# Patient Record
Sex: Male | Born: 1960 | Hispanic: No | Marital: Married | Smoking: Former smoker
Health system: Southern US, Community
[De-identification: ages and names within clinical notes are randomized; demographics above are authoritative.]

## PROBLEM LIST (undated history)

## (undated) DIAGNOSIS — I10 Essential (primary) hypertension: Secondary | ICD-10-CM

## (undated) DIAGNOSIS — E119 Type 2 diabetes mellitus without complications: Secondary | ICD-10-CM

## (undated) DIAGNOSIS — I2510 Atherosclerotic heart disease of native coronary artery without angina pectoris: Secondary | ICD-10-CM

## (undated) HISTORY — PX: BLADDER SURGERY: SHX569

## (undated) HISTORY — PX: BACK SURGERY: SHX140

## (undated) HISTORY — PX: CORONARY ANGIOPLASTY WITH STENT PLACEMENT: SHX49

---

## 2016-01-21 ENCOUNTER — Emergency Department (HOSPITAL_COMMUNITY): Payer: BLUE CROSS/BLUE SHIELD

## 2016-01-21 ENCOUNTER — Encounter (HOSPITAL_COMMUNITY): Payer: Self-pay | Admitting: Emergency Medicine

## 2016-01-21 ENCOUNTER — Emergency Department (HOSPITAL_COMMUNITY)
Admission: EM | Admit: 2016-01-21 | Discharge: 2016-01-22 | Disposition: A | Discharge status: Home / Self Care | Payer: BLUE CROSS/BLUE SHIELD | Attending: Emergency Medicine | Admitting: Emergency Medicine

## 2016-01-21 DIAGNOSIS — S93511A Sprain of interphalangeal joint of right great toe, initial encounter: Secondary | ICD-10-CM | POA: Insufficient documentation

## 2016-01-21 DIAGNOSIS — Y939 Activity, unspecified: Secondary | ICD-10-CM | POA: Diagnosis not present

## 2016-01-21 DIAGNOSIS — Y990 Civilian activity done for income or pay: Secondary | ICD-10-CM | POA: Insufficient documentation

## 2016-01-21 DIAGNOSIS — Z87891 Personal history of nicotine dependence: Secondary | ICD-10-CM | POA: Diagnosis not present

## 2016-01-21 DIAGNOSIS — Y929 Unspecified place or not applicable: Secondary | ICD-10-CM | POA: Diagnosis not present

## 2016-01-21 DIAGNOSIS — X501XXA Overexertion from prolonged static or awkward postures, initial encounter: Secondary | ICD-10-CM | POA: Diagnosis not present

## 2016-01-21 DIAGNOSIS — S99921A Unspecified injury of right foot, initial encounter: Secondary | ICD-10-CM | POA: Diagnosis present

## 2016-01-21 NOTE — ED Triage Notes (Signed)
Pt c/o pain at base of right great toe.  Onset last pm. Denies any injury

## 2016-01-21 NOTE — ED Provider Notes (Signed)
[facility] Provider Note   CSN: [id number] Arrival date & time: 01/21/16  1955  By signing my name below, I, Modena Jansky, attest that this documentation has been prepared under the direction and in the presence of non-physician practitioner, Kerrie Buffalo, NP. Electronically Signed: Modena Jansky, Scribe. 01/21/2016. 11:23 PM.  History   Chief Complaint Chief Complaint  Patient presents with  . Toe Pain    Toe Pain  This is a new problem. The current episode started 12 to 24 hours ago. The problem occurs constantly. The problem has not changed since onset.Nothing aggravates the symptoms. The symptoms are relieved by acetaminophen. He has tried acetaminophen for the symptoms. The treatment provided mild relief.    HPI Comments: Anthony Huber is a 56 y.o. male who presents to the Emergency Department complaining of constant moderate right big toe pain that started last night. He states he bent his big toe at work. His pain is relieved by advil (last dose: noon) and exacerbated by movement. No associated symptoms. He admits to a prior hx of similar complaint due to injury as a child. He denies any leg swelling or other complaints.   History reviewed. No pertinent past medical history.  There are no active problems to display for this patient.   Past Surgical History:  Procedure Laterality Date  . BLADDER SURGERY         Home Medications    Prior to Admission medications   Medication Sig Start Date End Date Taking? Authorizing Provider  diclofenac (VOLTAREN) 50 MG EC tablet Take 1 tablet (50 mg total) by mouth 2 (two) times daily. 01/22/16   Lazarus Sudbury Orlene Och, NP    Family History No family history on file.  Social History Social History  Substance Use Topics  . Smoking status: Former Games developer  . Smokeless tobacco: Never Used  . Alcohol use No     Allergies   Patient has no allergy information on record.   Review of Systems Review of Systems  Constitutional: Negative  for fever.  Cardiovascular: Negative for leg swelling.  Musculoskeletal: Positive for arthralgias, joint swelling and myalgias.     Physical Exam Updated Vital Signs BP (!) 172/103 (BP Location: Left Arm)   Pulse 106   Temp 97.8 F (36.6 C) (Oral)   Resp 18   Ht 4\' 11"  (1.499 m)   Wt 106 lb 3 oz (48.2 kg)   SpO2 96%   BMI 21.45 kg/m   Physical Exam  Constitutional: He appears well-developed and well-nourished. No distress.  HENT:  Head: Normocephalic and atraumatic.  Eyes: Conjunctivae are normal.  Neck: Neck supple.  Cardiovascular: Normal rate.   Pedal pulses are +2. Adequate circulation.   Pulmonary/Chest: Effort normal.  Abdominal: Soft.  Musculoskeletal: Normal range of motion.       Right foot: There is tenderness. There is no swelling, normal capillary refill, no deformity and no laceration. Decreased range of motion: due to pain.       Feet:  Pedal pulse 2+, adequate circulation  Neurological: He is alert.  Skin: Skin is warm and dry.  Psychiatric: He has a normal mood and affect.  Nursing note and vitals reviewed.    ED Treatments / Results  DIAGNOSTIC STUDIES: Oxygen Saturation is 96% on RA, normal by my interpretation.    COORDINATION OF CARE: 11:26 PM- Pt advised of plan for treatment and pt agrees.  Labs (all labs ordered are listed, but only abnormal results are displayed) Labs Reviewed - No  data to display Procedures Procedures (including critical care time)  Medications Ordered in ED Medications - No data to display   Initial Impression / Assessment and Plan / ED Course  I have reviewed the triage vital signs and the nursing notes.  Pertinent labs & imaging results that were available during my care of the patient were reviewed by me and considered in my medical decision making (see chart for details).    Patient X-Ray negative for obvious fracture or dislocation. Pain managed in ED. Pt advised to follow up with orthopedics if symptoms  persist for possibility of missed fracture diagnosis. Patient given brace while in ED, conservative therapy recommended and discussed. Patient will be dc home & is agreeable with above plan.  Final Clinical Impressions(s) / ED Diagnoses   Final diagnoses:  Sprain of interphalangeal joint of right great toe, initial encounter    New Prescriptions Discharge Medication List as of 01/22/2016 12:35 AM    START taking these medications   Details  diclofenac (VOLTAREN) 50 MG EC tablet Take 1 tablet (50 mg total) by mouth 2 (two) times daily., Starting Wed 01/22/2016, Print      I personally performed the services described in this documentation, which was scribed in my presence. The recorded information has been reviewed and is accurate.     [address]Yana Schorr [place] Yannis Broce, NP 01/25/16 [phone]    Gilda Creasehristopher J Pollina, MD 02/01/16 [phone]

## 2016-01-21 NOTE — ED Notes (Signed)
NP at bedside.

## 2016-01-21 NOTE — ED Notes (Signed)
Patient transported to X-ray 

## 2016-01-22 MED ORDER — DICLOFENAC SODIUM 50 MG PO TBEC: 50.0000 mg | DELAYED_RELEASE_TABLET | Freq: Two times a day (BID) | ORAL | 0 refills | Status: DC

## 2016-01-22 NOTE — ED Notes (Signed)
Pt back in room. No distress observed. 

## 2016-01-22 NOTE — ED Notes (Signed)
Ortho paged. 

## 2016-01-22 NOTE — Discharge Instructions (Signed)
Your x-ray shows not broken bone in your toe or foot. You have most likely sprained the toe when your were bending and the to pressed back. Take the medication as directed for pain and inflammation. Return as needed for worsening symptoms

## 2016-09-05 ENCOUNTER — Encounter (HOSPITAL_COMMUNITY)
Admission: EM | Disposition: A | Discharge status: Home / Self Care | Payer: Self-pay | Source: Home / Self Care | Attending: Urology

## 2016-09-05 ENCOUNTER — Observation Stay (HOSPITAL_COMMUNITY): Payer: BLUE CROSS/BLUE SHIELD | Admitting: Certified Registered"

## 2016-09-05 ENCOUNTER — Inpatient Hospital Stay (HOSPITAL_COMMUNITY)
Admission: EM | Admit: 2016-09-05 | Discharge: 2016-09-09 | DRG: 872 | Disposition: A | Discharge status: Home / Self Care | Payer: BLUE CROSS/BLUE SHIELD | Attending: Urology | Admitting: Urology

## 2016-09-05 ENCOUNTER — Emergency Department (HOSPITAL_COMMUNITY): Payer: BLUE CROSS/BLUE SHIELD

## 2016-09-05 ENCOUNTER — Observation Stay (HOSPITAL_COMMUNITY): Payer: BLUE CROSS/BLUE SHIELD

## 2016-09-05 ENCOUNTER — Encounter (HOSPITAL_COMMUNITY): Payer: Self-pay | Admitting: *Deleted

## 2016-09-05 DIAGNOSIS — E119 Type 2 diabetes mellitus without complications: Secondary | ICD-10-CM | POA: Diagnosis present

## 2016-09-05 DIAGNOSIS — I10 Essential (primary) hypertension: Secondary | ICD-10-CM | POA: Diagnosis present

## 2016-09-05 DIAGNOSIS — I2510 Atherosclerotic heart disease of native coronary artery without angina pectoris: Secondary | ICD-10-CM | POA: Diagnosis present

## 2016-09-05 DIAGNOSIS — Z955 Presence of coronary angioplasty implant and graft: Secondary | ICD-10-CM

## 2016-09-05 DIAGNOSIS — Z79899 Other long term (current) drug therapy: Secondary | ICD-10-CM

## 2016-09-05 DIAGNOSIS — A419 Sepsis, unspecified organism: Secondary | ICD-10-CM | POA: Diagnosis not present

## 2016-09-05 DIAGNOSIS — N136 Pyonephrosis: Secondary | ICD-10-CM | POA: Diagnosis present

## 2016-09-05 DIAGNOSIS — N200 Calculus of kidney: Secondary | ICD-10-CM

## 2016-09-05 DIAGNOSIS — Z87442 Personal history of urinary calculi: Secondary | ICD-10-CM

## 2016-09-05 HISTORY — DX: Type 2 diabetes mellitus without complications: E11.9

## 2016-09-05 HISTORY — PX: CYSTOSCOPY WITH STENT PLACEMENT: SHX5790

## 2016-09-05 HISTORY — DX: Essential (primary) hypertension: I10

## 2016-09-05 HISTORY — DX: Atherosclerotic heart disease of native coronary artery without angina pectoris: I25.10

## 2016-09-05 LAB — CBC WITH DIFFERENTIAL/PLATELET
Basophils Absolute: 0 10*3/uL (ref 0.0–0.1)
Basophils Relative: 0 %
EOS PCT: 5 %
Eosinophils Absolute: 0.7 10*3/uL (ref 0.0–0.7)
HEMATOCRIT: 34.7 % — AB (ref 39.0–52.0)
Hemoglobin: 11.5 g/dL — ABNORMAL LOW (ref 13.0–17.0)
LYMPHS ABS: 1.5 10*3/uL (ref 0.7–4.0)
LYMPHS PCT: 11 %
MCH: 21.3 pg — AB (ref 26.0–34.0)
MCHC: 33.1 g/dL (ref 30.0–36.0)
MCV: 64.1 fL — AB (ref 78.0–100.0)
Monocytes Absolute: 1.3 10*3/uL — ABNORMAL HIGH (ref 0.1–1.0)
Monocytes Relative: 10 %
Neutro Abs: 10 10*3/uL — ABNORMAL HIGH (ref 1.7–7.7)
Neutrophils Relative %: 74 %
PLATELETS: 524 10*3/uL — AB (ref 150–400)
RBC: 5.41 MIL/uL (ref 4.22–5.81)
RDW: 15.4 % (ref 11.5–15.5)
WBC: 13.5 10*3/uL — AB (ref 4.0–10.5)

## 2016-09-05 LAB — URINALYSIS, ROUTINE W REFLEX MICROSCOPIC
Bilirubin Urine: NEGATIVE
Glucose, UA: NEGATIVE mg/dL
Ketones, ur: NEGATIVE mg/dL
Nitrite: NEGATIVE
PH: 6 (ref 5.0–8.0)
Protein, ur: NEGATIVE mg/dL
SPECIFIC GRAVITY, URINE: 1.01 (ref 1.005–1.030)
SQUAMOUS EPITHELIAL / LPF: NONE SEEN

## 2016-09-05 LAB — COMPREHENSIVE METABOLIC PANEL
ALK PHOS: 74 U/L (ref 38–126)
ALT: 11 U/L — AB (ref 17–63)
AST: 33 U/L (ref 15–41)
Albumin: 2.7 g/dL — ABNORMAL LOW (ref 3.5–5.0)
Anion gap: 10 (ref 5–15)
BILIRUBIN TOTAL: 0.4 mg/dL (ref 0.3–1.2)
BUN: 12 mg/dL (ref 6–20)
CALCIUM: 8.8 mg/dL — AB (ref 8.9–10.3)
CO2: 23 mmol/L (ref 22–32)
CREATININE: 1.43 mg/dL — AB (ref 0.61–1.24)
Chloride: 102 mmol/L (ref 101–111)
GFR, EST NON AFRICAN AMERICAN: 53 mL/min — AB (ref >60)
Glucose, Bld: 127 mg/dL — ABNORMAL HIGH (ref 65–99)
Potassium: 4.2 mmol/L (ref 3.5–5.1)
Sodium: 135 mmol/L (ref 135–145)
TOTAL PROTEIN: 8.3 g/dL — AB (ref 6.5–8.1)

## 2016-09-05 LAB — GLUCOSE, CAPILLARY: GLUCOSE-CAPILLARY: 119 mg/dL — AB (ref 65–99)

## 2016-09-05 LAB — I-STAT CG4 LACTIC ACID, ED: LACTIC ACID, VENOUS: 1.15 mmol/L (ref 0.5–1.9)

## 2016-09-05 SURGERY — CYSTOSCOPY, WITH STENT INSERTION
Anesthesia: General | Laterality: Left

## 2016-09-05 MED ORDER — CIPROFLOXACIN HCL 500 MG PO TABS: 500.0000 mg | ORAL_TABLET | Freq: Two times a day (BID) | ORAL | 0 refills | Status: DC

## 2016-09-05 MED ORDER — VANCOMYCIN HCL IN DEXTROSE 750-5 MG/150ML-% IV SOLN
750.0000 mg | INTRAVENOUS | Status: DC
  Administered 2016-09-06 – 2016-09-07 (×2): 750 mg via INTRAVENOUS
  Filled 2016-09-05 (×3): qty 150

## 2016-09-05 MED ORDER — SODIUM CHLORIDE 0.9 % IV BOLUS (SEPSIS)
1000.0000 mL | Freq: Once | INTRAVENOUS | Status: AC
  Administered 2016-09-05: 1000 mL via INTRAVENOUS

## 2016-09-05 MED ORDER — TRAMADOL HCL 50 MG PO TABS: 50.0000 mg | ORAL_TABLET | Freq: Four times a day (QID) | ORAL | 0 refills | Status: DC | PRN

## 2016-09-05 MED ORDER — PROPOFOL 10 MG/ML IV BOLUS
INTRAVENOUS | Status: DC | PRN
  Administered 2016-09-05: 100 mg via INTRAVENOUS

## 2016-09-05 MED ORDER — PIPERACILLIN-TAZOBACTAM 3.375 G IVPB
3.3750 g | INTRAVENOUS | Status: AC
  Administered 2016-09-05: 3.375 g via INTRAVENOUS
  Filled 2016-09-05: qty 50

## 2016-09-05 MED ORDER — SODIUM CHLORIDE 0.9 % IV SOLN
INTRAVENOUS | Status: DC | PRN
  Administered 2016-09-05: 30 mL

## 2016-09-05 MED ORDER — DEXAMETHASONE SODIUM PHOSPHATE 10 MG/ML IJ SOLN
INTRAMUSCULAR | Status: DC | PRN
  Administered 2016-09-05: 5 mg via INTRAVENOUS

## 2016-09-05 MED ORDER — FENTANYL CITRATE (PF) 100 MCG/2ML IJ SOLN: 25.0000 ug | INTRAMUSCULAR | Status: DC | PRN

## 2016-09-05 MED ORDER — PHENYLEPHRINE 40 MCG/ML (10ML) SYRINGE FOR IV PUSH (FOR BLOOD PRESSURE SUPPORT)
PREFILLED_SYRINGE | INTRAVENOUS | Status: DC | PRN
  Administered 2016-09-05 (×2): 80 ug via INTRAVENOUS
  Administered 2016-09-05: 120 ug via INTRAVENOUS
  Administered 2016-09-05 (×4): 80 ug via INTRAVENOUS

## 2016-09-05 MED ORDER — OXYCODONE HCL 5 MG/5ML PO SOLN
5.0000 mg | Freq: Once | ORAL | Status: DC | PRN
  Filled 2016-09-05: qty 5

## 2016-09-05 MED ORDER — LIDOCAINE 2% (20 MG/ML) 5 ML SYRINGE
INTRAMUSCULAR | Status: DC | PRN
  Administered 2016-09-05: 60 mg via INTRAVENOUS

## 2016-09-05 MED ORDER — IOPAMIDOL (ISOVUE-300) INJECTION 61%
INTRAVENOUS | Status: AC
  Filled 2016-09-05: qty 50

## 2016-09-05 MED ORDER — FENTANYL CITRATE (PF) 100 MCG/2ML IJ SOLN
INTRAMUSCULAR | Status: DC | PRN
  Administered 2016-09-05 (×4): 25 ug via INTRAVENOUS

## 2016-09-05 MED ORDER — MIDAZOLAM HCL 2 MG/2ML IJ SOLN
INTRAMUSCULAR | Status: DC | PRN
  Administered 2016-09-05 (×2): 1 mg via INTRAVENOUS

## 2016-09-05 MED ORDER — PIPERACILLIN-TAZOBACTAM 3.375 G IVPB
3.3750 g | Freq: Three times a day (TID) | INTRAVENOUS | Status: DC
  Administered 2016-09-06 – 2016-09-08 (×7): 3.375 g via INTRAVENOUS
  Filled 2016-09-05 (×7): qty 50

## 2016-09-05 MED ORDER — ONDANSETRON HCL 4 MG/2ML IJ SOLN
INTRAMUSCULAR | Status: DC | PRN
  Administered 2016-09-05: 4 mg via INTRAVENOUS

## 2016-09-05 MED ORDER — ONDANSETRON HCL 4 MG/2ML IJ SOLN: 4.0000 mg | Freq: Four times a day (QID) | INTRAMUSCULAR | Status: DC | PRN

## 2016-09-05 MED ORDER — LACTATED RINGERS IV SOLN
INTRAVENOUS | Status: DC | PRN
  Administered 2016-09-05: 22:00:00 via INTRAVENOUS

## 2016-09-05 MED ORDER — STERILE WATER FOR IRRIGATION IR SOLN
Status: DC | PRN
  Administered 2016-09-05: 1000 mL
  Administered 2016-09-05: 3000 mL
  Administered 2016-09-05: 1000 mL

## 2016-09-05 MED ORDER — SODIUM CHLORIDE 0.9 % IV SOLN
1000.0000 mL | INTRAVENOUS | Status: DC
  Administered 2016-09-05: 1000 mL via INTRAVENOUS

## 2016-09-05 MED ORDER — VANCOMYCIN HCL IN DEXTROSE 1-5 GM/200ML-% IV SOLN: 1000.0000 mg | INTRAVENOUS | Status: DC

## 2016-09-05 MED ORDER — OXYCODONE HCL 5 MG PO TABS: 5.0000 mg | ORAL_TABLET | Freq: Once | ORAL | Status: DC | PRN

## 2016-09-05 MED ORDER — VANCOMYCIN HCL IN DEXTROSE 1-5 GM/200ML-% IV SOLN
1000.0000 mg | Freq: Once | INTRAVENOUS | Status: AC
  Administered 2016-09-05: 1000 mg via INTRAVENOUS
  Filled 2016-09-05: qty 200

## 2016-09-05 SURGICAL SUPPLY — 15 items
BAG URO CATCHER STRL LF (MISCELLANEOUS) ×3 IMPLANT
CATH INTERMIT  6FR 70CM (CATHETERS) ×3 IMPLANT
CATH URET 5FR 28IN OPEN ENDED (CATHETERS) ×3 IMPLANT
CLOTH BEACON ORANGE TIMEOUT ST (SAFETY) ×3 IMPLANT
COVER FOOTSWITCH UNIV (MISCELLANEOUS) IMPLANT
COVER SURGICAL LIGHT HANDLE (MISCELLANEOUS) ×3 IMPLANT
GLOVE BIOGEL M STRL SZ7.5 (GLOVE) ×3 IMPLANT
GOWN STRL REUS W/TWL LRG LVL3 (GOWN DISPOSABLE) ×6 IMPLANT
GUIDEWIRE STR DUAL SENSOR (WIRE) ×3 IMPLANT
MANIFOLD NEPTUNE II (INSTRUMENTS) ×3 IMPLANT
PACK CYSTO (CUSTOM PROCEDURE TRAY) ×3 IMPLANT
STENT URET 6FRX24 CONTOUR (STENTS) ×3 IMPLANT
TRAY FOLEY W/METER SILVER 16FR (SET/KITS/TRAYS/PACK) ×3 IMPLANT
TUBING CONNECTING 10 (TUBING) IMPLANT
TUBING CONNECTING 10' (TUBING)

## 2016-09-05 NOTE — Anesthesia Preprocedure Evaluation (Addendum)
Anesthesia Evaluation  Patient identified by MRN, date of birth, ID band Patient awake    Reviewed: Allergy & Precautions, H&P , NPO status , Patient's Chart, lab work & pertinent test results  Airway Mallampati: II   Neck ROM: full    Dental  (+) Dental Advisory Given, Chipped, Poor Dentition   Pulmonary former smoker,    breath sounds clear to auscultation       Cardiovascular hypertension,  Rhythm:regular Rate:Normal  Pt denies HTN and denies heart problems   Neuro/Psych    GI/Hepatic   Endo/Other  diabetesPt denies h/o DM  Renal/GU Renal diseaseH/o stones   H/o prostate surgery in [place]    Musculoskeletal   Abdominal   Peds  Hematology   Anesthesia Other Findings   Reproductive/Obstetrics                            Anesthesia Physical Anesthesia Plan  ASA: II  Anesthesia Plan: General   Post-op Pain Management:    Induction: Intravenous  PONV Risk Score and Plan: 2 and Ondansetron, Dexamethasone, Treatment may vary due to age or medical condition and Midazolam  Airway Management Planned: LMA  Additional Equipment:   Intra-op Plan:   Post-operative Plan:   Informed Consent: I have reviewed the patients History and Physical, chart, labs and discussed the procedure including the risks, benefits and alternatives for the proposed anesthesia with the patient or authorized representative who has indicated his/her understanding and acceptance.     Plan Discussed with: CRNA, Anesthesiologist and Surgeon  Anesthesia Plan Comments:         Anesthesia Quick Evaluation

## 2016-09-05 NOTE — ED Provider Notes (Signed)
[facility] Provider Note   CSN: [id number] Arrival date & time: 09/05/16  1210     History   Chief Complaint No chief complaint on file.   HPI Anthony Huber is a 56 y.o. male presenting with left-sided flank pain.  Level V caveat, as patient does not speak [place], and no interpreter available with this interpreter service. Son acting as a Nurse, learning disability.  For the past 2 weeks, patient has had left-sided flank pain. Last week, he became sick, with fevers chills and vomiting. He saw his primary care doctor and was put on antibiotics and sent for outpatient xray. Results showed a kidney stone. Patient was told to come the emergency room to get the stone removed. Son states he has a history of kidney stones requiring surgical extraction in [place]. Currently, patient denies fever, chills, nausea, or vomiting. He denies urinary symptoms including blood in the urine, dysuria, or increased frequency. He denies worsening pain with urination. He has been taking antibiotics as prescribed and naproxen for pain. The pain is constant, and naproxen makes it better. Nothing makes it worse. He denies chest pain, shortness of breath, cough, anterior abdominal pain, or abnormal bowel movements. He denies other medical history. Patient is not on blood thinners.  Patient last ate at 11:00 this morning.  HPI  Past Medical History:  Diagnosis Date  . Coronary artery disease   . Diabetes mellitus without complication (HCC)   . Hypertension     Patient Active Problem List   Diagnosis Date Noted  . Nephrolithiasis 09/05/2016    Past Surgical History:  Procedure Laterality Date  . BACK SURGERY    . BLADDER SURGERY    . CORONARY ANGIOPLASTY WITH STENT PLACEMENT         Home Medications    Prior to Admission medications   Medication Sig Start Date End Date Taking? Authorizing Provider  amoxicillin (AMOXIL) 500 MG capsule Take 1,000 mg by mouth 2 (two) times daily. 08/31/16  Yes [provider]  clarithromycin (BIAXIN) 500 MG tablet Take 500 mg by mouth 2 (two) times daily. 08/31/16  Yes [provider]  naproxen (NAPROSYN) 500 MG tablet Take 500 mg by mouth 2 (two) times daily as needed for pain. 08/26/16  Yes [provider]  omeprazole (PRILOSEC) 20 MG capsule Take 20 mg by mouth 2 (two) times daily. 08/31/16  Yes [provider]  pantoprazole (PROTONIX) 40 MG tablet Take 40 mg by mouth daily. 08/26/16  Yes [provider]  diclofenac (VOLTAREN) 50 MG EC tablet Take 1 tablet (50 mg total) by mouth 2 (two) times daily. Patient not taking: Reported on 09/05/2016 01/22/16   Janne Napoleon, NP    Family History No family history on file.  Social History Social History  Substance Use Topics  . Smoking status: Former Games developer  . Smokeless tobacco: Never Used  . Alcohol use No     Allergies   Patient has no active allergies.   Review of Systems Review of Systems  Unable to perform ROS: Other  Constitutional: Positive for fever (Resolved).  Gastrointestinal: Positive for vomiting (Resolved).  Genitourinary: Positive for flank pain.  All other systems reviewed and are negative. Further ROS unable to be performed due to language barrier.   Physical Exam Updated Vital Signs BP 139/89   Pulse 87   Temp 98.3 F (36.8 C) (Oral)   Resp 14   Ht 4\' 11"  (1.499 m)   Wt 44.3 kg (97 lb 9.6 oz)  SpO2 100%   BMI 19.71 kg/m   Physical Exam  Constitutional: He is oriented to person, place, and time. He appears well-developed and well-nourished. No distress.  HENT:  Head: Normocephalic and atraumatic.  Eyes: EOM are normal.  Neck: Normal range of motion.  Cardiovascular: Normal rate, regular rhythm and intact distal pulses.   Pulmonary/Chest: Effort normal and breath sounds normal. No respiratory distress. He has no decreased breath sounds. He has no wheezes. He has no rhonchi. He has no rales.  Abdominal: Soft. Bowel sounds are  normal. He exhibits no distension. There is tenderness. There is no rigidity, no rebound, no guarding, no CVA tenderness, no tenderness at McBurney's point and negative Murphy's sign.  Tenderness to palpation of the left side. 4" x 2" area and erythema and warmth. No fluctuance. Erythema is superimposing site of old surgical scars.  Musculoskeletal: Normal range of motion.  Neurological: He is alert and oriented to person, place, and time.  Skin: Skin is warm and dry. He is not diaphoretic.  Psychiatric: He has a normal mood and affect.  Nursing note and vitals reviewed.    ED Treatments / Results  Labs (all labs ordered are listed, but only abnormal results are displayed) Labs Reviewed  URINALYSIS, ROUTINE W REFLEX MICROSCOPIC - Abnormal; Notable for the following:       Result Value   APPearance CLOUDY (*)    Hgb urine dipstick SMALL (*)    Leukocytes, UA LARGE (*)    Bacteria, UA RARE (*)    All other components within normal limits  CBC WITH DIFFERENTIAL/PLATELET - Abnormal; Notable for the following:    WBC 13.5 (*)    Hemoglobin 11.5 (*)    HCT 34.7 (*)    MCV 64.1 (*)    MCH 21.3 (*)    Platelets 524 (*)    Neutro Abs 10.0 (*)    Monocytes Absolute 1.3 (*)    All other components within normal limits  COMPREHENSIVE METABOLIC PANEL - Abnormal; Notable for the following:    Glucose, Bld 127 (*)    Creatinine, Ser 1.43 (*)    Calcium 8.8 (*)    Total Protein 8.3 (*)    Albumin 2.7 (*)    ALT 11 (*)    GFR calc non Af Amer 53 (*)    All other components within normal limits  CULTURE, BLOOD (ROUTINE X 2)  CULTURE, BLOOD (ROUTINE X 2)  URINE CULTURE  I-STAT CG4 LACTIC ACID, ED    EKG  EKG Interpretation None       Radiology Ct Renal Stone Study  Result Date: 09/05/2016 CLINICAL DATA:  Flank pain.  Stone disease suspected. EXAM: CT ABDOMEN AND PELVIS WITHOUT CONTRAST TECHNIQUE: Multidetector CT imaging of the abdomen and pelvis was performed following the  standard protocol without IV contrast. COMPARISON:  None. FINDINGS: Motion degraded scan. Lower chest: Mild scattered varicoid bronchiectasis at both lung bases. Hepatobiliary: Normal liver with no liver mass. Normal gallbladder with no radiopaque cholelithiasis. No biliary ductal dilatation. Pancreas: Normal, with no mass or duct dilation. Spleen: Normal size. No mass. Adrenals/Urinary Tract: Normal adrenals. Obstructing 20 x 9 mm distal left ureteral stone just above the left ureterovesical junction with severe left hydroureteronephrosis. Nonobstructing 8 mm and 2 mm lower left renal stones. Severe diffuse left renal parenchymal atrophy. No right hydronephrosis. Nonobstructing 2 mm lower right renal stone. Normal caliber right ureter, with no right ureteral stones. No contour deforming renal masses. Normal bladder. Stomach/Bowel: Grossly normal  stomach. Normal caliber small bowel with no small bowel wall thickening. Normal appendix. Normal large bowel with no diverticulosis, large bowel wall thickening or pericolonic fat stranding. Vascular/Lymphatic: Atherosclerotic nonaneurysmal abdominal aorta. No pathologically enlarged lymph nodes in the abdomen or pelvis. Reproductive: Normal size prostate with coarse nonspecific internal prostatic calcifications. Other: No pneumoperitoneum, ascites or focal fluid collection. Musculoskeletal: No aggressive appearing focal osseous lesions. Mild thoracolumbar spondylosis. Incompletely healed chronic appearing deformities of the pubic symphysis bilaterally. IMPRESSION: 1. Obstructing 20 x 9 mm distal left ureteral stone just above the left UVJ, with severe left hydroureteronephrosis. This is probably a chronic obstruction given severe diffuse asymmetric left renal parenchymal atrophy . 2. Additional nonobstructing stones in both kidneys. 3. 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 4.  Aortic Atherosclerosis (ICD10-I70.0). Electronically Signed   By: Delbert Phenix M.D.   On: 09/05/2016 16:49    Procedures Procedures (including critical care time)  Medications Ordered in ED Medications  sodium chloride 0.9 % bolus 1,000 mL (0 mLs Intravenous Stopped 09/05/16 1707)    Followed by  0.9 %  sodium chloride infusion (0 mLs Intravenous Stopped 09/05/16 1823)  iopamidol (ISOVUE-300) 61 % injection (not administered)  vancomycin (VANCOCIN) IVPB 1000 mg/200 mL premix (1,000 mg Intravenous New Bag/Given 09/05/16 1827)  piperacillin-tazobactam (ZOSYN) IVPB 3.375 g (not administered)  vancomycin (VANCOCIN) IVPB 750 mg/150 ml premix (not administered)  piperacillin-tazobactam (ZOSYN) IVPB 3.375 g (0 g Intravenous Stopped 09/05/16 1824)     Initial Impression / Assessment and Plan / ED Course  I have reviewed the triage vital signs and the nursing notes.  Pertinent labs & imaging results that were available during my care of the patient were reviewed by me and considered in my medical decision making (see chart for details).     Pt presenting with 2 weeks of left flank pain. History of kidney stones, and outpatient radiology stated the patient has a stone. Radiology was done in [place], and images are not in the system. Physical exam shows area of redness and tenderness of the left side, superimposing old surgical scars. Will order CBC, CMP, UA, blood cultures. CT renal ordered. Will start IV fluid.  UA shows positive leuks and too numerous to count white cells. Will send for urine culture. White count elevated at 13.5. Creatinine 1.4, no baseline to compare. CT showed 20 x 9 mm obstructing stone at the UVJ with severe left hydronephrosis. IV Vanc and Zosyn started. Discussed case with attending, Dr. Rosalia Hammers evaluated the patient. Will consult with urology.  Discussed case with Dr. Marlou Porch with urology. Patient to be admitted by urology to [facility] for further management.  Final Clinical Impressions(s) / ED Diagnoses   Final diagnoses:    Nephrolithiasis    New Prescriptions New Prescriptions   No medications on file     Alveria Apley, PA-C 09/05/16 1900    Margarita Grizzle, MD 09/05/16 [phone]

## 2016-09-05 NOTE — ED Notes (Signed)
Patient transported to CT 

## 2016-09-05 NOTE — Anesthesia Procedure Notes (Signed)
Date/Time: 09/05/2016 11:02 PM Performed by: Minerva EndsMIRARCHI, Daire Okimoto M Pre-anesthesia Checklist: Suction available, Emergency Drugs available, Patient identified and Patient being monitored Oxygen Delivery Method: Simple face mask Dental Injury: Teeth and Oropharynx as per pre-operative assessment  Comments: LMA removed OA inserted and face mask applied--- good AW to PACU O 2 intact

## 2016-09-05 NOTE — Consult Note (Signed)
I have been asked to see the patient by Dr. Antony Salmonaniela Ray, for evaluation and management of left distal ureteral stone.  History of present illness: 89M who presented to the ED with left sided flank pain.  He had no nausea.  He denies any fevers.  He has been having flank pain for the last two weeks.  Seen by his PCP and started on abx for fever and concern of a UTI.  He has not had any voiding symptoms.  He has a history of nephrolithiasis.  He has had two operation on his left kidney to remove stones.  He has also had a prostate surgery.  In the ED he was noted to have UA with large WBCs, his serum WBC was 13K and his CT scan showed a 2cm x 0.9cm stone in the left distal ureter.  His kidney was dilated  Review of systems: A 12 point comprehensive review of systems was obtained and is negative unless otherwise stated in the history of present illness.  Patient Active Problem List   Diagnosis Date Noted  . Nephrolithiasis 09/05/2016    No current facility-administered medications on file prior to encounter.    Current Outpatient Prescriptions on File Prior to Encounter  Medication Sig Dispense Refill  . diclofenac (VOLTAREN) 50 MG EC tablet Take 1 tablet (50 mg total) by mouth 2 (two) times daily. (Patient not taking: Reported on 09/05/2016) 15 tablet 0    Past Medical History:  Diagnosis Date  . Coronary artery disease   . Diabetes mellitus without complication (HCC)   . Hypertension     Past Surgical History:  Procedure Laterality Date  . BACK SURGERY    . BLADDER SURGERY    . CORONARY ANGIOPLASTY WITH STENT PLACEMENT      Social History  Substance Use Topics  . Smoking status: Former Games developermoker  . Smokeless tobacco: Never Used  . Alcohol use No    History reviewed. No pertinent family history.  PE: Vitals:   09/05/16 1800 09/05/16 1915 09/05/16 2030 09/05/16 2042  BP: 139/89 125/77 133/86 138/86  Pulse: 87 80 83 86  Resp: 14   16  Temp:      TempSrc:      SpO2: 100%  99% 99% 100%  Weight:      Height:       Patient appears to be in no acute distress  patient is alert and oriented x3 Atraumatic normocephalic head No cervical or supraclavicular lymphadenopathy appreciated No increased work of breathing, no audible wheezes/rhonchi Regular sinus rhythm/rate Abdomen is soft, nontender, nondistended, left CVA tenderness Lower extremities are symmetric without appreciable edema Grossly neurologically intact No identifiable skin lesions   Recent Labs  09/05/16 1600  WBC 13.5*  HGB 11.5*  HCT 34.7*    Recent Labs  09/05/16 1600  NA 135  K 4.2  CL 102  CO2 23  GLUCOSE 127*  BUN 12  CREATININE 1.43*  CALCIUM 8.8*   No results for input(s): LABPT, INR in the last 72 hours. No results for input(s): LABURIN in the last 72 hours. No results found for this or any previous visit.  Imaging: I have independently reviewed the CT scan- this shows a large distal left sided stone with hydronephrosis.  His kidney looks chronically dilated and atrophied.  Imp: Left sided distal obstructing stone with UA concerning for possible infection. Recommendations: Plan to take patient to the OR for stent placement tonight.  He will then follow-up for ureteroscopy in a  few weeks.  It would also be worth a renogram to ascertain how function the left kidney is.  However, regardless I think his left distal ureteral stone needs to be removed.   Plan to admit after the surgery for observation and discharge in the AM if he remains stable.

## 2016-09-05 NOTE — ED Notes (Signed)
[facility] then room [id number],  Dr. Berniece SalinesBenjamin Herrick accepting

## 2016-09-05 NOTE — Anesthesia Procedure Notes (Signed)
Procedure Name: LMA Insertion Date/Time: 09/05/2016 9:54 PM Performed by: Minerva EndsMIRARCHI, Shahin Knierim M Pre-anesthesia Checklist: Patient identified, Emergency Drugs available, Suction available and Patient being monitored Patient Re-evaluated:Patient Re-evaluated prior to induction Oxygen Delivery Method: Circle System Utilized Preoxygenation: Pre-oxygenation with 100% oxygen Induction Type: IV induction Ventilation: Mask ventilation without difficulty LMA: LMA inserted LMA Size: 3.0 Tube type: Oral Number of attempts: 1 Placement Confirmation: positive ETCO2 Tube secured with: Tape Dental Injury: Teeth and Oropharynx as per pre-operative assessment  Comments: Smooth Iv induction Hodierene--- LMA insertion AM CRNA atraumatic--- teeth and mouth as preop-- any chipped teeth--- bilat BS

## 2016-09-05 NOTE — Progress Notes (Signed)
Pharmacy Antibiotic Note  Anthony Huber is a 89 y.oPietro Huber. male admitted on 09/05/2016 . Pharmacy has been consulted for Vancomycin dosing for UTI with kidney stones and Zosyn for inta-abdominal infection. WBC 13.5, ANC 10k. LA 1.15 , afebrile SCr 1.43, CrCl ~ 36 ml/min   Plan: Zosyn 3.375 g IV q8h , 4hr infusion Vancomycin 1g IV x1 then 750mg  IV q24h Monitor clinical status, renal function daily, f/u cx results if done.  Height: 4\' 11"  (149.9 cm) Weight: 97 lb 9.6 oz (44.3 kg) IBW/kg (Calculated) : 47.7 kg  Temp (24hrs), Avg:98.3 F (36.8 C), Min:98.3 F (36.8 C), Max:98.3 F (36.8 C)   Recent Labs Lab 09/05/16 1600 09/05/16 1742  WBC 13.5*  --   CREATININE 1.43*  --   LATICACIDVEN  --  1.15    Estimated Creatinine Clearance: 35.7 mL/min (A) (by C-G formula based on SCr of 1.43 mg/dL (H)).    No Active Allergies  Antimicrobials this admission: Vancomycin 9/1>> Zosyn 9/1>>  Dose adjustments this admission: n/a  Microbiology results: 9/1 BCx: sent 9/1 UCx: sent   Thank you for allowing pharmacy to be a part of this patient's care. Anthony Delaineuth Lateesha Huber, RPh Clinical Pharmacist Pager: [phone] 09/05/2016 6:09 PM

## 2016-09-05 NOTE — ED Notes (Signed)
Attempted report 

## 2016-09-05 NOTE — ED Notes (Signed)
All charting by this RN in this chart was incorrectly chart.

## 2016-09-05 NOTE — Op Note (Signed)
Preoperative diagnosis:  1. Left distal ureteral stone   Postoperative diagnosis:  1. Same 2. Infected left kidney Procedure:  1. Cystoscopy 2. left ureterscopy 3. left retrograde pyelography with interpretation   Surgeon: Ardis Hughs, MD  Anesthesia: General  Complications: None  Intraoperative findings:   #1 - large distal ureteral stone visible on fluoro #2 unable to advance a wire beyond the stone #3 ureteroscopy demonstrated a very tight/narrow UVJ (scarred) which I could not advance beyond #4 the stone was pushed back some and a retrograde pyelogram performed. #5 retrograde pyelogram demonstrated a large filling defect in the distal ureter as expected with a very narrow strictured intramural vesicle and distal ureter. The on the stone there was severe hydroureteronephrosis. The collecting system was very blunted. #6 after the retrograde pyelogram was performed a large amount of purulent efflux then began to discharge from the ureteral orifice.  EBL: Minimal  Specimens: urine culture  Indication: Anthony Huber is a 56 y.o. patient with left distal ureteral stone and concern for UTI. After reviewing the management options for treatment, he elected to proceed with the above surgical procedure(s). We have discussed the potential benefits and risks of the procedure, side effects of the proposed treatment, the likelihood of the patient achieving the goals of the procedure, and any potential problems that might occur during the procedure or recuperation. Informed consent has been obtained.  Description of procedure:  The patient was taken to the operating room and general anesthesia was induced.  The patient was placed in the dorsal lithotomy position, prepped and draped in the usual sterile fashion, and preoperative antibiotics were administered prior to the patient being brought back to the OR. A preoperative time-out was performed.   A 21 French 30 cystoscope was gently  passed into the patient's urethra into the bladder and visual guidance.The prostate was noted to be moderately obstructive. The bladder was normal appearing with orthotopic ureters. I then advanced a 5 [place] open-ended ureteral catheter through the left ureteral orifice was unable to advance it beyond the intravesical ureter. IT appeared to pass a safety wire through the open-ended catheter and met resistance at the stone. I then attempted to advance a Glidewire through the open-ended catheter and into theproximal ureter unsuccessfully, again not being up to navigate much beyond the stone. At this point I opted to use a 4/6 French semirigid ureteroscope and cannulated the left ureteral orifice. I was unable to advance the scope beyond the intravesical ureter. I did advance a wire through the scope and into the ureteral lumen, again unable to advance it beyond the stone. I then again removed this scope and advance the open-ended catheter over the wire to the physical/intravesical ureter. At this point I was able to perform a retrograde pyelogram with the above findings. Upon removing the open-ended catheter a significant amount of purulent drainage effluxed.  After several minutes and a specimen had been collected I again attempted to advance wires beyond the stone unsuccessfully.  Having been unable to advance a wire beyond the stone, we opted to stop the procedure and place Foley catheter.  The patient will be admitted to the hospital, we will consult interventional radiology tomorrow in the morning for placement of a left nephrostomy tube.  Cystourethroscopy was performed. Ardis Hughs, M.D.

## 2016-09-05 NOTE — ED Notes (Signed)
[facility] contacted for tx to [facility], then room [id number],  Dr. Benjamin Herrick accepting 

## 2016-09-05 NOTE — Transfer of Care (Signed)
Immediate Anesthesia Transfer of Care Note  Patient: Anthony Huber  Procedure(s) Performed: Procedure(s): CYSTOSCOPY, URETEROSCOPY,  WITH STENT PLACEMENT (Left)  Patient Location: PACU  Anesthesia Type:General  Level of Consciousness: sedated  Airway & Oxygen Therapy: Patient Spontanous Breathing and Patient connected to face mask oxygen  Post-op Assessment: Report given to RN and Post -op Vital signs reviewed and stable  Post vital signs: Reviewed and stable  Last Vitals:  Vitals:   09/05/16 2042 09/05/16 2314  BP: 138/86   Pulse: 86   Resp: 16   Temp:  36.6 C  SpO2: 100%     Last Pain:  Vitals:   09/05/16 1809  TempSrc:   PainSc: 3          Complications: No apparent anesthesia complications

## 2016-09-06 ENCOUNTER — Encounter (HOSPITAL_COMMUNITY): Payer: Self-pay | Admitting: Interventional Radiology

## 2016-09-06 ENCOUNTER — Observation Stay (HOSPITAL_COMMUNITY): Payer: BLUE CROSS/BLUE SHIELD

## 2016-09-06 DIAGNOSIS — I10 Essential (primary) hypertension: Secondary | ICD-10-CM | POA: Diagnosis present

## 2016-09-06 DIAGNOSIS — Z955 Presence of coronary angioplasty implant and graft: Secondary | ICD-10-CM | POA: Diagnosis not present

## 2016-09-06 DIAGNOSIS — A419 Sepsis, unspecified organism: Secondary | ICD-10-CM | POA: Diagnosis present

## 2016-09-06 DIAGNOSIS — E119 Type 2 diabetes mellitus without complications: Secondary | ICD-10-CM | POA: Diagnosis present

## 2016-09-06 DIAGNOSIS — I2510 Atherosclerotic heart disease of native coronary artery without angina pectoris: Secondary | ICD-10-CM | POA: Diagnosis present

## 2016-09-06 DIAGNOSIS — N136 Pyonephrosis: Secondary | ICD-10-CM | POA: Diagnosis present

## 2016-09-06 DIAGNOSIS — N200 Calculus of kidney: Secondary | ICD-10-CM | POA: Diagnosis present

## 2016-09-06 DIAGNOSIS — Z79899 Other long term (current) drug therapy: Secondary | ICD-10-CM | POA: Diagnosis not present

## 2016-09-06 DIAGNOSIS — Z87442 Personal history of urinary calculi: Secondary | ICD-10-CM | POA: Diagnosis not present

## 2016-09-06 HISTORY — PX: IR NEPHROSTOMY PLACEMENT LEFT: IMG6063

## 2016-09-06 LAB — BASIC METABOLIC PANEL
Anion gap: 10 (ref 5–15)
BUN: 16 mg/dL (ref 6–20)
CHLORIDE: 106 mmol/L (ref 101–111)
CO2: 23 mmol/L (ref 22–32)
CREATININE: 1.31 mg/dL — AB (ref 0.61–1.24)
Calcium: 8.5 mg/dL — ABNORMAL LOW (ref 8.9–10.3)
GFR calc Af Amer: >60 mL/min (ref >60)
GFR calc non Af Amer: 59 mL/min — ABNORMAL LOW (ref >60)
Glucose, Bld: 153 mg/dL — ABNORMAL HIGH (ref 65–99)
Potassium: 4.9 mmol/L (ref 3.5–5.1)
SODIUM: 139 mmol/L (ref 135–145)

## 2016-09-06 LAB — CBC
HEMATOCRIT: 35.2 % — AB (ref 39.0–52.0)
Hemoglobin: 11.6 g/dL — ABNORMAL LOW (ref 13.0–17.0)
MCH: 21.4 pg — ABNORMAL LOW (ref 26.0–34.0)
MCHC: 33 g/dL (ref 30.0–36.0)
MCV: 64.8 fL — AB (ref 78.0–100.0)
Platelets: 528 10*3/uL — ABNORMAL HIGH (ref 150–400)
RBC: 5.43 MIL/uL (ref 4.22–5.81)
RDW: 15.8 % — AB (ref 11.5–15.5)
WBC: 15.8 10*3/uL — AB (ref 4.0–10.5)

## 2016-09-06 LAB — HIV ANTIBODY (ROUTINE TESTING W REFLEX): HIV Screen 4th Generation wRfx: NONREACTIVE

## 2016-09-06 LAB — SURGICAL PCR SCREEN
MRSA, PCR: NEGATIVE
Staphylococcus aureus: NEGATIVE

## 2016-09-06 MED ORDER — IOPAMIDOL (ISOVUE-300) INJECTION 61%
10.0000 mL | Freq: Once | INTRAVENOUS | Status: AC | PRN
  Administered 2016-09-06: 10 mL

## 2016-09-06 MED ORDER — MIDAZOLAM HCL 2 MG/2ML IJ SOLN
INTRAMUSCULAR | Status: AC | PRN
  Administered 2016-09-06 (×3): 1 mg via INTRAVENOUS

## 2016-09-06 MED ORDER — FENTANYL CITRATE (PF) 100 MCG/2ML IJ SOLN
INTRAMUSCULAR | Status: AC | PRN
  Administered 2016-09-06 (×2): 25 ug via INTRAVENOUS

## 2016-09-06 MED ORDER — MIDAZOLAM HCL 2 MG/2ML IJ SOLN
INTRAMUSCULAR | Status: AC
  Filled 2016-09-06: qty 4

## 2016-09-06 MED ORDER — IOPAMIDOL (ISOVUE-300) INJECTION 61%
INTRAVENOUS | Status: AC
  Filled 2016-09-06: qty 50

## 2016-09-06 MED ORDER — FENTANYL CITRATE (PF) 100 MCG/2ML IJ SOLN
INTRAMUSCULAR | Status: AC
  Filled 2016-09-06: qty 2

## 2016-09-06 MED ORDER — LIDOCAINE HCL (PF) 1 % IJ SOLN
INTRAMUSCULAR | Status: AC
  Filled 2016-09-06: qty 30

## 2016-09-06 MED ORDER — HYDROMORPHONE HCL-NACL 0.5-0.9 MG/ML-% IV SOSY: 0.5000 mg | PREFILLED_SYRINGE | INTRAVENOUS | Status: DC | PRN

## 2016-09-06 MED ORDER — SODIUM CHLORIDE 0.9 % IV SOLN
INTRAVENOUS | Status: DC
  Administered 2016-09-06 (×2): via INTRAVENOUS

## 2016-09-06 MED ORDER — LIDOCAINE HCL (PF) 1 % IJ SOLN
INTRAMUSCULAR | Status: AC | PRN
  Administered 2016-09-06: 5 mL

## 2016-09-06 NOTE — Consult Note (Signed)
Chief Complaint: Patient was seen in consultation today for left hydronephrosis  Referring Physician(s):  Dr. [name]  Supervising Physician: Ruel FavorsShick, Trevor  [facility]  History of Present Illness: Anthony Huber is a 56 y.o. male with past medical history of CAD, DM, and HTN presents with flank pain.    CT Renal Stone Study 09/05/16: 1. Obstructing 20 x 9 mm distal left ureteral stone just above the left UVJ, with severe left hydroureteronephrosis. This is probably a chronic obstruction given severe diffuse asymmetric left renal parenchymal atrophy . 2. Additional nonobstructing stones in both kidneys. 3. 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 4.  Aortic Atherosclerosis (ICD10-I70.0).  Patient to OR overnight for stent placement, however stone obstructing and stent unable to be placed.  IR consulted for left percutaneous nephrostomy tube at the request of Dr. [name].  Patient has been NPO.  He does not take blood thinners.   Patient speaks a native, tribal dialect of [place] as well as some [place].  Son available at bedside speaks [place].  Assessment performed with assistance of son and video interpreter.   Past Medical History:  Diagnosis Date  . Coronary artery disease   . Diabetes mellitus without complication (HCC)   . Hypertension     Past Surgical History:  Procedure Laterality Date  . BACK SURGERY    . BLADDER SURGERY    . CORONARY ANGIOPLASTY WITH STENT PLACEMENT      Allergies: Patient has no active allergies.  Medications: Prior to Admission medications   Medication Sig Start Date End Date Taking? Authorizing Provider  amoxicillin (AMOXIL) 500 MG capsule Take 1,000 mg by mouth 2 (two) times daily. 08/31/16  Yes [provider]  clarithromycin (BIAXIN) 500 MG tablet Take 500 mg by mouth 2 (two) times daily. 08/31/16  Yes [provider]  naproxen (NAPROSYN) 500 MG tablet Take 500 mg by mouth 2  (two) times daily as needed for pain. 08/26/16  Yes [provider]  omeprazole (PRILOSEC) 20 MG capsule Take 20 mg by mouth 2 (two) times daily. 08/31/16  Yes [provider]  pantoprazole (PROTONIX) 40 MG tablet Take 40 mg by mouth daily. 08/26/16  Yes [provider]  ciprofloxacin (CIPRO) 500 MG tablet Take 1 tablet (500 mg total) by mouth 2 (two) times daily. 09/05/16   Crist FatHerrick, Benjamin W, MD  diclofenac (VOLTAREN) 50 MG EC tablet Take 1 tablet (50 mg total) by mouth 2 (two) times daily. Patient not taking: Reported on 09/05/2016 01/22/16   Janne NapoleonNeese, Hope M, NP  traMADol (ULTRAM) 50 MG tablet Take 1-2 tablets (50-100 mg total) by mouth every 6 (six) hours as needed for moderate pain. 09/05/16   Crist FatHerrick, Benjamin W, MD     History reviewed. No pertinent family history.  Social History   Social History  . Marital status: Married    Spouse name: N/A  . Number of children: N/A  . Years of education: N/A   Social History Main Topics  . Smoking status: Former Games developermoker  . Smokeless tobacco: Never Used  . Alcohol use No  . Drug use: No  . Sexual activity: Not Asked   Other Topics Concern  . None   Social History Narrative  . None    Review of Systems  Constitutional: Negative for fatigue and fever.  Respiratory: Negative for cough and shortness of breath.   Cardiovascular: Negative for chest pain.  Musculoskeletal: Positive for back pain.  Psychiatric/Behavioral: Negative for behavioral  problems and confusion.    Vital Signs: BP 124/84 (BP Location: Left Arm)   Pulse 75   Temp 100 F (37.8 C) (Oral)   Resp 16   Ht 4\' 11"  (1.499 m)   Wt 100 lb 5 oz (45.5 kg)   SpO2 100%   BMI 20.26 kg/m   Physical Exam  Constitutional: He is oriented to person, place, and time. He appears well-developed.  Cardiovascular: Normal rate, regular rhythm, normal heart sounds and intact distal pulses.   Pulmonary/Chest: Effort normal and breath sounds normal. No respiratory  distress.  Abdominal: Soft.  Neurological: He is alert and oriented to person, place, and time.  Skin: Skin is warm and dry.  Psychiatric: He has a normal mood and affect. His behavior is normal. Judgment and thought content normal.  Nursing note and vitals reviewed.   Mallampati Score:  MD Evaluation Airway: WNL Heart: WNL Abdomen: WNL Chest/ Lungs: WNL ASA  Classification: 3 Mallampati/Airway Score: Two  Imaging: Dg C-arm 1-60 Min-no Report  Result Date: 09/05/2016 Fluoroscopy was utilized by the requesting physician.  No radiographic interpretation.   Ct Renal Stone Study  Result Date: 09/05/2016 CLINICAL DATA:  Flank pain.  Stone disease suspected. EXAM: CT ABDOMEN AND PELVIS WITHOUT CONTRAST TECHNIQUE: Multidetector CT imaging of the abdomen and pelvis was performed following the standard protocol without IV contrast. COMPARISON:  None. FINDINGS: Motion degraded scan. Lower chest: Mild scattered varicoid bronchiectasis at both lung bases. Hepatobiliary: Normal liver with no liver mass. Normal gallbladder with no radiopaque cholelithiasis. No biliary ductal dilatation. Pancreas: Normal, with no mass or duct dilation. Spleen: Normal size. No mass. Adrenals/Urinary Tract: Normal adrenals. Obstructing 20 x 9 mm distal left ureteral stone just above the left ureterovesical junction with severe left hydroureteronephrosis. Nonobstructing 8 mm and 2 mm lower left renal stones. Severe diffuse left renal parenchymal atrophy. No right hydronephrosis. Nonobstructing 2 mm lower right renal stone. Normal caliber right ureter, with no right ureteral stones. No contour deforming renal masses. Normal bladder. Stomach/Bowel: Grossly normal stomach. Normal caliber small bowel with no small bowel wall thickening. Normal appendix. Normal large bowel with no diverticulosis, large bowel wall thickening or pericolonic fat stranding. Vascular/Lymphatic: Atherosclerotic nonaneurysmal abdominal aorta. No  pathologically enlarged lymph nodes in the abdomen or pelvis. Reproductive: Normal size prostate with coarse nonspecific internal prostatic calcifications. Other: No pneumoperitoneum, ascites or focal fluid collection. Musculoskeletal: No aggressive appearing focal osseous lesions. Mild thoracolumbar spondylosis. Incompletely healed chronic appearing deformities of the pubic symphysis bilaterally. IMPRESSION: 1. Obstructing 20 x 9 mm distal left ureteral stone just above the left UVJ, with severe left hydroureteronephrosis. This is probably a chronic obstruction given severe diffuse asymmetric left renal parenchymal atrophy . 2. Additional nonobstructing stones in both kidneys. 3. 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 4.  Aortic Atherosclerosis (ICD10-I70.0). Electronically Signed   By: Delbert Phenix M.D.   On: 09/05/2016 16:49    Labs:  CBC:  Recent Labs  09/05/16 1600 09/06/16 0606  WBC 13.5* 15.8*  HGB 11.5* 11.6*  HCT 34.7* 35.2*  PLT 524* 528*    COAGS: No results for input(s): INR, APTT in the last 8760 hours.  BMP:  Recent Labs  09/05/16 1600 09/06/16 0606  NA 135 139  K 4.2 4.9  CL 102 106  CO2 23 23  GLUCOSE 127* 153*  BUN 12 16  CALCIUM 8.8* 8.5*  CREATININE 1.43* 1.31*  GFRNONAA 53* 59*  GFRAA >60 >60    LIVER FUNCTION TESTS:  Recent Labs  09/05/16 1600  BILITOT 0.4  AST 33  ALT 11*  ALKPHOS 74  PROT 8.3*  ALBUMIN 2.7*    TUMOR MARKERS: No results for input(s): AFPTM, CEA, CA199, CHROMGRNA in the last 8760 hours.  Assessment and Plan: Obstructing renal stone, left hydronephrosis Patient with left hydronephrosis s/p attempt at stent placement overnight.  IR consulted for left percutaneous nephrostomy tube placement at the request of Dr. Marlou Porch.  Patient has been NPO.  He is not on blood thinners.  Risks and benefits discussed with the patient including, but not limited to infection, bleeding, significant bleeding causing loss  or decrease in renal function or damage to adjacent structures.  All of the patient's questions were answered, patient is agreeable to proceed. Consent signed and in chart.  Thank you for this interesting consult.  I greatly enjoyed meeting Anthony Huber and look forward to participating in their care.  A copy of this report was sent to the requesting provider on this date.  Electronically Signed: Hoyt Koch, PA 09/06/2016, 9:16 AM   I spent a total of 40 Minutes    in face to face in clinical consultation, greater than 50% of which was counseling/coordinating care for hydronephrosis.

## 2016-09-06 NOTE — Progress Notes (Signed)
Report given to Annabelle Harmanana, Charity fundraiserN.  Earnest ConroyBrooke M. Clelia CroftShaw, RN

## 2016-09-06 NOTE — Procedures (Signed)
Pyonephrosis with obstructing left UVJ stone  S/p left pcn  No comp Stable Purulent urine aspirated Urine cx sent Keep to gravity bag No flushes Full report in pacs

## 2016-09-06 NOTE — Progress Notes (Signed)
Assumed care of patient at . Agree with previous Nurse assessment.  Tove Wideman M. Jessyca Sloan, RN  

## 2016-09-07 LAB — URINE CULTURE: Culture: NO GROWTH

## 2016-09-07 LAB — CREATININE, SERUM
Creatinine, Ser: 1.74 mg/dL — ABNORMAL HIGH (ref 0.61–1.24)
GFR calc Af Amer: 48 mL/min — ABNORMAL LOW (ref >60)
GFR calc non Af Amer: 42 mL/min — ABNORMAL LOW (ref >60)

## 2016-09-07 NOTE — Progress Notes (Signed)
2 Days Post-Op Subjective: S/p L neph tube placement for left ureteral calculus with sepsis. Pt reports mild pain at neph tube site. Urine light pink from neph tube. Urine clear from bladder.   Objective: Vital signs in last 24 hours: Temp:  [97.7 F (36.5 C)-97.9 F (36.6 C)] 97.8 F (36.6 C) (09/03 0458) Pulse Rate:  [76-104] 81 (09/03 0458) Resp:  [10-19] 17 (09/03 0458) BP: (111-144)/(70-95) 119/72 (09/03 0458) SpO2:  [96 %-100 %] 97 % (09/03 0458)  Intake/Output from previous day: 09/02 0701 - 09/03 0700 In: 1246.7 [P.O.:220; I.V.:976.7; IV Piggyback:50] Out: 3000 [Urine:3000] Intake/Output this shift: No intake/output data recorded.  Physical Exam:  General:alert, cooperative and appears stated age GI: soft, non tender, normal bowel sounds, no palpable masses, no organomegaly, no inguinal hernia Male genitalia: not done Extremities: extremities normal, atraumatic, no cyanosis or edema  Lab Results:  Recent Labs  09/05/16 1600 09/06/16 0606  HGB 11.5* 11.6*  HCT 34.7* 35.2*   BMET  Recent Labs  09/05/16 1600 09/06/16 0606 09/07/16 0448  NA 135 139  --   K 4.2 4.9  --   CL 102 106  --   CO2 23 23  --   GLUCOSE 127* 153*  --   BUN 12 16  --   CREATININE 1.43* 1.31* 1.74*  CALCIUM 8.8* 8.5*  --    No results for input(s): LABPT, INR in the last 72 hours. No results for input(s): LABURIN in the last 72 hours. Results for orders placed or performed during the hospital encounter of 09/05/16  Urine culture     Status: None   Collection Time: 09/05/16  3:06 PM  Result Value Ref Range Status   Specimen Description URINE, CATHETERIZED  Final   Special Requests NONE  Final   Culture NO GROWTH  Final   Report Status 09/07/2016 FINAL  Final  Culture, blood (Routine X 2) w Reflex to ID Panel     Status: None (Preliminary result)   Collection Time: 09/05/16  4:00 PM  Result Value Ref Range Status   Specimen Description BLOOD LEFT ANTECUBITAL  Final   Special  Requests   Final    BOTTLES DRAWN AEROBIC AND ANAEROBIC Blood Culture adequate volume   Culture NO GROWTH < 24 HOURS  Final   Report Status PENDING  Incomplete  Culture, blood (Routine X 2) w Reflex to ID Panel     Status: None (Preliminary result)   Collection Time: 09/05/16  4:00 PM  Result Value Ref Range Status   Specimen Description BLOOD RIGHT ANTECUBITAL  Final   Special Requests IN PEDIATRIC BOTTLE Blood Culture adequate volume  Final   Culture NO GROWTH < 24 HOURS  Final   Report Status PENDING  Incomplete  Urine Culture     Status: None (Preliminary result)   Collection Time: 09/05/16 10:44 PM  Result Value Ref Range Status   Specimen Description CYSTOSCOPY  Final   Special Requests NONE  Final   Culture   Final    CULTURE REINCUBATED FOR BETTER GROWTH Performed at [facility], [address], [place], [place] [address]    Report Status PENDING  Incomplete  Surgical PCR screen     Status: None   Collection Time: 09/06/16 12:43 AM  Result Value Ref Range Status   MRSA, PCR NEGATIVE NEGATIVE Final   Staphylococcus aureus NEGATIVE NEGATIVE Final    Comment: (NOTE) The Xpert SA Assay (FDA approved for NASAL specimens in patients 22 years  of age and older), is one component of a comprehensive surveillance program. It is not intended to diagnose infection nor to guide or monitor treatment.   Urine Culture     Status: None (Preliminary result)   Collection Time: 09/06/16 11:28 AM  Result Value Ref Range Status   Specimen Description   Final    URINE, RANDOM IN SYRINGE Performed at [facility], [address] [place], [place] [address]    Special Requests NONE  Final   Culture PENDING  Incomplete   Report Status PENDING  Incomplete    Studies/Results: Dg C-arm 1-60 Min-no Report  Result Date: 09/05/2016 Fluoroscopy was utilized by the requesting physician.  No radiographic interpretation.   Ct Renal Stone Study  Result Date: 09/05/2016 CLINICAL DATA:   Flank pain.  Stone disease suspected. EXAM: CT ABDOMEN AND PELVIS WITHOUT CONTRAST TECHNIQUE: Multidetector CT imaging of the abdomen and pelvis was performed following the standard protocol without IV contrast. COMPARISON:  None. FINDINGS: Motion degraded scan. Lower chest: Mild scattered varicoid bronchiectasis at both lung bases. Hepatobiliary: Normal liver with no liver mass. Normal gallbladder with no radiopaque cholelithiasis. No biliary ductal dilatation. Pancreas: Normal, with no mass or duct dilation. Spleen: Normal size. No mass. Adrenals/Urinary Tract: Normal adrenals. Obstructing 20 x 9 mm distal left ureteral stone just above the left ureterovesical junction with severe left hydroureteronephrosis. Nonobstructing 8 mm and 2 mm lower left renal stones. Severe diffuse left renal parenchymal atrophy. No right hydronephrosis. Nonobstructing 2 mm lower right renal stone. Normal caliber right ureter, with no right ureteral stones. No contour deforming renal masses. Normal bladder. Stomach/Bowel: Grossly normal stomach. Normal caliber small bowel with no small bowel wall thickening. Normal appendix. Normal large bowel with no diverticulosis, large bowel wall thickening or pericolonic fat stranding. Vascular/Lymphatic: Atherosclerotic nonaneurysmal abdominal aorta. No pathologically enlarged lymph nodes in the abdomen or pelvis. Reproductive: Normal size prostate with coarse nonspecific internal prostatic calcifications. Other: No pneumoperitoneum, ascites or focal fluid collection. Musculoskeletal: No aggressive appearing focal osseous lesions. Mild thoracolumbar spondylosis. Incompletely healed chronic appearing deformities of the pubic symphysis bilaterally. IMPRESSION: 1. Obstructing 20 x 9 mm distal left ureteral stone just above the left UVJ, with severe left hydroureteronephrosis. This is probably a chronic obstruction given severe diffuse asymmetric left renal parenchymal atrophy . 2. Additional  nonobstructing stones in both kidneys. 3. 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 4.  Aortic Atherosclerosis (ICD10-I70.0). Electronically Signed   By: Delbert PhenixJason A Poff M.D.   On: 09/05/2016 16:49   Ir Nephrostomy Placement Left  Result Date: 09/06/2016 INDICATION: Obstructing left distal ureteral calculus, hydronephrosis EXAM: ULTRASOUND AND FLUOROSCOPIC 10 FRENCH LEFT NEPHROSTOMY COMPARISON:  09/05/2016 MEDICATIONS: Patient is already receiving antibiotics as an inpatient ANESTHESIA/SEDATION: Fentanyl 50 mcg IV; Versed 3.0 mg IV Moderate Sedation Time:  10 minutes The patient was continuously monitored during the procedure by the interventional radiology nurse under my direct supervision. CONTRAST:  10 cc - administered into the collecting system(s) FLUOROSCOPY TIME:  Fluoroscopy Time:  36 seconds (2 mGy). COMPLICATIONS: None immediate. PROCEDURE: Informed written consent was obtained from the patient and his family after a thorough discussion of the procedural risks, benefits and alternatives. All questions were addressed. Maximal Sterile Barrier Technique was utilized including caps, mask, sterile gowns, sterile gloves, sterile drape, hand hygiene and skin antiseptic. A timeout was performed prior to the initiation of the procedure. Previous imaging reviewed. Patient position prone. Hydronephrotic obstructed left kidney was localized. Overlying skin marked. Under sterile  conditions and local anesthesia, an 18 gauge access needle was advanced under direct ultrasound into a posterior dilated calyx. Needle position confirmed with ultrasound. Images obtained for documentation. There was return of purulent urine. Sample sent for culture. Amplatz guidewire inserted followed by tract dilatation to advance a 10 French nephrostomy. Nephrostomy catheter retention loop formed in the renal pelvis. Contrast injection confirms position. Syringe aspiration yielded 60 cc purulent urine to decompress the  system. Catheter secured with a Prolene suture and connected to external gravity drainage bag. Sterile dressing applied. No immediate complication. Patient tolerated the procedure well. IMPRESSION: Successful ultrasound and fluoroscopic 10 French left nephrostomy insertion. Urine culture sent. Electronically Signed   By: Judie Petit.  Shick M.D.   On: 09/06/2016 12:35    Assessment/Plan: 56yo with left ureteral calculus, severe hydronephrosis with sepsis  1. Continue broad spectrum antibiotics pending urine culture. 2. Continue foley and neph tube to straight drain   LOS: 1 day   Wilkie Aye 09/07/2016, 9:47 AM

## 2016-09-07 NOTE — Progress Notes (Signed)
Nutrition Brief Note  Patient identified on the Malnutrition Screening Tool (MST) Report  Wt Readings from Last 15 Encounters:  09/06/16 100 lb 5 oz (45.5 kg)  01/21/16 106 lb 3 oz (48.2 kg)    Body mass index is 20.26 kg/m. Patient meets criteria for normal weight based on current BMI. Pt has lost 6 lbs (5.7% body weight) in the past 8 months which is not significant for time frame.    Pt admitted for nephrolithiasis and is s/p L nephrostomy tube placement on 9/1. Pt speaks a dialect of [place] for which no Nurse, learning disabilitytranslator is available through [employer].    Current diet order is Regular with no intakes documented other than 50% of lunch on 9/2 which was food brought in from outside of the hospital. Medications reviewed.  Labs reviewed; creatinine: 1.74 mg/dL, GFR: 42 mg/dL. IVF: NS @ 125 mL/hr.   No nutrition interventions warranted at this time. If nutrition issues arise, please consult RD.     Trenton GammonJessica Shakyia Bosso, MS, RD, LDN, [facility] Inpatient Clinical Dietitian Pager # [phone] After hours/weekend pager # [phone]

## 2016-09-07 NOTE — Progress Notes (Signed)
Called to unit to discuss with RN who reports no issues with drain site.  Patient with some soreness but overall doing better. Drainage appears purulent.  WBC elevated today. Continues on broad spectrum abx per Urology.   Loyce DysKacie Matthews, MS RD PA-C 12:32 PM

## 2016-09-07 NOTE — Anesthesia Postprocedure Evaluation (Signed)
Anesthesia Post Note  Patient: Anthony Huber  Procedure(s) Performed: Procedure(s) (LRB): CYSTOSCOPY, URETEROSCOPY (Left)     Patient location during evaluation: PACU Anesthesia Type: General Level of consciousness: awake and alert Pain management: pain level controlled Vital Signs Assessment: post-procedure vital signs reviewed and stable Respiratory status: spontaneous breathing, nonlabored ventilation, respiratory function stable and patient connected to nasal cannula oxygen Cardiovascular status: blood pressure returned to baseline and stable Postop Assessment: no signs of nausea or vomiting Anesthetic complications: no    Last Vitals:  Vitals:   09/06/16 2002 09/07/16 0458  BP: 128/70 119/72  Pulse: 76 81  Resp: 18 17  Temp: 36.6 C 36.6 C  SpO2: 99% 97%    Last Pain:  Vitals:   09/07/16 0458  TempSrc: Oral  PainSc:                  Kendarius Vigen S

## 2016-09-08 ENCOUNTER — Encounter (HOSPITAL_COMMUNITY): Payer: Self-pay | Admitting: Urology

## 2016-09-08 LAB — CREATININE, SERUM
CREATININE: 1.32 mg/dL — AB (ref 0.61–1.24)
GFR, EST NON AFRICAN AMERICAN: 58 mL/min — AB (ref >60)

## 2016-09-08 LAB — URINE CULTURE

## 2016-09-08 MED ORDER — SODIUM CHLORIDE 0.9% FLUSH
5.0000 mL | Freq: Three times a day (TID) | INTRAVENOUS | Status: DC
Start: 2016-09-08 — End: 2016-09-09
  Administered 2016-09-08 – 2016-09-09 (×3): 5 mL via INTRAVENOUS

## 2016-09-08 MED ORDER — TRAMADOL HCL 50 MG PO TABS: 50.0000 mg | ORAL_TABLET | Freq: Four times a day (QID) | ORAL | Status: DC | PRN

## 2016-09-08 MED ORDER — SULFAMETHOXAZOLE-TRIMETHOPRIM 800-160 MG PO TABS
1.0000 | ORAL_TABLET | Freq: Two times a day (BID) | ORAL | Status: DC
  Administered 2016-09-08 – 2016-09-09 (×3): 1 via ORAL
  Filled 2016-09-08 (×3): qty 1

## 2016-09-08 NOTE — Progress Notes (Signed)
Referring Physician(s): Herrick,B  Supervising Physician: Simonne ComeWatts, John  Patient Status:  [facility] - In[facility]pt  Chief Complaint:  Left ureteral stone, hydronephrosis  Subjective: Pt doing ok today; no sig c/o at present; family in room   Allergies: Patient has no active allergies.  Medications: Prior to Admission medications   Medication Sig Start Date End Date Taking? Authorizing Provider  amoxicillin (AMOXIL) 500 MG capsule Take 1,000 mg by mouth 2 (two) times daily. 08/31/16  Yes [provider]  clarithromycin (BIAXIN) 500 MG tablet Take 500 mg by mouth 2 (two) times daily. 08/31/16  Yes [provider]  naproxen (NAPROSYN) 500 MG tablet Take 500 mg by mouth 2 (two) times daily as needed for pain. 08/26/16  Yes [provider]  omeprazole (PRILOSEC) 20 MG capsule Take 20 mg by mouth 2 (two) times daily. 08/31/16  Yes [provider]  pantoprazole (PROTONIX) 40 MG tablet Take 40 mg by mouth daily. 08/26/16  Yes [provider]  ciprofloxacin (CIPRO) 500 MG tablet Take 1 tablet (500 mg total) by mouth 2 (two) times daily. 09/05/16   Crist FatHerrick, Benjamin W, MD  diclofenac (VOLTAREN) 50 MG EC tablet Take 1 tablet (50 mg total) by mouth 2 (two) times daily. Patient not taking: Reported on 09/05/2016 01/22/16   Janne NapoleonNeese, Hope M, NP  traMADol (ULTRAM) 50 MG tablet Take 1-2 tablets (50-100 mg total) by mouth every 6 (six) hours as needed for moderate pain. 09/05/16   Crist FatHerrick, Benjamin W, MD     Vital Signs: BP (!) 153/91 (BP Location: Right Arm)   Pulse 79   Temp 98.1 F (36.7 C) (Oral)   Resp 18   Ht 4\' 11"  (1.499 m)   Wt 100 lb 5 oz (45.5 kg)   SpO2 99%   BMI 20.26 kg/m   Physical Exam left PCN intact, insertion site ok,NT, output 75 cc hazy, light yellow urine with some debris  Imaging: Dg C-arm 1-60 Min-no Report  Result Date: 09/05/2016 Fluoroscopy was utilized by the requesting physician.  No radiographic interpretation.   Ct Renal Stone  Study  Result Date: 09/05/2016 CLINICAL DATA:  Flank pain.  Stone disease suspected. EXAM: CT ABDOMEN AND PELVIS WITHOUT CONTRAST TECHNIQUE: Multidetector CT imaging of the abdomen and pelvis was performed following the standard protocol without IV contrast. COMPARISON:  None. FINDINGS: Motion degraded scan. Lower chest: Mild scattered varicoid bronchiectasis at both lung bases. Hepatobiliary: Normal liver with no liver mass. Normal gallbladder with no radiopaque cholelithiasis. No biliary ductal dilatation. Pancreas: Normal, with no mass or duct dilation. Spleen: Normal size. No mass. Adrenals/Urinary Tract: Normal adrenals. Obstructing 20 x 9 mm distal left ureteral stone just above the left ureterovesical junction with severe left hydroureteronephrosis. Nonobstructing 8 mm and 2 mm lower left renal stones. Severe diffuse left renal parenchymal atrophy. No right hydronephrosis. Nonobstructing 2 mm lower right renal stone. Normal caliber right ureter, with no right ureteral stones. No contour deforming renal masses. Normal bladder. Stomach/Bowel: Grossly normal stomach. Normal caliber small bowel with no small bowel wall thickening. Normal appendix. Normal large bowel with no diverticulosis, large bowel wall thickening or pericolonic fat stranding. Vascular/Lymphatic: Atherosclerotic nonaneurysmal abdominal aorta. No pathologically enlarged lymph nodes in the abdomen or pelvis. Reproductive: Normal size prostate with coarse nonspecific internal prostatic calcifications. Other: No pneumoperitoneum, ascites or focal fluid collection. Musculoskeletal: No aggressive appearing focal osseous lesions. Mild thoracolumbar spondylosis. Incompletely healed chronic appearing deformities of the pubic symphysis bilaterally. IMPRESSION: 1. Obstructing 20 x 9 mm  distal left ureteral stone just above the left UVJ, with severe left hydroureteronephrosis. This is probably a chronic obstruction given severe diffuse asymmetric left  renal parenchymal atrophy . 2. Additional nonobstructing stones in both kidneys. 3. 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 4.  Aortic Atherosclerosis (ICD10-I70.0). Electronically Signed   By: Delbert Phenix M.D.   On: 09/05/2016 16:49   Ir Nephrostomy Placement Left  Result Date: 09/06/2016 INDICATION: Obstructing left distal ureteral calculus, hydronephrosis EXAM: ULTRASOUND AND FLUOROSCOPIC 10 FRENCH LEFT NEPHROSTOMY COMPARISON:  09/05/2016 MEDICATIONS: Patient is already receiving antibiotics as an inpatient ANESTHESIA/SEDATION: Fentanyl 50 mcg IV; Versed 3.0 mg IV Moderate Sedation Time:  10 minutes The patient was continuously monitored during the procedure by the interventional radiology nurse under my direct supervision. CONTRAST:  10 cc - administered into the collecting system(s) FLUOROSCOPY TIME:  Fluoroscopy Time:  36 seconds (2 mGy). COMPLICATIONS: None immediate. PROCEDURE: Informed written consent was obtained from the patient and his family after a thorough discussion of the procedural risks, benefits and alternatives. All questions were addressed. Maximal Sterile Barrier Technique was utilized including caps, mask, sterile gowns, sterile gloves, sterile drape, hand hygiene and skin antiseptic. A timeout was performed prior to the initiation of the procedure. Previous imaging reviewed. Patient position prone. Hydronephrotic obstructed left kidney was localized. Overlying skin marked. Under sterile conditions and local anesthesia, an 18 gauge access needle was advanced under direct ultrasound into a posterior dilated calyx. Needle position confirmed with ultrasound. Images obtained for documentation. There was return of purulent urine. Sample sent for culture. Amplatz guidewire inserted followed by tract dilatation to advance a 10 French nephrostomy. Nephrostomy catheter retention loop formed in the renal pelvis. Contrast injection confirms position. Syringe aspiration yielded 60  cc purulent urine to decompress the system. Catheter secured with a Prolene suture and connected to external gravity drainage bag. Sterile dressing applied. No immediate complication. Patient tolerated the procedure well. IMPRESSION: Successful ultrasound and fluoroscopic 10 French left nephrostomy insertion. Urine culture sent. Electronically Signed   By: Judie Petit.  Shick M.D.   On: 09/06/2016 12:35    Labs:  CBC:  Recent Labs  09/05/16 1600 09/06/16 0606  WBC 13.5* 15.8*  HGB 11.5* 11.6*  HCT 34.7* 35.2*  PLT 524* 528*    COAGS: No results for input(s): INR, APTT in the last 8760 hours.  BMP:  Recent Labs  09/05/16 1600 09/06/16 0606 09/07/16 0448 09/08/16 0433  NA 135 139  --   --   K 4.2 4.9  --   --   CL 102 106  --   --   CO2 23 23  --   --   GLUCOSE 127* 153*  --   --   BUN 12 16  --   --   CALCIUM 8.8* 8.5*  --   --   CREATININE 1.43* 1.31* 1.74* 1.32*  GFRNONAA 53* 59* 42* 58*  GFRAA >60 >60 48* >60    LIVER FUNCTION TESTS:  Recent Labs  09/05/16 1600  BILITOT 0.4  AST 33  ALT 11*  ALKPHOS 74  PROT 8.3*  ALBUMIN 2.7*    Assessment and Plan: Pt with hx obstructing left distal ureteral calculus, hydronephrosis, E coli UTI; status post left nephrostomy 9/2; afebrile, creatinine 1.32(1.74); continue nephrostomy as  per urology note   Electronically Signed: D. Jeananne Rama, PA-C 09/08/2016, 3:22 PM   I spent a total of 15 minutes at the the patient's bedside AND on the patient's hospital floor or  unit, greater than 50% of which was counseling/coordinating care for left nephrostomy    Patient ID: Anthony Huber, male   DOB: October 13, 1960, 57 y.o.   MRN: [id number]

## 2016-09-08 NOTE — Progress Notes (Signed)
3 Days Post-Op Subjective: S/p L neph tube placement for left ureteral calculus with sepsis.  No issues overnight. Thumbs up today.  Unable to contact "Bol", whom I suspect is his son, this AM. No complaints.  Objective: Vital signs in last 24 hours: Temp:  [98 F (36.7 C)-98.3 F (36.8 C)] 98 F (36.7 C) (09/04 0527) Pulse Rate:  [71-82] 71 (09/04 0527) Resp:  [18] 18 (09/04 0527) BP: (125-143)/(74-87) 125/75 (09/04 0527) SpO2:  [98 %-99 %] 98 % (09/04 0527)  Intake/Output from previous day: 09/03 0701 - 09/04 0700 In: 2925 [P.O.:360; I.V.:2315; IV Piggyback:250] Out: 4200 [Urine:4200] Intake/Output this shift: No intake/output data recorded.  Physical Exam:  General:alert, cooperative and appears stated age GI: soft, non tender, normal bowel sounds, no palpable masses, no organomegaly, no inguinal hernia Male genitalia: not done Extremities: extremities normal, atraumatic, no cyanosis or edema Neph tube is straw colored Foley is clear  Lab Results:  Recent Labs  09/05/16 1600 09/06/16 0606  HGB 11.5* 11.6*  HCT 34.7* 35.2*   BMET  Recent Labs  09/05/16 1600 09/06/16 0606 09/07/16 0448 09/08/16 0433  NA 135 139  --   --   K 4.2 4.9  --   --   CL 102 106  --   --   CO2 23 23  --   --   GLUCOSE 127* 153*  --   --   BUN 12 16  --   --   CREATININE 1.43* 1.31* 1.74* 1.32*  CALCIUM 8.8* 8.5*  --   --    No results for input(s): LABPT, INR in the last 72 hours. No results for input(s): LABURIN in the last 72 hours. Results for orders placed or performed during the hospital encounter of 09/05/16  Urine culture     Status: None   Collection Time: 09/05/16  3:06 PM  Result Value Ref Range Status   Specimen Description URINE, CATHETERIZED  Final   Special Requests NONE  Final   Culture NO GROWTH  Final   Report Status 09/07/2016 FINAL  Final  Culture, blood (Routine X 2) w Reflex to ID Panel     Status: None (Preliminary result)   Collection Time: 09/05/16   4:00 PM  Result Value Ref Range Status   Specimen Description BLOOD LEFT ANTECUBITAL  Final   Special Requests   Final    BOTTLES DRAWN AEROBIC AND ANAEROBIC Blood Culture adequate volume   Culture NO GROWTH 2 DAYS  Final   Report Status PENDING  Incomplete  Culture, blood (Routine X 2) w Reflex to ID Panel     Status: None (Preliminary result)   Collection Time: 09/05/16  4:00 PM  Result Value Ref Range Status   Specimen Description BLOOD RIGHT ANTECUBITAL  Final   Special Requests IN PEDIATRIC BOTTLE Blood Culture adequate volume  Final   Culture NO GROWTH 2 DAYS  Final   Report Status PENDING  Incomplete  Urine Culture     Status: None (Preliminary result)   Collection Time: 09/05/16 10:44 PM  Result Value Ref Range Status   Specimen Description CYSTOSCOPY  Final   Special Requests NONE  Final   Culture   Final    CULTURE REINCUBATED FOR BETTER GROWTH Performed at [facility], [address], [place], [place] [address]    Report Status PENDING  Incomplete  Surgical PCR screen     Status: None   Collection Time: 09/06/16 12:43 AM  Result Value Ref Range  Status   MRSA, PCR NEGATIVE NEGATIVE Final   Staphylococcus aureus NEGATIVE NEGATIVE Final    Comment: (NOTE) The Xpert SA Assay (FDA approved for NASAL specimens in patients 57 years of age and older), is one component of a comprehensive surveillance program. It is not intended to diagnose infection nor to guide or monitor treatment.   Urine Culture     Status: Abnormal (Preliminary result)   Collection Time: 09/06/16 11:28 AM  Result Value Ref Range Status   Specimen Description URINE, RANDOM IN SYRINGE  Final   Special Requests NONE  Final   Culture (A)  Final    >=100,000 COLONIES/mL ESCHERICHIA COLI SUSCEPTIBILITIES TO FOLLOW Performed at [facility], [address] [place], [place] [id number]    Report Status PENDING  Incomplete    Studies/Results: Ir Nephrostomy Placement Left  Result Date:  09/06/2016 INDICATION: Obstructing left distal ureteral calculus, hydronephrosis EXAM: ULTRASOUND AND FLUOROSCOPIC 10 FRENCH LEFT NEPHROSTOMY COMPARISON:  09/05/2016 MEDICATIONS: Patient is already receiving antibiotics as an inpatient ANESTHESIA/SEDATION: Fentanyl 50 mcg IV; Versed 3.0 mg IV Moderate Sedation Time:  10 minutes The patient was continuously monitored during the procedure by the interventional radiology nurse under my direct supervision. CONTRAST:  10 cc - administered into the collecting system(s) FLUOROSCOPY TIME:  Fluoroscopy Time:  36 seconds (2 mGy). COMPLICATIONS: None immediate. PROCEDURE: Informed written consent was obtained from the patient and his family after a thorough discussion of the procedural risks, benefits and alternatives. All questions were addressed. Maximal Sterile Barrier Technique was utilized including caps, mask, sterile gowns, sterile gloves, sterile drape, hand hygiene and skin antiseptic. A timeout was performed prior to the initiation of the procedure. Previous imaging reviewed. Patient position prone. Hydronephrotic obstructed left kidney was localized. Overlying skin marked. Under sterile conditions and local anesthesia, an 18 gauge access needle was advanced under direct ultrasound into a posterior dilated calyx. Needle position confirmed with ultrasound. Images obtained for documentation. There was return of purulent urine. Sample sent for culture. Amplatz guidewire inserted followed by tract dilatation to advance a 10 French nephrostomy. Nephrostomy catheter retention loop formed in the renal pelvis. Contrast injection confirms position. Syringe aspiration yielded 60 cc purulent urine to decompress the system. Catheter secured with a Prolene suture and connected to external gravity drainage bag. Sterile dressing applied. No immediate complication. Patient tolerated the procedure well. IMPRESSION: Successful ultrasound and fluoroscopic 10 French left nephrostomy  insertion. Urine culture sent. Electronically Signed   By: Judie PetitM.  Shick M.D.   On: 09/06/2016 12:35    Assessment/Plan: 56yo with left ureteral calculus, severe hydronephrosis and Pyelonephritis - vitals have been stable.  Blood cultures were negative, urine cultures growing e.coli.  D/c Vanc Cont Zosyn until sensitivities return D/c foley HLIVF Oral pain medications. Suspect we can discharge him tomorrow on oral abx.  He will be discharged with neph tube and surgery scheduled in ~2 weeks.  He will not be able to work until after his surgery.    LOS: 2 days   Crist FatHERRICK, Jamichael Knotts W 09/08/2016, 7:05 AM

## 2016-09-09 ENCOUNTER — Other Ambulatory Visit: Payer: Self-pay | Admitting: Urology

## 2016-09-09 LAB — URINE CULTURE: Culture: 10000 — AB

## 2016-09-09 MED ORDER — SULFAMETHOXAZOLE-TRIMETHOPRIM 800-160 MG PO TABS: 1.0000 | ORAL_TABLET | Freq: Two times a day (BID) | ORAL | 0 refills | Status: DC

## 2016-09-09 NOTE — Discharge Summary (Signed)
Date of admission: 09/05/2016  Date of discharge: 09/09/2016  Admission diagnosis: infected left ureteral stone  Discharge diagnosis: same  Secondary diagnoses:  Patient Active Problem List   Diagnosis Date Noted  . Nephrolithiasis 09/05/2016    Procedures performed: Procedure(s): CYSTOSCOPY, URETEROSCOPY LEFT NEPHROSTOMY TUBE PLACEMENT  History and Physical: For full details, please see admission history and physical. Briefly, Anthony Huber is a 56 y.o. year old patient with an infected/impacted left distal ureteral stone and distal ureteral stricture.   Hospital Course: The patient was taken to the operating room the day of admission for attempted left sided stent (ureteral) placement. This was unsuccessful due to a very tight UPJ stricture and impacted stone. The following day the patient was subsequently taken to the interventional radiology and a left-sided nephrostomy tube was placed. Urine cultures from that demonstrated ESBL Escherichia coli. The patient was subsequently transitioned to Bactrim on postop day #2. 24 hours later he was noted to be afebrile with no significant pain. He was subsequently discharged. At the time of discharge the patient was tolerating a regular diet. He was up and moving around on his own. He was independent emptying the nephrostomy tube back. He was afebrile with stable vital signs. His pain was well controlled. The patient will be scheduled for follow-up PCNL.  Vitals:   09/08/16 0527 09/08/16 1515 09/08/16 2108 09/09/16 0553  BP: 125/75 (!) 153/91 (!) 145/85 (!) 146/79  Pulse: 71 79 78 77  Resp: 18 18 18 18   Temp: 98 F (36.7 C) 98.1 F (36.7 C) 98.6 F (37 C) 98 F (36.7 C)  TempSrc: Oral Oral Oral Oral  SpO2: 98% 99% 98% 98%  Weight:      Height:        Intake/Output Summary (Last 24 hours) at 09/09/16 0949 Last data filed at 09/09/16 0605  Gross per 24 hour  Intake              930 ml  Output             3000 ml  Net            -2070 ml    NAD Neph tube draining clear yellow urine Neph tube site dressed - c/d/i Non--labored breathing Abdomen soft Ext symmetric   Laboratory values:  No results for input(s): WBC, HGB, HCT in the last 72 hours.  Recent Labs  09/07/16 0448 09/08/16 0433  CREATININE 1.74* 1.32*   No results for input(s): LABPT, INR in the last 72 hours. No results for input(s): LABURIN in the last 72 hours. Results for orders placed or performed during the hospital encounter of 09/05/16  Urine culture     Status: None   Collection Time: 09/05/16  3:06 PM  Result Value Ref Range Status   Specimen Description URINE, CATHETERIZED  Final   Special Requests NONE  Final   Culture NO GROWTH  Final   Report Status 09/07/2016 FINAL  Final  Culture, blood (Routine X 2) w Reflex to ID Panel     Status: None (Preliminary result)   Collection Time: 09/05/16  4:00 PM  Result Value Ref Range Status   Specimen Description BLOOD LEFT ANTECUBITAL  Final   Special Requests   Final    BOTTLES DRAWN AEROBIC AND ANAEROBIC Blood Culture adequate volume   Culture NO GROWTH 4 DAYS  Final   Report Status PENDING  Incomplete  Culture, blood (Routine X 2) w Reflex to ID Panel     Status:  None (Preliminary result)   Collection Time: 09/05/16  4:00 PM  Result Value Ref Range Status   Specimen Description BLOOD RIGHT ANTECUBITAL  Final   Special Requests IN PEDIATRIC BOTTLE Blood Culture adequate volume  Final   Culture NO GROWTH 4 DAYS  Final   Report Status PENDING  Incomplete  Urine Culture     Status: Abnormal   Collection Time: 09/05/16 10:44 PM  Result Value Ref Range Status   Specimen Description CYSTOSCOPY  Final   Special Requests NONE  Final   Culture (A)  Final    10,000 COLONIES/mL ESCHERICHIA COLI Confirmed Extended Spectrum Beta-Lactamase Producer (ESBL) Performed at [facility], [address], [place], [place] [address]    Report Status 09/09/2016 FINAL  Final   Organism ID, Bacteria  ESCHERICHIA COLI (A)  Final      Susceptibility   Escherichia coli - MIC*    AMPICILLIN >=32 RESISTANT Resistant     CEFAZOLIN >=64 RESISTANT Resistant     CEFEPIME RESISTANT Resistant     CEFTAZIDIME RESISTANT Resistant     CEFTRIAXONE RESISTANT Resistant     CIPROFLOXACIN >=4 RESISTANT Resistant     GENTAMICIN >=16 RESISTANT Resistant     IMIPENEM <=0.25 SENSITIVE Sensitive     TRIMETH/SULFA <=20 SENSITIVE Sensitive     AMPICILLIN/SULBACTAM >=32 RESISTANT Resistant     PIP/TAZO 64 INTERMEDIATE Intermediate     Extended ESBL POSITIVE Resistant     * 10,000 COLONIES/mL ESCHERICHIA COLI  Surgical PCR screen     Status: None   Collection Time: 09/06/16 12:43 AM  Result Value Ref Range Status   MRSA, PCR NEGATIVE NEGATIVE Final   Staphylococcus aureus NEGATIVE NEGATIVE Final    Comment: (NOTE) The Xpert SA Assay (FDA approved for NASAL specimens in patients 88 years of age and older), is one component of a comprehensive surveillance program. It is not intended to diagnose infection nor to guide or monitor treatment.   Urine Culture     Status: Abnormal   Collection Time: 09/06/16 11:28 AM  Result Value Ref Range Status   Specimen Description URINE, RANDOM IN SYRINGE  Final   Special Requests NONE  Final   Culture (A)  Final    >=100,000 COLONIES/mL ESCHERICHIA COLI Confirmed Extended Spectrum Beta-Lactamase Producer (ESBL) Performed at [facility], [address] [place], [place] [address]    Report Status 09/08/2016 FINAL  Final   Organism ID, Bacteria ESCHERICHIA COLI (A)  Final      Susceptibility   Escherichia coli - MIC*    AMPICILLIN >=32 RESISTANT Resistant     CEFAZOLIN >=64 RESISTANT Resistant     CEFTRIAXONE >=64 RESISTANT Resistant     CIPROFLOXACIN >=4 RESISTANT Resistant     GENTAMICIN >=16 RESISTANT Resistant     IMIPENEM <=0.25 SENSITIVE Sensitive     NITROFURANTOIN <=16 SENSITIVE Sensitive     TRIMETH/SULFA <=20 SENSITIVE Sensitive      AMPICILLIN/SULBACTAM >=32 RESISTANT Resistant     PIP/TAZO 64 INTERMEDIATE Intermediate     Extended ESBL POSITIVE Resistant     * >=100,000 COLONIES/mL ESCHERICHIA COLI    Disposition: Home  Discharge instruction: The patient was instructed to be ambulatory but told to refrain from heavy lifting, strenuous activity, or driving.   Discharge medications:  Allergies as of 09/09/2016   No Active Allergies     Medication List    STOP taking these medications   amoxicillin 500 MG capsule Commonly known as:  AMOXIL  TAKE these medications   clarithromycin 500 MG tablet Commonly known as:  BIAXIN Take 500 mg by mouth 2 (two) times daily.   diclofenac 50 MG EC tablet Commonly known as:  VOLTAREN Take 1 tablet (50 mg total) by mouth 2 (two) times daily.   naproxen 500 MG tablet Commonly known as:  NAPROSYN Take 500 mg by mouth 2 (two) times daily as needed for pain.   omeprazole 20 MG capsule Commonly known as:  PRILOSEC Take 20 mg by mouth 2 (two) times daily.   pantoprazole 40 MG tablet Commonly known as:  PROTONIX Take 40 mg by mouth daily.   sulfamethoxazole-trimethoprim 800-160 MG tablet Commonly known as:  BACTRIM DS,SEPTRA DS Take 1 tablet by mouth every 12 (twelve) hours.   traMADol 50 MG tablet Commonly known as:  ULTRAM Take 1-2 tablets (50-100 mg total) by mouth every 6 (six) hours as needed for moderate pain.            Discharge Care Instructions        Start     Ordered   09/09/16 0000  sulfamethoxazole-trimethoprim (BACTRIM DS,SEPTRA DS) 800-160 MG tablet  Every 12 hours     09/09/16 0941   09/05/16 0000  traMADol (ULTRAM) 50 MG tablet  Every 6 hours PRN     09/05/16 2155      Followup:  Follow-up Information    Crist Fat, MD. Go on 09/16/2016.   Specialty:  Urology Why:  surgery Contact information: [address] [place] [address] [phone]

## 2016-09-09 NOTE — Discharge Instructions (Signed)
Percutaneous Nephrolithotomy Percutaneous nephrolithotomy is a procedure to remove kidney stones. Kidney stones are deposits that form inside your kidneys and can cause pain. You may need this procedure if:  You have large kidney stones. Kidney stones that are bigger than 2 cm (0.78 in) wide may require this procedure.  Your kidney stones are oddly shaped.  Other treatments have not been successful in helping the kidney stones to pass.  You have developed an infection due to the kidney stones.  Tell a health care provider about:  Any allergies you have.  All medicines you are taking, including vitamins, herbs, eye drops, creams, and over-the-counter medicines.  Any problems you or family members have had with anesthetic medicines.  Any blood disorders you have.  Any surgeries you have had.  Any medical conditions you have.  Whether you are pregnant or may be pregnant.  Whether you use any tobacco products, including cigarettes, chewing tobacco, or e-cigarettes. What are the risks? Generally, this is a safe procedure. However, problems may occur, including:  Infection.  Bleeding. This may include blood in your urine.  Allergic reactions to medicines.  Damage to other structures or organs.  Kidney damage.  Holes in the kidney. These often heal on their own.  Numbness or tingling in the affected area.  Sometimes, not all of the kidney stones are able to be removed with this procedure, so you may need a different procedure to remove them. What happens before the procedure?  Follow instructions from your health care provider about eating or drinking restrictions.  Ask your health care provider about: ? Changing or stopping your regular medicines. This is especially important if you are taking diabetes medicines or blood thinners. ? Taking medicines such as aspirin and ibuprofen. These medicines can thin your blood. Do not take these medicines before your procedure if  your health care provider instructs you not to.  Plan to have someone take you home after the procedure.  If you go home right after the procedure, plan to have someone with you for 24 hours.  You may have tests, including: ? Blood tests. ? Urine tests. ? Tests to check how your heart is working.  Ask your health care provider how your surgical site will be marked or identified.  You may be given antibiotic medicine to help prevent infection. What happens during the procedure?  To reduce your risk of infection: ? Your health care team will wash or sanitize their hands. ? Your skin will be washed with soap.  An IV tube will be inserted into one of your veins.  You will be given one or more of the following: ? A medicine to help you relax (sedative). ? A medicine to numb the area (local anesthetic). ? A medicine to make you fall asleep (general anesthetic). ? A medicine that is injected into your spine to numb the area below and slightly above the injection site (spinal anesthetic). ? A medicine that is injected into an area of your body to numb everything below the injection site (regional anesthetic).  A thin tube (catheter) will be put in your bladder to drain urine during and after the procedure.  Your surgeon will make a small cut (incision) in your lower back.  A tube will be inserted through the incision into your kidney.  Each kidney stone will be removed through this tube. Larger stones may need to be broken up with a high-intensity light beam (laser) or other tools.  If a kidney  stone left the kidney, your surgeon will bring it back to the kidney and then remove it through the tube.  After all of the stones have been removed, a kidney drain tube will be put in. This will help to drain any fluid that builds up while your kidney heals.  Part of the incision may be closed with stitches (sutures).  A bandage (dressing) will be placed over the incision area. The  procedure may vary among health care providers and hospitals. What happens after the procedure?  Your blood pressure, heart rate, breathing rate, and blood oxygen level will be monitored often until the medicines you were given have worn off.  You may be given medicine for pain.  You will be encouraged to walk. Walking helps to prevent blood clots.  You may be taught breathing exercises.  Do not drive for 24 hours if you received a sedative.  Discharge instructions following PCNL  Call your doctor for: Fevers greater than 100.5 Severe nausea or vomiting Increasing pain not controlled by pain medication Increasing redness or drainage from incisions Decreased urine output or a catheter is no longer draining  The number for questions is [phone].  Activity: Gradually increase activity with short frequent walks, 3-4 times a day.  Avoid strenuous activities, like sports, lawn-mowing, or heavy lifting (more than 10-15 pounds).  Wear loose, comfortable clothing that pull or kink the tube or tubes.  Do not drive while taking pain medication, or until your doctor permitts it.   Diet: It is extremely important to drink plenty of fluids after surgery, especially water.  You may resume your regular diet, unless otherwise instructed.  Medications: May take Tylenol (acetaminophen) or ibuprofen (Advil, Motrin) as directed over-the-counter. Take any prescriptions as directed.  Follow-up appointments: Surgery will be scheduled for next Wednesday 09/16/16

## 2016-09-09 NOTE — Care Management Note (Signed)
Case Management Note  Patient Details  Name: Anthony Huber MRN: [id number] Date of Birth: May 29, 1960  Subjective/Objective:56 y/o m admitted w/Nephrolithiasis. From home.                    Action/Plan:d/c home.   Expected Discharge Date:  09/09/16               Expected Discharge Plan:  Home/Self Care  In-House Referral:     Discharge planning Services  CM Consult  Post Acute Care Choice:    Choice offered to:     DME Arranged:    DME Agency:     HH Arranged:    HH Agency:     Status of Service:  Completed, signed off  If discussed at [employer] Length of Stay Meetings, dates discussed:    Additional Comments:  Lanier ClamMahabir, Gaudencio Chesnut, RN 09/09/2016, 10:32 AM

## 2016-09-09 NOTE — Evaluation (Signed)
To whom it may concern,  Anthony Huber was admitted to the hospital on Saturday, September 1 for a kidney infection and a large obstructing kidney stone. The patient subsequently underwent a procedure to relieve the obstruction from his kidney and allow the infection to drain. He currently has a tube in his kidney that is draining to a bag. He is being treated with antibiotics. He is scheduled for follow-up surgery on Wednesday, September 12. He will be unable to work for 2 weeks after his operation.he should be able to return to work on 09/28/2016.  Please contact my office if you have any additional questions or concerns.  Respectfully,       Crist FatBenjamin W. Tavian Callander, M.D. [facility] [place], [place] [phone]

## 2016-09-10 LAB — CULTURE, BLOOD (ROUTINE X 2)
CULTURE: NO GROWTH
Culture: NO GROWTH
SPECIAL REQUESTS: ADEQUATE
SPECIAL REQUESTS: ADEQUATE

## 2016-09-11 NOTE — Progress Notes (Signed)
09-06-16 (EPIC), CBC, BMP

## 2016-09-11 NOTE — Patient Instructions (Addendum)
Nixon Dileo  09/11/2016   Your procedure is scheduled on:  09-16-16  Report to [facility] Main  Entrance Take [place]  Elevators to 3rd floor to  Short Stay Center at 1:00 PM.    Call this number if you have problems the morning of surgery [phone]    Remember: ONLY 1 PERSON MAY GO WITH YOU TO SHORT STAY TO GET  READY MORNING OF YOUR SURGERY.  Do not eat food or drink liquids :After Midnight. You may have a Clear Liquid Diet from Midnight until 9:00 AM. After 9:00 AM, nothing until after surgery     CLEAR LIQUID DIET   Foods Allowed                                                                     Foods Excluded  Coffee and tea, regular and decaf                             liquids that you cannot  Plain Jell-O in any flavor                                             see through such as: Fruit ices (not with fruit pulp)                                     milk, soups, orange juice  Iced Popsicles                                    All solid food Carbonated beverages, regular and diet                                    Cranberry, grape and apple juices Sports drinks like Gatorade Lightly seasoned clear broth or consume(fat free) Sugar, honey syrup  Sample Menu Breakfast                                Lunch                                     Supper Cranberry juice                    Beef broth                            Chicken broth Jell-O                                     Grape  juice                           Apple juice Coffee or tea                        Jell-O                                      Popsicle                                                Coffee or tea                        Coffee or tea  _____________________________________________________________________     Take these medicines the morning of surgery with A SIP OF WATER: Omeprazole (Prilosec), and Pantoprazole (Protonix), Biaxin(Clarithromycin), Bactrim ( Sulfamethexazole-  Trimethoprin)                                You may not have any metal on your body including hair pins and              piercings  Do not wear jewelry,lotions, powders or perfumes, deodorant             Men may shave face and neck.   Do not bring valuables to the hospital. [facility] IS NOT             RESPONSIBLE   FOR VALUABLES.  Contacts, dentures or bridgework may not be worn into surgery.  Leave suitcase in the car. After surgery it may be brought to your room.                Please read over the following fact sheets you were given: _____________________________________________________________________             [facility] - Preparing for [facility] Before surgery, you can play an important role.  Because skin is not sterile, your skin needs to be as free of germs as possible.  You can reduce the number of germs on your skin by washing with CHG (chlorahexidine gluconate) soap before surgery.  CHG is an antiseptic cleaner which kills germs and bonds with the skin to continue killing germs even after washing. Please DO NOT use if you have an allergy to CHG or antibacterial soaps.  If your skin becomes reddened/irritated stop using the CHG and inform your nurse when you arrive at Short Stay. Do not shave (including legs and underarms) for at least 48 hours prior to the first CHG shower.  You may shave your face/neck. Please follow these instructions carefully:  1.  Shower with CHG Soap the night before surgery and the  morning of Surgery.  2.  If you choose to wash your hair, wash your hair first as usual with your  normal  shampoo.  3.  After you shampoo, rinse your hair and body thoroughly to remove the  shampoo.                           4.  Use CHG as  you would any other liquid soap.  You can apply chg directly  to the skin and wash                       Gently with a scrungie or clean washcloth.  5.  Apply the CHG Soap to your body ONLY FROM THE NECK DOWN.   Do not use on face/  open                           Wound or open sores. Avoid contact with eyes, ears mouth and genitals (private parts).                       Wash face,  Genitals (private parts) with your normal soap.             6.  Wash thoroughly, paying special attention to the area where your surgery  will be performed.  7.  Thoroughly rinse your body with warm water from the neck down.  8.  DO NOT shower/wash with your normal soap after using and rinsing off  the CHG Soap.                9.  Pat yourself dry with a clean towel.            10.  Wear clean pajamas.            11.  Place clean sheets on your bed the night of your first shower and do not  sleep with pets. Day of Surgery : Do not apply any lotions/deodorants the morning of surgery.  Please wear clean clothes to the hospital/surgery center.  FAILURE TO FOLLOW THESE INSTRUCTIONS MAY RESULT IN THE CANCELLATION OF YOUR SURGERY PATIENT SIGNATURE_________________________________  NURSE SIGNATURE__________________________________  ________________________________________________________________________

## 2016-09-15 ENCOUNTER — Encounter (HOSPITAL_COMMUNITY): Payer: Self-pay

## 2016-09-15 ENCOUNTER — Encounter (HOSPITAL_COMMUNITY)
Admission: RE | Admit: 2016-09-15 | Discharge: 2016-09-15 | Disposition: A | Discharge status: Home / Self Care | Payer: BLUE CROSS/BLUE SHIELD | Source: Ambulatory Visit | Attending: Urology | Admitting: Urology

## 2016-09-15 DIAGNOSIS — E119 Type 2 diabetes mellitus without complications: Secondary | ICD-10-CM | POA: Diagnosis not present

## 2016-09-15 DIAGNOSIS — I2510 Atherosclerotic heart disease of native coronary artery without angina pectoris: Secondary | ICD-10-CM | POA: Diagnosis not present

## 2016-09-15 DIAGNOSIS — Z87442 Personal history of urinary calculi: Secondary | ICD-10-CM | POA: Diagnosis not present

## 2016-09-15 DIAGNOSIS — Z87891 Personal history of nicotine dependence: Secondary | ICD-10-CM | POA: Diagnosis not present

## 2016-09-15 DIAGNOSIS — N132 Hydronephrosis with renal and ureteral calculous obstruction: Secondary | ICD-10-CM | POA: Diagnosis present

## 2016-09-15 DIAGNOSIS — I10 Essential (primary) hypertension: Secondary | ICD-10-CM | POA: Diagnosis not present

## 2016-09-15 DIAGNOSIS — Z955 Presence of coronary angioplasty implant and graft: Secondary | ICD-10-CM | POA: Diagnosis not present

## 2016-09-15 DIAGNOSIS — N131 Hydronephrosis with ureteral stricture, not elsewhere classified: Secondary | ICD-10-CM | POA: Diagnosis not present

## 2016-09-15 LAB — GLUCOSE, CAPILLARY: GLUCOSE-CAPILLARY: 108 mg/dL — AB (ref 65–99)

## 2016-09-15 LAB — CBC
HCT: 39.5 % (ref 39.0–52.0)
Hemoglobin: 13.2 g/dL (ref 13.0–17.0)
MCH: 21.7 pg — ABNORMAL LOW (ref 26.0–34.0)
MCHC: 33.4 g/dL (ref 30.0–36.0)
MCV: 64.9 fL — ABNORMAL LOW (ref 78.0–100.0)
PLATELETS: 448 10*3/uL — AB (ref 150–400)
RBC: 6.09 MIL/uL — ABNORMAL HIGH (ref 4.22–5.81)
RDW: 16.8 % — AB (ref 11.5–15.5)
WBC: 9.1 10*3/uL (ref 4.0–10.5)

## 2016-09-15 LAB — HEMOGLOBIN A1C
Hgb A1c MFr Bld: 6.3 % — ABNORMAL HIGH (ref 4.8–5.6)
MEAN PLASMA GLUCOSE: 134.11 mg/dL

## 2016-09-16 ENCOUNTER — Encounter (HOSPITAL_COMMUNITY)
Admission: RE | Disposition: A | Discharge status: Home / Self Care | Payer: Self-pay | Source: Ambulatory Visit | Attending: Urology

## 2016-09-16 ENCOUNTER — Ambulatory Visit (HOSPITAL_COMMUNITY): Payer: BLUE CROSS/BLUE SHIELD | Admitting: Certified Registered Nurse Anesthetist

## 2016-09-16 ENCOUNTER — Encounter (HOSPITAL_COMMUNITY): Payer: Self-pay | Admitting: Emergency Medicine

## 2016-09-16 ENCOUNTER — Observation Stay (HOSPITAL_COMMUNITY)
Admission: RE | Admit: 2016-09-16 | Discharge: 2016-09-17 | Disposition: A | Discharge status: Home / Self Care | Payer: BLUE CROSS/BLUE SHIELD | Source: Ambulatory Visit | Attending: Urology | Admitting: Urology

## 2016-09-16 ENCOUNTER — Ambulatory Visit (HOSPITAL_COMMUNITY): Payer: BLUE CROSS/BLUE SHIELD

## 2016-09-16 DIAGNOSIS — Z9889 Other specified postprocedural states: Secondary | ICD-10-CM

## 2016-09-16 DIAGNOSIS — E119 Type 2 diabetes mellitus without complications: Secondary | ICD-10-CM | POA: Insufficient documentation

## 2016-09-16 DIAGNOSIS — N132 Hydronephrosis with renal and ureteral calculous obstruction: Secondary | ICD-10-CM | POA: Diagnosis not present

## 2016-09-16 DIAGNOSIS — Z419 Encounter for procedure for purposes other than remedying health state, unspecified: Secondary | ICD-10-CM

## 2016-09-16 DIAGNOSIS — Z87442 Personal history of urinary calculi: Secondary | ICD-10-CM

## 2016-09-16 DIAGNOSIS — N131 Hydronephrosis with ureteral stricture, not elsewhere classified: Secondary | ICD-10-CM | POA: Insufficient documentation

## 2016-09-16 DIAGNOSIS — Z87891 Personal history of nicotine dependence: Secondary | ICD-10-CM | POA: Insufficient documentation

## 2016-09-16 DIAGNOSIS — I2510 Atherosclerotic heart disease of native coronary artery without angina pectoris: Secondary | ICD-10-CM | POA: Insufficient documentation

## 2016-09-16 DIAGNOSIS — I10 Essential (primary) hypertension: Secondary | ICD-10-CM | POA: Insufficient documentation

## 2016-09-16 DIAGNOSIS — Z955 Presence of coronary angioplasty implant and graft: Secondary | ICD-10-CM | POA: Insufficient documentation

## 2016-09-16 HISTORY — PX: NEPHROLITHOTOMY: SHX5134

## 2016-09-16 LAB — BASIC METABOLIC PANEL
Anion gap: 15 (ref 5–15)
BUN: 24 mg/dL — ABNORMAL HIGH (ref 6–20)
CO2: 17 mmol/L — ABNORMAL LOW (ref 22–32)
Calcium: 8.8 mg/dL — ABNORMAL LOW (ref 8.9–10.3)
Chloride: 104 mmol/L (ref 101–111)
Creatinine, Ser: 1.61 mg/dL — ABNORMAL HIGH (ref 0.61–1.24)
GFR calc Af Amer: 53 mL/min — ABNORMAL LOW (ref >60)
GFR calc non Af Amer: 46 mL/min — ABNORMAL LOW (ref >60)
Glucose, Bld: 222 mg/dL — ABNORMAL HIGH (ref 65–99)
Potassium: 3.9 mmol/L (ref 3.5–5.1)
Sodium: 136 mmol/L (ref 135–145)

## 2016-09-16 LAB — CBC
HCT: 36.9 % — ABNORMAL LOW (ref 39.0–52.0)
Hemoglobin: 12.2 g/dL — ABNORMAL LOW (ref 13.0–17.0)
MCH: 21.6 pg — AB (ref 26.0–34.0)
MCHC: 33.1 g/dL (ref 30.0–36.0)
MCV: 65.2 fL — AB (ref 78.0–100.0)
PLATELETS: 365 10*3/uL (ref 150–400)
RBC: 5.66 MIL/uL (ref 4.22–5.81)
RDW: 16.9 % — AB (ref 11.5–15.5)
WBC: 14.4 10*3/uL — ABNORMAL HIGH (ref 4.0–10.5)

## 2016-09-16 LAB — GLUCOSE, CAPILLARY
GLUCOSE-CAPILLARY: 169 mg/dL — AB (ref 65–99)
Glucose-Capillary: 89 mg/dL (ref 65–99)

## 2016-09-16 SURGERY — NEPHROLITHOTOMY PERCUTANEOUS
Anesthesia: General | Laterality: Left

## 2016-09-16 MED ORDER — LIDOCAINE 2% (20 MG/ML) 5 ML SYRINGE
INTRAMUSCULAR | Status: DC | PRN
  Administered 2016-09-16: 40 mg via INTRAVENOUS

## 2016-09-16 MED ORDER — SULFAMETHOXAZOLE-TRIMETHOPRIM 800-160 MG PO TABS
1.0000 | ORAL_TABLET | Freq: Two times a day (BID) | ORAL | Status: DC
  Administered 2016-09-16: 1 via ORAL
  Filled 2016-09-16: qty 1

## 2016-09-16 MED ORDER — ONDANSETRON HCL 4 MG/2ML IJ SOLN
INTRAMUSCULAR | Status: DC | PRN
  Administered 2016-09-16: 4 mg via INTRAVENOUS

## 2016-09-16 MED ORDER — LIDOCAINE-EPINEPHRINE (PF) 1 %-1:200000 IJ SOLN
INTRAMUSCULAR | Status: AC
  Filled 2016-09-16: qty 30

## 2016-09-16 MED ORDER — LACTATED RINGERS IV SOLN
INTRAVENOUS | Status: DC
  Administered 2016-09-16: 15:00:00 via INTRAVENOUS

## 2016-09-16 MED ORDER — DEXAMETHASONE SODIUM PHOSPHATE 10 MG/ML IJ SOLN
INTRAMUSCULAR | Status: DC | PRN
  Administered 2016-09-16: 10 mg via INTRAVENOUS

## 2016-09-16 MED ORDER — PROMETHAZINE HCL 25 MG/ML IJ SOLN: 6.2500 mg | INTRAMUSCULAR | Status: DC | PRN

## 2016-09-16 MED ORDER — MEPERIDINE HCL 50 MG/ML IJ SOLN: 6.2500 mg | INTRAMUSCULAR | Status: DC | PRN

## 2016-09-16 MED ORDER — ALBUTEROL SULFATE HFA 108 (90 BASE) MCG/ACT IN AERS
INHALATION_SPRAY | RESPIRATORY_TRACT | Status: AC
  Filled 2016-09-16: qty 6.7

## 2016-09-16 MED ORDER — IOPAMIDOL (ISOVUE-300) INJECTION 61%
INTRAVENOUS | Status: AC
  Filled 2016-09-16: qty 150

## 2016-09-16 MED ORDER — BUPIVACAINE HCL 0.5 % IJ SOLN
INTRAMUSCULAR | Status: DC | PRN
  Administered 2016-09-16: 20 mL

## 2016-09-16 MED ORDER — ROCURONIUM BROMIDE 50 MG/5ML IV SOSY
PREFILLED_SYRINGE | INTRAVENOUS | Status: AC
  Filled 2016-09-16: qty 10

## 2016-09-16 MED ORDER — MIDAZOLAM HCL 5 MG/5ML IJ SOLN
INTRAMUSCULAR | Status: DC | PRN
  Administered 2016-09-16: 0.5 mg via INTRAVENOUS

## 2016-09-16 MED ORDER — CEFAZOLIN SODIUM-DEXTROSE 2-4 GM/100ML-% IV SOLN
2.0000 g | INTRAVENOUS | Status: DC
  Filled 2016-09-16: qty 100

## 2016-09-16 MED ORDER — ACETAMINOPHEN 10 MG/ML IV SOLN
1000.0000 mg | Freq: Four times a day (QID) | INTRAVENOUS | Status: DC
  Administered 2016-09-16 – 2016-09-17 (×2): 1000 mg via INTRAVENOUS
  Filled 2016-09-16 (×3): qty 100

## 2016-09-16 MED ORDER — MORPHINE SULFATE (PF) 2 MG/ML IV SOLN: 2.0000 mg | INTRAVENOUS | Status: DC | PRN

## 2016-09-16 MED ORDER — HYDROMORPHONE HCL-NACL 0.5-0.9 MG/ML-% IV SOSY: 0.2500 mg | PREFILLED_SYRINGE | INTRAVENOUS | Status: DC | PRN

## 2016-09-16 MED ORDER — PROPOFOL 10 MG/ML IV BOLUS
INTRAVENOUS | Status: DC | PRN
  Administered 2016-09-16: 90 mg via INTRAVENOUS

## 2016-09-16 MED ORDER — SODIUM CHLORIDE 0.9 % IR SOLN
Status: DC | PRN
  Administered 2016-09-16: 5000 mL

## 2016-09-16 MED ORDER — ONDANSETRON HCL 4 MG/2ML IJ SOLN
INTRAMUSCULAR | Status: AC
  Filled 2016-09-16: qty 2

## 2016-09-16 MED ORDER — ROCURONIUM BROMIDE 50 MG/5ML IV SOSY
PREFILLED_SYRINGE | INTRAVENOUS | Status: DC | PRN
Start: 2016-09-16 — End: 2016-09-16
  Administered 2016-09-16: 5 mg via INTRAVENOUS
  Administered 2016-09-16: 10 mg via INTRAVENOUS
  Administered 2016-09-16: 40 mg via INTRAVENOUS
  Administered 2016-09-16: 5 mg via INTRAVENOUS

## 2016-09-16 MED ORDER — ZOLPIDEM TARTRATE 5 MG PO TABS: 5.0000 mg | ORAL_TABLET | Freq: Every evening | ORAL | Status: DC | PRN

## 2016-09-16 MED ORDER — IOPAMIDOL (ISOVUE-300) INJECTION 61%: INTRAVENOUS | Status: DC | PRN

## 2016-09-16 MED ORDER — OXYCODONE HCL 5 MG/5ML PO SOLN
5.0000 mg | Freq: Once | ORAL | Status: DC | PRN
  Filled 2016-09-16: qty 5

## 2016-09-16 MED ORDER — DEXAMETHASONE SODIUM PHOSPHATE 10 MG/ML IJ SOLN
INTRAMUSCULAR | Status: AC
  Filled 2016-09-16: qty 1

## 2016-09-16 MED ORDER — ROCURONIUM BROMIDE 50 MG/5ML IV SOSY
PREFILLED_SYRINGE | INTRAVENOUS | Status: AC
  Filled 2016-09-16: qty 5

## 2016-09-16 MED ORDER — PANTOPRAZOLE SODIUM 40 MG PO TBEC
40.0000 mg | DELAYED_RELEASE_TABLET | Freq: Every day | ORAL | Status: DC
  Administered 2016-09-16: 40 mg via ORAL
  Filled 2016-09-16: qty 1

## 2016-09-16 MED ORDER — PHENYLEPHRINE 40 MCG/ML (10ML) SYRINGE FOR IV PUSH (FOR BLOOD PRESSURE SUPPORT)
PREFILLED_SYRINGE | INTRAVENOUS | Status: AC
  Filled 2016-09-16: qty 10

## 2016-09-16 MED ORDER — SODIUM CHLORIDE 0.9 % IR SOLN
Status: DC | PRN
  Administered 2016-09-16: 9000 mL

## 2016-09-16 MED ORDER — ONDANSETRON HCL 4 MG/2ML IJ SOLN: 4.0000 mg | Freq: Four times a day (QID) | INTRAMUSCULAR | Status: DC | PRN

## 2016-09-16 MED ORDER — FENTANYL CITRATE (PF) 100 MCG/2ML IJ SOLN
INTRAMUSCULAR | Status: DC | PRN
  Administered 2016-09-16: 50 ug via INTRAVENOUS
  Administered 2016-09-16: 25 ug via INTRAVENOUS
  Administered 2016-09-16 (×3): 50 ug via INTRAVENOUS
  Administered 2016-09-16: 25 ug via INTRAVENOUS

## 2016-09-16 MED ORDER — LIDOCAINE-EPINEPHRINE (PF) 1 %-1:200000 IJ SOLN
INTRAMUSCULAR | Status: DC | PRN
  Administered 2016-09-16: 20 mL

## 2016-09-16 MED ORDER — LIDOCAINE 2% (20 MG/ML) 5 ML SYRINGE
INTRAMUSCULAR | Status: AC
  Filled 2016-09-16: qty 5

## 2016-09-16 MED ORDER — TRAMADOL HCL 50 MG PO TABS: 50.0000 mg | ORAL_TABLET | Freq: Four times a day (QID) | ORAL | Status: DC | PRN

## 2016-09-16 MED ORDER — SODIUM CHLORIDE 0.9 % IV SOLN
1.0000 g | Freq: Once | INTRAVENOUS | Status: AC
  Administered 2016-09-16: 1 g via INTRAVENOUS
  Filled 2016-09-16 (×2): qty 1

## 2016-09-16 MED ORDER — LACTATED RINGERS IV SOLN
INTRAVENOUS | Status: DC
  Administered 2016-09-16 (×2): via INTRAVENOUS

## 2016-09-16 MED ORDER — SUGAMMADEX SODIUM 200 MG/2ML IV SOLN
INTRAVENOUS | Status: DC | PRN
  Administered 2016-09-16: 150 mg via INTRAVENOUS

## 2016-09-16 MED ORDER — BUPIVACAINE HCL (PF) 0.5 % IJ SOLN
INTRAMUSCULAR | Status: AC
  Filled 2016-09-16: qty 30

## 2016-09-16 MED ORDER — OXYCODONE HCL 5 MG PO TABS: 5.0000 mg | ORAL_TABLET | Freq: Once | ORAL | Status: DC | PRN

## 2016-09-16 MED ORDER — ALBUTEROL SULFATE HFA 108 (90 BASE) MCG/ACT IN AERS
INHALATION_SPRAY | RESPIRATORY_TRACT | Status: DC | PRN
  Administered 2016-09-16: 2 via RESPIRATORY_TRACT
  Administered 2016-09-16: 4 via RESPIRATORY_TRACT

## 2016-09-16 MED ORDER — MIDAZOLAM HCL 2 MG/2ML IJ SOLN
INTRAMUSCULAR | Status: AC
  Filled 2016-09-16: qty 2

## 2016-09-16 MED ORDER — FENTANYL CITRATE (PF) 250 MCG/5ML IJ SOLN
INTRAMUSCULAR | Status: AC
  Filled 2016-09-16: qty 5

## 2016-09-16 MED ORDER — GENTAMICIN SULFATE 40 MG/ML IJ SOLN
5.0000 mg/kg | INTRAMUSCULAR | Status: DC
  Filled 2016-09-16: qty 5.75

## 2016-09-16 MED ORDER — IOHEXOL 300 MG/ML  SOLN
INTRAMUSCULAR | Status: DC | PRN
  Administered 2016-09-16: 10 mL

## 2016-09-16 MED ORDER — SODIUM CHLORIDE 0.9 % IR SOLN
Status: DC | PRN
  Administered 2016-09-16: 1000 mL

## 2016-09-16 SURGICAL SUPPLY — 55 items
BAG URINE DRAINAGE (UROLOGICAL SUPPLIES) ×3 IMPLANT
BASKET STNLS GEMINI 4WIRE 3FR (BASKET) IMPLANT
BASKET STONE NCOMPASS (UROLOGICAL SUPPLIES) IMPLANT
BASKET ZERO TIP NITINOL 2.4FR (BASKET) IMPLANT
BENZOIN TINCTURE PRP APPL 2/3 (GAUZE/BANDAGES/DRESSINGS) ×3 IMPLANT
BLADE SURG 15 STRL LF DISP TIS (BLADE) ×1 IMPLANT
BLADE SURG 15 STRL SS (BLADE) ×2
CATH AINSWORTH 30CC 24FR (CATHETERS) IMPLANT
CATH FOLEY 2WAY SLVR  5CC 16FR (CATHETERS)
CATH FOLEY 2WAY SLVR 5CC 16FR (CATHETERS) IMPLANT
CATH IMAGER II 65CM (CATHETERS) ×3 IMPLANT
CATH URET 5FR 28IN OPEN ENDED (CATHETERS) IMPLANT
CATH URET DUAL LUMEN 6-10FR 50 (CATHETERS) ×3 IMPLANT
CATH X-FORCE N30 NEPHROSTOMY (TUBING) ×3 IMPLANT
CHLORAPREP W/TINT 26ML (MISCELLANEOUS) ×3 IMPLANT
DECANTER SPIKE VIAL GLASS SM (MISCELLANEOUS) ×3 IMPLANT
DRAPE C-ARM 42X120 X-RAY (DRAPES) ×3 IMPLANT
DRAPE INCISE IOBAN 66X45 STRL (DRAPES) IMPLANT
DRAPE LINGEMAN PERC (DRAPES) ×3 IMPLANT
DRSG PAD ABDOMINAL 8X10 ST (GAUZE/BANDAGES/DRESSINGS) IMPLANT
DRSG TEGADERM 4X4.75 (GAUZE/BANDAGES/DRESSINGS) ×3 IMPLANT
DRSG TEGADERM 8X12 (GAUZE/BANDAGES/DRESSINGS) IMPLANT
EXTRACTOR STONE NITINOL NGAGE (UROLOGICAL SUPPLIES) ×3 IMPLANT
FIBER LASER FLEXIVA 1000 (UROLOGICAL SUPPLIES) IMPLANT
FIBER LASER FLEXIVA 365 (UROLOGICAL SUPPLIES) IMPLANT
FIBER LASER FLEXIVA 550 (UROLOGICAL SUPPLIES) IMPLANT
FIBER LASER TRAC TIP (UROLOGICAL SUPPLIES) ×3 IMPLANT
GAUZE SPONGE 4X4 12PLY STRL (GAUZE/BANDAGES/DRESSINGS) ×3 IMPLANT
GLOVE BIOGEL M STRL SZ7.5 (GLOVE) ×3 IMPLANT
GOWN STRL REUS W/TWL XL LVL3 (GOWN DISPOSABLE) ×3 IMPLANT
GUIDEWIRE ANG ZIPWIRE 038X150 (WIRE) ×3 IMPLANT
GUIDEWIRE STR DUAL SENSOR (WIRE) ×3 IMPLANT
IV SET EXTENSION CATH 6 NF (IV SETS) IMPLANT
KIT BASIN OR (CUSTOM PROCEDURE TRAY) ×3 IMPLANT
MANIFOLD NEPTUNE II (INSTRUMENTS) ×3 IMPLANT
NEEDLE SPNL 20GX3.5 QUINCKE YW (NEEDLE) IMPLANT
NEEDLE TROCAR 18X15 ECHO (NEEDLE) ×3 IMPLANT
NEEDLE TROCAR 18X20 (NEEDLE) IMPLANT
NS IRRIG 1000ML POUR BTL (IV SOLUTION) IMPLANT
PACK CYSTO (CUSTOM PROCEDURE TRAY) ×3 IMPLANT
PROBE LITHOCLAST ULTRA 3.8X403 (UROLOGICAL SUPPLIES) IMPLANT
PROBE PNEUMATIC 1.0MMX570MM (UROLOGICAL SUPPLIES) IMPLANT
SHEATH ACCESS URETERAL 38CM (SHEATH) ×3 IMPLANT
SHEATH PEELAWAY SET 9 (SHEATH) ×3 IMPLANT
SPONGE LAP 4X18 X RAY DECT (DISPOSABLE) ×3 IMPLANT
STENT CONTOUR 8FR X 28 (STENTS) ×3 IMPLANT
STONE CATCHER W/TUBE ADAPTER (UROLOGICAL SUPPLIES) IMPLANT
SUT ETHILON 2 0 PS N (SUTURE) ×3 IMPLANT
SYR 10ML LL (SYRINGE) ×3 IMPLANT
SYR 20CC LL (SYRINGE) ×6 IMPLANT
SYR 50ML LL SCALE MARK (SYRINGE) ×3 IMPLANT
SYRINGE IRR TOOMEY STRL 70CC (SYRINGE) IMPLANT
TOWEL OR 17X26 10 PK STRL BLUE (TOWEL DISPOSABLE) ×3 IMPLANT
TUBING CONNECTING 10 (TUBING) ×4 IMPLANT
TUBING CONNECTING 10' (TUBING) ×2

## 2016-09-16 NOTE — H&P (View-Only) (Signed)
I have been asked to see the patient by Dr. Antony Salmonaniela Ray, for evaluation and management of left distal ureteral stone.  History of present illness: 89M who presented to the ED with left sided flank pain.  He had no nausea.  He denies any fevers.  He has been having flank pain for the last two weeks.  Seen by his PCP and started on abx for fever and concern of a UTI.  He has not had any voiding symptoms.  He has a history of nephrolithiasis.  He has had two operation on his left kidney to remove stones.  He has also had a prostate surgery.  In the ED he was noted to have UA with large WBCs, his serum WBC was 13K and his CT scan showed a 2cm x 0.9cm stone in the left distal ureter.  His kidney was dilated  Review of systems: A 12 point comprehensive review of systems was obtained and is negative unless otherwise stated in the history of present illness.  Patient Active Problem List   Diagnosis Date Noted  . Nephrolithiasis 09/05/2016    No current facility-administered medications on file prior to encounter.    Current Outpatient Prescriptions on File Prior to Encounter  Medication Sig Dispense Refill  . diclofenac (VOLTAREN) 50 MG EC tablet Take 1 tablet (50 mg total) by mouth 2 (two) times daily. (Patient not taking: Reported on 09/05/2016) 15 tablet 0    Past Medical History:  Diagnosis Date  . Coronary artery disease   . Diabetes mellitus without complication (HCC)   . Hypertension     Past Surgical History:  Procedure Laterality Date  . BACK SURGERY    . BLADDER SURGERY    . CORONARY ANGIOPLASTY WITH STENT PLACEMENT      Social History  Substance Use Topics  . Smoking status: Former Games developermoker  . Smokeless tobacco: Never Used  . Alcohol use No    History reviewed. No pertinent family history.  PE: Vitals:   09/05/16 1800 09/05/16 1915 09/05/16 2030 09/05/16 2042  BP: 139/89 125/77 133/86 138/86  Pulse: 87 80 83 86  Resp: 14   16  Temp:      TempSrc:      SpO2: 100%  99% 99% 100%  Weight:      Height:       Patient appears to be in no acute distress  patient is alert and oriented x3 Atraumatic normocephalic head No cervical or supraclavicular lymphadenopathy appreciated No increased work of breathing, no audible wheezes/rhonchi Regular sinus rhythm/rate Abdomen is soft, nontender, nondistended, left CVA tenderness Lower extremities are symmetric without appreciable edema Grossly neurologically intact No identifiable skin lesions   Recent Labs  09/05/16 1600  WBC 13.5*  HGB 11.5*  HCT 34.7*    Recent Labs  09/05/16 1600  NA 135  K 4.2  CL 102  CO2 23  GLUCOSE 127*  BUN 12  CREATININE 1.43*  CALCIUM 8.8*   No results for input(s): LABPT, INR in the last 72 hours. No results for input(s): LABURIN in the last 72 hours. No results found for this or any previous visit.  Imaging: I have independently reviewed the CT scan- this shows a large distal left sided stone with hydronephrosis.  His kidney looks chronically dilated and atrophied.  Imp: Left sided distal obstructing stone with UA concerning for possible infection. Recommendations: Plan to take patient to the OR for stent placement tonight.  He will then follow-up for ureteroscopy in a  few weeks.  It would also be worth a renogram to ascertain how function the left kidney is.  However, regardless I think his left distal ureteral stone needs to be removed.   Plan to admit after the surgery for observation and discharge in the AM if he remains stable.

## 2016-09-16 NOTE — Anesthesia Procedure Notes (Signed)
Procedure Name: Intubation Date/Time: 09/16/2016 3:58 PM Performed by: West Pugh Pre-anesthesia Checklist: Patient identified, Emergency Drugs available, Suction available, Patient being monitored and Timeout performed Patient Re-evaluated:Patient Re-evaluated prior to induction Oxygen Delivery Method: Circle system utilized Preoxygenation: Pre-oxygenation with 100% oxygen Induction Type: IV induction and Cricoid Pressure applied Ventilation: Mask ventilation without difficulty Laryngoscope Size: Mac and 4 Grade View: Grade I Tube type: Oral Tube size: 7.5 mm Number of attempts: 1 Airway Equipment and Method: Stylet Placement Confirmation: ETT inserted through vocal cords under direct vision,  positive ETCO2,  CO2 detector and breath sounds checked- equal and bilateral Secured at: 21 cm Tube secured with: Tape Dental Injury: Teeth and Oropharynx as per pre-operative assessment

## 2016-09-16 NOTE — Transfer of Care (Signed)
Immediate Anesthesia Transfer of Care Note  Patient: Anthony Huber  Procedure(s) Performed: Procedure(s): NEPHROLITHOTOMY PERCUTANEOUS  LEFT (Left)  Patient Location: PACU  Anesthesia Type:General  Level of Consciousness: awake and alert   Airway & Oxygen Therapy: Patient Spontanous Breathing and Patient connected to face mask oxygen  Post-op Assessment: Report given to RN and Post -op Vital signs reviewed and stable  Post vital signs: Reviewed and stable  Last Vitals:  Vitals:   09/16/16 1304  BP: 133/80  Pulse: 88  Resp: 18  Temp: 36.9 C  SpO2: 100%    Last Pain:  Vitals:   09/16/16 1339  TempSrc:   PainSc: 0-No pain         Complications: No apparent anesthesia complications

## 2016-09-16 NOTE — Anesthesia Procedure Notes (Signed)
Date/Time: 09/16/2016 7:23 PM Performed by: Minerva EndsMIRARCHI, Antwanette Wesche M Pre-anesthesia Checklist: Patient being monitored, Suction available, Emergency Drugs available and Patient identified Oxygen Delivery Method: Simple face mask Placement Confirmation: positive ETCO2 and breath sounds checked- equal and bilateral Dental Injury: Teeth and Oropharynx as per pre-operative assessment  Comments: Extubated to simple face mask--- good AW to PACU with O2 intact

## 2016-09-16 NOTE — Anesthesia Preprocedure Evaluation (Signed)
Anesthesia Evaluation  Patient identified by MRN, date of birth, ID band Patient awake    Reviewed: Allergy & Precautions, H&P , NPO status , Patient's Chart, lab work & pertinent test results  Airway Mallampati: II   Neck ROM: full    Dental  (+) Dental Advisory Given, Chipped, Poor Dentition   Pulmonary former smoker,    breath sounds clear to auscultation       Cardiovascular hypertension, Pt. on medications + CAD   Rhythm:regular Rate:Normal  Pt denies HTN and denies heart problems   Neuro/Psych    GI/Hepatic   Endo/Other  diabetes, Type 2Pt denies h/o DM  Renal/GU Renal diseaseH/o stones   H/o prostate [place] in [place]    Musculoskeletal   Abdominal   Peds  Hematology   Anesthesia Other Findings   Reproductive/Obstetrics                             Anesthesia Physical  Anesthesia Plan  ASA: II  Anesthesia Plan: General   Post-op Pain Management:    Induction: Intravenous  PONV Risk Score and Plan: 2 and Ondansetron, Dexamethasone, Treatment may vary due to age or medical condition and Midazolam  Airway Management Planned: Oral ETT  Additional Equipment:   Intra-op Plan:   Post-operative Plan: Extubation in OR  Informed Consent: I have reviewed the patients History and Physical, chart, labs and discussed the procedure including the risks, benefits and alternatives for the proposed anesthesia with the patient or authorized representative who has indicated his/her understanding and acceptance.   Dental advisory given  Plan Discussed with: CRNA  Anesthesia Plan Comments:         Anesthesia Quick Evaluation

## 2016-09-16 NOTE — Op Note (Addendum)
Pre-operative diagnosis:  #1: left distal ureteral stricture #2: 2cm left distal ureteral stone  Post-operative diagnosis: as above   Procedure performed: left percutaneous renal access, left nephrolithotomy, left nephrostogram, Left Laser endoureterotomy, left ureteral stent placement    Surgeon: Dr. Ardis Hughs   Anesthesia: General  Complications: None  Specimens: The majority of the stones were removed and will be sent to the [facility] for further analysis.  Findings: 1. The patient had a 2 cm stone in the distal left ureter. The stone was dusted mostly, but there were some fragments that were easily removed and will be sent for a specimen. #2. The patient had a stricture at his left UPJ which I was able to passively dilate using the ureteroscope. There was also very dense stricture at the UVJ there was approximately 3 cm long which I incised using the laser. I incised the stricture down to the ureteral orifice within the bladder. This was performed in a antegrade fashion.   EBL: Approximately 100 cc  Specimens: stone from collecting system - taken to [facility]  Indication: Anthony Huber is a 56 y.o. patient with a history of nephrolithiasis with a large stone burden in the leftureter. I tried several weeks prior to pass a stent because of my concern for infection. I was unsuccessful as I could not get the wire beyond the distal stricture or stone. He did have a nephrostomy tube placed and has been placed on antibiotics now for 2 weeks.. After reviewing the management options for treatment, he elected to proceed with the above surgical procedure(s). We have discussed the potential benefits and risks of the procedure, side effects of the proposed treatment, the likelihood of the patient achieving the goals of the procedure, and any potential problems that might occur during the procedure or recuperation. Informed consent has been  obtained.   Description:  Consent was obtained in the preoperative holding area. The patient was marked appropriately and then taken back to the operating room where he was intubated on the gurney. The patient was flipped prone onto the split leg OR table. Large jelly rolls were placed in the anterior axillary line on both sides allowing the patient's chest and abdomen to fall inbetween. The patient was then prepped and draped in the routine sterile fashion in the left flank. A timeout was then held confirming the proper side and procedure as well as antibiotics were administered.  I injected the nephrostomy tube that was placed previously and opacified the collecting system. I noted that the nephrostomy tube was placed in the renal pelvis and as such I opted to obtain access in a different location. As such, using the C-arm rotated at 25 and the bulls-eye technique with an 18-gauge coaxial needle the mid pole lateral calyx was targeted. Then rotating the C-arm AP depth of our needle was noted to be within the calyx and the inner part of the coaxial needle was removed. Urine was noted to return. A 0.038 sensor wire was then passed through the sheath of the coaxial needle and into the left renal collecting system. The wire was then passed down the ureter and into the bladder using fluoroscopic guide and the sheath of the needle was removed.  An angiographic catheter was then advanced into the bladder and the wire removed.  A Super Stiff wire was then passed into the angiographic catheter and the angiographic catheter removed. A 9 French peel-away dilator was then advanced over the [employer]  wire and passed into the renal pelvis and across the UPJ under fluoroscopic guidance.  The inner part of this dilator was removed and the 0.38 sensor wire was passed alongside the Super Stiff wire through the dilator and into the left ureter and down into the bladder. The angiographic catheter again was passed over the  guidewire and advanced into the bladder, the wire was then removed. A Super Stiff wire was then passed through angiographic catheter and angiographic catheter removed. The outer part of the sheath was then removed, establishing 2 superstiff wires through the targeted calyx and into the bladder.   The 72 French NephroMax balloon was then passed over one of the Super Stiff wires and the tip guided down into the targeted calyx. The balloon was then inflated to approximately 12 atm, and once there was no waist noted under fluoroscopy the access sheath was advanced over the balloon. The balloon was then removed. The wires were then placed back into the sheaths and snapped to the drape.   Using the rigid nephroscope to explore the targeted calyx and kidney.  A small stone was noted within the renal pelvis.  Then using the flexible ureteroscope the ureter was navigated in antegrade fashion.  Advancing down to the UVJ the large stone was identified and using the 200  fiber with settings of 25 Hz and 0.5 joules the stone was dusted. I then advanced the scope beyond the stone and performed a laser endo ureterotomy incising the stricture anteriorly along the distal ureter and UVJ. This was performed all the way down into the bladder. Once the ureter was clear the scope was advanced into the bladder and a 0.038 sensor wire was left in the bladder and the scope backed out over wire. The sensor wire was then backloaded over the rigid nephroscope using the stent pusher and a 24 cm x 8 French double-J ureteral stent was passed antegrade over the sensor wire down into the bladder under fluoroscopic guidance. Once the stent was in the bladder the wire was gently pulled back and a nice curl noted in the bladder. The wire completely removed from the stent, and nice curl on the proximal end of the stent was noted in the renal pelvis. The sheath was then backed out slowly to ensure that all calyces had been inspected and there was  nothing behind the sheath.   I slowly backed out the sheath noting no additional stone fragments this time. I then removed the sheath completely. Noting no significant bleeding at this time,I removed both the working wire and the safety wire. Fluoroscopy confirmed the excellent location for the stent.  25 cc of local anesthesia was then injected into the patient's wound, and the wound was closed with 3-0 nylon in 2 vertical mattress sutures.the patient's previous nephrostomy tube was removed subsequently. The incision was then padded using a bundle of 4 x 4's and Hypafix tape. Patient was subsequently rolled over to the supine position and extubated. The patient was returned to the PACU in excellent condition. At the end of the case all lap and needle and sponges were accounted for. There are no perioperative complications.

## 2016-09-16 NOTE — Interval H&P Note (Signed)
History and Physical Interval Note:  09/16/2016 3:00 PM  Anthony Huber  has presented today for surgery, with the diagnosis of LEFT OBSTRUCTING  URETERAL STONE  The various methods of treatment have been discussed with the patient and family. After consideration of risks, benefits and other options for treatment, the patient has consented to  Procedure(s): NEPHROLITHOTOMY PERCUTANEOUS  LEFT (Left) as a surgical intervention .  The patient's history has been reviewed, patient examined, no change in status, stable for surgery.  I have reviewed the patient's chart and labs.  Questions were answered to the patient's satisfaction.     Berniece SalinesHERRICK, Anthony Huber

## 2016-09-17 ENCOUNTER — Observation Stay (HOSPITAL_COMMUNITY): Payer: BLUE CROSS/BLUE SHIELD

## 2016-09-17 ENCOUNTER — Encounter (HOSPITAL_COMMUNITY): Payer: Self-pay | Admitting: Urology

## 2016-09-17 DIAGNOSIS — N132 Hydronephrosis with renal and ureteral calculous obstruction: Secondary | ICD-10-CM | POA: Diagnosis not present

## 2016-09-17 LAB — CBC
HCT: 33.1 % — ABNORMAL LOW (ref 39.0–52.0)
HEMOGLOBIN: 11 g/dL — AB (ref 13.0–17.0)
MCH: 21.4 pg — AB (ref 26.0–34.0)
MCHC: 33.2 g/dL (ref 30.0–36.0)
MCV: 64.3 fL — ABNORMAL LOW (ref 78.0–100.0)
PLATELETS: 386 10*3/uL (ref 150–400)
RBC: 5.15 MIL/uL (ref 4.22–5.81)
RDW: 16.8 % — AB (ref 11.5–15.5)
WBC: 10.4 10*3/uL (ref 4.0–10.5)

## 2016-09-17 LAB — BASIC METABOLIC PANEL
Anion gap: 10 (ref 5–15)
BUN: 23 mg/dL — AB (ref 6–20)
CALCIUM: 8.7 mg/dL — AB (ref 8.9–10.3)
CO2: 20 mmol/L — AB (ref 22–32)
CREATININE: 1.51 mg/dL — AB (ref 0.61–1.24)
Chloride: 104 mmol/L (ref 101–111)
GFR calc Af Amer: 57 mL/min — ABNORMAL LOW (ref >60)
GFR calc non Af Amer: 50 mL/min — ABNORMAL LOW (ref >60)
GLUCOSE: 164 mg/dL — AB (ref 65–99)
Potassium: 5.6 mmol/L — ABNORMAL HIGH (ref 3.5–5.1)
Sodium: 134 mmol/L — ABNORMAL LOW (ref 135–145)

## 2016-09-17 MED ORDER — SULFAMETHOXAZOLE-TRIMETHOPRIM 800-160 MG PO TABS: 1.0000 | ORAL_TABLET | Freq: Two times a day (BID) | ORAL | 0 refills | Status: AC

## 2016-09-17 MED ORDER — TRAMADOL HCL 50 MG PO TABS: 50.0000 mg | ORAL_TABLET | Freq: Four times a day (QID) | ORAL | 0 refills | Status: DC | PRN

## 2016-09-17 NOTE — Care Management Note (Signed)
Case Management Note  Patient Details  Name: Pietro Cassishai Montella MRN: [id number] Date of Birth: 02-18-1960  Subjective/Objective: 56 y/o m admitted w/s/p nephrolithotomy w/removal of calculi. From home.                   Action/Plan:d/c plan home.   Expected Discharge Date:  09/17/16               Expected Discharge Plan:  Home/Self Care  In-House Referral:     Discharge planning Services  CM Consult  Post Acute Care Choice:    Choice offered to:     DME Arranged:    DME Agency:     HH Arranged:    HH Agency:     Status of Service:  Completed, signed off  If discussed at [employer] Length of Stay Meetings, dates discussed:    Additional Comments:  Lanier ClamMahabir, Alwaleed Obeso, RN 09/17/2016, 9:31 AM

## 2016-09-17 NOTE — Discharge Summary (Signed)
Date of admission: 09/16/2016  Date of discharge: 09/17/2016  Admission diagnosis: left distal ureteral stone, left distal ureteral stricture  Discharge diagnosis: same  Secondary diagnoses:  Patient Active Problem List   Diagnosis Date Noted  . H/O nephrolithotomy with removal of calculi 09/16/2016  . Nephrolithiasis 09/05/2016    Procedures performed: Procedure(s): NEPHROLITHOTOMY PERCUTANEOUS  LEFT Laser endoureterotomy  History and Physical: For full details, please see admission history and physical. Briefly, Anthony Huber is a 56 y.o. year old patient with large left distal ureteral stone and a large dense left distal ureteral stricture.   Hospital Course: Patient tolerated the procedure well.  He was then transferred to the floor after an uneventful PACU stay.  His hospital course was uncomplicated.  On POD#1 he had met discharge criteria: was eating a regular diet, was up and ambulating independently,  pain was well controlled, was voiding without a catheter, and was ready to for discharge.  PE: NAD Vitals:   09/16/16 2015 09/16/16 2030 09/16/16 2054 09/17/16 0608  BP: (!) 168/89 (!) 171/87 (!) 167/87 135/73  Pulse: (!) 109 (!) 112 (!) 110 96  Resp: _0 Temp:  98 F (36.7 C) 97.7 F (36.5 C) 98.5 F (36.9 C)  TempSrc:    Oral  SpO2: 100% 100% 100% 98%  Weight:   45.1 kg (99 lb 6.8 oz)   Height:   _1  (1.499 m)   abdomen soft nonlabored breathing Left CVA dressed, c/d/i, no hematoma, no leakage Extremities symmetric  Laboratory values:   Recent Labs  09/15/16 0903 09/16/16 2112 09/17/16 0531  WBC 9.1 14.4* 10.4  HGB 13.2 12.2* 11.0*  HCT 39.5 36.9* 33.1*    Recent Labs  09/16/16 2112 09/17/16 0531  NA 136 134*  K 3.9 5.6*  CL 104 104  CO2 17* 20*  GLUCOSE 222* 164*  BUN 24* 23*  CREATININE 1.61* 1.51*  CALCIUM 8.8* 8.7*   No results for input(s): LABPT, INR in the last 72 hours. No results for input(s): LABURIN in the last 72  hours. Results for orders placed or performed during the hospital encounter of 09/05/16  Urine culture     Status: None   Collection Time: 09/05/16  3:06 PM  Result Value Ref Range Status   Specimen Description URINE, CATHETERIZED  Final   Special Requests NONE  Final   Culture NO GROWTH  Final   Report Status 09/07/2016 FINAL  Final  Culture, blood (Routine X 2) w Reflex to ID Panel     Status: None   Collection Time: 09/05/16  4:00 PM  Result Value Ref Range Status   Specimen Description BLOOD LEFT ANTECUBITAL  Final   Special Requests   Final    BOTTLES DRAWN AEROBIC AND ANAEROBIC Blood Culture adequate volume   Culture NO GROWTH 5 DAYS  Final   Report Status 09/10/2016 FINAL  Final  Culture, blood (Routine X 2) w Reflex to ID Panel     Status: None   Collection Time: 09/05/16  4:00 PM  Result Value Ref Range Status   Specimen Description BLOOD RIGHT ANTECUBITAL  Final   Special Requests IN PEDIATRIC BOTTLE Blood Culture adequate volume  Final   Culture NO GROWTH 5 DAYS  Final   Report Status 09/10/2016 FINAL  Final  Urine Culture     Status: Abnormal   Collection Time: 09/05/16 10:44 PM  Result Value Ref Range Status   Specimen Description CYSTOSCOPY  Final   Special Requests  NONE  Final   Culture (A)  Final    10,000 COLONIES/mL ESCHERICHIA COLI Confirmed Extended Spectrum Beta-Lactamase Producer (ESBL) Performed at [facility], [place] [address], [place], [place] [address]    Report Status 09/09/2016 FINAL  Final   Organism ID, Bacteria ESCHERICHIA COLI (A)  Final      Susceptibility   Escherichia coli - MIC*    AMPICILLIN >=32 RESISTANT Resistant     CEFAZOLIN >=64 RESISTANT Resistant     CEFEPIME RESISTANT Resistant     CEFTAZIDIME RESISTANT Resistant     CEFTRIAXONE RESISTANT Resistant     CIPROFLOXACIN >=4 RESISTANT Resistant     GENTAMICIN >=16 RESISTANT Resistant     IMIPENEM <=0.25 SENSITIVE Sensitive     TRIMETH/SULFA <=20 SENSITIVE Sensitive      AMPICILLIN/SULBACTAM >=32 RESISTANT Resistant     PIP/TAZO 64 INTERMEDIATE Intermediate     Extended ESBL POSITIVE Resistant     * 10,000 COLONIES/mL ESCHERICHIA COLI  Surgical PCR screen     Status: None   Collection Time: 09/06/16 12:43 AM  Result Value Ref Range Status   MRSA, PCR NEGATIVE NEGATIVE Final   Staphylococcus aureus NEGATIVE NEGATIVE Final    Comment: (NOTE) The Xpert SA Assay (FDA approved for NASAL specimens in patients 24 years of age and older), is one component of a comprehensive surveillance program. It is not intended to diagnose infection nor to guide or monitor treatment.   Urine Culture     Status: Abnormal   Collection Time: 09/06/16 11:28 AM  Result Value Ref Range Status   Specimen Description URINE, RANDOM IN SYRINGE  Final   Special Requests NONE  Final   Culture (A)  Final    >=100,000 COLONIES/mL ESCHERICHIA COLI Confirmed Extended Spectrum Beta-Lactamase Producer (ESBL) Performed at [facility], [address], [place], [place] [address]    Report Status 09/08/2016 FINAL  Final   Organism ID, Bacteria ESCHERICHIA COLI (A)  Final      Susceptibility   Escherichia coli - MIC*    AMPICILLIN >=32 RESISTANT Resistant     CEFAZOLIN >=64 RESISTANT Resistant     CEFTRIAXONE >=64 RESISTANT Resistant     CIPROFLOXACIN >=4 RESISTANT Resistant     GENTAMICIN >=16 RESISTANT Resistant     IMIPENEM <=0.25 SENSITIVE Sensitive     NITROFURANTOIN <=16 SENSITIVE Sensitive     TRIMETH/SULFA <=20 SENSITIVE Sensitive     AMPICILLIN/SULBACTAM >=32 RESISTANT Resistant     PIP/TAZO 64 INTERMEDIATE Intermediate     Extended ESBL POSITIVE Resistant     * >=100,000 COLONIES/mL ESCHERICHIA COLI    Disposition: Home  Discharge instruction: The patient was instructed to be ambulatory but told to refrain from heavy lifting, strenuous activity, or driving.   Discharge medications:  Allergies as of 09/17/2016   No Known Allergies     Medication List    STOP  taking these medications   clarithromycin 500 MG tablet Commonly known as:  BIAXIN   diclofenac 50 MG EC tablet Commonly known as:  VOLTAREN   pantoprazole 40 MG tablet Commonly known as:  PROTONIX     TAKE these medications   naproxen 500 MG tablet Commonly known as:  NAPROSYN Take 500 mg by mouth 2 (two) times daily as needed for pain.   omeprazole 20 MG capsule Commonly known as:  PRILOSEC Take 20 mg by mouth 2 (two) times daily.   sulfamethoxazole-trimethoprim 800-160 MG tablet Commonly known as:  BACTRIM DS,SEPTRA DS Take  1 tablet by mouth every 12 (twelve) hours.   traMADol 50 MG tablet Commonly known as:  ULTRAM Take 1-2 tablets (50-100 mg total) by mouth every 6 (six) hours as needed for moderate pain.            Discharge Care Instructions        Start     Ordered   09/17/16 0000  sulfamethoxazole-trimethoprim (BACTRIM DS,SEPTRA DS) 800-160 MG tablet  Every 12 hours     09/17/16 0532   09/17/16 0000  traMADol (ULTRAM) 50 MG tablet  Every 6 hours PRN     09/17/16 0532      Followup:  Follow-up Information    Ardis Hughs, MD Follow up on 10/05/2016.   Specialty:  Urology Why:  10:30am, Stent removal Contact information: [place] [address] [phone]

## 2016-09-17 NOTE — Discharge Instructions (Signed)
Discharge instructions following PCNL  Call your doctor for: Fevers greater than 100.5 Severe nausea or vomiting Increasing pain not controlled by pain medication Increasing redness or drainage from incisions Decreased urine output or a catheter is no longer draining  The number for questions is [phone].  Activity: Gradually increase activity with short frequent walks, 3-4 times a day.  Avoid strenuous activities, like sports, lawn-mowing, or heavy lifting (more than 10-15 pounds).  Wear loose, comfortable clothing that pull or kink the tube or tubes.  Do not drive while taking pain medication, or until your doctor permitts it.  Bathing and dressing changes: You should not shower for 48 hours after surgery.  Do not soak your back in a bathtub.  Diet: It is extremely important to drink plenty of fluids after surgery, especially water.  You may resume your regular diet, unless otherwise instructed.  Medications: May take Tylenol (acetaminophen) or ibuprofen (Advil, Motrin) as directed over-the-counter. Take any prescriptions as directed.  Follow-up appointments: Follow-up appointment will be scheduled with Dr. Timberlynn Kizziah in 10-14 days for hospital check and stent removal.  

## 2016-09-18 NOTE — Anesthesia Postprocedure Evaluation (Signed)
Anesthesia Post Note  Patient: Anthony Huber  Procedure(s) Performed: Procedure(s) (LRB): NEPHROLITHOTOMY PERCUTANEOUS  LEFT (Left)     Patient location during evaluation: PACU Anesthesia Type: General Level of consciousness: sedated and patient cooperative Pain management: pain level controlled Vital Signs Assessment: post-procedure vital signs reviewed and stable Respiratory status: spontaneous breathing Cardiovascular status: stable Anesthetic complications: no    Last Vitals:  Vitals:   09/16/16 2054 09/17/16 0608  BP: (!) 167/87 135/73  Pulse: (!) 110 96  Resp: 20 16  Temp: 36.5 C 36.9 C  SpO2: 100% 98%    Last Pain:  Vitals:   09/17/16 0626  TempSrc:   PainSc: Asleep                 Lewie Loron

## 2020-04-06 ENCOUNTER — Inpatient Hospital Stay (HOSPITAL_COMMUNITY)
Admission: EM | Admit: 2020-04-06 | Discharge: 2020-04-11 | DRG: 064 | Disposition: A | Discharge status: Rehabilitation Facility | Payer: BC Managed Care – PPO | Attending: Internal Medicine | Admitting: Internal Medicine

## 2020-04-06 ENCOUNTER — Emergency Department (HOSPITAL_COMMUNITY): Payer: BC Managed Care – PPO

## 2020-04-06 DIAGNOSIS — E1165 Type 2 diabetes mellitus with hyperglycemia: Secondary | ICD-10-CM | POA: Diagnosis not present

## 2020-04-06 DIAGNOSIS — Z20822 Contact with and (suspected) exposure to covid-19: Secondary | ICD-10-CM | POA: Diagnosis present

## 2020-04-06 DIAGNOSIS — Z87891 Personal history of nicotine dependence: Secondary | ICD-10-CM | POA: Diagnosis not present

## 2020-04-06 DIAGNOSIS — Z955 Presence of coronary angioplasty implant and graft: Secondary | ICD-10-CM | POA: Diagnosis not present

## 2020-04-06 DIAGNOSIS — I610 Nontraumatic intracerebral hemorrhage in hemisphere, subcortical: Secondary | ICD-10-CM | POA: Diagnosis present

## 2020-04-06 DIAGNOSIS — D62 Acute posthemorrhagic anemia: Secondary | ICD-10-CM | POA: Diagnosis not present

## 2020-04-06 DIAGNOSIS — R4182 Altered mental status, unspecified: Secondary | ICD-10-CM | POA: Diagnosis present

## 2020-04-06 DIAGNOSIS — N322 Vesical fistula, not elsewhere classified: Secondary | ICD-10-CM | POA: Diagnosis present

## 2020-04-06 DIAGNOSIS — I674 Hypertensive encephalopathy: Secondary | ICD-10-CM | POA: Diagnosis not present

## 2020-04-06 DIAGNOSIS — G8194 Hemiplegia, unspecified affecting left nondominant side: Secondary | ICD-10-CM | POA: Diagnosis present

## 2020-04-06 DIAGNOSIS — N179 Acute kidney failure, unspecified: Secondary | ICD-10-CM | POA: Diagnosis not present

## 2020-04-06 DIAGNOSIS — N1339 Other hydronephrosis: Secondary | ICD-10-CM | POA: Diagnosis present

## 2020-04-06 DIAGNOSIS — Z87442 Personal history of urinary calculi: Secondary | ICD-10-CM

## 2020-04-06 DIAGNOSIS — I2510 Atherosclerotic heart disease of native coronary artery without angina pectoris: Secondary | ICD-10-CM | POA: Diagnosis present

## 2020-04-06 DIAGNOSIS — I10 Essential (primary) hypertension: Secondary | ICD-10-CM | POA: Diagnosis present

## 2020-04-06 DIAGNOSIS — D72829 Elevated white blood cell count, unspecified: Secondary | ICD-10-CM | POA: Diagnosis present

## 2020-04-06 DIAGNOSIS — I161 Hypertensive emergency: Secondary | ICD-10-CM | POA: Diagnosis present

## 2020-04-06 DIAGNOSIS — R29712 NIHSS score 12: Secondary | ICD-10-CM | POA: Diagnosis present

## 2020-04-06 DIAGNOSIS — I619 Nontraumatic intracerebral hemorrhage, unspecified: Secondary | ICD-10-CM

## 2020-04-06 DIAGNOSIS — E119 Type 2 diabetes mellitus without complications: Secondary | ICD-10-CM | POA: Diagnosis present

## 2020-04-06 DIAGNOSIS — R0682 Tachypnea, not elsewhere classified: Secondary | ICD-10-CM

## 2020-04-06 DIAGNOSIS — N9989 Other postprocedural complications and disorders of genitourinary system: Secondary | ICD-10-CM | POA: Diagnosis not present

## 2020-04-06 DIAGNOSIS — I6930 Unspecified sequelae of cerebral infarction: Secondary | ICD-10-CM | POA: Diagnosis present

## 2020-04-06 DIAGNOSIS — I6389 Other cerebral infarction: Secondary | ICD-10-CM | POA: Diagnosis not present

## 2020-04-06 DIAGNOSIS — R29810 Facial weakness: Secondary | ICD-10-CM | POA: Diagnosis present

## 2020-04-06 DIAGNOSIS — G936 Cerebral edema: Secondary | ICD-10-CM | POA: Diagnosis present

## 2020-04-06 DIAGNOSIS — M1A0610 Idiopathic chronic gout, right knee, without tophus (tophi): Secondary | ICD-10-CM | POA: Diagnosis not present

## 2020-04-06 DIAGNOSIS — K5901 Slow transit constipation: Secondary | ICD-10-CM | POA: Diagnosis not present

## 2020-04-06 DIAGNOSIS — M25521 Pain in right elbow: Secondary | ICD-10-CM

## 2020-04-06 DIAGNOSIS — R52 Pain, unspecified: Secondary | ICD-10-CM | POA: Diagnosis not present

## 2020-04-06 LAB — CBG MONITORING, ED: Glucose-Capillary: 102 mg/dL — ABNORMAL HIGH (ref 70–99)

## 2020-04-06 MED ORDER — SODIUM CHLORIDE 0.9% FLUSH
3.0000 mL | Freq: Once | INTRAVENOUS | Status: AC
Start: 2020-04-06 — End: 2020-04-07
  Administered 2020-04-07: 3 mL via INTRAVENOUS

## 2020-04-06 MED ORDER — CLEVIDIPINE BUTYRATE 0.5 MG/ML IV EMUL
INTRAVENOUS | Status: AC
  Filled 2020-04-06: qty 50

## 2020-04-06 MED ORDER — SENNOSIDES-DOCUSATE SODIUM 8.6-50 MG PO TABS
1.0000 | ORAL_TABLET | Freq: Two times a day (BID) | ORAL | Status: DC
  Administered 2020-04-08 – 2020-04-11 (×6): 1 via ORAL
  Filled 2020-04-06 (×7): qty 1

## 2020-04-06 MED ORDER — ACETAMINOPHEN 325 MG PO TABS
650.0000 mg | ORAL_TABLET | ORAL | Status: DC | PRN
  Administered 2020-04-09: 650 mg via ORAL
  Filled 2020-04-06 (×2): qty 2

## 2020-04-06 MED ORDER — PANTOPRAZOLE SODIUM 40 MG IV SOLR
40.0000 mg | Freq: Every day | INTRAVENOUS | Status: DC
  Administered 2020-04-07 (×2): 40 mg via INTRAVENOUS
  Filled 2020-04-06 (×2): qty 40

## 2020-04-06 MED ORDER — ACETAMINOPHEN 160 MG/5ML PO SOLN: 650.0000 mg | ORAL | Status: DC | PRN

## 2020-04-06 MED ORDER — CLEVIDIPINE BUTYRATE 0.5 MG/ML IV EMUL
0.0000 mg/h | INTRAVENOUS | Status: DC
  Administered 2020-04-06: 1 mg/h via INTRAVENOUS
  Administered 2020-04-07: 10 mg/h via INTRAVENOUS
  Administered 2020-04-07: 12 mg/h via INTRAVENOUS
  Administered 2020-04-07: 10 mg/h via INTRAVENOUS
  Administered 2020-04-07 (×2): 3 mg/h via INTRAVENOUS
  Administered 2020-04-07: 5 mg/h via INTRAVENOUS
  Administered 2020-04-08: 8 mg/h via INTRAVENOUS
  Administered 2020-04-08: 2 mg/h via INTRAVENOUS
  Administered 2020-04-08: 8 mg/h via INTRAVENOUS
  Administered 2020-04-08: 4 mg/h via INTRAVENOUS
  Filled 2020-04-06 (×3): qty 50
  Filled 2020-04-06: qty 100
  Filled 2020-04-06 (×2): qty 50
  Filled 2020-04-06: qty 100
  Filled 2020-04-06 (×2): qty 50

## 2020-04-06 MED ORDER — ACETAMINOPHEN 650 MG RE SUPP: 650.0000 mg | RECTAL | Status: DC | PRN

## 2020-04-06 MED ORDER — STROKE: EARLY STAGES OF RECOVERY BOOK: Freq: Once | Status: DC

## 2020-04-06 NOTE — ED Provider Notes (Signed)
[facility] EMERGENCY DEPARTMENT Provider Note   CSN: [id number] Arrival date & time: 04/06/20  2303  An emergency department physician performed an initial assessment on this suspected stroke patient at 2306.  History Chief Complaint  Patient presents with  . Code Stroke    Anthony Huber is a 60 y.o. male.  60 year old male who presents the emerge department via EMS for reported sudden change in mental status and weakness.  Apparently last known normal was at 2130.  Patient taken immediately to CT scanner after cursory neurologic exam.  No family here at this time to get a full history.  The history is limited by a language barrier and the absence of a caregiver. No language interpreter was used.       Past Medical History:  Diagnosis Date  . Coronary artery disease   . Diabetes mellitus without complication (HCC)   . Hypertension     Patient Active Problem List   Diagnosis Date Noted  . ICH (intracerebral hemorrhage) (HCC) 04/06/2020  . H/O nephrolithotomy with removal of calculi 09/16/2016  . Nephrolithiasis 09/05/2016    Past Surgical History:  Procedure Laterality Date  . BACK SURGERY    . BLADDER SURGERY    . CORONARY ANGIOPLASTY WITH STENT PLACEMENT    . CYSTOSCOPY WITH STENT PLACEMENT Left 09/05/2016   Procedure: CYSTOSCOPY, URETEROSCOPY;  Surgeon: Crist Fat, MD;  Location: [facility];  Service: Urology;  Laterality: Left;  . IR NEPHROSTOMY PLACEMENT LEFT  09/06/2016  . NEPHROLITHOTOMY Left 09/16/2016   Procedure: NEPHROLITHOTOMY PERCUTANEOUS  LEFT;  Surgeon: Crist Fat, MD;  Location: [facility];  Service: Urology;  Laterality: Left;       No family history on file.  Social History   Tobacco Use  . Smoking status: Former Smoker    Quit date: 2016    Years since quitting: 6.2  . Smokeless tobacco: Never Used  Vaping Use  . Vaping Use: Never used  Substance Use Topics  . Alcohol use: No  . Drug use: No    Home  Medications Prior to Admission medications   Medication Sig Start Date End Date Taking? Authorizing Provider  ibuprofen (ADVIL) 200 MG tablet Take 200 mg by mouth every 6 (six) hours as needed for headache or moderate pain.   Yes [provider]  Pseudoeph-Doxylamine-DM-APAP (NYQUIL PO) Take 1 Dose by mouth daily as needed (sore throat/congestion).   Yes [provider]  Pseudoephedrine-APAP-DM (DAYQUIL PO) Take 1 Dose by mouth daily as needed (sore throat/congestion).   Yes [provider]  naproxen (NAPROSYN) 500 MG tablet Take 500 mg by mouth 2 (two) times daily as needed for pain. Patient not taking: No sig reported 08/26/16   [provider]  omeprazole (PRILOSEC) 20 MG capsule Take 20 mg by mouth 2 (two) times daily. Patient not taking: No sig reported 08/31/16   [provider]  traMADol (ULTRAM) 50 MG tablet Take 1-2 tablets (50-100 mg total) by mouth every 6 (six) hours as needed for moderate pain. Patient not taking: No sig reported 09/17/16   Crist Fat, MD    Allergies    Patient has no known allergies.  Review of Systems   Review of Systems  Unable to perform ROS: Other    Physical Exam Updated Vital Signs BP 122/79   Pulse 96   Temp (!) 97.5 F (36.4 C) (Axillary)   Resp 14   Ht 4\' 11"  (1.499 m)   Wt 51.9 kg  SpO2 96%   BMI 23.13 kg/m   Physical Exam Vitals and nursing note reviewed.  HENT:     Nose: No congestion or rhinorrhea.     Mouth/Throat:     Mouth: Mucous membranes are moist.     Pharynx: Oropharynx is clear. No oropharyngeal exudate.  Eyes:     Pupils: Pupils are equal, round, and reactive to light.  Cardiovascular:     Rate and Rhythm: Tachycardia present.     Comments: hypertension Musculoskeletal:        General: No swelling or tenderness. Normal range of motion.  Skin:    General: Skin is warm and dry.  Neurological:     Mental Status: He is alert.     ED Results / Procedures /  Treatments   Labs (all labs ordered are listed, but only abnormal results are displayed) Labs Reviewed  CBC - Abnormal; Notable for the following components:      Result Value   WBC 15.7 (*)    Hemoglobin 12.6 (*)    MCV 71.0 (*)    MCH 22.4 (*)    RDW 15.6 (*)    All other components within normal limits  DIFFERENTIAL - Abnormal; Notable for the following components:   Neutro Abs 8.8 (*)    Monocytes Absolute 1.1 (*)    Eosinophils Absolute 2.0 (*)    All other components within normal limits  COMPREHENSIVE METABOLIC PANEL - Abnormal; Notable for the following components:   Glucose, Bld 105 (*)    Calcium 8.6 (*)    Albumin 3.4 (*)    All other components within normal limits  I-STAT CHEM 8, ED - Abnormal; Notable for the following components:   Glucose, Bld 125 (*)    Calcium, Ion 0.91 (*)    All other components within normal limits  CBG MONITORING, ED - Abnormal; Notable for the following components:   Glucose-Capillary 102 (*)    All other components within normal limits  RESP PANEL BY RT-PCR (FLU A&B, COVID) ARPGX2  PROTIME-INR  APTT  HIV ANTIBODY (ROUTINE TESTING W REFLEX)    EKG EKG Interpretation  Date/Time:  Saturday April 06 2020 23:27:36 EDT Ventricular Rate:  96 PR Interval:  167 QRS Duration: 90 QT Interval:  369 QTC Calculation: 467 R Axis:   61 Text Interpretation: Sinus rhythm Probable left atrial enlargement RSR' in V1 or V2, right VCD or RVH Confirmed by Marily Memos [phone]) on 04/06/2020 11:38:02 PM   Radiology CT HEAD CODE STROKE WO CONTRAST  Result Date: 04/06/2020 CLINICAL DATA:  Code stroke. Initial evaluation for neuro deficit, stroke suspected. EXAM: CT HEAD WITHOUT CONTRAST TECHNIQUE: Contiguous axial images were obtained from the base of the skull through the vertex without intravenous contrast. COMPARISON:  None. FINDINGS: Brain: Age-related cerebral atrophy with moderate chronic microvascular ischemic disease. 4.3 x 2.4 x 3.4 cm acute  intraparenchymal hemorrhage centered at the right lentiform nucleus (series 3, image 18, estimated volume 18 cc). Mild localized edema with regional mass effect. Mild effacement of the right lateral ventricle with up to 5 mm of right-to-left shift. No hydrocephalus or trapping. No visible intraventricular extension. No other acute intracranial hemorrhage or large vessel territory infarct. No mass lesion or extra-axial fluid collection. Vascular: No hyperdense vessel. Skull: Scalp soft tissues and calvarium within normal limits. Sinuses/Orbits: Globes and orbital soft tissues demonstrate no acute finding. Paranasal sinuses are largely clear. No mastoid effusion. Other: None. ASPECTS [facility] Stroke Program Early CT Score) Does not apply. IMPRESSION:  1. 4.3 x 2.4 x 3.4 cm acute intraparenchymal hemorrhage centered at the right basal ganglia, estimated volume 18 cc. Localized mass effect with up to 5 mm of right-to-left shift. 2. Underlying moderate chronic microvascular ischemic disease. These results were communicated to Dr. Amada Jupiter at 11:24 pmon 4/2/2022by text page via the [facility] messaging system. Electronically Signed   By: Rise Mu M.D.   On: 04/06/2020 23:27    Procedures .Critical Care Performed by: Marily Memos, MD Authorized by: Marily Memos, MD   Critical care provider statement:    Critical care time (minutes):  45   Critical care was necessary to treat or prevent imminent or life-threatening deterioration of the following conditions:  CNS failure or compromise   Critical care was time spent personally by me on the following activities:  Discussions with consultants, evaluation of patient's response to treatment, examination of patient, ordering and performing treatments and interventions, ordering and review of laboratory studies, ordering and review of radiographic studies, pulse oximetry, re-evaluation of patient's condition, obtaining history from patient or surrogate and  review of old charts     Medications Ordered in ED Medications  sodium chloride flush (NS) 0.9 % injection 3 mL (has no administration in time range)  clevidipine (CLEVIPREX) infusion 0.5 mg/mL (4 mg/hr Intravenous Rate/Dose Change 04/07/20 0322)   stroke: mapping our early stages of recovery book (has no administration in time range)  acetaminophen (TYLENOL) tablet 650 mg (has no administration in time range)    Or  acetaminophen (TYLENOL) 160 MG/5ML solution 650 mg (has no administration in time range)    Or  acetaminophen (TYLENOL) suppository 650 mg (has no administration in time range)  senna-docusate (Senokot-S) tablet 1 tablet (has no administration in time range)  pantoprazole (PROTONIX) injection 40 mg (has no administration in time range)    ED Course  I have reviewed the triage vital signs and the nursing notes.  Pertinent labs & imaging results that were available during my care of the patient were reviewed by me and considered in my medical decision making (see chart for details).    MDM Rules/Calculators/A&P                         60 yo M w/ hemorrhagic stroke. Hypertensive. Starting cleviprex.  Patient has some clear fluid leaking from a scar in the suprapubic area.  I was asked to evaluate this.  Appears to be urine.  According to the family it has been there for quite a while. Doubt emergent cause at this time.   Final Clinical Impression(s) / ED Diagnoses Final diagnoses:  Hemorrhagic stroke [facility])    Rx / DC Orders ED Discharge Orders    None       Kiowa Peifer, Barbara Cower, MD 04/07/20 [phone]

## 2020-04-06 NOTE — ED Triage Notes (Signed)
Pt BIB EMS for Code Stroke. EMS reports that around 9:25 pm pt had sudden onset of headache and vomiting. EMS reports total left side paralysis and no speech at home.  Initial BP with EMS 170/100 CBG 117 EMS placed 20 RH

## 2020-04-07 ENCOUNTER — Inpatient Hospital Stay (HOSPITAL_COMMUNITY): Payer: BC Managed Care – PPO

## 2020-04-07 DIAGNOSIS — I610 Nontraumatic intracerebral hemorrhage in hemisphere, subcortical: Secondary | ICD-10-CM | POA: Diagnosis not present

## 2020-04-07 LAB — COMPREHENSIVE METABOLIC PANEL
ALT: 9 U/L (ref 0–44)
AST: 23 U/L (ref 15–41)
Albumin: 3.4 g/dL — ABNORMAL LOW (ref 3.5–5.0)
Alkaline Phosphatase: 76 U/L (ref 38–126)
Anion gap: 9 (ref 5–15)
BUN: 15 mg/dL (ref 8–23)
CO2: 22 mmol/L (ref 22–32)
Calcium: 8.6 mg/dL — ABNORMAL LOW (ref 8.9–10.3)
Chloride: 107 mmol/L (ref 98–111)
Creatinine, Ser: 1.1 mg/dL (ref 0.61–1.24)
GFR, Estimated: >60 mL/min (ref >60)
Glucose, Bld: 105 mg/dL — ABNORMAL HIGH (ref 70–99)
Potassium: 4.1 mmol/L (ref 3.5–5.1)
Sodium: 138 mmol/L (ref 135–145)
Total Bilirubin: 0.5 mg/dL (ref 0.3–1.2)
Total Protein: 7.5 g/dL (ref 6.5–8.1)

## 2020-04-07 LAB — LIPID PANEL
Cholesterol: 173 mg/dL (ref 0–200)
HDL: 36 mg/dL — ABNORMAL LOW (ref >40)
LDL Cholesterol: 89 mg/dL (ref 0–99)
Total CHOL/HDL Ratio: 4.8 RATIO
Triglycerides: 238 mg/dL — ABNORMAL HIGH (ref <150)
VLDL: 48 mg/dL — ABNORMAL HIGH (ref 0–40)

## 2020-04-07 LAB — DIFFERENTIAL
Abs Immature Granulocytes: 0.05 10*3/uL (ref 0.00–0.07)
Basophils Absolute: 0 10*3/uL (ref 0.0–0.1)
Basophils Relative: 0 %
Eosinophils Absolute: 2 10*3/uL — ABNORMAL HIGH (ref 0.0–0.5)
Eosinophils Relative: 13 %
Immature Granulocytes: 0 %
Lymphocytes Relative: 24 %
Lymphs Abs: 3.7 10*3/uL (ref 0.7–4.0)
Monocytes Absolute: 1.1 10*3/uL — ABNORMAL HIGH (ref 0.1–1.0)
Monocytes Relative: 7 %
Neutro Abs: 8.8 10*3/uL — ABNORMAL HIGH (ref 1.7–7.7)
Neutrophils Relative %: 56 %

## 2020-04-07 LAB — PROTIME-INR
INR: 0.9 (ref 0.8–1.2)
Prothrombin Time: 12 seconds (ref 11.4–15.2)

## 2020-04-07 LAB — I-STAT CHEM 8, ED
BUN: 21 mg/dL (ref 8–23)
Calcium, Ion: 0.91 mmol/L — ABNORMAL LOW (ref 1.15–1.40)
Chloride: 109 mmol/L (ref 98–111)
Creatinine, Ser: 0.9 mg/dL (ref 0.61–1.24)
Glucose, Bld: 125 mg/dL — ABNORMAL HIGH (ref 70–99)
HCT: 41 % (ref 39.0–52.0)
Hemoglobin: 13.9 g/dL (ref 13.0–17.0)
Potassium: 3.9 mmol/L (ref 3.5–5.1)
Sodium: 139 mmol/L (ref 135–145)
TCO2: 23 mmol/L (ref 22–32)

## 2020-04-07 LAB — CBC
HCT: 40 % (ref 39.0–52.0)
Hemoglobin: 12.6 g/dL — ABNORMAL LOW (ref 13.0–17.0)
MCH: 22.4 pg — ABNORMAL LOW (ref 26.0–34.0)
MCHC: 31.5 g/dL (ref 30.0–36.0)
MCV: 71 fL — ABNORMAL LOW (ref 80.0–100.0)
Platelets: 301 10*3/uL (ref 150–400)
RBC: 5.63 MIL/uL (ref 4.22–5.81)
RDW: 15.6 % — ABNORMAL HIGH (ref 11.5–15.5)
WBC: 15.7 10*3/uL — ABNORMAL HIGH (ref 4.0–10.5)
nRBC: 0 % (ref 0.0–0.2)

## 2020-04-07 LAB — RESP PANEL BY RT-PCR (FLU A&B, COVID) ARPGX2
Influenza A by PCR: NEGATIVE
Influenza B by PCR: NEGATIVE
SARS Coronavirus 2 by RT PCR: NEGATIVE

## 2020-04-07 LAB — HIV ANTIBODY (ROUTINE TESTING W REFLEX): HIV Screen 4th Generation wRfx: NONREACTIVE

## 2020-04-07 LAB — APTT: aPTT: 26 seconds (ref 24–36)

## 2020-04-07 LAB — MRSA PCR SCREENING: MRSA by PCR: NEGATIVE

## 2020-04-07 LAB — HEMOGLOBIN A1C
Hgb A1c MFr Bld: 5.8 % — ABNORMAL HIGH (ref 4.8–5.6)
Mean Plasma Glucose: 119.76 mg/dL

## 2020-04-07 MED ORDER — CHLORHEXIDINE GLUCONATE CLOTH 2 % EX PADS
6.0000 | MEDICATED_PAD | Freq: Every day | CUTANEOUS | Status: DC
  Administered 2020-04-07 – 2020-04-11 (×5): 6 via TOPICAL

## 2020-04-07 MED ORDER — SODIUM CHLORIDE 0.9 % IV SOLN: INTRAVENOUS | Status: DC

## 2020-04-07 MED ORDER — ONDANSETRON HCL 4 MG/2ML IJ SOLN: 4.0000 mg | Freq: Four times a day (QID) | INTRAMUSCULAR | Status: DC | PRN

## 2020-04-07 NOTE — H&P (Signed)
Neurology H&P  CC:  Right sided weakness  History is obtained from:patient  HPI: Anthony Huber is a 60 y.o. male with a history of hypertension and diabetes who does not see a doctor on a regular basis who presents with left-sided weakness that started abruptly this evening.  Sometime around 930 he began having some nausea and headache.  Around 10, he was going to take an Advil when he noticed that his left arm was not working appropriately.  He was at that time that EMS was called and a code stroke was activated in route.  Unfortunately interpreter for his particular language was not available and so the son acted as interpreter after arrival.  LKW: 9:30 PM tpa given?: No, ICH IR Thrombectomy? No, ICH Modified Rankin Scale: 0-Completely asymptomatic and back to baseline post- stroke NIHSS: 12   ROS: A complete ROS was performed and is negative except as noted in the HPI.   Past Medical History:  Diagnosis Date  . Coronary artery disease   . Diabetes mellitus without complication (HCC)   . Hypertension      Family history: Limited due to language barrier  Social History:  reports that he quit smoking about 6 years ago. He has never used smokeless tobacco. He reports that he does not drink alcohol and does not use drugs.   Prior to Admission medications   Medication Sig Start Date End Date Taking? Authorizing Provider  ibuprofen (ADVIL) 200 MG tablet Take 200 mg by mouth every 6 (six) hours as needed for headache or moderate pain.   Yes [provider]  Pseudoeph-Doxylamine-DM-APAP (NYQUIL PO) Take 1 Dose by mouth daily as needed (sore throat/congestion).   Yes [provider]  Pseudoephedrine-APAP-DM (DAYQUIL PO) Take 1 Dose by mouth daily as needed (sore throat/congestion).   Yes [provider]  naproxen (NAPROSYN) 500 MG tablet Take 500 mg by mouth 2 (two) times daily as needed for pain. Patient not taking: No sig reported 08/26/16   [provider]  omeprazole (PRILOSEC) 20 MG capsule Take 20 mg by mouth 2 (two) times daily. Patient not taking: No sig reported 08/31/16   [provider]  traMADol (ULTRAM) 50 MG tablet Take 1-2 tablets (50-100 mg total) by mouth every 6 (six) hours as needed for moderate pain. Patient not taking: No sig reported 09/17/16   Crist Fat, MD     Exam: Current vital signs: BP 131/77   Pulse (!) 111   Resp (!) 24   Ht 4\' 11"  (1.499 m)   Wt 51.9 kg   SpO2 95%   BMI 23.13 kg/m    Physical Exam  Constitutional: Appears well-developed and well-nourished.  Psych: Affect appropriate to situation Eyes: No scleral injection HENT: No OP obstrucion Head: Normocephalic.  Cardiovascular: Normal rate and regular rhythm.  Respiratory: Effort normal and breath sounds normal to anterior ascultation GI: Soft.  No distension. There is no tenderness.  He has a previous incision with a small amount of fluid leaking from the bottom. Skin: WDI  Neuro: Mental Status: Patient is awake, alert, able to follow commands and answers his son, exam is somewhat limited due to language barrier and lack of interpreter. Cranial Nerves: II: He does not blink to threat from either direction but does clearly fixate and track pupils are equal, round, and reactive to light.   III,IV, VI: He has a right gaze preference, does not cross midline to the left but does return to midline VII: Facial  movement with left-sided weakness VIII: hearing is intact to voice Motor: He has severe 1/5 weakness of the left arm and leg, moves the right arm and leg well. Sensory: Sensation appears to be diminished on the left Cerebellar: No clear ataxia in the right   I have reviewed labs in epic and the pertinent results are: Creatinine 1.1, CMP otherwise unremarkable Leukocytosis of 15.7 Borderline anemia at 12.6, microcytic  I have reviewed the images obtained: CT head-subcortical hemorrhage on the  right  Primary Diagnosis:  Nontraumatic intracerebral hemorrhage in hemisphere, subcortical  Secondary Diagnosis: Hypertension Emergency (SBP > 180 or DBP > 120 & end organ damage) and Type 2 diabetes mellitus w/o complications   Impression: 60 year old male with history of hypertension who presents with subcortical hematoma likely due to hypertension.  He will need aggressive blood pressure control and has been started on a Cleviprex drip and will need to be monitored in the intensive care unit.  Also, he apparently has been leaking fluid from his previous incision for quite a long time, could consider discussing with urology on a nonurgent basis.  Plan: 1) Admit to ICU 2) no antiplatelets or anticoagulants 3) blood pressure control with goal systolic 120 - 140 4) Frequent neuro checks 5) If symptoms worsen or there is decreased mental status, repeat stat head CT 6) PT,OT,ST  This patient is critically ill and at significant risk of neurological worsening, death and care requires constant monitoring of vital signs, hemodynamics,respiratory and cardiac monitoring, neurological assessment, discussion with family, other specialists and medical decision making of high complexity. I spent 55 minutes of neurocritical care time  in the care of  this patient. This was time spent independent of any time provided by nurse practitioner or PA.  Ritta Slot, MD [facility] [phone]  If 7pm- 7am, please page neurology on call as listed in AMION.

## 2020-04-07 NOTE — ED Notes (Signed)
Pt is nauseated at this time

## 2020-04-07 NOTE — Progress Notes (Signed)
OT Cancellation Note  Patient Details Name: Anthony Huber MRN: [id number] DOB: 09-Oct-1960   Cancelled Treatment:    Reason Eval/Treat Not Completed: Active bedrest order   Eber Jones., OTR/L Acute Rehabilitation Services Pager [phone] Office [phone]  Jeani Hawking M 04/07/2020, 7:10 AM

## 2020-04-07 NOTE — Progress Notes (Signed)
PT Cancellation Note  Patient Details Name: Shawnmichael Parenteau MRN: [id number] DOB: November 18, 1960   Cancelled Treatment:    Reason Eval/Treat Not Completed: Active bedrest order. Secure chat sent to RN to notify PT if bedrest orders are removed later this date. Will follow-up as appropriate another day or if bedrest orders are removed as time permits.   Raymond Gurney, PT, DPT Acute Rehabilitation Services  Pager: [phone] Office: [phone]    Jewel Baize 04/07/2020, 8:15 AM

## 2020-04-07 NOTE — ED Notes (Signed)
Dr Roda Shutters at bedside to evaluate pt

## 2020-04-07 NOTE — Progress Notes (Addendum)
STROKE TEAM PROGRESS NOTE   INTERVAL HISTORY His son is at the bedside.    60 y.o. male with a history of hypertension, diabetes, and CAD who developed acute nausea and headache yesterday.  He was going to take an advil when he noticed left arm weakness.  EMS was called and he was taken to the ED.  CT head acute intraparenchymal hemorrhage centered at the right basal ganglia, estimated volume 18 cc.  Patient was hypertensive on admission.  Now requiring cleviprex infusion.       Vitals:   04/07/20 1445 04/07/20 1500 04/07/20 1530 04/07/20 1547  BP: (!) 147/83 (!) 152/81 (!) 152/81   Pulse: (!) 102 (!) 108 (!) 107   Resp: 19 (!) 23 17   Temp:    99.1 F (37.3 C)  TempSrc:    Oral  SpO2: 96% 95% 96%   Weight:      Height:       CBC:  Recent Labs  Lab 04/06/20 2350 04/07/20 0056  WBC 15.7*  --   NEUTROABS 8.8*  --   HGB 12.6* 13.9  HCT 40.0 41.0  MCV 71.0*  --   PLT 301  --    Basic Metabolic Panel:  Recent Labs  Lab 04/06/20 2350 04/07/20 0056  NA 138 139  K 4.1 3.9  CL 107 109  CO2 22  --   GLUCOSE 105* 125*  BUN 15 21  CREATININE 1.10 0.90  CALCIUM 8.6*  --    Lipid Panel: No results for input(s): CHOL, TRIG, HDL, CHOLHDL, VLDL, LDLCALC in the last 168 hours. HgbA1c: No results for input(s): HGBA1C in the last 168 hours. Urine Drug Screen: No results for input(s): LABOPIA, COCAINSCRNUR, LABBENZ, AMPHETMU, THCU, LABBARB in the last 168 hours.  Alcohol Level No results for input(s): ETH in the last 168 hours.  IMAGING past 24 hours CT HEAD WO CONTRAST Result Date: 04/07/2020  IMPRESSION:  1. Acute right basal ganglia hemorrhage not significantly changed since last night, estimated blood volume 22 mL. No intraventricular or extra-axial extension. Stable surrounding edema and mild regional mass effect.  2. No new intracranial abnormality.  CT HEAD CODE STROKE WO CONTRAST Result Date: 04/06/2020 IMPRESSION: 1. 4.3 x 2.4 x 3.4 cm acute intraparenchymal hemorrhage  centered at the right basal ganglia, estimated volume 18 cc. Localized mass effect with up to 5 mm of right-to-left shift.  2. Underlying moderate chronic microvascular ischemic disease.    PHYSICAL EXAM Constitutional: Appears well-developed and well-nourished.  Psych: Affect appropriate to situation Eyes: No scleral injection HENT: No OP obstrucion Head: Normocephalic.  Cardiovascular: Normal rate and regular rhythm.  Respiratory: Effort normal and breath sounds normal to anterior ascultation GI: Soft.  No distension. There is no tenderness.  He has a previous incision with a small amount of fluid leaking from the bottom. Skin: WDI  Neuro: Mental Status: Patient is mildly lethargic but awake, alert, able to follow commands and answers his son, exam is somewhat limited due to language barrier and lack of interpreter. Cranial Nerves: II: He does not blink to threat on the left, pupils are equal, round, and reactive to light.   III,IV, VI: He has a right gaze preference unable to cross midline VII: left facial droop VIII: hearing is intact to voice Motor: RUE 4-/5 spontaneous, no drift left UE flaccid RLE 3-4/5 proximal and distal LLE 2/5 withdraw with pain stimulation  Sensory: Sensation appears to be diminished on the left Cerebellar: No clear ataxia in  the right  ASSESSMENT/PLAN Mr. Jazmine Longshore is a 60 y.o. male with history of hypertension, DM, CAD presenting with subcortical hematoma likely due to hypertension.   ICH - acute right basal ganglia hemorrhage   CT head: 4.3 x 2.4 x 3.4 cm acute intraparenchymal hemorrhage centered at the right basal ganglia, estimated volume 18 cc. Localized mass effect with up to 5 mm of right-to-left shift.  MRI  Pending in the am  MRA  Pending in the am  2D Echo pending  LDL pending  HgbA1c pending  UDS pending  VTE prophylaxis - SCDs only for now 2/2 hemorrhage  No antithrombotic prior to admission, now on No antithrombotic  due to ICH.   Therapy recommendations:  pending  Disposition:  pending  Hypertensive emergency  Home meds:  None  On cleviprex infusion . SBP goal <160 in the setting of hemorrhage . Long-term BP goal normotensive  Hyperlipidemia  Home meds: none  LDL pending, goal < 70  Continue statin at discharge  Diabetes type II Controlled  Home meds:  none  HgbA1c pending, goal < 7.0  CBGs  SSI  Close PCP follow-up  Other Stroke Risk Factors  Former Cigarette smoker quit 6 years ago  Coronary artery disease (will need to obtain history from family)  Other active issue  Leukocytosis WBC 15.7  Hospital day # 1  Lissy Olivencia-Simmons, ACNP-BC Stroke Nurse practitioner 04/07/2020  ATTENDING NOTE: I reviewed above note and agree with the assessment and plan. Pt was seen and examined.   60 year old male with history of hypertension, diabetes, CAD admitted for left-sided weakness and headache.  CT head showed right BG ICH.  Repeat CT showed stable hematoma.  MRI and MRA pending in a.m.  2D echo pending, LDL A1c pending, UDS pending.  Creatinine 0.9, WBC 15.7.  On exam, son as interpreter, pt mildly lethargic, but awake, alert, eyes open, orientated to age, place, time and people. Limited language outpt, paucity of speech and only answers orientated questions, no spontaneous speech, following limited simple commands on the right, partly due to language barrier. Right gaze preference and not cross midline, tracking on the right but not left, not blinking to visual threat on the left, PERRL. Left facial droop. Tongue protrusion not cooperative. RUE no drift, left UE flaccid. RLE at least 3-4/5 proximal and distal, but LLE the most 2/5 with pain stimulation, not moving toes. Sensation symmetrical bilaterally subjectively, b/l FTN intact not cooperative, gait not tested.   Etiology for patient hemorrhage likely due to uncontrolled risk factors especially hypertension.  Currently on  Cleviprex, BP goal less than 160.  Still n.p.o., did not pass swallow.  On IV fluid.  Once passed swallow, will start p.o. BP meds to taper off Cleviprex.  For detailed assessment and plan, please refer to above as I have made changes wherever appropriate.   Marvel Plan, MD PhD Stroke Neurology 04/07/2020 4:37 PM  This patient is critically ill due to ICH, hypertensive emergency and at significant risk of neurological worsening, death form hematoma expansion, cerebral edema, brain herniation, seizure, hypertensive encephalopathy. This patient's care requires constant monitoring of vital signs, hemodynamics, respiratory and cardiac monitoring, review of multiple databases, neurological assessment, discussion with family, other specialists and medical decision making of high complexity. I spent 40 minutes of neurocritical care time in the care of this patient.      To contact Stroke Continuity provider, please refer to [email or web address]. After hours, contact General Neurology

## 2020-04-07 NOTE — ED Notes (Signed)
Pt returned from ct scan, pt vomited small amount

## 2020-04-07 NOTE — ED Notes (Signed)
Pt has an incision scar below his naval that his son says is from a kidney stone removal years ago. The bottom of the incision is leaking clear fluid. Dr. Amada Jupiter made aware.

## 2020-04-07 NOTE — ED Notes (Signed)
Dr Roda Shutters secured messaged  Could you please come evaluate pt. Numerous episodes of vomiting, nausea, HR and respirations are increasing slightly

## 2020-04-07 NOTE — ED Notes (Signed)
This RN called the [place] interpreter on the phone to assist with swallow screen, no answer. This RN tried to get pt son to assist with swallow screen but language barrier present and pt is lethargic and will smile but does not stick out tongue. This RN will update the MD and pass this information a long to next RN

## 2020-04-07 NOTE — Code Documentation (Signed)
Responded to Code Stroke called at 2248 for L sided deficits. Per EMS, pt developed sudden onset headache/vomiting and was unable to move L side or speak, [id number]. Pt arrived at 2302, CBG-102, NIH-13, CT head-ICH. Pt taken back to ED room and started on cleviprex gtt, goal SBP<140. Plan to admit to ICU.

## 2020-04-08 ENCOUNTER — Inpatient Hospital Stay (HOSPITAL_COMMUNITY): Payer: BC Managed Care – PPO

## 2020-04-08 DIAGNOSIS — I6389 Other cerebral infarction: Secondary | ICD-10-CM | POA: Diagnosis not present

## 2020-04-08 DIAGNOSIS — I674 Hypertensive encephalopathy: Secondary | ICD-10-CM

## 2020-04-08 DIAGNOSIS — I610 Nontraumatic intracerebral hemorrhage in hemisphere, subcortical: Secondary | ICD-10-CM | POA: Diagnosis not present

## 2020-04-08 LAB — URINALYSIS, ROUTINE W REFLEX MICROSCOPIC
Bilirubin Urine: NEGATIVE
Glucose, UA: NEGATIVE mg/dL
Ketones, ur: NEGATIVE mg/dL
Nitrite: NEGATIVE
Protein, ur: 30 mg/dL — AB
Specific Gravity, Urine: 1.015 (ref 1.005–1.030)
pH: 5 (ref 5.0–8.0)

## 2020-04-08 LAB — CBC
HCT: 41.5 % (ref 39.0–52.0)
Hemoglobin: 13.6 g/dL (ref 13.0–17.0)
MCH: 22.6 pg — ABNORMAL LOW (ref 26.0–34.0)
MCHC: 32.8 g/dL (ref 30.0–36.0)
MCV: 69.1 fL — ABNORMAL LOW (ref 80.0–100.0)
Platelets: 307 10*3/uL (ref 150–400)
RBC: 6.01 MIL/uL — ABNORMAL HIGH (ref 4.22–5.81)
RDW: 16.1 % — ABNORMAL HIGH (ref 11.5–15.5)
WBC: 14.7 10*3/uL — ABNORMAL HIGH (ref 4.0–10.5)
nRBC: 0 % (ref 0.0–0.2)

## 2020-04-08 LAB — TRIGLYCERIDES: Triglycerides: 238 mg/dL — ABNORMAL HIGH (ref <150)

## 2020-04-08 LAB — BASIC METABOLIC PANEL
Anion gap: 12 (ref 5–15)
BUN: 21 mg/dL (ref 8–23)
CO2: 23 mmol/L (ref 22–32)
Calcium: 8.7 mg/dL — ABNORMAL LOW (ref 8.9–10.3)
Chloride: 104 mmol/L (ref 98–111)
Creatinine, Ser: 1.25 mg/dL — ABNORMAL HIGH (ref 0.61–1.24)
GFR, Estimated: >60 mL/min (ref >60)
Glucose, Bld: 138 mg/dL — ABNORMAL HIGH (ref 70–99)
Potassium: 3.9 mmol/L (ref 3.5–5.1)
Sodium: 139 mmol/L (ref 135–145)

## 2020-04-08 LAB — ECHOCARDIOGRAM COMPLETE
Area-P 1/2: 3.6 cm2
Height: 59 in
S' Lateral: 2.6 cm
Weight: 1832 oz

## 2020-04-08 LAB — GLUCOSE, CAPILLARY
Glucose-Capillary: 130 mg/dL — ABNORMAL HIGH (ref 70–99)
Glucose-Capillary: 121 mg/dL — ABNORMAL HIGH (ref 70–99)

## 2020-04-08 MED ORDER — HEPARIN SODIUM (PORCINE) 5000 UNIT/ML IJ SOLN
5000.0000 [IU] | Freq: Two times a day (BID) | INTRAMUSCULAR | Status: DC
  Administered 2020-04-08 – 2020-04-11 (×6): 5000 [IU] via SUBCUTANEOUS
  Filled 2020-04-08 (×6): qty 1

## 2020-04-08 MED ORDER — METOPROLOL TARTRATE 50 MG PO TABS
50.0000 mg | ORAL_TABLET | Freq: Two times a day (BID) | ORAL | Status: DC
  Administered 2020-04-08 – 2020-04-09 (×2): 50 mg via ORAL
  Filled 2020-04-08 (×2): qty 1

## 2020-04-08 MED ORDER — IOHEXOL 300 MG/ML  SOLN
125.0000 mL | Freq: Once | INTRAMUSCULAR | Status: AC | PRN
  Administered 2020-04-08: 125 mL via INTRAVENOUS

## 2020-04-08 MED ORDER — AMLODIPINE BESYLATE 10 MG PO TABS
10.0000 mg | ORAL_TABLET | Freq: Every day | ORAL | Status: DC
  Administered 2020-04-08 – 2020-04-11 (×4): 10 mg via ORAL
  Filled 2020-04-08 (×4): qty 1

## 2020-04-08 MED ORDER — LABETALOL HCL 5 MG/ML IV SOLN
20.0000 mg | INTRAVENOUS | Status: DC | PRN
  Administered 2020-04-08: 20 mg via INTRAVENOUS
  Filled 2020-04-08 (×2): qty 4

## 2020-04-08 MED ORDER — INSULIN ASPART 100 UNIT/ML ~~LOC~~ SOLN
0.0000 [IU] | Freq: Three times a day (TID) | SUBCUTANEOUS | Status: DC
  Administered 2020-04-09: 1 [IU] via SUBCUTANEOUS

## 2020-04-08 MED ORDER — METOPROLOL TARTRATE 25 MG PO TABS
25.0000 mg | ORAL_TABLET | Freq: Two times a day (BID) | ORAL | Status: DC
  Administered 2020-04-08: 25 mg via ORAL
  Filled 2020-04-08: qty 1

## 2020-04-08 MED ORDER — HYDRALAZINE HCL 20 MG/ML IJ SOLN
10.0000 mg | INTRAMUSCULAR | Status: DC | PRN
  Administered 2020-04-09 (×2): 10 mg via INTRAVENOUS
  Filled 2020-04-08 (×2): qty 1

## 2020-04-08 MED ORDER — PANTOPRAZOLE SODIUM 40 MG PO TBEC
40.0000 mg | DELAYED_RELEASE_TABLET | Freq: Every day | ORAL | Status: DC
  Administered 2020-04-08 – 2020-04-11 (×4): 40 mg via ORAL
  Filled 2020-04-08 (×4): qty 1

## 2020-04-08 NOTE — Progress Notes (Addendum)
STROKE TEAM PROGRESS NOTE   INTERVAL HISTORY His son is at the bedside.    Tachycardia and mild tachypneic this am.  Afebrile.  WBC down to 14.7 from 15.7.  Will obtain Xray and UA. BP improving.  Will add amlodipine and metoprolol and wean off cleviprex infusion.  Patient has a long standing issue with kidney infection and kidney stones requiring multiple procedures. Has urine draining from old lower abdominal incision.  Unclear history obtained from family.  Urology consult today.    Vitals:   04/08/20 1145 04/08/20 1200 04/08/20 1300 04/08/20 1311  BP: (!) 145/110 (!) 168/91 138/89 (!) 164/88  Pulse: (!) 109 (!) 115 100 (!) 114  Resp: (!) 22 (!) 24 18   Temp:  98.7 F (37.1 C)    TempSrc:  Oral    SpO2: 97% 98% 97%   Weight:      Height:       CBC:  Recent Labs  Lab 04/06/20 2350 04/07/20 0056 04/08/20 0707  WBC 15.7*  --  14.7*  NEUTROABS 8.8*  --   --   HGB 12.6* 13.9 13.6  HCT 40.0 41.0 41.5  MCV 71.0*  --  69.1*  PLT 301  --  307   Basic Metabolic Panel:  Recent Labs  Lab 04/06/20 2350 04/07/20 0056 04/08/20 0707  NA 138 139 139  K 4.1 3.9 3.9  CL 107 109 104  CO2 22  --  23  GLUCOSE 105* 125* 138*  BUN 15 21 21   CREATININE 1.10 0.90 1.25*  CALCIUM 8.6*  --  8.7*   Lipid Panel:  Recent Labs  Lab 04/07/20 1644 04/08/20 0707  CHOL 173  --   TRIG 238* 238*  HDL 36*  --   CHOLHDL 4.8  --   VLDL 48*  --   LDLCALC 89  --    HgbA1c:  Recent Labs  Lab 04/06/20 2350  HGBA1C 5.8*   Urine Drug Screen: No results for input(s): LABOPIA, COCAINSCRNUR, LABBENZ, AMPHETMU, THCU, LABBARB in the last 168 hours.  Alcohol Level No results for input(s): ETH in the last 168 hours.  IMAGING past 24 hours  MRI  Result Date: 04/08/2020 1. Stable size and morphology of right basal ganglia intraparenchymal hemorrhage with similar surrounding vasogenic edema and regional mass effect. Associated 5 mm right-to-left shift, stable. No intraventricular extension,  hydrocephalus, or ventricular trapping. No appreciable underlying lesion on this noncontrast examination. 2. Underlying mild chronic microvascular ischemic disease.  MRA Head Result Date: 04/08/2020 1. Negative intracranial MRA. No intracranial aneurysm or other vascular abnormality seen underlying the right basal ganglia hemorrhage. No large vessel occlusion. 2. 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 No other hemodynamically significant stenosis. 3. 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 Fetal type origin of the right PCA.  Echo 04/08/2020 1. Left ventricular ejection fraction, by estimation, is 65 to 70%. The  left ventricle has normal function. The left ventricle has no regional  wall motion abnormalities. Left ventricular diastolic parameters were  normal.  2. Right ventricular systolic function is normal. The right ventricular  size is normal. There is mildly elevated pulmonary artery systolic  pressure.  3. The mitral valve is normal in structure. Trivial mitral valve  regurgitation. No evidence of mitral stenosis.  4. The aortic valve is normal in structure. Aortic valve regurgitation is  not visualized. No aortic stenosis is present.  5. The inferior vena cava is normal in size with greater than 50%  respiratory  variability, suggesting right atrial pressure of 3 mmHg.   CT HEAD WO CONTRAST Result Date: 04/07/2020  IMPRESSION:  1. Acute right basal ganglia hemorrhage not significantly changed since last night, estimated blood volume 22 mL. No intraventricular or extra-axial extension. Stable surrounding edema and mild regional mass effect.  2. No new intracranial abnormality.  CT HEAD CODE STROKE WO CONTRAST Result Date: 04/06/2020 IMPRESSION: 1. 4.3 x 2.4 x 3.4 cm acute intraparenchymal hemorrhage centered at the right basal ganglia, estimated volume 18 cc. Localized mass effect with up to 5 mm of right-to-left shift.  2. Underlying  moderate chronic microvascular ischemic disease.    PHYSICAL EXAM Constitutional: Appears well-developed and well-nourished.  Psych: Affect appropriate to situation Eyes: No scleral injection HENT: No OP obstrucion Head: Normocephalic.  Cardiovascular: Tachycardia  Respiratory: Effort normal and breath sounds normal to anterior ascultation GI: Soft.  No distension. There is no tenderness.  He has a previous incision with a small amount of fluid leaking from the bottom aspect. Skin: WDI  Neuro: Mental Status: Patient is mildly lethargic but awake, alert, able to follow commands and answers orientation questions appropriately in his language with an interpreter. Cranial Nerves: II: He does not blink to threat on the left, pupils are equal, round, and reactive to light.   III,IV, VI: He has a right gaze preference unable to cross midline VII: left facial droop VIII: hearing is intact to voice Motor: RUE 4-/5 spontaneous, no drift LUE flaccid RLE 4/5 proximal and distal LLE 2/5 withdraw with pain stimulation  Sensory: Sensation appears to be diminished on the left Cerebellar: No clear ataxia in the right  ASSESSMENT/PLAN Mr. Anthony Huber is a 60 y.o. male with history of hypertension, DM, CAD presenting with subcortical hematoma likely due to hypertension.   ICH - acute right BG hemorrhage - likely due to uncontrolled HTN  CT head: 4.3 x 2.4 x 3.4 cm acute intraparenchymal hemorrhage centered at the right basal ganglia, estimated volume 18 cc. Localized mass effect with up to 5 mm of right-to-left shift.  MRI: No underlying lesion. Stable right BG hemorrhage with vasogenic edema and right to left shift stable    MRA: No aneurysm,no vascular abnormality, moderate stenosis of mid basilar artery. 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   Therapy recommendations:  OT/PT CIR  Disposition:  pending  Hypertensive emergency  Home meds:  None  On cleviprex infusion, wean off as able  Add amlodipine 10mg  daily, metoprolol 25mg  bid . SBP goal <160 in the setting of hemorrhage . Long-term BP goal normotensive  Hyperlipidemia  Home meds: none  LDL 89, goal < 70  Consider statin at discharge  Diabetes type II Controlled  Home meds:  none  HgbA1c 5.8, goal < 7.0  CBGs  SSI  Close PCP follow-up  Urinary incontinence   Hx of multiple kidney infections, kidney stones, multiple surgeries.   S/p left NEPHROLITHOTOMY PERCUTANEOUS and Laser endoureterotomy 09/2016 with Dr. at [facility]  Currently has urine draining from lower abdominal incision.   Inconsistent history from wife and son with interpreter present.    Urology consulted for further evaluation and management.  AKI  Creatinine 1.1->0.9->1.25  IV fluid  Encourage p.o. intake  BP monitoring  Other Stroke Risk Factors  Former Cigarette smoker quit 6  years ago  Coronary artery disease: Coronary angioplasty with stent placement  Other active issue  Leukocytosis WBC 15.7>14.7  Hospital day # 2   ATTENDING NOTE: I reviewed above note and agree with the assessment and plan. Pt was seen and examined.   Patient neuro stable, overnight no acute event.  Patient wife at bedside.  Patient still has right gaze preference, left facial droop, left hemiplegia. Left LE 1/5 today.  Still on Cleviprex, started on p.o. BP meds in order to taper off Cleviprex.  Creatinine 1.25, encourage p.o. intake continue IV fluid.  Patient has urine draining from lower abdomen previous incision, will consult neurology.  For detailed assessment and plan, please refer to above as I have made changes wherever appropriate.   Marvel Plan, MD PhD Stroke Neurology 04/08/2020 3:17 PM  This patient is critically ill due to right BG  ICH, hypertensive emergency and at significant risk of neurological worsening, death form hematoma expansion, cerebral edema, brain herniation, IVH, seizure, hypertensive encephalopathy. This patient's care requires constant monitoring of vital signs, hemodynamics, respiratory and cardiac monitoring, review of multiple databases, neurological assessment, discussion with family, other specialists and medical decision making of high complexity. I spent 35 minutes of neurocritical care time in the care of this patient.   To contact Stroke Continuity provider, please refer to [email or web address]. After hours, contact General Neurology

## 2020-04-08 NOTE — Consult Note (Signed)
I have been asked to see the patient by Dr. Arbutus Leas, for evaluation and management of urine leak.  History of present illness:   60 yo man with currently admitted to hospital with large right sided hemorrhagic stroke.  Urology was consulted due to findings of urine coming from lower midline abdominal incision.  Per son, he has been leaking urine from this incision since 2005.  Patient has history of open ureterolithotomy in [place] as well as LEFT PCNL and laser ureterotomy October 2018 by Dr. Marlou Porch.     Review of systems: A 12 point comprehensive review of systems was obtained and is negative unless otherwise stated in the history of present illness.  Patient Active Problem List   Diagnosis Date Noted  . ICH (intracerebral hemorrhage) (HCC) 04/06/2020  . H/O nephrolithotomy with removal of calculi 09/16/2016  . Nephrolithiasis 09/05/2016    No current facility-administered medications on file prior to encounter.   Current Outpatient Medications on File Prior to Encounter  Medication Sig Dispense Refill  . ibuprofen (ADVIL) 200 MG tablet Take 200 mg by mouth every 6 (six) hours as needed for headache or moderate pain.    . Pseudoeph-Doxylamine-DM-APAP (NYQUIL PO) Take 1 Dose by mouth daily as needed (sore throat/congestion).    . Pseudoephedrine-APAP-DM (DAYQUIL PO) Take 1 Dose by mouth daily as needed (sore throat/congestion).    . naproxen (NAPROSYN) 500 MG tablet Take 500 mg by mouth 2 (two) times daily as needed for pain. (Patient not taking: No sig reported)  0  . omeprazole (PRILOSEC) 20 MG capsule Take 20 mg by mouth 2 (two) times daily. (Patient not taking: No sig reported)  0  . traMADol (ULTRAM) 50 MG tablet Take 1-2 tablets (50-100 mg total) by mouth every 6 (six) hours as needed for moderate pain. (Patient not taking: No sig reported) 30 tablet 0    Past Medical History:  Diagnosis Date  . Coronary artery disease   . Diabetes mellitus without complication (HCC)   .  Hypertension     Past Surgical History:  Procedure Laterality Date  . BACK SURGERY    . BLADDER SURGERY    . CORONARY ANGIOPLASTY WITH STENT PLACEMENT    . CYSTOSCOPY WITH STENT PLACEMENT Left 09/05/2016   Procedure: CYSTOSCOPY, URETEROSCOPY;  Surgeon: Crist Fat, MD;  Location: [facility];  Service: Urology;  Laterality: Left;  . IR NEPHROSTOMY PLACEMENT LEFT  09/06/2016  . NEPHROLITHOTOMY Left 09/16/2016   Procedure: NEPHROLITHOTOMY PERCUTANEOUS  LEFT;  Surgeon: Crist Fat, MD;  Location: [facility];  Service: Urology;  Laterality: Left;    Social History   Tobacco Use  . Smoking status: Former Smoker    Quit date: 2016    Years since quitting: 6.2  . Smokeless tobacco: Never Used  Vaping Use  . Vaping Use: Never used  Substance Use Topics  . Alcohol use: No  . Drug use: No    No family history on file.  PE: Vitals:   04/08/20 1000 04/08/20 1115 04/08/20 1145 04/08/20 1200  BP: 140/76 (!) 155/71 (!) 145/110 (!) 168/91  Pulse: (!) 103 (!) 110 (!) 109 (!) 115  Resp: 18 (!) 22 (!) 22 (!) 24  Temp:    98.7 F (37.1 C)  TempSrc:    Oral  SpO2: 98% 98% 97% 98%  Weight:      Height:       Patient appears to be in no acute distress  Atraumatic normocephalic head No increased work of  breathing Regular sinus rhythm/rate Abdomen is soft, nontender, nondistended, lower vertical midline incision with purewick overlying it and what appears to be urine draining GU: condom catheter attached, scant urine in tubing No identifiable skin lesions  Recent Labs    04/06/20 2350 04/07/20 0056 04/08/20 0707  WBC 15.7*  --  14.7*  HGB 12.6* 13.9 13.6  HCT 40.0 41.0 41.5   Recent Labs    04/06/20 2350 04/07/20 0056 04/08/20 0707  NA 138 139 139  K 4.1 3.9 3.9  CL 107 109 104  CO2 22  --  23  GLUCOSE 105* 125* 138*  BUN 15 21 21   CREATININE 1.10 0.90 1.25*  CALCIUM 8.6*  --  8.7*   Recent Labs    04/06/20 2350  INR 0.9   No results for input(s): LABURIN in  the last 72 hours. Results for orders placed or performed during the hospital encounter of 04/06/20  Resp Panel by RT-PCR (Flu A&B, Covid) Nasopharyngeal Swab     Status: None   Collection Time: 04/07/20 12:20 AM   Specimen: Nasopharyngeal Swab; Nasopharyngeal(NP) swabs in vial transport medium  Result Value Ref Range Status   SARS Coronavirus 2 by RT PCR NEGATIVE NEGATIVE Final    Comment: (NOTE) SARS-CoV-2 target nucleic acids are NOT DETECTED.  The SARS-CoV-2 RNA is generally detectable in upper respiratory specimens during the acute phase of infection. The lowest concentration of SARS-CoV-2 viral copies this assay can detect is 138 copies/mL. A negative result does not preclude SARS-Cov-2 infection and should not be used as the sole basis for treatment or other patient management decisions. A negative result may occur with  improper specimen collection/handling, submission of specimen other than nasopharyngeal swab, presence of viral mutation(s) within the areas targeted by this assay, and inadequate number of viral copies(<138 copies/mL). A negative result must be combined with clinical observations, patient history, and epidemiological information. The expected result is Negative.  Fact Sheet for Patients:  06/07/20  Fact Sheet for Healthcare Providers:  BloggerCourse.com  This test is no t yet approved or cleared by the [employer]iousBroker.it FDA and  has been authorized for detection and/or diagnosis of SARS-CoV-2 by FDA under an Emergency Use Authorization (EUA). This EUA will remain  in effect (meaning this test can be used) for the duration of the COVID-19 declaration under Section 564(b)(1) of the Act, 21 U.S.C.section 360bbb-3(b)(1), unless the authorization is terminated  or revoked sooner.       Influenza A by PCR NEGATIVE NEGATIVE Final   Influenza B by PCR NEGATIVE NEGATIVE Final    Comment: (NOTE) The Xpert  Xpress SARS-CoV-2/FLU/RSV plus assay is intended as an aid in the diagnosis of influenza from Nasopharyngeal swab specimens and should not be used as a sole basis for treatment. Nasal washings and aspirates are unacceptable for Xpert Xpress SARS-CoV-2/FLU/RSV testing.  Fact Sheet for Patients: [place]  Fact Sheet for Healthcare Providers: BloggerCourse.com  This test is not yet approved or cleared by the SeriousBroker.it FDA and has been authorized for detection and/or diagnosis of SARS-CoV-2 by FDA under an Emergency Use Authorization (EUA). This EUA will remain in effect (meaning this test can be used) for the duration of the COVID-19 declaration under Section 564(b)(1) of the Act, 21 U.S.C. section 360bbb-3(b)(1), unless the authorization is terminated or revoked.  Performed at [facility], [address], [place], [place]   MRSA PCR Screening     Status: None   Collection Time: 04/07/20  4:30 PM   Specimen: Nasopharyngeal  Result Value Ref Range Status   MRSA by PCR NEGATIVE NEGATIVE Final    Comment:        The GeneXpert MRSA Assay (FDA approved for NASAL specimens only), is one component of a comprehensive MRSA colonization surveillance program. It is not intended to diagnose MRSA infection nor to guide or monitor treatment for MRSA infections. Performed at [facility], 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  -recommend abdominal imaging with CT urogram to further evaluate communication between GU tract and skin -pt may benefit from foley catheter and/or PCNL on left side pending CT results -will follow up after CT scan has been obtained  Thank you for involving me in this patient's care.  Please page with any further questions or concerns. Joyelle Siedlecki D Kaiyan Luczak

## 2020-04-08 NOTE — Progress Notes (Signed)
OT Cancellation Note  Patient Details Name: Legacy Lacivita MRN: [id number] DOB: [birth year]   Cancelled Treatment:    Reason Eval/Treat Not Completed: Active bedrest order. Will return as schedule allows.   Rennae Ferraiolo M Mayer Vondrak Makael Stein MSOT, OTR/L Acute Rehab Pager: [phone] Office: [phone] 04/08/2020, 7:27 AM

## 2020-04-08 NOTE — Progress Notes (Signed)
Inpatient Rehab Admissions Coordinator Note:   Per PT/OT recommendations, pt was screened for CIR candidacy by Wolfgang Phoenix, MS, CCC-SLP.  At this time we are recommending an inpatient rehab consult.  AC will contact attending to request consult order.  Please contact me with questions.    Wolfgang Phoenix, MS, CCC-SLP Admissions Coordinator [phone] 04/08/20 5:13 PM

## 2020-04-08 NOTE — Evaluation (Signed)
Occupational Therapy Evaluation Patient Details Name: Anthony Huber MRN: [id number] DOB: [birth year] Today's Date: 04/08/2020    History of Present Illness 60 y.o. M admitted 4/3 with nausea, HA, and L arm weakness. 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 Significant PMH: HTN, diabetes, CAD.   Clinical Impression   PTA, pt was living with his wife and was independent and working. Pt currently requiring Max-Total A for ADLs and Max A +2 for functional transfers. Pt presenting with deficits in vision, balance, strength, cognition, and activity tolerance. Pt performing sit<>stand from recliner with Max A +2; x2. Despite fatigue, pt very motivated to participate in therapy and wife is very supportive. Pt would benefit from further acute OT to facilitate safe dc. Recommend dc to CIR for intensive OT to optimize safety, independence with ADLs, and return to PLOF.   In person Montagnard interpreter Luberta Robertson utilized for this session    Follow Up Recommendations  CIR    Equipment Recommendations  Other (comment) (Defer to next venue)    Recommendations for Other Services PT consult;Rehab consult;Speech consult     Precautions / Restrictions Precautions Precautions: Fall Precaution Comments: L hemi, neglect Restrictions Weight Bearing Restrictions: No      Mobility Bed Mobility Overal bed mobility: Needs Assistance Bed Mobility: Supine to Sit     Supine to sit: Max assist;+2 for physical assistance     General bed mobility comments: Assist for trunk to upright    Transfers Overall transfer level: Needs assistance Equipment used: None Transfers: Sit to/from [employer] Sit to Stand: Max assist;+2 physical assistance   Squat pivot transfers: Max assist;+2 physical assistance     General transfer comment: MaxA + 2 for low pivot transfer to right. Pt then able to perform x 2 stands from chair, one with face to face transfer and one  with +2. Left knee block provided. pt able to initiate, but demonstrates left lateral lean. Tactile cues provided for midline positioning and upright posture. Mild-mod left knee buckle    Balance Overall balance assessment: Needs assistance Sitting-balance support: Feet supported Sitting balance-Leahy Scale: Poor Sitting balance - Comments: Left lateral lean, up to maxA for sitting balance Postural control: Left lateral lean   Standing balance-Leahy Scale: Poor Standing balance comment: up to maxA in standing, left lateral lean                           ADL either performed or assessed with clinical judgement   ADL Overall ADL's : Needs assistance/impaired Eating/Feeding: NPO   Grooming: Maximal assistance;Sitting   Upper Body Bathing: Maximal assistance;Sitting   Lower Body Bathing: Total assistance;Sit to/from stand;Bed level   Upper Body Dressing : Maximal assistance;Sitting   Lower Body Dressing: Total assistance;Sit to/from stand;Bed level   Toilet Transfer: Maximal assistance;+2 for physical assistance;+2 for safety/equipment;Squat-pivot (simulated to recliner)           Functional mobility during ADLs: Maximal assistance;+2 for physical assistance;+2 for safety/equipment General ADL Comments: Pt requiring Max-Total A for ADLs due to decreased balance, cognition, and strength     Vision Baseline Vision/History: Wears glasses Wears Glasses: Reading only Patient Visual Report: No change from baseline       Perception     Praxis      Pertinent Vitals/Pain Pain Assessment: Faces Faces Pain Scale: No hurt Pain Intervention(s): Monitored during session     Hand Dominance Right   Extremity/Trunk Assessment Upper Extremity  Assessment Upper Extremity Assessment: LUE deficits/detail LUE Deficits / Details: No active movement LUE Coordination: decreased fine motor;decreased gross motor   Lower Extremity Assessment Lower Extremity Assessment: RLE  deficits/detail;LLE deficits/detail RLE Deficits / Details: WFL LLE Deficits / Details: Grossly 2/5   Cervical / Trunk Assessment Cervical / Trunk Assessment: Normal   Communication Communication Communication: Prefers language other than English (Montagnard)   Cognition Arousal/Alertness: Awake/alert Behavior During Therapy: Flat affect Overall Cognitive Status: Difficult to assess Area of Impairment: Following commands;Safety/judgement;Awareness;Orientation                 Orientation Level: Situation     Following Commands: Follows one step commands consistently Safety/Judgement: Decreased awareness of deficits Awareness: Intellectual   General Comments: Pt oriented to time and place; not oriented to situation, stating he was "sick." does not seem to have awareness of left sided deficits and demonstrates neglect   General Comments  Old superpublic cath hole; purwick placed over opening by nursing. VSS. 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        OT Problem List:        OT Treatment/Interventions: Self-care/ADL training;Therapeutic exercise;Energy conservation;DME and/or AE instruction;Therapeutic activities;Patient/family education;Balance training    OT Goals(Current goals can be found in the care plan section) Acute Rehab OT Goals Patient Stated Goal: did not state OT Goal Formulation: With patient/family Time For Goal  Achievement: 04/22/20 Potential to Achieve Goals: Good  OT Frequency: Min 2X/week   Barriers to D/C:            Co-evaluation PT/OT/SLP Co-Evaluation/Treatment: Yes Reason for Co-Treatment: For patient/therapist safety;To address functional/ADL transfers   OT goals addressed during session: ADL's and self-care      AM-PAC OT "6 Clicks" Daily Activity     Outcome Measure Help from another person eating meals?: Total Help from another person taking care of personal grooming?: A Lot Help from another person toileting, which includes using toliet, bedpan, or urinal?: A Lot Help from another person bathing (including washing, rinsing, drying)?: Total Help from another person to put on and taking off regular upper body clothing?: A Lot Help from another person to put on and taking off regular lower body clothing?: Total 6 Click Score: 9   End of Session Equipment Utilized During Treatment: Gait belt Nurse Communication: Mobility status  Activity Tolerance: Patient tolerated treatment well Patient left: in chair;with call bell/phone within reach;with chair alarm set;with family/visitor present  OT Visit Diagnosis: Unsteadiness on feet (R26.81);Other abnormalities of gait and mobility (R26.89);Muscle weakness (generalized) (M62.81);Hemiplegia and hemiparesis Hemiplegia - Right/Left: Left Hemiplegia - dominant/non-dominant: Non-Dominant Hemiplegia - caused by: Cerebral infarction                Time: 4970-2637 OT Time Calculation (min): 31 min Charges:  OT General Charges $OT Visit: 1 Visit OT Evaluation $OT Eval Moderate Complexity: 1 Mod  Drayven Marchena MSOT, OTR/L Acute Rehab  Pager: [phone] Office: [phone]  Theodoro Grist Gwyn Mehring 04/08/2020, 1:26 PM

## 2020-04-08 NOTE — Evaluation (Signed)
Physical Therapy Evaluation Patient Details Name: Anthony Huber MRN: [id number] DOB: 07-07-1960 Today's Date: 04/08/2020   History of Present Illness  Pt is a 60 y.o. M admitted 4/3 with nausea, HA, and L arm weakness. 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 Significant PMH: HTN, diabetes, CAD.  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 Pt presents with left hemiplegia, neglect, poor balance and decreased cognition. Pt requiring two person maximal assist for functional mobility. Able to perform low pivot from bed to chair and stand from chair x 2. Demonstrates min-mod left knee buckle in standing and left lateral lean. BP 166/87 post mobility. Would benefit from CIR to address deficits, maximize functional independence, and decrease caregiver burden.   In person [place]agnard interpreter Luberta Robertson utilized for this session    Follow Up Recommendations CIR    Equipment Recommendations  3in1 (PT);Wheelchair (measurements PT);Wheelchair cushion (measurements PT)    Recommendations for Other Services       Precautions / Restrictions Precautions Precautions: Fall Precaution Comments: L hemi, neglect Restrictions Weight Bearing Restrictions: No      Mobility  Bed Mobility Overal bed mobility: Needs Assistance Bed Mobility: Supine to Sit     Supine to sit: Max assist;+2 for physical assistance     General bed mobility comments: Assist for trunk to upright    Transfers Overall transfer level: Needs assistance Equipment used: None Transfers: Sit to/from [employer] Sit to Stand: Max assist;+2 physical assistance   Squat pivot transfers: Max assist;+2 physical assistance     General transfer comment: MaxA + 2 for low pivot transfer to right. Pt then able to perform x 2 stands from chair, one with face to face transfer and one with +2. Left knee block provided. pt able to initiate, but demonstrates left  lateral lean. Tactile cues provided for midline positioning and upright posture. Mild-mod left knee buckle  Ambulation/Gait                Stairs            Wheelchair Mobility    Modified Rankin (Stroke Patients Only) Modified Rankin (Stroke Patients Only) Pre-Morbid Rankin Score: No symptoms Modified Rankin: Severe disability     Balance Overall balance assessment: Needs assistance Sitting-balance support: Feet supported Sitting balance-Leahy Scale: Poor Sitting balance - Comments: Left lateral lean, up to maxA for sitting balance Postural control: Left lateral lean   Standing balance-Leahy Scale: Poor Standing balance comment: up to maxA in standing, left lateral lean                             Pertinent Vitals/Pain Pain Assessment: Faces Faces Pain Scale: No hurt    Home Living Family/patient expects to be discharged to:: Private residence Living Arrangements: Spouse/significant other;Children Available Help at Discharge: Family Type of Home: House Home Access: Level entry     Home Layout: One level Home Equipment: None      Prior Function Level of Independence: Independent         Comments: 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     Hand Dominance   Dominant Hand: Right    Extremity/Trunk Assessment   Upper Extremity Assessment Upper Extremity Assessment: Defer to OT evaluation    Lower Extremity Assessment Lower Extremity Assessment: RLE deficits/detail;LLE deficits/detail RLE Deficits / Details: WFL LLE Deficits / Details: Grossly  2/5    Cervical / Trunk Assessment Cervical / Trunk Assessment: Normal  Communication   Communication: Prefers language other than English (Montagnard)  Cognition Arousal/Alertness: Awake/alert Behavior During Therapy: Flat affect Overall Cognitive Status: Difficult to assess Area of Impairment: Following  commands;Safety/judgement;Awareness;Orientation                 Orientation Level: Situation     Following Commands: Follows one step commands consistently Safety/Judgement: Decreased awareness of deficits Awareness: Intellectual   General Comments: Pt oriented to time and place; not oriented to situation, stating he was "sick." does not seem to have awareness of left sided deficits and demonstrates neglect      General Comments      Exercises     Assessment/Plan    PT Assessment Patient needs continued PT services  PT Problem List Decreased strength;Decreased range of motion;Decreased balance;Decreased mobility;Decreased cognition;Decreased safety awareness;Impaired sensation       PT Treatment Interventions DME instruction;Gait training;Functional mobility training;Therapeutic exercise;Therapeutic activities;Balance training;Cognitive remediation;Patient/family education;Wheelchair mobility training    PT Goals (Current goals can be found in the Care Plan section)  Acute Rehab PT Goals Patient Stated Goal: did not state PT Goal Formulation: With patient/family Time For Goal Achievement: 04/22/20 Potential to Achieve Goals: Fair    Frequency Min 4X/week   Barriers to discharge        Co-evaluation               AM-PAC PT "6 Clicks" Mobility  Outcome Measure Help needed turning from your back to your side while in a flat bed without using bedrails?: Total Help needed moving from lying on your back to sitting on the side of a flat bed without using bedrails?: Total Help needed moving to and from a bed to a chair (including a wheelchair)?: Total Help needed standing up from a chair using your arms (e.g., wheelchair or bedside chair)?: Total Help needed to walk in hospital room?: Total Help needed climbing 3-5 steps with a railing? : Total 6 Click Score: 6    End of Session Equipment Utilized During Treatment: Gait belt Activity Tolerance: Patient  tolerated treatment well Patient left: in chair;with call bell/phone within reach;with chair alarm set;with family/visitor present;Other (comment) (pillow prop) Nurse Communication: Mobility status PT Visit Diagnosis: Unsteadiness on feet (R26.81);Difficulty in walking, not elsewhere classified (R26.2);Hemiplegia and hemiparesis Hemiplegia - Right/Left: Left Hemiplegia - dominant/non-dominant: Non-dominant Hemiplegia - caused by: Nontraumatic intracerebral hemorrhage    Time: 2353-6144 PT Time Calculation (min) (ACUTE ONLY): 32 min   Charges:   PT Evaluation $PT Eval Moderate Complexity: 1 Mod          Lillia Pauls, PT, DPT Acute Rehabilitation Services Pager [phone] Office [phone]   Norval Morton 04/08/2020, 12:56 PM

## 2020-04-08 NOTE — Progress Notes (Signed)
  Echocardiogram 2D Echocardiogram has been performed.  Anthony Huber 04/08/2020, 9:54 AM

## 2020-04-08 NOTE — Progress Notes (Addendum)
  Speech Language Pathology  Patient Details Name: Anthony Huber MRN: [id number] DOB: 09/17/1960 Today's Date: 04/08/2020 Time:  -      Passes Yale swallow. Plan for speech-language-cognitive assessment tomorrow around 10:30 or 11:00 (per RN), with interpreter.       Royce Macadamia 04/08/2020, 2:34 PM Breck Coons Lonell Face.Ed Nurse, children's [phone] Office [phone]

## 2020-04-09 ENCOUNTER — Inpatient Hospital Stay (HOSPITAL_COMMUNITY): Payer: BC Managed Care – PPO

## 2020-04-09 DIAGNOSIS — I610 Nontraumatic intracerebral hemorrhage in hemisphere, subcortical: Secondary | ICD-10-CM | POA: Diagnosis not present

## 2020-04-09 DIAGNOSIS — G8194 Hemiplegia, unspecified affecting left nondominant side: Secondary | ICD-10-CM | POA: Diagnosis not present

## 2020-04-09 LAB — BASIC METABOLIC PANEL
Anion gap: 11 (ref 5–15)
BUN: 19 mg/dL (ref 8–23)
CO2: 20 mmol/L — ABNORMAL LOW (ref 22–32)
Calcium: 8.7 mg/dL — ABNORMAL LOW (ref 8.9–10.3)
Chloride: 104 mmol/L (ref 98–111)
Creatinine, Ser: 1.19 mg/dL (ref 0.61–1.24)
GFR, Estimated: >60 mL/min (ref >60)
Glucose, Bld: 121 mg/dL — ABNORMAL HIGH (ref 70–99)
Potassium: 3.8 mmol/L (ref 3.5–5.1)
Sodium: 135 mmol/L (ref 135–145)

## 2020-04-09 LAB — CBC
HCT: 40 % (ref 39.0–52.0)
Hemoglobin: 13.3 g/dL (ref 13.0–17.0)
MCH: 22.1 pg — ABNORMAL LOW (ref 26.0–34.0)
MCHC: 33.3 g/dL (ref 30.0–36.0)
MCV: 66.4 fL — ABNORMAL LOW (ref 80.0–100.0)
Platelets: 287 10*3/uL (ref 150–400)
RBC: 6.02 MIL/uL — ABNORMAL HIGH (ref 4.22–5.81)
RDW: 15.2 % (ref 11.5–15.5)
WBC: 15.4 10*3/uL — ABNORMAL HIGH (ref 4.0–10.5)
nRBC: 0 % (ref 0.0–0.2)

## 2020-04-09 LAB — GLUCOSE, CAPILLARY
Glucose-Capillary: 132 mg/dL — ABNORMAL HIGH (ref 70–99)
Glucose-Capillary: 140 mg/dL — ABNORMAL HIGH (ref 70–99)
Glucose-Capillary: 189 mg/dL — ABNORMAL HIGH (ref 70–99)
Glucose-Capillary: 111 mg/dL — ABNORMAL HIGH (ref 70–99)

## 2020-04-09 LAB — CREATININE, FLUID (PLEURAL, PERITONEAL, JP DRAINAGE): Creat, Fluid: >50 mg/dL

## 2020-04-09 MED ORDER — HYDRALAZINE HCL 20 MG/ML IJ SOLN: 5.0000 mg | INTRAMUSCULAR | Status: DC | PRN

## 2020-04-09 MED ORDER — METOPROLOL TARTRATE 50 MG PO TABS
50.0000 mg | ORAL_TABLET | Freq: Two times a day (BID) | ORAL | Status: DC
  Administered 2020-04-10 – 2020-04-11 (×3): 50 mg via ORAL
  Filled 2020-04-09 (×3): qty 1

## 2020-04-09 MED ORDER — CLONIDINE HCL 0.1 MG PO TABS
0.1000 mg | ORAL_TABLET | Freq: Two times a day (BID) | ORAL | Status: DC
  Administered 2020-04-09 – 2020-04-11 (×5): 0.1 mg via ORAL
  Filled 2020-04-09 (×5): qty 1

## 2020-04-09 MED ORDER — METOPROLOL TARTRATE 50 MG PO TABS
50.0000 mg | ORAL_TABLET | Freq: Three times a day (TID) | ORAL | Status: DC
  Administered 2020-04-09 (×2): 50 mg via ORAL
  Filled 2020-04-09 (×2): qty 1

## 2020-04-09 MED ORDER — LABETALOL HCL 5 MG/ML IV SOLN: 5.0000 mg | INTRAVENOUS | Status: DC | PRN

## 2020-04-09 NOTE — Progress Notes (Signed)
Physical Therapy Treatment Patient Details Name: Anthony Huber MRN: [id number] DOB: 1961-01-03 Today's Date: 04/09/2020    History of Present Illness 60 y.o. M admitted 4/3 with nausea, HA, and L arm weakness. 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 Significant PMH: HTN, diabetes, CAD.    PT Comments    Pt progressing towards his physical therapy goals. Session focused on bridging, sitting balance, and transfer training. Pt demonstrating 1/5 LLE strength; noted tricep activation today with gravity eliminated. Pt displays left lateral lean in sitting; able to correct with visual cues, but not maintain. Pt requiring two person maximal assist for functional mobility and transfers bed to chair. Unable to weight shift to take steps. Continue to recommend comprehensive inpatient rehab (CIR) for post-acute therapy needs.   Follow Up Recommendations  CIR     Equipment Recommendations  3in1 (PT);Wheelchair (measurements PT);Wheelchair cushion (measurements PT)    Recommendations for Other Services       Precautions / Restrictions Precautions Precautions: Fall Precaution Comments: L hemi, neglect Restrictions Weight Bearing Restrictions: No    Mobility  Bed Mobility Overal bed mobility: Needs Assistance Bed Mobility: Rolling;Sidelying to Sit Rolling: Mod assist Sidelying to sit: Max assist;+2 for physical assistance       General bed mobility comments: Pt rolling to left with cues for reaching with RUE, assist for BLE's off edge of bed and trunk to upright.    Transfers Overall transfer level: Needs assistance Equipment used: None Transfers: Sit to/from [employer] Sit to Stand: Max assist;+2 physical assistance Stand pivot transfers: Max assist;+2 physical assistance       General transfer comment: MaxA + 2 to stand from edge of bed with left knee block, pivoting towards right from bed to chair. Unable to weight shift to take  steps  Ambulation/Gait                 Stairs             Wheelchair Mobility    Modified Rankin (Stroke Patients Only) Modified Rankin (Stroke Patients Only) Pre-Morbid Rankin Score: No symptoms Modified Rankin: Severe disability     Balance Overall balance assessment: Needs assistance Sitting-balance support: Feet supported Sitting balance-Leahy Scale: Poor Sitting balance - Comments: Left lateral lean,, min-maxA for sitting balance Postural control: Left lateral lean Standing balance support: Bilateral upper extremity supported Standing balance-Leahy Scale: Poor Standing balance comment: up to maxA in standing, left lateral lean                            Cognition Arousal/Alertness: Awake/alert Behavior During Therapy: Flat affect Overall Cognitive Status: Difficult to assess Area of Impairment: Following commands;Safety/judgement;Awareness                       Following Commands: Follows one step commands consistently Safety/Judgement: Decreased awareness of deficits Awareness: Intellectual   General Comments: Continues with decreased awareness of midline positioning and left sided deficits, however, able to turn head to left with cueing      Exercises Other Exercises Other Exercises: Supine: hip bridging with LLE stabilized x 5 Other Exercises: Sitting: lateral leans to right, use of visual cueing for midline posture    General Comments        Pertinent Vitals/Pain Pain Assessment: Faces Faces Pain Scale: No hurt    Home Living  Prior Function            PT Goals (current goals can now be found in the care plan section) Acute Rehab PT Goals Patient Stated Goal: did not state Potential to Achieve Goals: Fair Progress towards PT goals: Progressing toward goals    Frequency    Min 4X/week      PT Plan Current plan remains appropriate    Co-evaluation PT/OT/SLP  Co-Evaluation/Treatment: Yes Reason for Co-Treatment: Complexity of the patient's impairments (multi-system involvement);To address functional/ADL transfers;For patient/therapist safety PT goals addressed during session: Mobility/safety with mobility        AM-PAC PT "6 Clicks" Mobility   Outcome Measure  Help needed turning from your back to your side while in a flat bed without using bedrails?: A Lot Help needed moving from lying on your back to sitting on the side of a flat bed without using bedrails?: Total Help needed moving to and from a bed to a chair (including a wheelchair)?: Total Help needed standing up from a chair using your arms (e.g., wheelchair or bedside chair)?: Total Help needed to walk in hospital room?: Total Help needed climbing 3-5 steps with a railing? : Total 6 Click Score: 7    End of Session   Activity Tolerance: Patient tolerated treatment well Patient left: in chair;with call bell/phone within reach;with chair alarm set Nurse Communication: Mobility status PT Visit Diagnosis: Unsteadiness on feet (R26.81);Difficulty in walking, not elsewhere classified (R26.2);Hemiplegia and hemiparesis Hemiplegia - Right/Left: Left Hemiplegia - dominant/non-dominant: Non-dominant Hemiplegia - caused by: Nontraumatic intracerebral hemorrhage     Time: 3875-6433 PT Time Calculation (min) (ACUTE ONLY): 28 min  Charges:  $Therapeutic Activity: 8-22 mins                     Lillia Pauls, PT, DPT Acute Rehabilitation Services Pager [phone] Office [phone]    Norval Morton 04/09/2020, 1:13 PM

## 2020-04-09 NOTE — Evaluation (Addendum)
Speech Language Pathology Evaluation Patient Details Name: Anthony Huber MRN: [id number] DOB: 11-29-1960 Today's Date: 04/09/2020 Time: 5621-3086 SLP Time Calculation (min) (ACUTE ONLY): 17 min  Problem List:  Patient Active Problem List   Diagnosis Date Noted  . ICH (intracerebral hemorrhage) (HCC) 04/06/2020  . H/O nephrolithotomy with removal of calculi 09/16/2016  . Nephrolithiasis 09/05/2016   Past Medical History:  Past Medical History:  Diagnosis Date  . Coronary artery disease   . Diabetes mellitus without complication (HCC)   . Hypertension    Past Surgical History:  Past Surgical History:  Procedure Laterality Date  . BACK SURGERY    . BLADDER SURGERY    . CORONARY ANGIOPLASTY WITH STENT PLACEMENT    . CYSTOSCOPY WITH STENT PLACEMENT Left 09/05/2016   Procedure: CYSTOSCOPY, URETEROSCOPY;  Surgeon: Crist Fat, MD;  Location: [facility];  Service: Urology;  Laterality: Left;  . IR NEPHROSTOMY PLACEMENT LEFT  09/06/2016  . NEPHROLITHOTOMY Left 09/16/2016   Procedure: NEPHROLITHOTOMY PERCUTANEOUS  LEFT;  Surgeon: Crist Fat, MD;  Location: [facility];  Service: Urology;  Laterality: Left;   HPI:  60 y.o. M admitted 4/3 with nausea, HA, and L arm weakness. 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 Significant PMH: HTN, diabetes, CAD   Assessment / Plan / Recommendation Clinical Impression  Anthony Huber was very sleepy following PT/OT session, MD visit and participated in assessment with frequent cues to arouse with interpreter present. Of note, pt's vocal quality was wet which cleared with cues and pt/wife deny dysphagia since stroke. SLP detected minimal dysarthria and intperpreter needed repetition to hear and process responses occasionaly. Pt denies altered speech and wife states volume is lower because he's now weak. Provided accurate response to simple mental calculation. Intellectual of deficits is diminished and emergently not aware of  his significant left neglect. He was not oriented to situation. Responses to biographical information was mildly inaccurate (stated he has 2 children; per wife he had 9). ST recommended to facilitate his cognitive abilities moving toward greater independence on acute and CIR.    SLP Assessment  SLP Recommendation/Assessment: Patient needs continued Speech Lanaguage Pathology Services SLP Visit Diagnosis: Dysarthria and anarthria (R47.1);Cognitive communication deficit (R41.841)    Follow Up Recommendations  Inpatient Rehab    Frequency and Duration min 2x/week  2 weeks      SLP Evaluation Cognition  Overall Cognitive Status: Difficult to assess Arousal/Alertness: Lethargic Orientation Level: Oriented to person;Oriented to place;Oriented to time;Disoriented to situation Attention: Sustained Sustained Attention: Impaired Sustained Attention Impairment: Verbal basic Memory:  (will be assessed further) Awareness: Impaired Awareness Impairment: Intellectual impairment;Emergent impairment;Anticipatory impairment Problem Solving: Impaired (suspect for moderately complex) Safety/Judgment: Impaired       Comprehension  Auditory Comprehension Overall Auditory Comprehension: Appears within functional limits for tasks assessed Visual Recognition/Discrimination Discrimination: Not tested Reading Comprehension Reading Status: Not tested    Expression Expression Primary Mode of Expression: Verbal Verbal Expression Overall Verbal Expression: Appears within functional limits for tasks assessed Repetition:  (NT) Naming:  (no need to assess) Written Expression Dominant Hand: Right Written Expression:  (wrote his and wife's name)   Oral / Motor  Oral Motor/Sensory Function Overall Oral Motor/Sensory Function:  (suspect left- will need to assess in diagn tx) Motor Speech Overall Motor Speech: Impaired (? pt and wife say normal- detect minimal dysarthria) Phonation: Low vocal  intensity Resonance: Within functional limits Articulation: Within functional limitis Intelligibility: Intelligibility reduced Word: 75-100% accurate Phrase: 75-100% accurate Motor Planning:  Witnin functional limits   GO                    Royce Macadamia 04/09/2020, 1:41 PM Breck Coons Lonell Face.Ed Nurse, children's [phone] Office [phone]

## 2020-04-09 NOTE — Progress Notes (Signed)
Inpatient Rehabilitation Admissions Coordinator   Inpatient rehab consult received. I met with patient with his wife and interpreter, Leodis Liverpool. Patient up in recliner asleep. I discussed goals and expectations of a possible CIR admit. Wife in agreement and can provide 24/7 caregiver support at home. I will begin insurance authorization with [employer] for a possible CIR admit.  Danne Baxter, RN, MSN Rehab Admissions Coordinator [phone] 04/09/2020 1:40 PM

## 2020-04-09 NOTE — Progress Notes (Signed)
Occupational Therapy Treatment Patient Details Name: Anthony Huber MRN: [id number] DOB: Mar 14, 1960 Today's Date: 04/09/2020    History of present illness 60 y.o. M admitted 4/3 with nausea, HA, and L arm weakness. 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 Significant PMH: HTN, diabetes, CAD.   OT comments  Pt progressing towards established OT goals. Presenting with increased attention, following commands, and engagement this session. Pt tracking therapist and turning head to L visual field; eyes to midline. Pt participating in sitting balance exercises with lateral leaning to R and L; Max visual and verbal cues for correcting to midline. Pt requiring Max A +2 for stand pivot to recliner with blocking of L knee. Continue to highly recommend dc to CIR for further OT and will continue to follow acutely as admitted.   In person [place] interpreter Luberta Robertson utilized for this session   Follow Up Recommendations  CIR    Equipment Recommendations  Other (comment) (Defer to next venue)    Recommendations for Other Services PT consult;Rehab consult;Speech consult    Precautions / Restrictions Precautions Precautions: Fall Precaution Comments: L hemi, neglect Restrictions Weight Bearing Restrictions: No       Mobility Bed Mobility Overal bed mobility: Needs Assistance Bed Mobility: Rolling;Sidelying to Sit Rolling: Mod assist Sidelying to sit: Max assist;+2 for physical assistance       General bed mobility comments: Pt rolling to left with cues for reaching with RUE, assist for BLE's off edge of bed and trunk to upright.    Transfers Overall transfer level: Needs assistance Equipment used: None Transfers: Sit to/from [employer] Sit to Stand: Max assist;+2 physical assistance Stand pivot transfers: Max assist;+2 physical assistance       General transfer comment: MaxA + 2 to stand from edge of bed with left knee block, pivoting  towards right from bed to chair. Unable to weight shift to take steps    Balance Overall balance assessment: Needs assistance Sitting-balance support: Feet supported Sitting balance-Leahy Scale: Poor Sitting balance - Comments: Left lateral lean,, min-maxA for sitting balance Postural control: Left lateral lean Standing balance support: Bilateral upper extremity supported Standing balance-Leahy Scale: Poor Standing balance comment: up to maxA in standing, left lateral lean. achieving MIn Guard A for a couple seconds but then falling to L                           ADL either performed or assessed with clinical judgement   ADL Overall ADL's : Needs assistance/impaired     Grooming: Wash/dry face;Maximal assistance;Bed level                   Toilet Transfer: Maximal assistance;+2 for safety/equipment;+2 for physical assistance;Stand-pivot (recliner)           Functional mobility during ADLs: Maximal assistance;+2 for physical assistance;+2 for safety/equipment (stand pivot) General ADL Comments: Pt presenting with decreased following of cues, attending to therapists, sitting balance, and activity tolerance     Vision   Additional Comments: Continues to present with R gaze and head turn prefernece. Able to track and turn head to L; eyes reaching midline. Pt making eye contact   Perception     Praxis      Cognition Arousal/Alertness: Awake/alert Behavior During Therapy: Flat affect Overall Cognitive Status: Difficult to assess Area of Impairment: Following commands;Safety/judgement;Awareness  Following Commands: Follows one step commands consistently Safety/Judgement: Decreased awareness of deficits Awareness: Intellectual   General Comments: Continues with decreased awareness of midline positioning and left sided deficits, however, able to turn head to left with cueing. Following more cues this session.         Exercises Exercises: Other exercises Other Exercises Other Exercises: Supine: hip bridging with LLE stabilized x 5 Other Exercises: Sitting: lateral leans to right, use of visual cueing for midline posture   Shoulder Instructions       General Comments      Pertinent Vitals/ Pain       Pain Assessment: Faces Faces Pain Scale: No hurt Pain Intervention(s): Monitored during session  Home Living                                          Prior Functioning/Environment              Frequency  Min 2X/week        Progress Toward Goals  OT Goals(current goals can now be found in the care plan section)  Progress towards OT goals: Progressing toward goals  Acute Rehab OT Goals Patient Stated Goal: did not state OT Goal Formulation: With patient/family Time For Goal Achievement: 04/22/20 Potential to Achieve Goals: Good ADL Goals Pt Will Perform Grooming: with min assist;sitting Pt Will Perform Upper Body Bathing: with min assist;sitting Pt Will Transfer to Toilet: with mod assist;stand pivot transfer;bedside commode Additional ADL Goal #1: Pt will attend to 3/5 ADL tools in L visual field with Mod cues Additional ADL Goal #2: Pt will tolerate sitting at EOB with Min A in preparation for ADLs Additional ADL Goal #3: Pt will follow one step commands during ADLs with Mod cues  Plan Discharge plan remains appropriate    Co-evaluation    PT/OT/SLP Co-Evaluation/Treatment: Yes Reason for Co-Treatment: To address functional/ADL transfers;For patient/therapist safety PT goals addressed during session: Mobility/safety with mobility OT goals addressed during session: ADL's and self-care      AM-PAC OT "6 Clicks" Daily Activity     Outcome Measure   Help from another person eating meals?: Total Help from another person taking care of personal grooming?: A Lot Help from another person toileting, which includes using toliet, bedpan, or urinal?: A Lot Help  from another person bathing (including washing, rinsing, drying)?: Total Help from another person to put on and taking off regular upper body clothing?: A Lot Help from another person to put on and taking off regular lower body clothing?: Total 6 Click Score: 9    End of Session Equipment Utilized During Treatment: Gait belt  OT Visit Diagnosis: Unsteadiness on feet (R26.81);Other abnormalities of gait and mobility (R26.89);Muscle weakness (generalized) (M62.81);Hemiplegia and hemiparesis Hemiplegia - Right/Left: Left Hemiplegia - dominant/non-dominant: Non-Dominant Hemiplegia - caused by: Cerebral infarction   Activity Tolerance Patient tolerated treatment well   Patient Left in chair;with call bell/phone within reach;with chair alarm set;with family/visitor present   Nurse Communication Mobility status        Time: 6063-0160 OT Time Calculation (min): 26 min  Charges: OT General Charges $OT Visit: 1 Visit OT Treatments $Self Care/Home Management : 8-22 mins  Maher Shon MSOT, OTR/L Acute Rehab Pager: [phone] Office: [phone]    Theodoro Grist Sruti Ayllon 04/09/2020, 3:30 PM

## 2020-04-09 NOTE — Progress Notes (Signed)
Please send fluid from purewick to lab for creatinine to confirm that what is draining from incision is urine.  I will order this.  Also, recommend placing foley and paying attention to output from purewick once foley is placed.  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  Would probably need catheter for 2-4 weeks.  Will continue to follow.

## 2020-04-09 NOTE — Progress Notes (Signed)
STROKE TEAM PROGRESS NOTE   INTERVAL HISTORY Wife is at the bedside with interpretor. Pt lethargic but eyes open on voice, denies HA or N/V. Still has left hemiplegia and left facial droop but orientated. Wife denies any dysarthria. Pt continued to have right elbow pain on extension or flexion. Will do X-ray.    Vitals:   04/09/20 0754 04/09/20 0800 04/09/20 0932 04/09/20 1000  BP: (!) 169/96  (!) 141/88 (!) 141/88  Pulse:   98 (!) 104  Resp:    (!) 26  Temp:  98.5 F (36.9 C)    TempSrc:  Oral    SpO2:    98%  Weight:      Height:       CBC:  Recent Labs  Lab 04/06/20 2350 04/07/20 0056 04/08/20 0707 04/09/20 0211  WBC 15.7*  --  14.7* 15.4*  NEUTROABS 8.8*  --   --   --   HGB 12.6*   < > 13.6 13.3  HCT 40.0   < > 41.5 40.0  MCV 71.0*  --  69.1* 66.4*  PLT 301  --  307 287   < > = values in this interval not displayed.   Basic Metabolic Panel:  Recent Labs  Lab 04/08/20 0707 04/09/20 0211  NA 139 135  K 3.9 3.8  CL 104 104  CO2 23 20*  GLUCOSE 138* 121*  BUN 21 19  CREATININE 1.25* 1.19  CALCIUM 8.7* 8.7*   Lipid Panel:  Recent Labs  Lab 04/07/20 1644 04/08/20 0707  CHOL 173  --   TRIG 238* 238*  HDL 36*  --   CHOLHDL 4.8  --   VLDL 48*  --   LDLCALC 89  --    HgbA1c:  Recent Labs  Lab 04/06/20 2350  HGBA1C 5.8*   Urine Drug Screen: No results for input(s): LABOPIA, COCAINSCRNUR, LABBENZ, AMPHETMU, THCU, LABBARB in the last 168 hours.  Alcohol Level No results for input(s): ETH in the last 168 hours.  IMAGING past 24 hours  MRI  Result Date: 04/08/2020 1. Stable size and morphology of right basal ganglia intraparenchymal hemorrhage with similar surrounding vasogenic edema and regional mass effect. Associated 5 mm right-to-left shift, stable. No intraventricular extension, hydrocephalus, or ventricular trapping. No appreciable underlying lesion on this noncontrast examination. 2. Underlying mild chronic microvascular ischemic disease.  MRA  Head Result Date: 04/08/2020 1. Negative intracranial MRA. No intracranial aneurysm or other vascular abnormality seen underlying the right basal ganglia hemorrhage. No large vessel occlusion. 2. 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 No other hemodynamically significant stenosis. 3. 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 Fetal type origin of the right PCA.  Echo 04/08/2020 1. Left ventricular ejection fraction, by estimation, is 65 to 70%. The  left ventricle has normal function. The left ventricle has no regional  wall motion abnormalities. Left ventricular diastolic parameters were  normal.  2. Right ventricular systolic function is normal. The right ventricular  size is normal. There is mildly elevated pulmonary artery systolic  pressure.  3. The mitral valve is normal in structure. Trivial mitral valve  regurgitation. No evidence of mitral stenosis.  4. The aortic valve is normal in structure. Aortic valve regurgitation is  not visualized. No aortic stenosis is present.  5. The inferior vena cava is normal in size with greater than 50%  respiratory variability, suggesting right atrial pressure of 3 mmHg.   CT HEAD WO CONTRAST Result Date: 04/07/2020  IMPRESSION:  1.  Acute right basal ganglia hemorrhage not significantly changed since last night, estimated blood volume 22 mL. No intraventricular or extra-axial extension. Stable surrounding edema and mild regional mass effect.  2. No new intracranial abnormality.  CT HEAD CODE STROKE WO CONTRAST Result Date: 04/06/2020 IMPRESSION: 1. 4.3 x 2.4 x 3.4 cm acute intraparenchymal hemorrhage centered at the right basal ganglia, estimated volume 18 cc. Localized mass effect with up to 5 mm of right-to-left shift.  2. Underlying moderate chronic microvascular ischemic disease.    PHYSICAL EXAM  Temp:  [97.6 F (36.4 C)-99.2 F (37.3 C)] 98.5 F (36.9 C) (04/05 0800) Pulse Rate:  [79-115] 104  (04/05 1000) Resp:  [15-40] 26 (04/05 1000) BP: (123-179)/(69-114) 141/88 (04/05 1000) SpO2:  [95 %-100 %] 98 % (04/05 1000)  General - Well nourished, well developed, in no apparent distress but lethargic.  Ophthalmologic - fundi not visualized due to noncooperation.  Cardiovascular - Regular rhythm and rate.  Musculoskeletal - right eblow pain on extension or flexion, but no significant tender on touch  Neuro - lethargic but eyes open on voice, awake, alert, orientated to age, place, and people. No aphasia but paucity of speech, only answers questions with brief answers, following all simple commands. Able to repeat simple sentence, wife denies dysarthria. Right gaze preference, barely cross midline, tracking on the right, not blinking to visual threat on the left, PERRL. Right facial droop. Tongue midline. LUE flaccid, LLE 1/5, RUE and RLE spontaneous movement and against gravity, RUE at least 4/5 and RLE at least 3/5. Sensation symmetrical bilaterally subjectively, FTN not able to do due to right UE pain at elbow and left UE flaccid, gait not tested.    ASSESSMENT/PLAN Mr. Corrion Stirewalt is a 60 y.o. male with history of hypertension, DM, CAD presenting with subcortical hematoma likely due to hypertension.   ICH - acute right BG hemorrhage - likely due to uncontrolled HTN  CT head: 4.3 x 2.4 x 3.4 cm acute intraparenchymal hemorrhage centered at the right basal ganglia, estimated volume 18 cc. Localized mass effect with up to 5 mm of right-to-left shift.  MRI: No underlying lesion. Stable right BG hemorrhage with vasogenic edema and right to left shift stable    MRA: No aneurysm,no vascular abnormality, moderate stenosis of mid basilar artery. 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   Therapy  recommendations:  OT/PT CIR  Disposition:  pending  Hypertensive emergency  Home meds:  None  Off cleviprex infusion now  Add amlodipine 10mg  daily, metoprolol 50mg  bid and clonidine 0.1 bid . SBP goal <160 in the setting of hemorrhage . Long-term BP goal normotensive  Hyperlipidemia  Home meds: none  LDL 89, goal < 70  Consider statin at discharge  Diabetes type II Controlled  Home meds:  none  HgbA1c 5.8, goal < 7.0  CBGs  SSI  Close PCP follow-up  Urinary incontinence   Hx of multiple kidney infections, kidney stones, multiple surgeries.   S/p left NEPHROLITHOTOMY PERCUTANEOUS and Laser endoureterotomy 09/2016 with Dr. at [facility]  Currently has urine draining from lower abdominal incision.   CT severe left renal atrophy, moderate left hydroureter and severe left hydronephrosis  Urology consulted for further evaluation and management.  AKI  Creatinine 1.1->0.9->1.25->1.19  Continue IV fluid  Encourage p.o. intake  BMP  monitoring  Right elbow pain  X-ray advanced elbow joint degenerative changes, no dislocation or fracture  Pain with elbow extension or flexion  PT/OT  Other Stroke Risk Factors  Former Cigarette smoker quit 6 years ago  Coronary artery disease: Coronary angioplasty with stent placement  Other active issue  Leukocytosis WBC 15.7>14.7->15.4  Hospital day # 3  This patient is critically ill due to hypertensive emergency, right BG ICH hydronephrosis, AKI and leukocytosis and at significant risk of neurological worsening, death form cerebral edema, encephalopathy, renal failure, sepsis. This patient's care requires constant monitoring of vital signs, hemodynamics, respiratory and cardiac monitoring, review of multiple databases, neurological assessment, discussion with family, other specialists and medical decision making of high complexity. I spent 35 minutes of neurocritical care time in the care of this patient.   Marvel Plan, MD PhD Stroke Neurology 04/09/2020 11:23 AM   To contact Stroke Continuity provider, please refer to [email or web address]. After hours, contact General Neurology

## 2020-04-09 NOTE — Consult Note (Signed)
Physical Medicine and Rehabilitation Consult Reason for Consult: Left side weakness Referring Physician: Dr.Xu   HPI: Anthony Huber is a 60 y.o. right-handed male with history of open ureterlithotomy 2005 in [place] as well as left PCNL and laser ureterotomy October 2018 by Dr. Marlou Porch, CAD, diabetes mellitus and hypertension with remote tobacco abuse.  Per chart review patient lives with spouse independent prior to admission.  1 level home.  He works at a [employer], [employer].  Presented 04/06/2020 with altered mental status, headache and left-sided weakness.  Blood pressure 170/100.  Cranial CT scan showed a 4.3 x 2.4 x 3.4 cm acute intraparenchymal hemorrhage centered at the right basal ganglia.  Localized mass-effect with up to 5 mm right to left shift.  Admission chemistries unremarkable, hemoglobin A1c 5.8, WBC 15,700.  MRI/MRA shows stable size and morphology of right basal ganglia intraparenchymal hemorrhage with similar surrounding vasogenic edema and regional mass-effect.  Associated 5 mm right to left shift.  No intraventricular extension hydrocephalus or ventricular trapping.  MRA was unremarkable.  Echocardiogram with ejection fraction of 65 to 70% no wall motion abnormalities.  Maintained on Cleviprex for blood pressure control.  Patient was cleared to begin subcutaneous heparin for DVT prophylaxis for 04/06/2020.  Hospital course follow-up urology services 04/08/2020 for evaluation of leaking urine from his incision line which had been intermittent since ureterotomy.  CT abdomen and pelvis showed several tiny stones in the distal left ureter.  Moderate left hydroureter and severe left hydronephrosis.  Severe left renal parenchymal atrophy and cortical thinning secondary to chronic hydronephrosis.  Linear band through the proximal left ureter with focal luminal narrowing likely representing adhesion or scarring.  2 punctate nonobstructing left renal inferior pole calculi and await follow-up plan by  urology services.  Therapy evaluations completed due to patient's weakness recommendations of physical medicine rehab consult.   Review of Systems  Constitutional: Negative for chills and fever.  HENT: Negative for hearing loss.   Eyes: Negative for blurred vision and double vision.  Respiratory: Negative for cough and shortness of breath.   Cardiovascular: Negative for chest pain, palpitations and leg swelling.  Gastrointestinal: Positive for constipation. Negative for heartburn, nausea and vomiting.  Genitourinary: Positive for urgency. Negative for dysuria, flank pain and hematuria.  Musculoskeletal: Positive for back pain.  Skin: Negative for rash.  Neurological: Positive for weakness.  All other systems reviewed and are negative.  Past Medical History:  Diagnosis Date  . Coronary artery disease   . Diabetes mellitus without complication (HCC)   . Hypertension    Past Surgical History:  Procedure Laterality Date  . BACK SURGERY    . BLADDER SURGERY    . CORONARY ANGIOPLASTY WITH STENT PLACEMENT    . CYSTOSCOPY WITH STENT PLACEMENT Left 09/05/2016   Procedure: CYSTOSCOPY, URETEROSCOPY;  Surgeon: Crist Fat, MD;  Location: [facility];  Service: Urology;  Laterality: Left;  . IR NEPHROSTOMY PLACEMENT LEFT  09/06/2016  . NEPHROLITHOTOMY Left 09/16/2016   Procedure: NEPHROLITHOTOMY PERCUTANEOUS  LEFT;  Surgeon: Crist Fat, MD;  Location: [facility];  Service: Urology;  Laterality: Left;   No family history on file. Social History:  reports that he quit smoking about 6 years ago. He has never used smokeless tobacco. He reports that he does not drink alcohol and does not use drugs. Allergies: No Known Allergies Medications Prior to Admission  Medication Sig Dispense Refill  . ibuprofen (ADVIL) 200 MG tablet Take 200 mg by mouth every 6 (six)  hours as needed for headache or moderate pain.    . Pseudoeph-Doxylamine-DM-APAP (NYQUIL PO) Take 1 Dose by mouth daily as needed (sore  throat/congestion).    . Pseudoephedrine-APAP-DM (DAYQUIL PO) Take 1 Dose by mouth daily as needed (sore throat/congestion).    . naproxen (NAPROSYN) 500 MG tablet Take 500 mg by mouth 2 (two) times daily as needed for pain. (Patient not taking: No sig reported)  0  . omeprazole (PRILOSEC) 20 MG capsule Take 20 mg by mouth 2 (two) times daily. (Patient not taking: No sig reported)  0  . traMADol (ULTRAM) 50 MG tablet Take 1-2 tablets (50-100 mg total) by mouth every 6 (six) hours as needed for moderate pain. 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 Functional Status:  Mobility: Bed Mobility Overal bed mobility: Needs Assistance Bed Mobility: Supine to Sit Supine to sit: Max assist,+2 for physical assistance General bed mobility comments: Assist for trunk to upright Transfers Overall transfer level: Needs assistance Equipment used: None Transfers: Sit to/from [employer] Sit to Stand: Max assist,+2 physical assistance Squat pivot transfers: Max assist,+2 physical assistance General transfer comment: MaxA + 2 for low pivot transfer to right. Pt then able to perform x 2 stands from chair, one with face to face transfer and one with +2. Left knee block provided. pt able to initiate, but demonstrates left lateral lean. Tactile cues provided for midline positioning and upright posture. Mild-mod left knee buckle      ADL: ADL Overall ADL's : Needs assistance/impaired Eating/Feeding:  NPO Grooming: Maximal assistance,Sitting Upper Body Bathing: Maximal assistance,Sitting Lower Body Bathing: Total assistance,Sit to/from stand,Bed level Upper Body Dressing : Maximal assistance,Sitting Lower Body Dressing: Total assistance,Sit to/from stand,Bed level Toilet Transfer: Maximal assistance,+2 for physical assistance,+2 for safety/equipment,Squat-pivot (simulated to recliner) Functional mobility during ADLs: Maximal assistance,+2 for physical assistance,+2 for safety/equipment General ADL Comments: Pt requiring Max-Total A for ADLs due to decreased balance, cognition, and strength  Cognition: Cognition Overall Cognitive Status: Difficult to assess Orientation Level: Oriented to person,Oriented to place,Oriented to time,Disoriented to situation Cognition Arousal/Alertness: Awake/alert Behavior During Therapy: Flat affect Overall Cognitive Status: Difficult to assess Area of Impairment: Following commands,Safety/judgement,Awareness,Orientation Orientation Level: Situation Following Commands: Follows one step commands consistently Safety/Judgement: Decreased awareness of deficits Awareness: Intellectual General Comments: Pt oriented to time and place; not oriented to situation, stating he was "sick." does not seem to have awareness of left sided deficits and demonstrates neglect Difficult to assess due to: Non-English speaking  Blood pressure (!) 173/91, pulse 88, temperature 97.6 F (36.4 C), temperature source Oral, resp. rate (!) 31, height  (1.499 m), weight 51.9 kg, SpO2 98 %. Physical Exam Constitutional:      Comments: lethargic  HENT:     Head: Normocephalic.     Right Ear: External ear normal.     Left Ear: External ear normal.     Nose: Nose normal.  Cardiovascular:     Rate and Rhythm: Normal rate.     Pulses: Normal pulses.  Pulmonary:     Effort: Pulmonary effort is normal.  Abdominal:  Palpations: Abdomen is soft.  Musculoskeletal:      Cervical back: Normal range of motion.  Skin:    General: Skin is warm.  Neurological:     Comments: Asleep. Awakens to verbal stim but not for long. Left facial weakness.  RUE and RLE move freely. LUE and LLE 0/5. Did not withdraw to pain provocation on left.   Psychiatric:     Comments: Pt lethargic     Results for orders placed or performed during the hospital encounter of 04/06/20 (from the past 24 hour(s))  CBC     Status: Abnormal   Collection Time: 04/08/20  7:07 AM  Result Value Ref Range   WBC 14.7 (H) 4.0 - 10.5 K/uL   RBC 6.01 (H) 4.22 - 5.81 MIL/uL   Hemoglobin 13.6 13.0 - 17.0 g/dL   HCT 61.4 43.1 - 54.0 %   MCV 69.1 (L) 80.0 - 100.0 fL   MCH 22.6 (L) 26.0 - 34.0 pg   MCHC 32.8 30.0 - 36.0 g/dL   RDW 08.6 (H) 76.1 - 95.0 %   Platelets 307 150 - 400 K/uL   nRBC 0.0 0.0 - 0.2 %  Basic metabolic panel     Status: Abnormal   Collection Time: 04/08/20  7:07 AM  Result Value Ref Range   Sodium 139 135 - 145 mmol/L   Potassium 3.9 3.5 - 5.1 mmol/L   Chloride 104 98 - 111 mmol/L   CO2 23 22 - 32 mmol/L   Glucose, Bld 138 (H) 70 - 99 mg/dL   BUN 21 8 - 23 mg/dL   Creatinine, Ser 9.32 (H) 0.61 - 1.24 mg/dL   Calcium 8.7 (L) 8.9 - 10.3 mg/dL   GFR, Estimated >67 >12 mL/min   Anion gap 12 5 - 15  Triglycerides     Status: Abnormal   Collection Time: 04/08/20  7:07 AM  Result Value Ref Range   Triglycerides 238 (H) <150 mg/dL  Urinalysis, Routine w reflex microscopic Urine, Clean Catch     Status: Abnormal   Collection Time: 04/08/20  2:38 PM  Result Value Ref Range   Color, Urine YELLOW YELLOW   APPearance CLEAR CLEAR   Specific Gravity, Urine 1.015 1.005 - 1.030   pH 5.0 5.0 - 8.0   Glucose, UA NEGATIVE NEGATIVE mg/dL   Hgb urine dipstick SMALL (A) NEGATIVE   Bilirubin Urine NEGATIVE NEGATIVE   Ketones, ur NEGATIVE NEGATIVE mg/dL   Protein, ur 30 (A) NEGATIVE mg/dL   Nitrite NEGATIVE NEGATIVE   Leukocytes,Ua TRACE (A) NEGATIVE   RBC / HPF 6-10 0 - 5 RBC/hpf    WBC, UA 6-10 0 - 5 WBC/hpf   Bacteria, UA MANY (A) NONE SEEN   Mucus PRESENT   Glucose, capillary     Status: Abnormal   Collection Time: 04/08/20  3:36 PM  Result Value Ref Range   Glucose-Capillary 130 (H) 70 - 99 mg/dL  Glucose, capillary     Status: Abnormal   Collection Time: 04/08/20  9:34 PM  Result Value Ref Range   Glucose-Capillary 121 (H) 70 - 99 mg/dL  CBC     Status: Abnormal   Collection Time: 04/09/20  2:11 AM  Result Value Ref Range   WBC 15.4 (H) 4.0 - 10.5 K/uL   RBC 6.02 (H) 4.22 - 5.81 MIL/uL   Hemoglobin 13.3 13.0 - 17.0 g/dL   HCT 45.8 09.9 - 83.3 %   MCV 66.4 (L) 80.0 - 100.0 fL   MCH 22.1 (  L) 26.0 - 34.0 pg   MCHC 33.3 30.0 - 36.0 g/dL   RDW 16.115.2 09.611.5 - 04.515.5 %   Platelets 287 150 - 400 K/uL   nRBC 0.0 0.0 - 0.2 %  Basic metabolic panel     Status: Abnormal   Collection Time: 04/09/20  2:11 AM  Result Value Ref Range   Sodium 135 135 - 145 mmol/L   Potassium 3.8 3.5 - 5.1 mmol/L   Chloride 104 98 - 111 mmol/L   CO2 20 (L) 22 - 32 mmol/L   Glucose, Bld 121 (H) 70 - 99 mg/dL   BUN 19 8 - 23 mg/dL   Creatinine, Ser 4.091.19 0.61 - 1.24 mg/dL   Calcium 8.7 (L) 8.9 - 10.3 mg/dL   GFR, Estimated >81>60 >19>60 mL/min   Anion gap 11 5 - 15   CT HEAD WO CONTRAST  Result Date: 04/07/2020 CLINICAL DATA:  60 year old male code stroke presentation yesterday with acute right basal ganglia hemorrhage. Subsequent encounter. EXAM: CT HEAD WITHOUT CONTRAST TECHNIQUE: Contiguous axial images were obtained from the base of the skull through the vertex without intravenous contrast. COMPARISON:  Head CT 2312 hours yesterday. FINDINGS: Brain: Oval hyperdense hemorrhage centered at the posterior right lentiform encompasses 54 x 21 x 39 mm (AP by transverse by CC) for an estimated intra-axial blood volume of 22 mL but not significantly changed when measured in the same way last night (52 by 24 by 36 mm last night). Surrounding edema has not significantly changed. No extra-axial or  intraventricular extension has developed. There is mass effect on the right lateral ventricle with mild leftward midline shift of 2-3 mm, stable. Basilar cisterns remain normal. Stable gray-white matter differentiation elsewhere. Vascular: No suspicious intracranial vascular hyperdensity. Skull: Poor left maxillary posterior dentition. No acute osseous abnormality identified. Sinuses/Orbits: Visualized paranasal sinuses and mastoids are stable and well pneumatized. Other: Visualized orbits and scalp soft tissues are within normal limits. IMPRESSION: 1. Acute right basal ganglia hemorrhage not significantly changed since last night, estimated blood volume 22 mL. No intraventricular or extra-axial extension. Stable surrounding edema and mild regional mass effect. 2. No new intracranial abnormality. Electronically Signed   By: Odessa FlemingH  Hall M.D.   On: 04/07/2020 11:31   MR ANGIO HEAD WO CONTRAST  Result Date: 04/08/2020 CLINICAL DATA:  Follow-up examination for acute intracranial hemorrhage. EXAM: MRI HEAD WITHOUT CONTRAST MRA HEAD WITHOUT CONTRAST TECHNIQUE: Multiplanar, multiecho pulse sequences of the brain and surrounding structures were obtained without intravenous contrast. Angiographic images of the head were obtained using MRA technique without contrast. COMPARISON:  Prior CTs from 04/07/2020 and 04/06/2020 FINDINGS: MRI HEAD FINDINGS Brain: Examination mildly degraded by motion artifact. Cerebral volume within normal limits for age. Patchy T2/FLAIR hyperintensity within the periventricular and deep white matter both cerebral hemispheres most consistent with chronic small vessel ischemic disease. Previously identified intraparenchymal hemorrhage centered at the right basal ganglia again seen, not significantly changed in size and morphology measuring 4.9 x 1.9 x 3.5 cm (estimated volume 18 mL). Similar surrounding vasogenic edema with regional mass effect. Partial effacement of the right lateral ventricle,  similar. Associated right-to-left shift of up to approximately 5 mm also relatively unchanged. No intraventricular extension. Basilar cisterns remain patent. No appreciable underlying lesion on this noncontrast examination. No other evidence for acute or chronic intracranial hemorrhage. No other diffusion abnormality to suggest acute or subacute ischemia. Gray-white matter differentiation otherwise maintained. No encephalomalacia to suggest chronic cortical infarction elsewhere within the brain. No mass lesion or  extra-axial fluid collection. No hydrocephalus or ventricular trapping. No made of a partially empty sella. Midline structures intact. Vascular: Major intracranial vascular flow voids are maintained. Skull and upper cervical spine: Craniocervical junction within normal limits. Bone marrow signal intensity normal. No scalp soft tissue abnormality. Sinuses/Orbits: Globes and orbital soft tissues within normal limits. Mild scattered mucosal thickening noted within the ethmoidal air cells. Paranasal sinuses are otherwise largely clear. No significant mastoid effusion. Inner ear structures grossly normal. Other: None. MRA HEAD FINDINGS ANTERIOR CIRCULATION: Visualized distal cervical segments of the internal carotid arteries are mildly tortuous but patent with antegrade flow. Petrous, cavernous, and supraclinoid segments widely patent without stenosis or other abnormality. A1 segments widely patent. Normal anterior communicating artery complex. Anterior cerebral arteries widely patent to their distal aspects without stenosis. No M1 stenosis or occlusion. Normal MCA bifurcations. Distal MCA branches well perfused and symmetric. POSTERIOR CIRCULATION: Dominant right V4 segment widely patent to the vertebrobasilar junction. Left vertebral artery hypoplastic and terminates in PICA. Both PICA origins patent and normal. Focal moderate stenosis noted involving the mid basilar artery (series 1154, image 8). Basilar  otherwise patent to its distal aspect. Superior cerebellar arteries patent bilaterally. Left PCA supplied via the basilar as well as a prominent left posterior communicating artery. Fetal type origin of the right PCA. Both PCAs well perfused to their distal aspects without stenosis. No intracranial aneurysm. No other vascular abnormality seen underlying the acute right basal ganglia hemorrhage. IMPRESSION: MRI HEAD IMPRESSION: 1. Stable size and morphology of right basal ganglia intraparenchymal hemorrhage with similar surrounding vasogenic edema and regional mass effect. Associated 5 mm right-to-left shift, stable. No intraventricular extension, hydrocephalus, or ventricular trapping. No appreciable underlying lesion on this noncontrast examination. 2. Underlying mild chronic microvascular ischemic disease. MRA HEAD IMPRESSION: 1. Negative intracranial MRA. No intracranial aneurysm or other vascular abnormality seen underlying the right basal ganglia hemorrhage. No large vessel occlusion. 2. 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 No other hemodynamically significant stenosis. 3. 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 Fetal type origin of the right PCA. Electronically Signed   By: Rise Mu M.D.   On: 04/08/2020 03:19   MR BRAIN WO CONTRAST  Result Date: 04/08/2020 CLINICAL DATA:  Follow-up examination for acute intracranial hemorrhage. EXAM: MRI HEAD WITHOUT CONTRAST MRA HEAD WITHOUT CONTRAST TECHNIQUE: Multiplanar, multiecho pulse sequences of the brain and surrounding structures were obtained without intravenous contrast. Angiographic images of the head were obtained using MRA technique without contrast. COMPARISON:  Prior CTs from 04/07/2020 and 04/06/2020 FINDINGS: MRI HEAD FINDINGS Brain: Examination mildly degraded by motion artifact. Cerebral volume within normal limits for age. Patchy T2/FLAIR hyperintensity within the periventricular and deep white matter  both cerebral hemispheres most consistent with chronic small vessel ischemic disease. Previously identified intraparenchymal hemorrhage centered at the right basal ganglia again seen, not significantly changed in size and morphology measuring 4.9 x 1.9 x 3.5 cm (estimated volume 18 mL). Similar surrounding vasogenic edema with regional mass effect. Partial effacement of the right lateral ventricle, similar. Associated right-to-left shift of up to approximately 5 mm also relatively unchanged. No intraventricular extension. Basilar cisterns remain patent. No appreciable underlying lesion on this noncontrast examination. No other evidence for acute or chronic intracranial hemorrhage. No other diffusion abnormality to suggest acute or subacute ischemia. Gray-white matter differentiation otherwise maintained. No encephalomalacia to suggest chronic cortical infarction elsewhere within the brain. No mass lesion or extra-axial fluid collection. No hydrocephalus or ventricular trapping. No made of a partially empty  sella. Midline structures intact. Vascular: Major intracranial vascular flow voids are maintained. Skull and upper cervical spine: Craniocervical junction within normal limits. Bone marrow signal intensity normal. No scalp soft tissue abnormality. Sinuses/Orbits: Globes and orbital soft tissues within normal limits. Mild scattered mucosal thickening noted within the ethmoidal air cells. Paranasal sinuses are otherwise largely clear. No significant mastoid effusion. Inner ear structures grossly normal. Other: None. MRA HEAD FINDINGS ANTERIOR CIRCULATION: Visualized distal cervical segments of the internal carotid arteries are mildly tortuous but patent with antegrade flow. Petrous, cavernous, and supraclinoid segments widely patent without stenosis or other abnormality. A1 segments widely patent. Normal anterior communicating artery complex. Anterior cerebral arteries widely patent to their distal aspects without  stenosis. No M1 stenosis or occlusion. Normal MCA bifurcations. Distal MCA branches well perfused and symmetric. POSTERIOR CIRCULATION: Dominant right V4 segment widely patent to the vertebrobasilar junction. Left vertebral artery hypoplastic and terminates in PICA. Both PICA origins patent and normal. Focal moderate stenosis noted involving the mid basilar artery (series 1154, image 8). Basilar otherwise patent to its distal aspect. Superior cerebellar arteries patent bilaterally. Left PCA supplied via the basilar as well as a prominent left posterior communicating artery. Fetal type origin of the right PCA. Both PCAs well perfused to their distal aspects without stenosis. No intracranial aneurysm. No other vascular abnormality seen underlying the acute right basal ganglia hemorrhage. IMPRESSION: MRI HEAD IMPRESSION: 1. Stable size and morphology of right basal ganglia intraparenchymal hemorrhage with similar surrounding vasogenic edema and regional mass effect. Associated 5 mm right-to-left shift, stable. No intraventricular extension, hydrocephalus, or ventricular trapping. No appreciable underlying lesion on this noncontrast examination. 2. Underlying mild chronic microvascular ischemic disease. MRA HEAD IMPRESSION: 1. Negative intracranial MRA. No intracranial aneurysm or other vascular abnormality seen underlying the right basal ganglia hemorrhage. No large vessel occlusion. 2. 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 No other hemodynamically significant stenosis. 3. 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 Fetal type origin of the right PCA. Electronically Signed   By: Rise Mu M.D.   On: 04/08/2020 03:19   DG CHEST PORT 1 VIEW  Result Date: 04/08/2020 CLINICAL DATA:  Stroke EXAM: PORTABLE CHEST 1 VIEW COMPARISON:  None. FINDINGS: The heart size and mediastinal contours are within normal limits. Prominence of the central pulmonary vasculature is seen. The  visualized skeletal structures are unremarkable. IMPRESSION: Mild pulmonary vascular congestion.  For Electronically Signed   By: Jonna Clark M.D.   On: 04/08/2020 15:03   ECHOCARDIOGRAM COMPLETE  Result Date: 04/08/2020    ECHOCARDIOGRAM REPORT   Patient Name:   [facility]  Date of Exam: 04/08/2020 Medical Rec #:  [id number]  Height:       59.0 in Accession #:    [id number] Weight:       114.5 lb Date of Birth:  November 09, 1960  BSA:          1.455 m Patient Age:    60 years   BP:           126/94 mmHg Patient Gender: M          HR:           117 bpm. Exam Location:  Inpatient Procedure: 2D Echo, Cardiac Doppler and Color Doppler Indications:    Stroke I63.9  History:        Patient has no prior history of Echocardiogram examinations.                 CAD; Risk  Factors:Hypertension and Diabetes.  Sonographer:    Eulah Pont RDCS Referring Phys: [phone] MCNEILL P KIRKPATRICK IMPRESSIONS  1. Left ventricular ejection fraction, by estimation, is 65 to 70%. The left ventricle has normal function. The left ventricle has no regional wall motion abnormalities. Left ventricular diastolic parameters were normal.  2. Right ventricular systolic function is normal. The right ventricular size is normal. There is mildly elevated pulmonary artery systolic pressure.  3. The mitral valve is normal in structure. Trivial mitral valve regurgitation. No evidence of mitral stenosis.  4. The aortic valve is normal in structure. Aortic valve regurgitation is not visualized. No aortic stenosis is present.  5. The inferior vena cava is normal in size with greater than 50% respiratory variability, suggesting right atrial pressure of 3 mmHg. FINDINGS  Left Ventricle: Left ventricular ejection fraction, by estimation, is 65 to 70%. The left ventricle has normal function. The left ventricle has no regional wall motion abnormalities. The left ventricular internal cavity size was normal in size. There is  no left ventricular hypertrophy. Left  ventricular diastolic parameters were normal. Right Ventricle: The right ventricular size is normal. No increase in right ventricular wall thickness. Right ventricular systolic function is normal. There is mildly elevated pulmonary artery systolic pressure. The tricuspid regurgitant velocity is 2.93  m/s, and with an assumed right atrial pressure of 3 mmHg, the estimated right ventricular systolic pressure is 37.3 mmHg. Left Atrium: Left atrial size was normal in size. Right Atrium: Right atrial size was normal in size. Pericardium: There is no evidence of pericardial effusion. Mitral Valve: The mitral valve is normal in structure. Trivial mitral valve regurgitation. No evidence of mitral valve stenosis. Tricuspid Valve: The tricuspid valve is normal in structure. Tricuspid valve regurgitation is trivial. No evidence of tricuspid stenosis. Aortic Valve: The aortic valve is normal in structure. Aortic valve regurgitation is not visualized. No aortic stenosis is present. Pulmonic Valve: The pulmonic valve was normal in structure. Pulmonic valve regurgitation is not visualized. No evidence of pulmonic stenosis. Aorta: The aortic root is normal in size and structure. Venous: The inferior vena cava is normal in size with greater than 50% respiratory variability, suggesting right atrial pressure of 3 mmHg. IAS/Shunts: The interatrial septum was not well visualized.  LEFT VENTRICLE PLAX 2D LVIDd:         4.30 cm  Diastology LVIDs:         2.60 cm  LV e' medial:    8.16 cm/s LV PW:         0.90 cm  LV E/e' medial:  10.8 LV IVS:        0.90 cm  LV e' lateral:   10.60 cm/s LVOT diam:     1.90 cm  LV E/e' lateral: 8.3 LV SV:         54 LV SV Index:   37 LVOT Area:     2.84 cm  RIGHT VENTRICLE RV S prime:     17.20 cm/s TAPSE (M-mode): 2.3 cm LEFT ATRIUM             Index       RIGHT ATRIUM           Index LA diam:        2.90 cm 1.99 cm/m  RA Area:     10.60 cm LA Vol (A2C):   20.5 ml 14.09 ml/m RA Volume:   22.20 ml   15.26 ml/m LA Vol (A4C):   22.0 ml 15.12 ml/m  LA Biplane Vol: 22.6 ml 15.53 ml/m  AORTIC VALVE LVOT Vmax:   115.00 cm/s LVOT Vmean:  82.000 cm/s LVOT VTI:    0.189 m  AORTA Ao Root diam: 3.10 cm Ao Asc diam:  3.00 cm MITRAL VALVE                TRICUSPID VALVE MV Area (PHT): 3.60 cm     TR Peak grad:   34.3 mmHg MV Decel Time: 211 msec     TR Vmax:        293.00 cm/s MV E velocity: 88.30 cm/s MV A velocity: 100.00 cm/s  SHUNTS MV E/A ratio:  0.88         Systemic VTI:  0.19 m                             Systemic Diam: 1.90 cm Charlton Haws MD Electronically signed by Charlton Haws MD Signature Date/Time: 04/08/2020/10:08:03 AM    Final    CT HEMATURIA WORKUP  Result Date: 04/08/2020 CLINICAL DATA:  60 year old male with hematuria. EXAM: CT ABDOMEN AND PELVIS WITHOUT AND WITH CONTRAST TECHNIQUE: Multidetector CT imaging of the abdomen and pelvis was performed following the standard protocol before and following the bolus administration of intravenous contrast. CONTRAST:  OMNIPAQUE IOHEXOL 300 MG/ML  SOLN COMPARISON:  CT abdomen pelvis dated 09/17/2016. FINDINGS: Evaluation of this exam is limited due to motion artifact as well as streak artifact caused by patient's arms. Lower chest: Mild interstitial coarsening and right lung base atelectasis. The visualized lung bases are otherwise clear. No intra-abdominal free air or free fluid. Hepatobiliary: The liver is unremarkable. No intrahepatic biliary ductal dilatation. The gallbladder is unremarkable. Pancreas: Unremarkable. No pancreatic ductal dilatation or surrounding inflammatory changes. Spleen: Normal in size without focal abnormality. Adrenals/Urinary Tract: The adrenal glands unremarkable. There is a small stone or 2 adjacent stones with combined length of approximately 5 mm in the distal left ureter additional punctate focus may be present in the distal left ureter more distally. There is moderate left hydroureter with severe left hydronephrosis. There  is severe left renal parenchyma atrophy and cortical thinning secondary to chronic hydronephrosis. Two punctate nonobstructing left renal inferior pole calculi noted. No obvious enhancing lesion identified. Evaluation however is very limited due to respiratory motion artifact. There is a linear band through the proximal left ureter with focal luminal narrowing likely representing adhesion or scarring. This can be better evaluated with direct visualization and scoping. The right kidney is unremarkable. Thin linear band noted in the proximal right ureter (coronal 39/16 and axial 39/13) likely adhesion or scarring. The urinary bladder appears unremarkable. Stomach/Bowel: There is moderate stool throughout the colon. There is no bowel obstruction or active inflammation. The appendix is normal. Vascular/Lymphatic: Moderate aortoiliac atherosclerotic disease. The IVC is unremarkable. No portal venous gas. There is no adenopathy. Reproductive: The prostate and seminal vesicles are grossly unremarkable. No pelvic mass. Other: None Musculoskeletal: Degenerative changes of the spine. No acute osseous pathology. IMPRESSION: 1. Several tiny stones in the distal left ureter. There is moderate left hydroureter and severe left hydronephrosis. 2. Severe left renal parenchyma atrophy and cortical thinning secondary to chronic hydronephrosis. 3. Linear band through the proximal left ureter with focal luminal narrowing likely representing adhesion or scarring. This can be better evaluated with direct visualization and scoping. 4. Two punctate nonobstructing left renal inferior pole calculi. 5. No bowel obstruction. Normal appendix. 6. Aortic Atherosclerosis (ICD10-I70.0).  Electronically Signed   By: Elgie Collard M.D.   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 Does the need for close, 24 hr/day medical supervision in concert with the  patient's rehab needs make it unreasonable for this patient to be served in a less intensive setting? Yes 2. Co-Morbidities requiring supervision/potential complications: HTN, hx of 3. Due to bladder management, bowel management, safety, skin/wound care, disease management, medication administration, pain management and patient education, does the patient require 24 hr/day rehab nursing? Yes 4. Does the patient require coordinated care of a physician, rehab nurse, therapy disciplines of PT, OT, SLP to address physical and functional deficits in the context of the above medical diagnosis(es)? Yes Addressing deficits in the following areas: balance, endurance, locomotion, strength, transferring, bowel/bladder control, bathing, dressing, feeding, grooming, toileting, cognition, speech, swallowing and psychosocial support 5. Can the patient actively participate in an intensive therapy program of at least 3 hrs of therapy per day at least 5 days per week? Yes 6. The potential for patient to make measurable gains while on inpatient rehab is good 7. Anticipated functional outcomes upon discharge from inpatient rehab are supervision and min assist  with PT, supervision and min assist with OT, supervision and min assist with SLP. 8. Estimated rehab length of stay to reach the above functional goals is: 20-28 days 9. Anticipated discharge destination: Home 10. Overall Rehab/Functional Prognosis: excellent  RECOMMENDATIONS: This patient's condition is appropriate for continued rehabilitative care in the following setting: CIR Patient has agreed to participate in recommended program. N/A Note that insurance prior authorization may be required for reimbursement for recommended care.  Comment: Spoke with son in front of wife. Both are supportive of an inpatient rehab admission. Wife will be main caregiver at home. Rehab Admissions Coordinator to follow up.  Thanks,  Ranelle Oyster, MD, [place] [facility]  I have  personally performed a face to face diagnostic evaluation of this patient. Additionally, I have examined pertinent labs and radiographic images. I have reviewed and concur with the physician assistant's documentation above.    Mcarthur Rossetti Angiulli, PA-C 04/09/2020

## 2020-04-10 DIAGNOSIS — I10 Essential (primary) hypertension: Secondary | ICD-10-CM | POA: Diagnosis not present

## 2020-04-10 DIAGNOSIS — N9989 Other postprocedural complications and disorders of genitourinary system: Secondary | ICD-10-CM

## 2020-04-10 DIAGNOSIS — I610 Nontraumatic intracerebral hemorrhage in hemisphere, subcortical: Secondary | ICD-10-CM | POA: Diagnosis not present

## 2020-04-10 LAB — CBC
HCT: 35.8 % — ABNORMAL LOW (ref 39.0–52.0)
Hemoglobin: 11.9 g/dL — ABNORMAL LOW (ref 13.0–17.0)
MCH: 22.8 pg — ABNORMAL LOW (ref 26.0–34.0)
MCHC: 33.2 g/dL (ref 30.0–36.0)
MCV: 68.7 fL — ABNORMAL LOW (ref 80.0–100.0)
Platelets: 258 10*3/uL (ref 150–400)
RBC: 5.21 MIL/uL (ref 4.22–5.81)
RDW: 15 % (ref 11.5–15.5)
WBC: 12.6 10*3/uL — ABNORMAL HIGH (ref 4.0–10.5)
nRBC: 0 % (ref 0.0–0.2)

## 2020-04-10 LAB — BASIC METABOLIC PANEL
Anion gap: 7 (ref 5–15)
BUN: 27 mg/dL — ABNORMAL HIGH (ref 8–23)
CO2: 21 mmol/L — ABNORMAL LOW (ref 22–32)
Calcium: 8.1 mg/dL — ABNORMAL LOW (ref 8.9–10.3)
Chloride: 109 mmol/L (ref 98–111)
Creatinine, Ser: 1.26 mg/dL — ABNORMAL HIGH (ref 0.61–1.24)
GFR, Estimated: >60 mL/min (ref >60)
Glucose, Bld: 105 mg/dL — ABNORMAL HIGH (ref 70–99)
Potassium: 3.6 mmol/L (ref 3.5–5.1)
Sodium: 137 mmol/L (ref 135–145)

## 2020-04-10 LAB — GLUCOSE, CAPILLARY
Glucose-Capillary: 103 mg/dL — ABNORMAL HIGH (ref 70–99)
Glucose-Capillary: 160 mg/dL — ABNORMAL HIGH (ref 70–99)
Glucose-Capillary: 101 mg/dL — ABNORMAL HIGH (ref 70–99)
Glucose-Capillary: 97 mg/dL (ref 70–99)

## 2020-04-10 NOTE — Progress Notes (Signed)
Physical Therapy Treatment Patient Details Name: Anthony Huber MRN: [id number] DOB: [birth year] Today's Date: 04/10/2020    History of Present Illness 60 y.o. M admitted 4/3 with nausea, HA, and L arm weakness. 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 Significant PMH: HTN, diabetes, CAD.    PT Comments    Attempted performing transfers using the stedy to improve pt safety, but pt continuing to require maxAx2 to come to stand even with knee block provided by stedy. Pt displays poor midline awareness and thus safety awareness, pushing himself to either side excessively at times even after being cued to correct with verbal, tactile, and visual cues. Tried to facilitate L leg muscular activation through weight bearing, cuing pt to look at leg, and simultaneously performing exercises R leg was doing to encourage neural crossover, but min success. [name] educated to sit slightly to pt's L to decrease L side neglect. Will continue to follow acutely. Current recommendations remain appropriate.    Follow Up Recommendations  CIR     Equipment Recommendations  3in1 (PT);Wheelchair (measurements PT);Wheelchair cushion (measurements PT)    Recommendations for Other Services       Precautions / Restrictions Precautions Precautions: Fall Precaution Comments: L hemi, neglect Restrictions Weight Bearing Restrictions: No    Mobility  Bed Mobility Overal bed mobility: Needs Assistance Bed Mobility: Supine to Sit     Supine to sit: Max assist;+2 for physical assistance;+2 for safety/equipment;HOB elevated     General bed mobility comments: Pt pivoted on buttocks to allow for improved positioning of R hand for R rail to assist in pull to sit, but pt's R elbow hurting and preventing him from pulling. Thus, maxAx2 to manage L leg off bed and ascend trunk.    Transfers Overall transfer level: Needs assistance Equipment used: Ambulation equipment used Transfers:  Sit to/from [employer] Sit to Stand: Max assist;+2 physical assistance;+2 safety/equipment   Squat pivot transfers: Total assist;+2 physical assistance;+2 safety/equipment     General transfer comment: Knees blocked with use of stedy, cuing pt to maintain midline and R hand on bar but poor following of directions. Thus, needed maxAx2 to prevent lean to either side and to power up to stand and gain enough hip extension to drop seat of stedy under pt's buttocks. TA with use of stedy to transfer to chair.  Ambulation/Gait             General Gait Details: NT, unsafe at this time.   Stairs             Wheelchair Mobility    Modified Rankin (Stroke Patients Only) Modified Rankin (Stroke Patients Only) Pre-Morbid Rankin Score: No symptoms Modified Rankin: Severe disability     Balance Overall balance assessment: Needs assistance Sitting-balance support: Feet supported Sitting balance-Leahy Scale: Poor Sitting balance - Comments: Left lateral lean,, min-maxA for sitting balance Postural control: Left lateral lean Standing balance support: Bilateral upper extremity supported Standing balance-Leahy Scale: Poor Standing balance comment: up to maxA in standing, left lateral lean.                            Cognition Arousal/Alertness: Awake/alert Behavior During Therapy: Flat affect Overall Cognitive Status: Difficult to assess Area of Impairment: Following commands;Safety/judgement;Awareness;Problem solving                       Following Commands: Follows one step commands inconsistently;Follows one step  commands with increased time Safety/Judgement: Decreased awareness of deficits;Decreased awareness of safety Awareness: Intellectual Problem Solving: Slow processing;Difficulty sequencing;Requires verbal cues;Requires tactile cues General Comments: Continues with decreased awareness of midline positioning and left sided deficits,  however, able to turn head to left with cueing. Continual multi-modal cues to cease pushing with R UE towards L, poor carryover.      Exercises Other Exercises Other Exercises: Attempting neural crossover via simultaneous bil quad sets and LAQs, min-no success on L leg Other Exercises: L cervical and eye rotation throughout session    General Comments        Pertinent Vitals/Pain Pain Assessment: Faces Faces Pain Scale: Hurts little more Pain Location: R elbow Pain Descriptors / Indicators: Discomfort;Guarding Pain Intervention(s): Monitored during session    Home Living                      Prior Function            PT Goals (current goals can now be found in the care plan section) Acute Rehab PT Goals Patient Stated Goal: did not state PT Goal Formulation: With patient/family Time For Goal Achievement: 04/22/20 Potential to Achieve Goals: Fair Progress towards PT goals: Progressing toward goals    Frequency    Min 4X/week      PT Plan Current plan remains appropriate    Co-evaluation              AM-PAC PT "6 Clicks" Mobility   Outcome Measure  Help needed turning from your back to your side while in a flat bed without using bedrails?: A Lot Help needed moving from lying on your back to sitting on the side of a flat bed without using bedrails?: A Lot Help needed moving to and from a bed to a chair (including a wheelchair)?: Total Help needed standing up from a chair using your arms (e.g., wheelchair or bedside chair)?: A Lot Help needed to walk in hospital room?: Total Help needed climbing 3-5 steps with a railing? : Total 6 Click Score: 9    End of Session Equipment Utilized During Treatment: Gait belt Activity Tolerance: Patient tolerated treatment well Patient left: in chair;with call bell/phone within reach;with chair alarm set;with family/visitor present Nurse Communication: Mobility status;Need for lift equipment PT Visit Diagnosis:  Unsteadiness on feet (R26.81);Difficulty in walking, not elsewhere classified (R26.2);Hemiplegia and hemiparesis;Muscle weakness (generalized) (M62.81);Other symptoms and signs involving the nervous system (R29.898) Hemiplegia - Right/Left: Left Hemiplegia - dominant/non-dominant: Non-dominant Hemiplegia - caused by: Nontraumatic intracerebral hemorrhage     Time: 4970-2637 PT Time Calculation (min) (ACUTE ONLY): 31 min  Charges:  $Therapeutic Activity: 8-22 mins $Neuromuscular Re-education: 8-22 mins                     Raymond Gurney, PT, DPT Acute Rehabilitation Services  Pager: [phone] Office: [phone]    Jewel Baize 04/10/2020, 2:13 PM

## 2020-04-10 NOTE — Progress Notes (Signed)
STROKE TEAM PROGRESS NOTE   INTERVAL HISTORY Wife is at the bedside.  Patient sitting in chair, more awake alert, eyes open, interactive, still has left hemiplegia.  PT/OT recommend CIR.  Vitals:   04/10/20 0351 04/10/20 0700 04/10/20 1100 04/10/20 1500  BP: 123/69 134/83 130/81 128/71  Pulse: 75 78 76 65  Resp: 18 18 18 18   Temp: 98.5 F (36.9 C) 98.9 F (37.2 C) 98.6 F (37 C) 98 F (36.7 C)  TempSrc: Oral Axillary Axillary Axillary  SpO2: 96% 97% 98% 96%  Weight:      Height:       CBC:  Recent Labs  Lab 04/06/20 2350 04/07/20 0056 04/09/20 0211 04/10/20 0509  WBC 15.7*   < > 15.4* 12.6*  NEUTROABS 8.8*  --   --   --   HGB 12.6*   < > 13.3 11.9*  HCT 40.0   < > 40.0 35.8*  MCV 71.0*   < > 66.4* 68.7*  PLT 301   < > 287 258   < > = values in this interval not displayed.   Basic Metabolic Panel:  Recent Labs  Lab 04/09/20 0211 04/10/20 0509  NA 135 137  K 3.8 3.6  CL 104 109  CO2 20* 21*  GLUCOSE 121* 105*  BUN 19 27*  CREATININE 1.19 1.26*  CALCIUM 8.7* 8.1*   Lipid Panel:  Recent Labs  Lab 04/07/20 1644 04/08/20 0707  CHOL 173  --   TRIG 238* 238*  HDL 36*  --   CHOLHDL 4.8  --   VLDL 48*  --   LDLCALC 89  --    HgbA1c:  Recent Labs  Lab 04/06/20 2350  HGBA1C 5.8*   Urine Drug Screen: No results for input(s): LABOPIA, COCAINSCRNUR, LABBENZ, AMPHETMU, THCU, LABBARB in the last 168 hours.  Alcohol Level No results for input(s): ETH in the last 168 hours.  IMAGING past 24 hours  MRI  Result Date: 04/08/2020 1. Stable size and morphology of right basal ganglia intraparenchymal hemorrhage with similar surrounding vasogenic edema and regional mass effect. Associated 5 mm right-to-left shift, stable. No intraventricular extension, hydrocephalus, or ventricular trapping. No appreciable underlying lesion on this noncontrast examination. 2. Underlying mild chronic microvascular ischemic disease.  MRA Head Result Date: 04/08/2020 1. Negative  intracranial MRA. No intracranial aneurysm or other vascular abnormality seen underlying the right basal ganglia hemorrhage. No large vessel occlusion. 2. 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 No other hemodynamically significant stenosis. 3. 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 Fetal type origin of the right PCA.  Echo 04/08/2020 1. Left ventricular ejection fraction, by estimation, is 65 to 70%. The  left ventricle has normal function. The left ventricle has no regional  wall motion abnormalities. Left ventricular diastolic parameters were  normal.  2. Right ventricular systolic function is normal. The right ventricular  size is normal. There is mildly elevated pulmonary artery systolic  pressure.  3. The mitral valve is normal in structure. Trivial mitral valve  regurgitation. No evidence of mitral stenosis.  4. The aortic valve is normal in structure. Aortic valve regurgitation is  not visualized. No aortic stenosis is present.  5. The inferior vena cava is normal in size with greater than 50%  respiratory variability, suggesting right atrial pressure of 3 mmHg.   CT HEAD WO CONTRAST Result Date: 04/07/2020  IMPRESSION:  1. Acute right basal ganglia hemorrhage not significantly changed since last night, estimated blood volume 22 mL. No  intraventricular or extra-axial extension. Stable surrounding edema and mild regional mass effect.  2. No new intracranial abnormality.  CT HEAD CODE STROKE WO CONTRAST Result Date: 04/06/2020 IMPRESSION: 1. 4.3 x 2.4 x 3.4 cm acute intraparenchymal hemorrhage centered at the right basal ganglia, estimated volume 18 cc. Localized mass effect with up to 5 mm of right-to-left shift.  2. Underlying moderate chronic microvascular ischemic disease.    PHYSICAL EXAM  Temp:  [97.9 F (36.6 C)-98.9 F (37.2 C)] 98 F (36.7 C) (04/06 1500) Pulse Rate:  [64-80] 65 (04/06 1500) Resp:  [14-18] 18 (04/06 1500) BP:  (109-142)/(68-83) 128/71 (04/06 1500) SpO2:  [96 %-100 %] 96 % (04/06 1500) Weight:  [51.3 kg] 51.3 kg (04/05 2333)  General - Well nourished, well developed, in no apparent distress   Ophthalmologic - fundi not visualized due to noncooperation.  Cardiovascular - Regular rhythm and rate.  Musculoskeletal - right eblow pain on extension or flexion, but no significant tender on touch  Neuro - lethargic but eyes open on voice, awake, alert, orientated to age, place, and people. No aphasia but paucity of speech, only answers questions with brief answers, following all simple commands. Able to repeat simple sentence, wife denies dysarthria. Right gaze preference, barely cross midline, tracking on the right, not blinking to visual threat on the left, PERRL. Right facial droop. Tongue midline. LUE flaccid, LLE 1/5, RUE and RLE spontaneous movement and against gravity, RUE at least 4/5 and RLE at least 3/5. Sensation symmetrical bilaterally subjectively, FTN not able to do due to right UE pain at elbow and left UE flaccid, gait not tested.    ASSESSMENT/PLAN Mr. Elsie Sakuma is a 60 y.o. male with history of hypertension, DM, CAD presenting with subcortical hematoma likely due to hypertension.   ICH - acute right BG hemorrhage - likely due to uncontrolled HTN  CT head: 4.3 x 2.4 x 3.4 cm acute intraparenchymal hemorrhage centered at the right basal ganglia, estimated volume 18 cc. Localized mass effect with up to 5 mm of right-to-left shift.  MRI: No underlying lesion. Stable right BG hemorrhage with vasogenic edema and right to left shift stable    MRA: No aneurysm,no vascular abnormality, moderate stenosis of mid basilar artery. 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   Therapy recommendations:  OT/PT CIR  Disposition:   pending  Hypertensive emergency  Home meds:  None  Off cleviprex infusion now  Add amlodipine 10mg  daily, metoprolol 50mg  bid and clonidine 0.1 bid . SBP goal <160 in the setting of hemorrhage . Long-term BP goal normotensive  Hyperlipidemia  Home meds: none  LDL 89, goal < 70  Consider statin at discharge  Diabetes type II Controlled  Home meds:  none  HgbA1c 5.8, goal < 7.0  CBGs  SSI  Close PCP follow-up  Urinary incontinence   Hx of multiple kidney infections, kidney stones, multiple surgeries.   S/p left NEPHROLITHOTOMY PERCUTANEOUS and Laser endoureterotomy 09/2016 with Dr. at [facility]  Had urine draining from lower abdominal incision -> stopped after foley catheter placement.   CT severe left renal atrophy, moderate left hydroureter and severe left hydronephrosis  Urology recs appreciated  Will keep foley for now and follow up with urology as outpt  AKI  Creatinine 1.1->0.9->1.25->1.19->1.26  Off IVF  Encourage p.o. intake  BMP  monitoring  Right elbow pain  X-ray advanced elbow joint degenerative changes, no dislocation or fracture  Pain with elbow extension or flexion  PT/OT  Other Stroke Risk Factors  Former Cigarette smoker quit 6 years ago  Coronary artery disease: Coronary angioplasty with stent placement  Other active issue  Leukocytosis WBC 15.7>14.7->15.4->12.6  Hospital day # 4  Neurology will sign off. Please call with questions. Pt will follow up with stroke clinic NP at [facility] in about 4 weeks. Thanks for the consult.   Marvel Plan, MD PhD Stroke Neurology 04/10/2020 7:44 PM   To contact Stroke Continuity provider, please refer to [email or web address]. After hours, contact General Neurology

## 2020-04-10 NOTE — Progress Notes (Addendum)
PROGRESS NOTE        PATIENT DETAILS Name: Anthony Huber Age: 60 y.o. Sex: male Date of Birth: Oct 19, 1960 Admit Date: 04/06/2020 Admitting Physician Rejeana Brock, MD [id number], No Pcp Per (Inactive)  Brief Narrative: Patient is a 60 y.o. male with history of HTN, DM, CAD, urolithiasis/left ureteral stricture-s/p open ureterolithotomy-left percutaneous nephrostomy/laser iridotomy in 2018-presented with left sided weakness with a subcortical right-sided hemorrhage.  Significant events: 4/2>> admit to ICU by neurology for ICH.  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 4/4>> Echo: EF 65-70% 4/4>> CT abdomen/pelvis: Tiny stones in distal left ureter, moderate left ureter with severe left hydronephrosis.  Antimicrobial therapy: None  Microbiology data: None  Procedures : None  Consults: Neurology, urology  DVT Prophylaxis : heparin injection 5,000 Units Start: 04/08/20 2200 SCD's Start: 04/06/20 2322   Subjective: Continues to have left hemiparesis.  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  Hypertensive emergency: Required Cleviprex infusion on admission-BP now stable.  HTN: BP stable-continue amlodipine, metoprolol and clonidine.  SBP goal less than 160.  HLD: Plan on starting statins when closer to discharge  Vesicocutaneous fistula: Urology following with recommendations to keep Foley catheter in place for at least 2 to 4 weeks  Left hydronephrosis: Discussed with urology-Dr. Andree Coss 4/6-chronic issue-does  not require any further intervention.  Leukocytosis: Probably due to stress margination-downtrending-no indication of any infection.  Watch periodically.  Mild AKI: Volume status stable-stable for continued close follow-up.  History of CAD: No anginal symptoms-unable to start antiplatelets due to ICH.    Diet: Diet Order            Diet Carb Modified Fluid consistency: Thin; Room service appropriate? Yes with Assist  Diet effective now                  Code Status: Full code   Family Communication: Spouse at bedside   Disposition Plan: Status is: Inpatient  Remains inpatient appropriate because:Inpatient level of care appropriate due to severity of illness   Dispo: The patient is from: Home              Anticipated d/c is to: CIR              Patient currently is medically stable to d/c.   Difficult to place patient No   Barriers to Discharge: Awaiting insurance authorization for CIR  Antimicrobial agents: Anti-infectives (From admission, onward)   None       Time spent: 25- minutes-Greater than 50% of this time was spent in counseling, explanation of diagnosis, planning of further management, and coordination of care.  MEDICATIONS: Scheduled Meds: .  stroke: mapping our early stages of recovery book   Does not apply Once  . amLODipine  10 mg Oral Daily  . Chlorhexidine Gluconate Cloth  6 each Topical Daily  . cloNIDine  0.1 mg Oral BID  . heparin injection (subcutaneous)  5,000 Units Subcutaneous Q12H  . insulin aspart  0-6 Units Subcutaneous TID WC  . metoprolol tartrate  50  mg Oral BID  . pantoprazole  40 mg Oral Daily  . senna-docusate  1 tablet Oral BID   Continuous Infusions: . sodium chloride 10 mL/hr at 04/10/20 0748   PRN Meds:.acetaminophen **OR** acetaminophen (TYLENOL) oral liquid 160 mg/5 mL **OR** acetaminophen, hydrALAZINE, labetalol, ondansetron (ZOFRAN) IV   PHYSICAL EXAM: Vital signs: Vitals:   04/09/20 2100 04/09/20 2333  04/10/20 0351 04/10/20 0700  BP: 133/78 (!) 142/82 123/69 134/83  Pulse: 80 77 75 78  Resp: 14 18 18 18   Temp:  97.9 F (36.6 C) 98.5 F (36.9 C) 98.9 F (37.2 C)  TempSrc:  Oral Oral Axillary  SpO2: 100% 98% 96% 97%  Weight:  51.3 kg    Height:  4\' 11"  (1.499 m)     Filed Weights   04/06/20 2300 04/09/20 2333  Weight: 51.9 kg 51.3 kg   Body mass index is 22.84 kg/m.   Gen Exam:Alert awake-not in any distress HEENT:atraumatic, normocephalic Chest: B/L clear to auscultation anteriorly CVS:S1S2 regular Abdomen:soft non tender, non distended.  Dressing in place-dry. Extremities:no edema Neurology: Left hemiparesis Skin: no rash  I have personally reviewed following labs and imaging studies  LABORATORY DATA: CBC: Recent Labs  Lab 04/06/20 2350 04/07/20 0056 04/08/20 0707 04/09/20 0211 04/10/20 0509  WBC 15.7*  --  14.7* 15.4* 12.6*  NEUTROABS 8.8*  --   --   --   --   HGB 12.6* 13.9 13.6 13.3 11.9*  HCT 40.0 41.0 41.5 40.0 35.8*  MCV 71.0*  --  69.1* 66.4* 68.7*  PLT 301  --  307 287 258    Basic Metabolic Panel: Recent Labs  Lab 04/06/20 2350 04/07/20 0056 04/08/20 0707 04/09/20 0211 04/10/20 0509  NA 138 139 139 135 137  K 4.1 3.9 3.9 3.8 3.6  CL 107 109 104 104 109  CO2 22  --  23 20* 21*  GLUCOSE 105* 125* 138* 121* 105*  BUN 15 21 21 19  27*  CREATININE 1.10 0.90 1.25* 1.19 1.26*  CALCIUM 8.6*  --  8.7* 8.7* 8.1*    GFR: Estimated Creatinine Clearance: 41.5 mL/min (A) (by C-G formula based on SCr of 1.26 mg/dL (H)).  Liver Function Tests: Recent Labs  Lab 04/06/20 2350  AST 23  ALT 9  ALKPHOS 76  BILITOT 0.5  PROT 7.5  ALBUMIN 3.4*   No results for input(s): LIPASE, AMYLASE in the last 168 hours. No results for input(s): AMMONIA in the last 168 hours.  Coagulation Profile: Recent Labs  Lab 04/06/20 2350  INR 0.9    Cardiac Enzymes: No results for input(s): CKTOTAL, CKMB, CKMBINDEX, TROPONINI in the last 168 hours.  BNP (last  3 results) No results for input(s): PROBNP in the last 8760 hours.  Lipid Profile: Recent Labs    04/07/20 1644 04/08/20 0707  CHOL 173  --   HDL 36*  --   LDLCALC 89  --   TRIG 238* 238*  CHOLHDL 4.8  --     Thyroid Function Tests: No results for input(s): TSH, T4TOTAL, FREET4, T3FREE, THYROIDAB in the last 72 hours.  Anemia Panel: No results for input(s): VITAMINB12, FOLATE, FERRITIN, TIBC, IRON, RETICCTPCT in the last 72 hours.  Urine analysis:    Component Value Date/Time   COLORURINE YELLOW 04/08/2020 1438   APPEARANCEUR CLEAR 04/08/2020 1438   LABSPEC 1.015 04/08/2020 1438   PHURINE 5.0 04/08/2020 1438   GLUCOSEU NEGATIVE 04/08/2020 1438   HGBUR SMALL (A) 04/08/2020 1438   BILIRUBINUR NEGATIVE 04/08/2020 1438  KETONESUR NEGATIVE 04/08/2020 1438   PROTEINUR 30 (A) 04/08/2020 1438   NITRITE NEGATIVE 04/08/2020 1438   LEUKOCYTESUR TRACE (A) 04/08/2020 1438    Sepsis Labs: Lactic Acid, Venous    Component Value Date/Time   LATICACIDVEN 1.15 09/05/2016 1742    MICROBIOLOGY: Recent Results (from the past 240 hour(s))  Resp Panel by RT-PCR (Flu A&B, Covid) Nasopharyngeal Swab     Status: None   Collection Time: 04/07/20 12:20 AM   Specimen: Nasopharyngeal Swab; Nasopharyngeal(NP) swabs in vial transport medium  Result Value Ref Range Status   SARS Coronavirus 2 by RT PCR NEGATIVE NEGATIVE Final    Comment: (NOTE) SARS-CoV-2 target nucleic acids are NOT DETECTED.  The SARS-CoV-2 RNA is generally detectable in upper respiratory specimens during the acute phase of infection. The lowest concentration of SARS-CoV-2 viral copies this assay can detect is 138 copies/mL. A negative result does not preclude SARS-Cov-2 infection and should not be used as the sole basis for treatment or other patient management decisions. A negative result may occur with  improper specimen collection/handling, submission of specimen other than nasopharyngeal swab, presence of viral  mutation(s) within the areas targeted by this assay, and inadequate number of viral copies(<138 copies/mL). A negative result must be combined with clinical observations, patient history, and epidemiological information. The expected result is Negative.  Fact Sheet for Patients:  BloggerCourse.com  Fact Sheet for Healthcare Providers:  SeriousBroker.it  This test is no t yet approved or cleared by the [place] FDA and  has been authorized for detection and/or diagnosis of SARS-CoV-2 by FDA under an Emergency Use Authorization (EUA). This EUA will remain  in effect (meaning this test can be used) for the duration of the COVID-19 declaration under Section 564(b)(1) of the Act, 21 U.S.C.section 360bbb-3(b)(1), unless the authorization is terminated  or revoked sooner.       Influenza A by PCR NEGATIVE NEGATIVE Final   Influenza B by PCR NEGATIVE NEGATIVE Final    Comment: (NOTE) The Xpert Xpress SARS-CoV-2/FLU/RSV plus assay is intended as an aid in the diagnosis of influenza from Nasopharyngeal swab specimens and should not be used as a sole basis for treatment. Nasal washings and aspirates are unacceptable for Xpert Xpress SARS-CoV-2/FLU/RSV testing.  Fact Sheet for Patients: BloggerCourse.com  Fact Sheet for Healthcare Providers: SeriousBroker.it  This test is not yet approved or cleared by the [place] FDA and has been authorized for detection and/or diagnosis of SARS-CoV-2 by FDA under an Emergency Use Authorization (EUA). This EUA will remain in effect (meaning this test can be used) for the duration of the COVID-19 declaration under Section 564(b)(1) of the Act, 21 U.S.C. section 360bbb-3(b)(1), unless the authorization is terminated or revoked.  Performed at [facility], [address], [place], [place] [address]   MRSA PCR Screening     Status: None    Collection Time: 04/07/20  4:30 PM   Specimen: Nasopharyngeal  Result Value Ref Range Status   MRSA by PCR NEGATIVE NEGATIVE Final    Comment:        The GeneXpert MRSA Assay (FDA approved for NASAL specimens only), is one component of a comprehensive MRSA colonization surveillance program. It is not intended to diagnose MRSA infection nor to guide or monitor treatment for MRSA infections. Performed at [facility], [address], [place], [place] [address]     RADIOLOGY STUDIES/RESULTS: DG Elbow 2 Views Right  Result Date: 04/09/2020 CLINICAL DATA:  Right elbow pain. EXAM:  RIGHT ELBOW - 2 VIEW COMPARISON:  None FINDINGS: Advanced elbow joint degenerative changes with joint space narrowing and osteophytic spurring. No acute bony findings or osteochondral lesion. Could not exclude the possibility of the ossified loose body in the joint no obvious joint effusion. IMPRESSION: Advanced elbow joint degenerative changes. No acute bony findings or joint effusion. Electronically Signed   By: Rudie Meyer M.D.   On: 04/09/2020 14:27   DG CHEST PORT 1 VIEW  Result Date: 04/08/2020 CLINICAL DATA:  Stroke EXAM: PORTABLE CHEST 1 VIEW COMPARISON:  None. FINDINGS: The heart size and mediastinal contours are within normal limits. Prominence of the central pulmonary vasculature is seen. The visualized skeletal structures are unremarkable. IMPRESSION: Mild pulmonary vascular congestion.  For Electronically Signed   By: Jonna Clark M.D.   On: 04/08/2020 15:03   CT HEMATURIA WORKUP  Result Date: 04/08/2020 CLINICAL DATA:  60 year old male with hematuria. EXAM: CT ABDOMEN AND PELVIS WITHOUT AND WITH CONTRAST TECHNIQUE: Multidetector CT imaging of the abdomen and pelvis was performed following the standard protocol before and following the bolus administration of intravenous contrast. CONTRAST:  OMNIPAQUE IOHEXOL 300 MG/ML  SOLN COMPARISON:  CT abdomen pelvis dated 09/17/2016. FINDINGS:  Evaluation of this exam is limited due to motion artifact as well as streak artifact caused by patient's arms. Lower chest: Mild interstitial coarsening and right lung base atelectasis. The visualized lung bases are otherwise clear. No intra-abdominal free air or free fluid. Hepatobiliary: The liver is unremarkable. No intrahepatic biliary ductal dilatation. The gallbladder is unremarkable. Pancreas: Unremarkable. No pancreatic ductal dilatation or surrounding inflammatory changes. Spleen: Normal in size without focal abnormality. Adrenals/Urinary Tract: The adrenal glands unremarkable. There is a small stone or 2 adjacent stones with combined length of approximately 5 mm in the distal left ureter additional punctate focus may be present in the distal left ureter more distally. There is moderate left hydroureter with severe left hydronephrosis. There is severe left renal parenchyma atrophy and cortical thinning secondary to chronic hydronephrosis. Two punctate nonobstructing left renal inferior pole calculi noted. No obvious enhancing lesion identified. Evaluation however is very limited due to respiratory motion artifact. There is a linear band through the proximal left ureter with focal luminal narrowing likely representing adhesion or scarring. This can be better evaluated with direct visualization and scoping. The right kidney is unremarkable. Thin linear band noted in the proximal right ureter (coronal 39/16 and axial 39/13) likely adhesion or scarring. The urinary bladder appears unremarkable. Stomach/Bowel: There is moderate stool throughout the colon. There is no bowel obstruction or active inflammation. The appendix is normal. Vascular/Lymphatic: Moderate aortoiliac atherosclerotic disease. The IVC is unremarkable. No portal venous gas. There is no adenopathy. Reproductive: The prostate and seminal vesicles are grossly unremarkable. No pelvic mass. Other: None Musculoskeletal: Degenerative changes of the  spine. No acute osseous pathology. IMPRESSION: 1. Several tiny stones in the distal left ureter. There is moderate left hydroureter and severe left hydronephrosis. 2. Severe left renal parenchyma atrophy and cortical thinning secondary to chronic hydronephrosis. 3. Linear band through the proximal left ureter with focal luminal narrowing likely representing adhesion or scarring. This can be better evaluated with direct visualization and scoping. 4. Two punctate nonobstructing left renal inferior pole calculi. 5. No bowel obstruction. Normal appendix. 6. Aortic Atherosclerosis (ICD10-I70.0). Electronically Signed   By: Elgie Collard M.D.   On: 04/08/2020 23:05     LOS: 4 days   Jeoffrey Massed, MD  [facility]    To contact  the attending provider between 7A-7P or the covering provider during after hours 7P-7A, please log into the web site [email or web address] and access using universal [place] password for that web site. If you do not have the password, please call the hospital operator.  04/10/2020, 10:50 AM

## 2020-04-10 NOTE — Consult Note (Signed)
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  The fluid sent from the draining fluid was consistent with urine based on its creatinine.  A Foley catheter was placed, and the drainage from his lower midline incision has resolved.  The patient was transferred to 3 W. and is stable.  He is very somnolent.  Active Problems:   ICH (intracerebral hemorrhage) (HCC)  Current Facility-Administered Medications  Medication Dose Route Frequency Provider Last Rate Last Admin  .  stroke: mapping our early stages of recovery book   Does not apply Once Rejeana Brock, MD      . 0.9 %  sodium chloride infusion   Intravenous Continuous Maretta Bees, MD 10 mL/hr at 04/10/20 0748 Rate Change at 04/10/20 0748  . acetaminophen (TYLENOL) tablet 650 mg  650 mg Oral Q4H PRN Rejeana Brock, MD   650 mg at 04/09/20 1255   Or  . acetaminophen (TYLENOL) 160 MG/5ML solution 650 mg  650 mg Per Tube Q4H PRN Rejeana Brock, MD       Or  . acetaminophen (TYLENOL) suppository 650 mg  650 mg Rectal Q4H PRN Rejeana Brock, MD      . amLODipine (NORVASC) tablet 10 mg  10 mg Oral Daily Olivencia-Simmons, Ivelisse, NP   10 mg at 04/10/20 1002  . Chlorhexidine Gluconate Cloth 2 % PADS 6 each  6 each Topical Daily Marvel Plan, MD   6 each at 04/10/20 1003  . cloNIDine (CATAPRES) tablet 0.1 mg  0.1 mg Oral BID Marvel Plan, MD   0.1 mg at 04/10/20 1002  . heparin injection 5,000 Units  5,000 Units Subcutaneous Q12H Marvel Plan, MD   5,000 Units at 04/10/20 1005  . hydrALAZINE (APRESOLINE) injection 5-10 mg  5-10 mg Intravenous Q4H PRN Marvel Plan, MD      . insulin aspart (novoLOG) injection 0-6 Units  0-6 Units Subcutaneous TID WC Marvel Plan, MD   1 Units at 04/09/20 1722  . labetalol (NORMODYNE) injection 5-20 mg  5-20 mg Intravenous Q2H PRN Marvel Plan, MD      . metoprolol tartrate (LOPRESSOR) tablet 50 mg  50 mg Oral BID Marvel Plan, MD   50 mg at 04/10/20 1002  . ondansetron (ZOFRAN) injection 4 mg  4 mg Intravenous Q6H PRN Marvel Plan, MD      . pantoprazole (PROTONIX) EC tablet 40 mg  40 mg Oral Daily Marvel Plan, MD   40 mg at 04/10/20 1002  . senna-docusate (Senokot-S) tablet 1 tablet  1 tablet Oral BID Rejeana Brock, MD   1 tablet at 04/10/20 1002     Objective: Vital: Vitals:   04/09/20 2333 04/10/20 0351 04/10/20 0700 04/10/20 1100  BP: (!) 142/82 123/69 134/83 130/81  Pulse: 77 75 78 76  Resp: 18 18 18 18   Temp: 97.9 F (36.6 C) 98.5 F (36.9 C) 98.9 F (37.2 C) 98.6 F (37 C)  TempSrc: Oral Oral Axillary Axillary  SpO2: 98% 96% 97% 98%  Weight: 51.3 kg     Height: 4\' 11"  (1.499 m)      I/Os: I/O last 3 completed shifts: In: 1681.9 [I.V.:1681.9] Out: 1850 [Urine:1850]  Physical Exam:  General: Patient is in no apparent distress Lungs: Normal respiratory effort, chest expands symmetrically. GI: Small pinhole opening in the lower midline incision, dry Foley: Foley catheter is draining clear yellow urine Ext: lower  extremities symmetric  Lab Results: Recent Labs    04/08/20 0707 04/09/20 0211 04/10/20 0509  WBC 14.7* 15.4* 12.6*  HGB 13.6 13.3 11.9*  HCT 41.5 40.0 35.8*   Recent Labs    04/08/20 0707 04/09/20 0211 04/10/20 0509  NA 139 135 137  K 3.9 3.8 3.6  CL 104 104 109  CO2 23 20* 21*  GLUCOSE 138* 121* 105*  BUN 21 19 27*  CREATININE 1.25* 1.19 1.26*  CALCIUM 8.7* 8.7* 8.1*   No results for input(s): LABPT, INR in the last 72 hours. No results for input(s): LABURIN in the last 72 hours. Results for orders placed or performed during the hospital encounter of 04/06/20  Resp Panel by RT-PCR (Flu A&B, Covid) Nasopharyngeal Swab     Status: None   Collection Time: 04/07/20 12:20 AM   Specimen: Nasopharyngeal Swab; Nasopharyngeal(NP) swabs in vial transport medium  Result  Value Ref Range Status   SARS Coronavirus 2 by RT PCR NEGATIVE NEGATIVE Final    Comment: (NOTE) SARS-CoV-2 target nucleic acids are NOT DETECTED.  The SARS-CoV-2 RNA is generally detectable in upper respiratory specimens during the acute phase of infection. The lowest concentration of SARS-CoV-2 viral copies this assay can detect is 138 copies/mL. A negative result does not preclude SARS-Cov-2 infection and should not be used as the sole basis for treatment or other patient management decisions. A negative result may occur with  improper specimen collection/handling, submission of specimen other than nasopharyngeal swab, presence of viral mutation(s) within the areas targeted by this assay, and inadequate number of viral copies(<138 copies/mL). A negative result must be combined with clinical observations, patient history, and epidemiological information. The expected result is Negative.  Fact Sheet for Patients:  BloggerCourse.comhttps://www.fda.gov/media/152166/download  Fact Sheet for Healthcare Providers:  SeriousBroker.ithttps://www.fda.gov/media/152162/download  This test is no t yet approved or cleared by the [place] FDA and  has been authorized for detection and/or diagnosis of SARS-CoV-2 by FDA under an Emergency Use Authorization (EUA). This EUA will remain  in effect (meaning this test can be used) for the duration of the COVID-19 declaration under Section 564(b)(1) of the Act, 21 U.S.C.section 360bbb-3(b)(1), unless the authorization is terminated  or revoked sooner.       Influenza A by PCR NEGATIVE NEGATIVE Final   Influenza B by PCR NEGATIVE NEGATIVE Final    Comment: (NOTE) The Xpert Xpress SARS-CoV-2/FLU/RSV plus assay is intended as an aid in the diagnosis of influenza from Nasopharyngeal swab specimens and should not be used as a sole basis for treatment. Nasal washings and aspirates are unacceptable for Xpert Xpress SARS-CoV-2/FLU/RSV testing.  Fact Sheet for  Patients: BloggerCourse.comhttps://www.fda.gov/media/152166/download  Fact Sheet for Healthcare Providers: SeriousBroker.ithttps://www.fda.gov/media/152162/download  This test is not yet approved or cleared by the [place] FDA and has been authorized for detection and/or diagnosis of SARS-CoV-2 by FDA under an Emergency Use Authorization (EUA). This EUA will remain in effect (meaning this test can be used) for the duration of the COVID-19 declaration under Section 564(b)(1) of the Act, 21 U.S.C. section 360bbb-3(b)(1), unless the authorization is terminated or revoked.  Performed at [facility], [address] [place], [place] [address]   MRSA PCR Screening     Status: None   Collection Time: 04/07/20  4:30 PM   Specimen: Nasopharyngeal  Result Value Ref Range Status   MRSA by PCR NEGATIVE NEGATIVE Final    Comment:        The GeneXpert MRSA Assay (FDA approved for NASAL  specimens only), is one component of a comprehensive MRSA colonization surveillance program. It is not intended to diagnose MRSA infection nor to guide or monitor treatment for MRSA infections. Performed at [facility], [address] [place], [place] [address]     Studies/Results: DG Elbow 2 Views Right  Result Date: 04/09/2020 CLINICAL DATA:  Right elbow pain. EXAM: RIGHT ELBOW - 2 VIEW COMPARISON:  None FINDINGS: Advanced elbow joint degenerative changes with joint space narrowing and osteophytic spurring. No acute bony findings or osteochondral lesion. Could not exclude the possibility of the ossified loose body in the joint no obvious joint effusion. IMPRESSION: Advanced elbow joint degenerative changes. No acute bony findings or joint effusion. Electronically Signed   By: Rudie Meyer M.D.   On: 04/09/2020 14:27   DG CHEST PORT 1 VIEW  Result Date: 04/08/2020 CLINICAL DATA:  Stroke EXAM: PORTABLE CHEST 1 VIEW COMPARISON:  None. FINDINGS: The heart size and mediastinal contours are within normal limits. Prominence  of the central pulmonary vasculature is seen. The visualized skeletal structures are unremarkable. IMPRESSION: Mild pulmonary vascular congestion.  For Electronically Signed   By: Jonna Clark M.D.   On: 04/08/2020 15:03   CT HEMATURIA WORKUP  Result Date: 04/08/2020 CLINICAL DATA:  60 year old male with hematuria. EXAM: CT ABDOMEN AND PELVIS WITHOUT AND WITH CONTRAST TECHNIQUE: Multidetector CT imaging of the abdomen and pelvis was performed following the standard protocol before and following the bolus administration of intravenous contrast. CONTRAST:  OMNIPAQUE IOHEXOL 300 MG/ML  SOLN COMPARISON:  CT abdomen pelvis dated 09/17/2016. FINDINGS: Evaluation of this exam is limited due to motion artifact as well as streak artifact caused by patient's arms. Lower chest: Mild interstitial coarsening and right lung base atelectasis. The visualized lung bases are otherwise clear. No intra-abdominal free air or free fluid. Hepatobiliary: The liver is unremarkable. No intrahepatic biliary ductal dilatation. The gallbladder is unremarkable. Pancreas: Unremarkable. No pancreatic ductal dilatation or surrounding inflammatory changes. Spleen: Normal in size without focal abnormality. Adrenals/Urinary Tract: The adrenal glands unremarkable. There is a small stone or 2 adjacent stones with combined length of approximately 5 mm in the distal left ureter additional punctate focus may be present in the distal left ureter more distally. There is moderate left hydroureter with severe left hydronephrosis. There is severe left renal parenchyma atrophy and cortical thinning secondary to chronic hydronephrosis. Two punctate nonobstructing left renal inferior pole calculi noted. No obvious enhancing lesion identified. Evaluation however is very limited due to respiratory motion artifact. There is a linear band through the proximal left ureter with focal luminal narrowing likely representing adhesion or scarring. This can be better  evaluated with direct visualization and scoping. The right kidney is unremarkable. Thin linear band noted in the proximal right ureter (coronal 39/16 and axial 39/13) likely adhesion or scarring. The urinary bladder appears unremarkable. Stomach/Bowel: There is moderate stool throughout the colon. There is no bowel obstruction or active inflammation. The appendix is normal. Vascular/Lymphatic: Moderate aortoiliac atherosclerotic disease. The IVC is unremarkable. No portal venous gas. There is no adenopathy. Reproductive: The prostate and seminal vesicles are grossly unremarkable. No pelvic mass. Other: None Musculoskeletal: Degenerative changes of the spine. No acute osseous pathology. IMPRESSION: 1. Several tiny stones in the distal left ureter. There is moderate left hydroureter and severe left hydronephrosis. 2. Severe left renal parenchyma atrophy and cortical thinning secondary to chronic hydronephrosis. 3. Linear band through the proximal left ureter with focal luminal narrowing likely representing adhesion or scarring.  This can be better evaluated with direct visualization and scoping. 4. Two punctate nonobstructing left renal inferior pole calculi. 5. No bowel obstruction. Normal appendix. 6. Aortic Atherosclerosis (ICD10-I70.0). Electronically Signed   By: Elgie Collard M.D.   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  We will plan to keep the catheter now for the next 2 or 3 weeks and then have the patient return to the office where we will perform a cystogram and see if the fistula has closed.  The patient also has significant right-sided renal atrophy and Hydro down to the bladder.  This is indicative of chronic obstruction, and the kidney on the left has relatively little/no function at this time.  There is no utility in performing a left percutaneous nephrostomy tube in attempt to unobstruct  the kidney because the kidney does not have enough function to make this a worthwhile venture.  Plan: Keep the catheter for 2 weeks, follow-up with urology.  We will contact the patient and schedule follow-up. Nothing to do with the left hydronephrosis given the atrophy and nonfunctional status.  We will follow-up as needed while the patient is in the hospital, please contact us with any additional questions or concerns.   Berniece Salines, MD Urology 04/10/2020, 1:36 PM

## 2020-04-10 NOTE — Progress Notes (Signed)
Inpatient Rehabilitation Admissions Coordinator  I have received insurance approval for CIR. I await bed availability over the next couple of days as patient is able to participate more with therapies.  Ottie Glazier, RN, MSN Rehab Admissions Coordinator [phone] 04/10/2020 9:36 PM

## 2020-04-10 NOTE — Progress Notes (Signed)
Inpatient Rehabilitation Admissions Coordinator  I await insurance approval for possible CIR admit.  Ottie Glazier, RN, MSN Rehab Admissions Coordinator [phone] 04/10/2020 4:26 PM

## 2020-04-10 NOTE — H&P (Signed)
Physical Medicine and Rehabilitation Admission H&P    Chief Complaint  Patient presents with  . Code Stroke  : HPI: Anthony Huber is a 60 year old right-handed male with history of open ureterlithotomy 2005 in [place] as well as left PCNL and laser ureterotomy October 2018 per Dr. Marlou Porch, CAD, diabetes mellitus and hypertension with remote tobacco use.  Per chart review lives with spouse independent prior to admission.  1 level home.  He works at a [employer], environmental.  Presented 04/06/2020 with altered mental status headache and left-sided weakness.  Blood pressure 170/100.  Cranial CT scan showed a 4.3 x 2.4 x 3.4 cm acute intraparenchymal hemorrhage centered at the right basal ganglia.  Localized mass-effect with up to 5 mm right to left shift.  Admission chemistries unremarkable except hemoglobin A1c 5.8 WBC 15,700.  MRI/MRA showed stable size and morphology of right basal ganglia intraparenchymal hemorrhage with similar surrounding vasogenic edema and regional mass-effect.  Associated 5 mm right to left shift.  No intraventricular extension hydrocephalus or ventricular trapping.  MRA was unremarkable.  Echocardiogram with ejection fraction of 65 to 70% no wall motion abnormalities.  Maintained on Cleviprex for blood pressure control.  Patient was cleared to begin subcutaneous heparin for DVT prophylaxis 04/06/2020.  Hospital course follow-up urology services 04/08/2020 for evaluation of leaking urine from his incision line which had been intermittent since ureterotomy.  CT abdomen pelvis showed several tiny stones in the distal left ureter.  Moderate left hydroureter and severe left hydronephrosis.  Severe left renal parenchymal atrophy and cortical thinning secondary to chronic hydronephrosis.  Linear band to the proximal left ureter with focal luminal narrowing likely representing adhesion or scarring with 2 punctate nonobstructing left renal inferior pole calculi and fluid cultures were sent and plan Foley  catheter tube insertion.  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  Therapy evaluations completed due to patient's weakness and decreased functional mobility was admitted for a comprehensive rehab program.  Review of Systems  Constitutional: Negative for chills and fever.  HENT: Negative for hearing loss.   Eyes: Negative for blurred vision and double vision.  Respiratory: Negative for cough and shortness of breath.   Cardiovascular: Negative for chest pain, palpitations and leg swelling.  Gastrointestinal: Positive for constipation. Negative for heartburn, nausea and vomiting.  Genitourinary: Positive for urgency. Negative for dysuria, flank pain and hematuria.  Musculoskeletal: Positive for back pain.  Skin: Negative for rash.  Neurological: Positive for weakness.  All other systems reviewed and are negative.  Past Medical History:  Diagnosis Date  . Coronary artery disease   . Diabetes mellitus without complication (HCC)   . Hypertension    Past Surgical History:  Procedure Laterality Date  . BACK SURGERY    . BLADDER SURGERY    . CORONARY ANGIOPLASTY WITH STENT PLACEMENT    . CYSTOSCOPY WITH STENT PLACEMENT Left 09/05/2016   Procedure: CYSTOSCOPY, URETEROSCOPY;  Surgeon: Crist Fat, MD;  Location: [facility];  Service: Urology;  Laterality: Left;  . IR NEPHROSTOMY PLACEMENT LEFT  09/06/2016  . NEPHROLITHOTOMY Left 09/16/2016   Procedure: NEPHROLITHOTOMY PERCUTANEOUS  LEFT;  Surgeon: Crist Fat, MD;  Location: [facility];  Service: Urology;  Laterality: Left;   No family history on file. Social History:  reports that he quit smoking about 6 years ago. He has never used smokeless tobacco. He reports  that he does not drink alcohol and does not use drugs. Allergies: No Known Allergies Medications Prior to Admission  Medication Sig Dispense Refill  .  ibuprofen (ADVIL) 200 MG tablet Take 200 mg by mouth every 6 (six) hours as needed for headache or moderate pain.    . Pseudoeph-Doxylamine-DM-APAP (NYQUIL PO) Take 1 Dose by mouth daily as needed (sore throat/congestion).    . Pseudoephedrine-APAP-DM (DAYQUIL PO) Take 1 Dose by mouth daily as needed (sore throat/congestion).    . naproxen (NAPROSYN) 500 MG tablet Take 500 mg by mouth 2 (two) times daily as needed for pain. (Patient not taking: No sig reported)  0  . omeprazole (PRILOSEC) 20 MG capsule Take 20 mg by mouth 2 (two) times daily. (Patient not taking: No sig reported)  0  . traMADol (ULTRAM) 50 MG tablet Take 1-2 tablets (50-100 mg total) by mouth every 6 (six) hours as needed for moderate pain. 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  Functional Status:  Mobility: Bed Mobility Overal bed mobility: Needs Assistance Bed Mobility: Rolling,Sidelying to Sit Rolling: Mod assist Sidelying to sit: Max assist,+2 for physical assistance Supine to sit: Max assist,+2 for physical assistance General bed mobility comments: Pt rolling to left with cues for reaching with RUE, assist for BLE's off edge of bed and trunk to upright. Transfers Overall transfer level: Needs assistance Equipment used: None Transfers: Sit to/from [employer] Sit to Stand: Max  assist,+2 physical assistance Stand pivot transfers: Max assist,+2 physical assistance Squat pivot transfers: Max assist,+2 physical assistance General transfer comment: MaxA + 2 to stand from edge of bed with left knee block, pivoting towards right from bed to chair. Unable to weight shift to take steps      ADL: ADL Overall ADL's : Needs assistance/impaired Eating/Feeding: NPO Grooming: Wash/dry face,Maximal assistance,Bed level Upper Body Bathing: Maximal assistance,Sitting Lower Body Bathing: Total assistance,Sit to/from stand,Bed level Upper Body Dressing : Maximal assistance,Sitting Lower Body Dressing: Total assistance,Sit to/from stand,Bed level Toilet Transfer: Maximal assistance,+2 for safety/equipment,+2 for physical assistance,Stand-pivot (recliner) Functional mobility during ADLs: Maximal assistance,+2 for physical assistance,+2 for safety/equipment (stand pivot) General ADL Comments: Pt presenting with decreased following of cues, attending to therapists, sitting balance, and activity tolerance  Cognition: Cognition Overall Cognitive Status: Difficult to assess Arousal/Alertness: Lethargic Orientation Level: Oriented to person,Oriented to place,Oriented to time,Disoriented to situation Attention: Sustained Sustained Attention: Impaired Sustained Attention Impairment: Verbal basic Memory:  (will be assessed further) Awareness: Impaired Awareness Impairment: Intellectual impairment,Emergent impairment,Anticipatory impairment Problem Solving: Impaired (suspect for moderately complex) Safety/Judgment: Impaired Cognition Arousal/Alertness: Awake/alert Behavior During Therapy: Flat affect Overall Cognitive Status: Difficult to assess Area of Impairment: Following commands,Safety/judgement,Awareness Orientation Level: Situation Following Commands: Follows one step commands consistently Safety/Judgement: Decreased awareness of deficits Awareness: Intellectual General  Comments: Continues with decreased awareness of midline positioning and left sided deficits, however, able to turn head to left with cueing. Following more cues this session. Difficult to assess due to: Non-English speaking  Physical  Exam: Blood pressure 134/83, pulse 78, temperature 98.9 F (37.2 C), temperature source Axillary, resp. rate 18, height  (1.499 m), weight 51.3 kg, SpO2 97 %. Physical Exam Constitutional:      General: He is not in acute distress. HENT:     Head: Normocephalic.     Right Ear: External ear normal.     Left Ear: External ear normal.     Mouth/Throat:     Mouth: Mucous membranes are moist.  Eyes:     Conjunctiva/sclera: Conjunctivae normal.  Cardiovascular:     Rate and Rhythm: Normal rate and regular rhythm.     Heart sounds: No murmur heard. No gallop.   Pulmonary:     Effort: Pulmonary effort is normal. No respiratory distress.     Breath sounds: No wheezing.  Abdominal:     General: Bowel sounds are normal. There is no distension.     Palpations: Abdomen is soft.     Tenderness: There is no abdominal tenderness.  Musculoskeletal:        General: No swelling. Normal range of motion.  Skin:    General: Skin is warm and dry.  Neurological:     Mental Status: He is alert.     Comments: Fairly alert today. Follows simple commands with tactile cues, translation. Left central 7. Seems to track to all 4 quadrants.  Moves right side spontaneously. LUE and LLE 0/5. Senses pain on right, minimal response to pain on left.   Psychiatric:     Comments: Flat, cooperates with tactile cues and when words are translated     Results for orders placed or performed during the hospital encounter of 04/06/20 (from the past 48 hour(s))  Urinalysis, Routine w reflex microscopic Urine, Clean Catch     Status: Abnormal   Collection Time: 04/08/20  2:38 PM  Result Value Ref Range   Color, Urine YELLOW YELLOW   APPearance CLEAR CLEAR   Specific Gravity, Urine  1.015 1.005 - 1.030   pH 5.0 5.0 - 8.0   Glucose, UA NEGATIVE NEGATIVE mg/dL   Hgb urine dipstick SMALL (A) NEGATIVE   Bilirubin Urine NEGATIVE NEGATIVE   Ketones, ur NEGATIVE NEGATIVE mg/dL   Protein, ur 30 (A) NEGATIVE mg/dL   Nitrite NEGATIVE NEGATIVE   Leukocytes,Ua TRACE (A) NEGATIVE   RBC / HPF 6-10 0 - 5 RBC/hpf   WBC, UA 6-10 0 - 5 WBC/hpf   Bacteria, UA MANY (A) NONE SEEN   Mucus PRESENT     Comment: Performed at [facility], [address] [place], [place] [address]  Glucose, capillary     Status: Abnormal   Collection Time: 04/08/20  3:36 PM  Result Value Ref Range   Glucose-Capillary 130 (H) 70 - 99 mg/dL    Comment: Glucose reference range applies only to samples taken after fasting for at least 8 hours.  Glucose, capillary     Status: Abnormal   Collection Time: 04/08/20  9:34 PM  Result Value Ref Range   Glucose-Capillary 121 (H) 70 - 99 mg/dL    Comment: Glucose reference range applies only to samples taken after fasting for at least 8 hours.  CBC     Status: Abnormal   Collection Time: 04/09/20  2:11 AM  Result Value Ref Range   WBC 15.4 (H) 4.0 - 10.5 K/uL   RBC 6.02 (H) 4.22 - 5.81 MIL/uL   Hemoglobin 13.3 13.0 - 17.0 g/dL   HCT 60.4 54.0 - 98.1 %  MCV 66.4 (L) 80.0 - 100.0 fL   MCH 22.1 (L) 26.0 - 34.0 pg   MCHC 33.3 30.0 - 36.0 g/dL   RDW 09.8 11.9 - 14.7 %   Platelets 287 150 - 400 K/uL    Comment: REPEATED TO VERIFY   nRBC 0.0 0.0 - 0.2 %    Comment: Performed at [facility], [address], [place], [place] [address]  Basic metabolic panel     Status: Abnormal   Collection Time: 04/09/20  2:11 AM  Result Value Ref Range   Sodium 135 135 - 145 mmol/L   Potassium 3.8 3.5 - 5.1 mmol/L   Chloride 104 98 - 111 mmol/L   CO2 20 (L) 22 - 32 mmol/L   Glucose, Bld 121 (H) 70 - 99 mg/dL    Comment: Glucose reference range applies only to samples taken after fasting for at least 8 hours.   BUN 19 8 - 23 mg/dL   Creatinine, Ser 2.13 0.61 -  1.24 mg/dL   Calcium 8.7 (L) 8.9 - 10.3 mg/dL   GFR, Estimated >08 >65 mL/min    Comment: (NOTE) Calculated using the CKD-EPI Creatinine Equation (2021)    Anion gap 11 5 - 15    Comment: Performed at [facility], [address], [place], [place] [address]  Glucose, capillary     Status: Abnormal   Collection Time: 04/09/20  7:57 AM  Result Value Ref Range   Glucose-Capillary 132 (H) 70 - 99 mg/dL    Comment: Glucose reference range applies only to samples taken after fasting for at least 8 hours.  Creatinine, fluid (pleural, peritoneal, JP Drainage)     Status: None   Collection Time: 04/09/20 10:10 AM  Result Value Ref Range   Creat, Fluid >50.0 mg/dL    Comment: RESULTS CONFIRMED BY MANUAL DILUTION (NOTE) No normal range established for this test Results should be evaluated in conjunction with serum values    Fluid Type-FCRE PERITONEAL FLUID     Comment: Performed at [facility], [address] [place], [place] [address]  Glucose, capillary     Status: Abnormal   Collection Time: 04/09/20 12:13 PM  Result Value Ref Range   Glucose-Capillary 140 (H) 70 - 99 mg/dL    Comment: Glucose reference range applies only to samples taken after fasting for at least 8 hours.  Glucose, capillary     Status: Abnormal   Collection Time: 04/09/20  5:08 PM  Result Value Ref Range   Glucose-Capillary 189 (H) 70 - 99 mg/dL    Comment: Glucose reference range applies only to samples taken after fasting for at least 8 hours.  Glucose, capillary     Status: Abnormal   Collection Time: 04/09/20 10:55 PM  Result Value Ref Range   Glucose-Capillary 111 (H) 70 - 99 mg/dL    Comment: Glucose reference range applies only to samples taken after fasting for at least 8 hours.  CBC     Status: Abnormal   Collection Time: 04/10/20  5:09 AM  Result Value Ref Range   WBC 12.6 (H) 4.0 - 10.5 K/uL   RBC 5.21 4.22 - 5.81 MIL/uL   Hemoglobin 11.9 (L) 13.0 - 17.0 g/dL   HCT 84.1 (L) 32.4 - 40.1  %   MCV 68.7 (L) 80.0 - 100.0 fL   MCH 22.8 (L) 26.0 - 34.0 pg   MCHC 33.2 30.0 - 36.0 g/dL   RDW 02.7 25.3 - 66.4 %  Platelets 258 150 - 400 K/uL    Comment: REPEATED TO VERIFY   nRBC 0.0 0.0 - 0.2 %    Comment: Performed at [facility], [address] [place], [place] [address]  Basic metabolic panel     Status: Abnormal   Collection Time: 04/10/20  5:09 AM  Result Value Ref Range   Sodium 137 135 - 145 mmol/L   Potassium 3.6 3.5 - 5.1 mmol/L   Chloride 109 98 - 111 mmol/L   CO2 21 (L) 22 - 32 mmol/L   Glucose, Bld 105 (H) 70 - 99 mg/dL    Comment: Glucose reference range applies only to samples taken after fasting for at least 8 hours.   BUN 27 (H) 8 - 23 mg/dL   Creatinine, Ser 6.041.26 (H) 0.61 - 1.24 mg/dL   Calcium 8.1 (L) 8.9 - 10.3 mg/dL   GFR, Estimated >54>60 >09>60 mL/min    Comment: (NOTE) Calculated using the CKD-EPI Creatinine Equation (2021)    Anion gap 7 5 - 15    Comment: Performed at [facility], [address] [place], [place] [address]  Glucose, capillary     Status: Abnormal   Collection Time: 04/10/20  6:00 AM  Result Value Ref Range   Glucose-Capillary 103 (H) 70 - 99 mg/dL    Comment: Glucose reference range applies only to samples taken after fasting for at least 8 hours.   Comment 1 Notify RN    Comment 2 Document in Chart    DG Elbow 2 Views Right  Result Date: 04/09/2020 CLINICAL DATA:  Right elbow pain. EXAM: RIGHT ELBOW - 2 VIEW COMPARISON:  None FINDINGS: Advanced elbow joint degenerative changes with joint space narrowing and osteophytic spurring. No acute bony findings or osteochondral lesion. Could not exclude the possibility of the ossified loose body in the joint no obvious joint effusion. IMPRESSION: Advanced elbow joint degenerative changes. No acute bony findings or joint effusion. Electronically Signed   By: Rudie MeyerP.  Gallerani M.D.   On: 04/09/2020 14:27   DG CHEST PORT 1 VIEW  Result Date: 04/08/2020 CLINICAL DATA:  Stroke EXAM:  PORTABLE CHEST 1 VIEW COMPARISON:  None. FINDINGS: The heart size and mediastinal contours are within normal limits. Prominence of the central pulmonary vasculature is seen. The visualized skeletal structures are unremarkable. IMPRESSION: Mild pulmonary vascular congestion.  For Electronically Signed   By: Jonna ClarkBindu  Avutu M.D.   On: 04/08/2020 15:03   ECHOCARDIOGRAM COMPLETE  Result Date: 04/08/2020    ECHOCARDIOGRAM REPORT   [facility]  Date of Exam: 04/08/2020 Medical Rec #:  [id number]  Height:       59.0 in Accession #:    [id number] Weight:       114.5 lb Date of Birth:  1960/03/15  BSA:          1.455 m Patient Age:    60 years   BP:           126/94 mmHg Patient Gender: M          HR:           117 bpm. Exam Location:  Inpatient Procedure: 2D Echo, Cardiac Doppler and Color Doppler Indications:    Stroke I63.9  History:        Patient has no prior history of Echocardiogram examinations.                 CAD; Risk Factors:Hypertension and Diabetes.  Sonographer:  Eulah Pont RDCS Referring Phys: [phone] MCNEILL P KIRKPATRICK IMPRESSIONS  1. Left ventricular ejection fraction, by estimation, is 65 to 70%. The left ventricle has normal function. The left ventricle has no regional wall motion abnormalities. Left ventricular diastolic parameters were normal.  2. Right ventricular systolic function is normal. The right ventricular size is normal. There is mildly elevated pulmonary artery systolic pressure.  3. The mitral valve is normal in structure. Trivial mitral valve regurgitation. No evidence of mitral stenosis.  4. The aortic valve is normal in structure. Aortic valve regurgitation is not visualized. No aortic stenosis is present.  5. The inferior vena cava is normal in size with greater than 50% respiratory variability, suggesting right atrial pressure of 3 mmHg. FINDINGS  Left Ventricle: Left ventricular ejection fraction, by estimation, is 65 to 70%. The left ventricle has normal function. The  left ventricle has no regional wall motion abnormalities. The left ventricular internal cavity size was normal in size. There is  no left ventricular hypertrophy. Left ventricular diastolic parameters were normal. Right Ventricle: The right ventricular size is normal. No increase in right ventricular wall thickness. Right ventricular systolic function is normal. There is mildly elevated pulmonary artery systolic pressure. The tricuspid regurgitant velocity is 2.93  m/s, and with an assumed right atrial pressure of 3 mmHg, the estimated right ventricular systolic pressure is 37.3 mmHg. Left Atrium: Left atrial size was normal in size. Right Atrium: Right atrial size was normal in size. Pericardium: There is no evidence of pericardial effusion. Mitral Valve: The mitral valve is normal in structure. Trivial mitral valve regurgitation. No evidence of mitral valve stenosis. Tricuspid Valve: The tricuspid valve is normal in structure. Tricuspid valve regurgitation is trivial. No evidence of tricuspid stenosis. Aortic Valve: The aortic valve is normal in structure. Aortic valve regurgitation is not visualized. No aortic stenosis is present. Pulmonic Valve: The pulmonic valve was normal in structure. Pulmonic valve regurgitation is not visualized. No evidence of pulmonic stenosis. Aorta: The aortic root is normal in size and structure. Venous: The inferior vena cava is normal in size with greater than 50% respiratory variability, suggesting right atrial pressure of 3 mmHg. IAS/Shunts: The interatrial septum was not well visualized.  LEFT VENTRICLE PLAX 2D LVIDd:         4.30 cm  Diastology LVIDs:         2.60 cm  LV e' medial:    8.16 cm/s LV PW:         0.90 cm  LV E/e' medial:  10.8 LV IVS:        0.90 cm  LV e' lateral:   10.60 cm/s LVOT diam:     1.90 cm  LV E/e' lateral: 8.3 LV SV:         54 LV SV Index:   37 LVOT Area:     2.84 cm  RIGHT VENTRICLE RV S prime:     17.20 cm/s TAPSE (M-mode): 2.3 cm LEFT ATRIUM              Index       RIGHT ATRIUM           Index LA diam:        2.90 cm 1.99 cm/m  RA Area:     10.60 cm LA Vol (A2C):   20.5 ml 14.09 ml/m RA Volume:   22.20 ml  15.26 ml/m LA Vol (A4C):   22.0 ml 15.12 ml/m LA Biplane Vol: 22.6 ml 15.53 ml/m  AORTIC VALVE LVOT Vmax:   115.00 cm/s LVOT Vmean:  82.000 cm/s LVOT VTI:    0.189 m  AORTA Ao Root diam: 3.10 cm Ao Asc diam:  3.00 cm MITRAL VALVE                TRICUSPID VALVE MV Area (PHT): 3.60 cm     TR Peak grad:   34.3 mmHg MV Decel Time: 211 msec     TR Vmax:        293.00 cm/s MV E velocity: 88.30 cm/s MV A velocity: 100.00 cm/s  SHUNTS MV E/A ratio:  0.88         Systemic VTI:  0.19 m                             Systemic Diam: 1.90 cm Charlton Haws MD Electronically signed by Charlton Haws MD Signature Date/Time: 04/08/2020/10:08:03 AM    Final    CT HEMATURIA WORKUP  Result Date: 04/08/2020 CLINICAL DATA:  60 year old male with hematuria. EXAM: CT ABDOMEN AND PELVIS WITHOUT AND WITH CONTRAST TECHNIQUE: Multidetector CT imaging of the abdomen and pelvis was performed following the standard protocol before and following the bolus administration of intravenous contrast. CONTRAST:  OMNIPAQUE IOHEXOL 300 MG/ML  SOLN COMPARISON:  CT abdomen pelvis dated 09/17/2016. FINDINGS: Evaluation of this exam is limited due to motion artifact as well as streak artifact caused by patient's arms. Lower chest: Mild interstitial coarsening and right lung base atelectasis. The visualized lung bases are otherwise clear. No intra-abdominal free air or free fluid. Hepatobiliary: The liver is unremarkable. No intrahepatic biliary ductal dilatation. The gallbladder is unremarkable. Pancreas: Unremarkable. No pancreatic ductal dilatation or surrounding inflammatory changes. Spleen: Normal in size without focal abnormality. Adrenals/Urinary Tract: The adrenal glands unremarkable. There is a small stone or 2 adjacent stones with combined length of approximately 5 mm in the distal left  ureter additional punctate focus may be present in the distal left ureter more distally. There is moderate left hydroureter with severe left hydronephrosis. There is severe left renal parenchyma atrophy and cortical thinning secondary to chronic hydronephrosis. Two punctate nonobstructing left renal inferior pole calculi noted. No obvious enhancing lesion identified. Evaluation however is very limited due to respiratory motion artifact. There is a linear band through the proximal left ureter with focal luminal narrowing likely representing adhesion or scarring. This can be better evaluated with direct visualization and scoping. The right kidney is unremarkable. Thin linear band noted in the proximal right ureter (coronal 39/16 and axial 39/13) likely adhesion or scarring. The urinary bladder appears unremarkable. Stomach/Bowel: There is moderate stool throughout the colon. There is no bowel obstruction or active inflammation. The appendix is normal. Vascular/Lymphatic: Moderate aortoiliac atherosclerotic disease. The IVC is unremarkable. No portal venous gas. There is no adenopathy. Reproductive: The prostate and seminal vesicles are grossly unremarkable. No pelvic mass. Other: None Musculoskeletal: Degenerative changes of the spine. No acute osseous pathology. IMPRESSION: 1. Several tiny stones in the distal left ureter. There is moderate left hydroureter and severe left hydronephrosis. 2. Severe left renal parenchyma atrophy and cortical thinning secondary to chronic hydronephrosis. 3. Linear band through the proximal left ureter with focal luminal narrowing likely representing adhesion or scarring. This can be better evaluated with direct visualization and scoping. 4. Two punctate nonobstructing left renal inferior pole calculi. 5. No bowel obstruction. Normal appendix. 6. Aortic Atherosclerosis (ICD10-I70.0). Electronically Signed   By: Elgie Collard  M.D.   On: 04/08/2020 23:05       Medical Problem  List and Plan: 1.  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  Antithrombotics: -DVT/anticoagulation: Subcutaneous heparin initiated for 02/25/2020  -antiplatelet therapy: N/A 3. Pain Management: Tylenol as needed 4. Mood: Provide emotional support  -antipsychotic agents: N/A 5. Neuropsych: This patient is capable of making decisions on his own behalf. 6. Skin/Wound Care: Routine skin checks 7. Fluids/Electrolytes/Nutrition: Routine in and outs with follow-up chemistries in am. 8.  Hypertension.  Norvasc 10 mg daily, clonidine 0.1 mg twice daily, Lopressor 50 mg twice daily.    -monitor for patterns as he begins to mobilize with therapy 9.  History of open Uretlithotomy 2005 in [place] as well as left PCNL and laser ureterotomy October 2018 followed by Dr. Marlou Porch.  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   -CONTINUE FOLEY TUBE. DO NOT REMOVE . Foley catheter to remain in place until follow-up in the office of Dr. Modena Slater for cystogram. 10.  Diet controlled diabetes mellitus.  Hemoglobin A1c 5.8.  SSI  -Educate pt/family as needed    Charlton Amor, PA-C 04/10/2020

## 2020-04-11 ENCOUNTER — Other Ambulatory Visit: Payer: Self-pay

## 2020-04-11 ENCOUNTER — Inpatient Hospital Stay (HOSPITAL_COMMUNITY)
Admission: RE | Admit: 2020-04-11 | Discharge: 2020-05-17 | DRG: 056 | Disposition: A | Discharge status: Home Health | Payer: BC Managed Care – PPO | Source: Intra-hospital | Attending: Physical Medicine & Rehabilitation | Admitting: Physical Medicine & Rehabilitation

## 2020-04-11 ENCOUNTER — Encounter (HOSPITAL_COMMUNITY): Payer: Self-pay | Admitting: Physical Medicine & Rehabilitation

## 2020-04-11 DIAGNOSIS — G936 Cerebral edema: Secondary | ICD-10-CM | POA: Diagnosis present

## 2020-04-11 DIAGNOSIS — M545 Low back pain, unspecified: Secondary | ICD-10-CM | POA: Diagnosis present

## 2020-04-11 DIAGNOSIS — I69154 Hemiplegia and hemiparesis following nontraumatic intracerebral hemorrhage affecting left non-dominant side: Secondary | ICD-10-CM | POA: Diagnosis present

## 2020-04-11 DIAGNOSIS — E1165 Type 2 diabetes mellitus with hyperglycemia: Secondary | ICD-10-CM | POA: Diagnosis not present

## 2020-04-11 DIAGNOSIS — R52 Pain, unspecified: Secondary | ICD-10-CM

## 2020-04-11 DIAGNOSIS — D72829 Elevated white blood cell count, unspecified: Secondary | ICD-10-CM

## 2020-04-11 DIAGNOSIS — M25461 Effusion, right knee: Secondary | ICD-10-CM | POA: Diagnosis present

## 2020-04-11 DIAGNOSIS — I129 Hypertensive chronic kidney disease with stage 1 through stage 4 chronic kidney disease, or unspecified chronic kidney disease: Secondary | ICD-10-CM | POA: Diagnosis present

## 2020-04-11 DIAGNOSIS — I69190 Apraxia following nontraumatic intracerebral hemorrhage: Secondary | ICD-10-CM

## 2020-04-11 DIAGNOSIS — M25362 Other instability, left knee: Secondary | ICD-10-CM | POA: Diagnosis present

## 2020-04-11 DIAGNOSIS — M1711 Unilateral primary osteoarthritis, right knee: Secondary | ICD-10-CM | POA: Diagnosis present

## 2020-04-11 DIAGNOSIS — N182 Chronic kidney disease, stage 2 (mild): Secondary | ICD-10-CM | POA: Diagnosis present

## 2020-04-11 DIAGNOSIS — Z955 Presence of coronary angioplasty implant and graft: Secondary | ICD-10-CM | POA: Diagnosis not present

## 2020-04-11 DIAGNOSIS — R1110 Vomiting, unspecified: Secondary | ICD-10-CM | POA: Diagnosis not present

## 2020-04-11 DIAGNOSIS — Z87891 Personal history of nicotine dependence: Secondary | ICD-10-CM

## 2020-04-11 DIAGNOSIS — K5901 Slow transit constipation: Secondary | ICD-10-CM

## 2020-04-11 DIAGNOSIS — N136 Pyonephrosis: Secondary | ICD-10-CM | POA: Diagnosis present

## 2020-04-11 DIAGNOSIS — M1A0610 Idiopathic chronic gout, right knee, without tophus (tophi): Secondary | ICD-10-CM

## 2020-04-11 DIAGNOSIS — N179 Acute kidney failure, unspecified: Secondary | ICD-10-CM | POA: Diagnosis present

## 2020-04-11 DIAGNOSIS — D62 Acute posthemorrhagic anemia: Secondary | ICD-10-CM

## 2020-04-11 DIAGNOSIS — I69192 Facial weakness following nontraumatic intracerebral hemorrhage: Secondary | ICD-10-CM

## 2020-04-11 DIAGNOSIS — N322 Vesical fistula, not elsewhere classified: Secondary | ICD-10-CM | POA: Diagnosis present

## 2020-04-11 DIAGNOSIS — M1A9XX0 Chronic gout, unspecified, without tophus (tophi): Secondary | ICD-10-CM | POA: Diagnosis present

## 2020-04-11 DIAGNOSIS — I6930 Unspecified sequelae of cerebral infarction: Secondary | ICD-10-CM | POA: Diagnosis present

## 2020-04-11 DIAGNOSIS — I169 Hypertensive crisis, unspecified: Secondary | ICD-10-CM | POA: Diagnosis present

## 2020-04-11 DIAGNOSIS — Z79899 Other long term (current) drug therapy: Secondary | ICD-10-CM

## 2020-04-11 DIAGNOSIS — R509 Fever, unspecified: Secondary | ICD-10-CM | POA: Diagnosis not present

## 2020-04-11 DIAGNOSIS — Z87442 Personal history of urinary calculi: Secondary | ICD-10-CM

## 2020-04-11 DIAGNOSIS — I610 Nontraumatic intracerebral hemorrhage in hemisphere, subcortical: Secondary | ICD-10-CM

## 2020-04-11 DIAGNOSIS — I2510 Atherosclerotic heart disease of native coronary artery without angina pectoris: Secondary | ICD-10-CM | POA: Diagnosis present

## 2020-04-11 DIAGNOSIS — Z5329 Procedure and treatment not carried out because of patient's decision for other reasons: Secondary | ICD-10-CM | POA: Diagnosis not present

## 2020-04-11 DIAGNOSIS — I619 Nontraumatic intracerebral hemorrhage, unspecified: Secondary | ICD-10-CM | POA: Diagnosis present

## 2020-04-11 DIAGNOSIS — N4889 Other specified disorders of penis: Secondary | ICD-10-CM | POA: Diagnosis not present

## 2020-04-11 DIAGNOSIS — E1122 Type 2 diabetes mellitus with diabetic chronic kidney disease: Secondary | ICD-10-CM | POA: Diagnosis present

## 2020-04-11 DIAGNOSIS — I10 Essential (primary) hypertension: Secondary | ICD-10-CM | POA: Diagnosis not present

## 2020-04-11 LAB — CBC
HCT: 36.1 % — ABNORMAL LOW (ref 39.0–52.0)
Hemoglobin: 11.8 g/dL — ABNORMAL LOW (ref 13.0–17.0)
MCH: 22.2 pg — ABNORMAL LOW (ref 26.0–34.0)
MCHC: 32.7 g/dL (ref 30.0–36.0)
MCV: 67.9 fL — ABNORMAL LOW (ref 80.0–100.0)
Platelets: 271 10*3/uL (ref 150–400)
RBC: 5.32 MIL/uL (ref 4.22–5.81)
RDW: 14.9 % (ref 11.5–15.5)
WBC: 11.3 10*3/uL — ABNORMAL HIGH (ref 4.0–10.5)
nRBC: 0 % (ref 0.0–0.2)

## 2020-04-11 LAB — BASIC METABOLIC PANEL
Anion gap: 7 (ref 5–15)
BUN: 25 mg/dL — ABNORMAL HIGH (ref 8–23)
CO2: 20 mmol/L — ABNORMAL LOW (ref 22–32)
Calcium: 8.1 mg/dL — ABNORMAL LOW (ref 8.9–10.3)
Chloride: 109 mmol/L (ref 98–111)
Creatinine, Ser: 1.17 mg/dL (ref 0.61–1.24)
GFR, Estimated: >60 mL/min (ref >60)
Glucose, Bld: 115 mg/dL — ABNORMAL HIGH (ref 70–99)
Potassium: 3.7 mmol/L (ref 3.5–5.1)
Sodium: 136 mmol/L (ref 135–145)

## 2020-04-11 LAB — GLUCOSE, CAPILLARY
Glucose-Capillary: 113 mg/dL — ABNORMAL HIGH (ref 70–99)
Glucose-Capillary: 144 mg/dL — ABNORMAL HIGH (ref 70–99)
Glucose-Capillary: 124 mg/dL — ABNORMAL HIGH (ref 70–99)
Glucose-Capillary: 119 mg/dL — ABNORMAL HIGH (ref 70–99)

## 2020-04-11 LAB — TRIGLYCERIDES: Triglycerides: 130 mg/dL (ref <150)

## 2020-04-11 MED ORDER — PANTOPRAZOLE SODIUM 40 MG PO TBEC: 40.0000 mg | DELAYED_RELEASE_TABLET | Freq: Every day | ORAL | Status: DC

## 2020-04-11 MED ORDER — AMLODIPINE BESYLATE 10 MG PO TABS
10.0000 mg | ORAL_TABLET | Freq: Every day | ORAL | Status: DC
  Administered 2020-04-12 – 2020-05-17 (×36): 10 mg via ORAL
  Filled 2020-04-11 (×36): qty 1

## 2020-04-11 MED ORDER — CLONIDINE HCL 0.1 MG PO TABS
0.1000 mg | ORAL_TABLET | Freq: Two times a day (BID) | ORAL | Status: DC
  Administered 2020-04-11 – 2020-04-16 (×10): 0.1 mg via ORAL
  Filled 2020-04-11 (×10): qty 1

## 2020-04-11 MED ORDER — ATORVASTATIN CALCIUM 40 MG PO TABS: 40.0000 mg | ORAL_TABLET | Freq: Every day | ORAL | 11 refills | Status: DC

## 2020-04-11 MED ORDER — CLONIDINE HCL 0.1 MG PO TABS: 0.1000 mg | ORAL_TABLET | Freq: Two times a day (BID) | ORAL | 11 refills | Status: DC

## 2020-04-11 MED ORDER — HEPARIN SODIUM (PORCINE) 5000 UNIT/ML IJ SOLN
5000.0000 [IU] | Freq: Two times a day (BID) | INTRAMUSCULAR | Status: DC
  Administered 2020-04-11 – 2020-05-17 (×72): 5000 [IU] via SUBCUTANEOUS
  Filled 2020-04-11 (×72): qty 1

## 2020-04-11 MED ORDER — ACETAMINOPHEN 160 MG/5ML PO SOLN: 650.0000 mg | ORAL | Status: DC | PRN

## 2020-04-11 MED ORDER — SENNOSIDES-DOCUSATE SODIUM 8.6-50 MG PO TABS
1.0000 | ORAL_TABLET | Freq: Two times a day (BID) | ORAL | Status: DC
  Administered 2020-04-11 – 2020-05-17 (×69): 1 via ORAL
  Filled 2020-04-11 (×71): qty 1

## 2020-04-11 MED ORDER — PANTOPRAZOLE SODIUM 40 MG PO TBEC
40.0000 mg | DELAYED_RELEASE_TABLET | Freq: Every day | ORAL | Status: DC
  Administered 2020-04-12 – 2020-05-17 (×36): 40 mg via ORAL
  Filled 2020-04-11 (×36): qty 1

## 2020-04-11 MED ORDER — HEPARIN SODIUM (PORCINE) 5000 UNIT/ML IJ SOLN: 5000.0000 [IU] | Freq: Two times a day (BID) | INTRAMUSCULAR | Status: DC

## 2020-04-11 MED ORDER — METOPROLOL TARTRATE 50 MG PO TABS
50.0000 mg | ORAL_TABLET | Freq: Two times a day (BID) | ORAL | Status: DC
  Administered 2020-04-11 – 2020-05-17 (×70): 50 mg via ORAL
  Filled 2020-04-11 (×72): qty 1

## 2020-04-11 MED ORDER — METOPROLOL TARTRATE 50 MG PO TABS: 50.0000 mg | ORAL_TABLET | Freq: Two times a day (BID) | ORAL | Status: DC

## 2020-04-11 MED ORDER — ACETAMINOPHEN 325 MG PO TABS
650.0000 mg | ORAL_TABLET | ORAL | Status: DC | PRN
  Administered 2020-04-11 – 2020-05-16 (×14): 650 mg via ORAL
  Filled 2020-04-11 (×15): qty 2

## 2020-04-11 MED ORDER — INSULIN ASPART 100 UNIT/ML ~~LOC~~ SOLN
0.0000 [IU] | Freq: Three times a day (TID) | SUBCUTANEOUS | Status: DC
  Administered 2020-04-13: 1 [IU] via SUBCUTANEOUS

## 2020-04-11 MED ORDER — ACETAMINOPHEN 650 MG RE SUPP: 650.0000 mg | RECTAL | Status: DC | PRN

## 2020-04-11 MED ORDER — AMLODIPINE BESYLATE 10 MG PO TABS: 10.0000 mg | ORAL_TABLET | Freq: Every day | ORAL | Status: DC

## 2020-04-11 NOTE — Discharge Summary (Signed)
PATIENT DETAILS Name: Anthony Huber Age: 60 y.o. Sex: male Date of Birth: Mar 09, 1960 MRN: [id number]. Admitting Physician: Rejeana BrockMcNeill P Kirkpatrick, MD WUJ:[id number]:Patient, No Pcp Per (Inactive)  Admit Date: 04/06/2020 Discharge date: 04/11/2020  Recommendations for Outpatient Follow-up:  1. Follow up with PCP in 1-2 weeks 2. Please obtain CMP/CBC in one week 3. Please ensure follow-up at stroke clinic 4. Please ensure follow-up with urology 5. Keep Foley in place-until seen by urology (patient has vesicocutaneous fistula)  Admitted From:  Home  Disposition: CIR    Home Health: No  Equipment/Devices: None  Discharge Condition: Stable  CODE STATUS: FULL CODE  Diet recommendation:  Diet Order            Diet - low sodium heart healthy           Diet Carb Modified Fluid consistency: Thin; Room service appropriate? Yes with Assist  Diet effective now                 Brief Narrative: Patient is a 60 y.o. male with history of HTN, DM, CAD, urolithiasis/left ureteral stricture-s/p open ureterolithotomy-left percutaneous nephrostomy/laser iridotomy in 2018-presented with left sided weakness with a subcortical right-sided hemorrhage.  Significant events: 4/2>> admit to ICU by neurology for ICH.  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 4/4>> Echo: EF 65-70% 4/4>> CT abdomen/pelvis: Tiny stones in distal left ureter, moderate left ureter with severe left hydronephrosis.  Antimicrobial therapy: None  Microbiology data: None  Procedures : None  Consults: Neurology, urology  Brief Hospital Course: Right basal ganglia ICH: Continues to have left-sided  hemiparesis-work-up as above-PT/OT following-possible CIR when ready for discharge.  Hypertensive emergency: Required Cleviprex infusion on admission-BP now stable.  HTN: BP stable-continue amlodipine, metoprolol and clonidine.  SBP goal less than 160.  HLD:  Start statin on discharge.  Vesicocutaneous fistula: Urology following with recommendations to keep Foley catheter in place for at least 2 to 4 weeks  Left hydronephrosis: Discussed with urology-Dr. Andree CossHerrick-on 4/6-chronic issue-does not require any further intervention.  Leukocytosis: Probably due to stress margination-downtrending-no indication of any infection.    Follow periodically.  Mild AKI: Volume status stable-stable for continued close follow-up.  History of CAD: No anginal symptoms-unable to start antiplatelets due to ICH.   Discharge Diagnoses:  Active Problems:   ICH (intracerebral hemorrhage) (HCC)   Discharge Instructions:  Activity:  As tolerated with Full fall precautions use walker/cane & assistance as needed   Discharge Instructions    Ambulatory referral to Neurology   Complete by: As directed    Follow up with stroke clinic NP (Jessica Vanschaick or Darrol Angelarolyn Martin, if both not available, consider Manson AllanSethi, Penumali, or Ahern) [facility] in about 4 weeks. Thanks.   Call MD for:  extreme fatigue   Complete by: As directed    Call MD for:  persistant dizziness or light-headedness   Complete by: As directed    Diet - low sodium heart healthy   Complete by: As directed    Discharge instructions   Complete by: As directed    Follow with Primary MD in 1-2 weeks.  Please ensure follow-up with urology regarding Foley catheter.  Please get a complete blood count and chemistry panel checked by your  Primary MD at your next visit, and again as instructed by your Primary MD.  Get Medicines reviewed and adjusted: Please take all your medications with you for your next visit with your Primary  MD  Laboratory/radiological data: Please request your Primary MD to go over all hospital tests and procedure/radiological results at the follow up, please ask your Primary MD to get all Hospital records sent to his/her office.  In some cases, they will be blood work, cultures and biopsy results pending at the time of your discharge. Please request that your primary care M.D. follows up on these results.  Also Note the following: If you experience worsening of your admission symptoms, develop shortness of breath, life threatening emergency, suicidal or homicidal thoughts you must seek medical attention immediately by calling 911 or calling your MD immediately  if symptoms less severe.  You must read complete instructions/literature along with all the possible adverse reactions/side effects for all the Medicines you take and that have been prescribed to you. Take any new Medicines after you have completely understood and accpet all the possible adverse reactions/side effects.   Do not drive when taking Pain medications or sleeping medications (Benzodaizepines)  Do not take more than prescribed Pain, Sleep and Anxiety Medications. It is not advisable to combine anxiety,sleep and pain medications without talking with your primary care practitioner  Special Instructions: If you have smoked or chewed Tobacco  in the last 2 yrs please stop smoking, stop any regular Alcohol  and or any Recreational drug use.  Wear Seat belts while driving.  Please note: You were cared for by a hospitalist during your hospital stay. Once you are discharged, your primary care physician will handle any further medical issues. Please note that NO REFILLS for any discharge medications will be authorized once you are discharged, as it is imperative that you return to your primary care physician (or establish a relationship with a primary care physician if you do not have one) for your post hospital discharge needs so that  they can reassess your need for medications and monitor your lab values.   Increase activity slowly   Complete by: As directed      Allergies as of 04/11/2020   No Known Allergies     Medication List    STOP taking these medications   DAYQUIL PO   ibuprofen 200 MG tablet Commonly known as: ADVIL   naproxen 500 MG tablet Commonly known as: NAPROSYN   NYQUIL PO   omeprazole 20 MG capsule Commonly known as: PRILOSEC   traMADol 50 MG tablet Commonly known as: Ultram     TAKE these medications   amLODipine 10 MG tablet Commonly known as: NORVASC Take 1 tablet (10 mg total) by mouth daily. Start taking on: April 12, 2020   atorvastatin 40 MG tablet Commonly known as: Lipitor Take 1 tablet (40 mg total) by mouth daily.   cloNIDine 0.1 MG tablet Commonly known as: CATAPRES Take 1 tablet (0.1 mg total) by mouth 2 (two) times daily.   metoprolol tartrate 50 MG tablet Commonly known as: LOPRESSOR Take 1 tablet (50 mg total) by mouth 2 (two) times daily.   pantoprazole 40 MG tablet Commonly known as: PROTONIX Take 1 tablet (40 mg total) by mouth daily. Start taking on: April 12, 2020       Follow-up Information    [facility]. Schedule an appointment as soon as possible for a visit in 4 week(s).   Specialty: Neurology Contact information:  [address] [place] [address] [phone]       Primary care MD. Schedule an appointment as soon as possible for a visit in 1 week(s).              No Known Allergies     Other Procedures/Studies: DG Elbow 2 Views Right  Result Date: 04/09/2020 CLINICAL DATA:  Right elbow pain. EXAM: RIGHT ELBOW - 2 VIEW COMPARISON:  None FINDINGS: Advanced elbow joint degenerative changes with joint space narrowing and osteophytic spurring. No acute bony findings or osteochondral lesion. Could not exclude the possibility of the ossified loose body in the joint no obvious joint effusion.  IMPRESSION: Advanced elbow joint degenerative changes. No acute bony findings or joint effusion. Electronically Signed   By: Rudie Meyer M.D.   On: 04/09/2020 14:27   CT HEAD WO CONTRAST  Result Date: 04/07/2020 CLINICAL DATA:  60 year old male code stroke presentation yesterday with acute right basal ganglia hemorrhage. Subsequent encounter. EXAM: CT HEAD WITHOUT CONTRAST TECHNIQUE: Contiguous axial images were obtained from the base of the skull through the vertex without intravenous contrast. COMPARISON:  Head CT 2312 hours yesterday. FINDINGS: Brain: Oval hyperdense hemorrhage centered at the posterior right lentiform encompasses 54 x 21 x 39 mm (AP by transverse by CC) for an estimated intra-axial blood volume of 22 mL but not significantly changed when measured in the same way last night (52 by 24 by 36 mm last night). Surrounding edema has not significantly changed. No extra-axial or intraventricular extension has developed. There is mass effect on the right lateral ventricle with mild leftward midline shift of 2-3 mm, stable. Basilar cisterns remain normal. Stable gray-white matter differentiation elsewhere. Vascular: No suspicious intracranial vascular hyperdensity. Skull: Poor left maxillary posterior dentition. No acute osseous abnormality identified. Sinuses/Orbits: Visualized paranasal sinuses and mastoids are stable and well pneumatized. Other: Visualized orbits and scalp soft tissues are within normal limits. IMPRESSION: 1. Acute right basal ganglia hemorrhage not significantly changed since last night, estimated blood volume 22 mL. No intraventricular or extra-axial extension. Stable surrounding edema and mild regional mass effect. 2. No new intracranial abnormality. Electronically Signed   By: Odessa Fleming M.D.   On: 04/07/2020 11:31   MR ANGIO HEAD WO CONTRAST  Result Date: 04/08/2020 CLINICAL DATA:  Follow-up examination for acute intracranial hemorrhage. EXAM: MRI HEAD WITHOUT CONTRAST MRA  HEAD WITHOUT CONTRAST TECHNIQUE: Multiplanar, multiecho pulse sequences of the brain and surrounding structures were obtained without intravenous contrast. Angiographic images of the head were obtained using MRA technique without contrast. COMPARISON:  Prior CTs from 04/07/2020 and 04/06/2020 FINDINGS: MRI HEAD FINDINGS Brain: Examination mildly degraded by motion artifact. Cerebral volume within normal limits for age. Patchy T2/FLAIR hyperintensity within the periventricular and deep white matter both cerebral hemispheres most consistent with chronic small vessel ischemic disease. Previously identified intraparenchymal hemorrhage centered at the right basal ganglia again seen, not significantly changed in size and morphology measuring 4.9 x 1.9 x 3.5 cm (estimated volume 18 mL). Similar surrounding vasogenic edema with regional mass effect. Partial effacement of the right lateral ventricle, similar. Associated right-to-left shift of up to approximately 5 mm also relatively unchanged. No intraventricular extension. Basilar cisterns remain patent. No appreciable underlying lesion on this noncontrast examination. No other evidence for acute or chronic intracranial hemorrhage. No other diffusion abnormality to suggest acute or subacute ischemia. Gray-white matter differentiation otherwise maintained. No encephalomalacia to suggest chronic cortical infarction elsewhere within the brain. No mass lesion or extra-axial fluid  collection. No hydrocephalus or ventricular trapping. No made of a partially empty sella. Midline structures intact. Vascular: Major intracranial vascular flow voids are maintained. Skull and upper cervical spine: Craniocervical junction within normal limits. Bone marrow signal intensity normal. No scalp soft tissue abnormality. Sinuses/Orbits: Globes and orbital soft tissues within normal limits. Mild scattered mucosal thickening noted within the ethmoidal air cells. Paranasal sinuses are otherwise  largely clear. No significant mastoid effusion. Inner ear structures grossly normal. Other: None. MRA HEAD FINDINGS ANTERIOR CIRCULATION: Visualized distal cervical segments of the internal carotid arteries are mildly tortuous but patent with antegrade flow. Petrous, cavernous, and supraclinoid segments widely patent without stenosis or other abnormality. A1 segments widely patent. Normal anterior communicating artery complex. Anterior cerebral arteries widely patent to their distal aspects without stenosis. No M1 stenosis or occlusion. Normal MCA bifurcations. Distal MCA branches well perfused and symmetric. POSTERIOR CIRCULATION: Dominant right V4 segment widely patent to the vertebrobasilar junction. Left vertebral artery hypoplastic and terminates in PICA. Both PICA origins patent and normal. Focal moderate stenosis noted involving the mid basilar artery (series 1154, image 8). Basilar otherwise patent to its distal aspect. Superior cerebellar arteries patent bilaterally. Left PCA supplied via the basilar as well as a prominent left posterior communicating artery. Fetal type origin of the right PCA. Both PCAs well perfused to their distal aspects without stenosis. No intracranial aneurysm. No other vascular abnormality seen underlying the acute right basal ganglia hemorrhage. IMPRESSION: MRI HEAD IMPRESSION: 1. Stable size and morphology of right basal ganglia intraparenchymal hemorrhage with similar surrounding vasogenic edema and regional mass effect. Associated 5 mm right-to-left shift, stable. No intraventricular extension, hydrocephalus, or ventricular trapping. No appreciable underlying lesion on this noncontrast examination. 2. Underlying mild chronic microvascular ischemic disease. MRA HEAD IMPRESSION: 1. Negative intracranial MRA. No intracranial aneurysm or other vascular abnormality seen underlying the right basal ganglia hemorrhage. No large vessel occlusion. 2. Focal moderate stenosis involving the  mid basilar artery. No other hemodynamically significant stenosis. 3. 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 Fetal type origin of the right PCA. Electronically Signed   By: Rise Mu M.D.   On: 04/08/2020 03:19   MR BRAIN WO CONTRAST  Result Date: 04/08/2020 CLINICAL DATA:  Follow-up examination for acute intracranial hemorrhage. EXAM: MRI HEAD WITHOUT CONTRAST MRA HEAD WITHOUT CONTRAST TECHNIQUE: Multiplanar, multiecho pulse sequences of the brain and surrounding structures were obtained without intravenous contrast. Angiographic images of the head were obtained using MRA technique without contrast. COMPARISON:  Prior CTs from 04/07/2020 and 04/06/2020 FINDINGS: MRI HEAD FINDINGS Brain: Examination mildly degraded by motion artifact. Cerebral volume within normal limits for age. Patchy T2/FLAIR hyperintensity within the periventricular and deep white matter both cerebral hemispheres most consistent with chronic small vessel ischemic disease. Previously identified intraparenchymal hemorrhage centered at the right basal ganglia again seen, not significantly changed in size and morphology measuring 4.9 x 1.9 x 3.5 cm (estimated volume 18 mL). Similar surrounding vasogenic edema with regional mass effect. Partial effacement of the right lateral ventricle, similar. Associated right-to-left shift of up to approximately 5 mm also relatively unchanged. No intraventricular extension. Basilar cisterns remain patent. No appreciable underlying lesion on this noncontrast examination. No other evidence for acute or chronic intracranial hemorrhage. No other diffusion abnormality to suggest acute or subacute ischemia. Gray-white matter differentiation otherwise maintained. No encephalomalacia to suggest chronic cortical infarction elsewhere within the brain. No mass lesion or extra-axial fluid collection. No hydrocephalus or ventricular trapping. No made of a partially empty sella. Midline  structures  intact. Vascular: Major intracranial vascular flow voids are maintained. Skull and upper cervical spine: Craniocervical junction within normal limits. Bone marrow signal intensity normal. No scalp soft tissue abnormality. Sinuses/Orbits: Globes and orbital soft tissues within normal limits. Mild scattered mucosal thickening noted within the ethmoidal air cells. Paranasal sinuses are otherwise largely clear. No significant mastoid effusion. Inner ear structures grossly normal. Other: None. MRA HEAD FINDINGS ANTERIOR CIRCULATION: Visualized distal cervical segments of the internal carotid arteries are mildly tortuous but patent with antegrade flow. Petrous, cavernous, and supraclinoid segments widely patent without stenosis or other abnormality. A1 segments widely patent. Normal anterior communicating artery complex. Anterior cerebral arteries widely patent to their distal aspects without stenosis. No M1 stenosis or occlusion. Normal MCA bifurcations. Distal MCA branches well perfused and symmetric. POSTERIOR CIRCULATION: Dominant right V4 segment widely patent to the vertebrobasilar junction. Left vertebral artery hypoplastic and terminates in PICA. Both PICA origins patent and normal. Focal moderate stenosis noted involving the mid basilar artery (series 1154, image 8). Basilar otherwise patent to its distal aspect. Superior cerebellar arteries patent bilaterally. Left PCA supplied via the basilar as well as a prominent left posterior communicating artery. Fetal type origin of the right PCA. Both PCAs well perfused to their distal aspects without stenosis. No intracranial aneurysm. No other vascular abnormality seen underlying the acute right basal ganglia hemorrhage. IMPRESSION: MRI HEAD IMPRESSION: 1. Stable size and morphology of right basal ganglia intraparenchymal hemorrhage with similar surrounding vasogenic edema and regional mass effect. Associated 5 mm right-to-left shift, stable. No intraventricular  extension, hydrocephalus, or ventricular trapping. No appreciable underlying lesion on this noncontrast examination. 2. Underlying mild chronic microvascular ischemic disease. MRA HEAD IMPRESSION: 1. Negative intracranial MRA. No intracranial aneurysm or other vascular abnormality seen underlying the right basal ganglia hemorrhage. No large vessel occlusion. 2. 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 No other hemodynamically significant stenosis. 3. 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 Fetal type origin of the right PCA. Electronically Signed   By: Rise Mu M.D.   On: 04/08/2020 03:19   DG CHEST PORT 1 VIEW  Result Date: 04/08/2020 CLINICAL DATA:  Stroke EXAM: PORTABLE CHEST 1 VIEW COMPARISON:  None. FINDINGS: The heart size and mediastinal contours are within normal limits. Prominence of the central pulmonary vasculature is seen. The visualized skeletal structures are unremarkable. IMPRESSION: Mild pulmonary vascular congestion.  For Electronically Signed   By: Jonna Clark M.D.   On: 04/08/2020 15:03   ECHOCARDIOGRAM COMPLETE  Result Date: 04/08/2020    ECHOCARDIOGRAM REPORT   Patient Name:   [facility]  Date of Exam: 04/08/2020 Medical Rec #:  [id number]  Height:       59.0 in Accession #:    [id number] Weight:       114.5 lb Date of Birth:  1960-05-14  BSA:          1.455 m Patient Age:    60 years   BP:           126/94 mmHg Patient Gender: M          HR:           117 bpm. Exam Location:  Inpatient Procedure: 2D Echo, Cardiac Doppler and Color Doppler Indications:    Stroke I63.9  History:        Patient has no prior history of Echocardiogram examinations.                 CAD; Risk Factors:Hypertension and  Diabetes.  Sonographer:    Eulah Pont RDCS Referring Phys: [phone] MCNEILL P KIRKPATRICK IMPRESSIONS  1. Left ventricular ejection fraction, by estimation, is 65 to 70%. The left ventricle has normal function. The left ventricle has no regional wall motion  abnormalities. Left ventricular diastolic parameters were normal.  2. Right ventricular systolic function is normal. The right ventricular size is normal. There is mildly elevated pulmonary artery systolic pressure.  3. The mitral valve is normal in structure. Trivial mitral valve regurgitation. No evidence of mitral stenosis.  4. The aortic valve is normal in structure. Aortic valve regurgitation is not visualized. No aortic stenosis is present.  5. The inferior vena cava is normal in size with greater than 50% respiratory variability, suggesting right atrial pressure of 3 mmHg. FINDINGS  Left Ventricle: Left ventricular ejection fraction, by estimation, is 65 to 70%. The left ventricle has normal function. The left ventricle has no regional wall motion abnormalities. The left ventricular internal cavity size was normal in size. There is  no left ventricular hypertrophy. Left ventricular diastolic parameters were normal. Right Ventricle: The right ventricular size is normal. No increase in right ventricular wall thickness. Right ventricular systolic function is normal. There is mildly elevated pulmonary artery systolic pressure. The tricuspid regurgitant velocity is 2.93  m/s, and with an assumed right atrial pressure of 3 mmHg, the estimated right ventricular systolic pressure is 37.3 mmHg. Left Atrium: Left atrial size was normal in size. Right Atrium: Right atrial size was normal in size. Pericardium: There is no evidence of pericardial effusion. Mitral Valve: The mitral valve is normal in structure. Trivial mitral valve regurgitation. No evidence of mitral valve stenosis. Tricuspid Valve: The tricuspid valve is normal in structure. Tricuspid valve regurgitation is trivial. No evidence of tricuspid stenosis. Aortic Valve: The aortic valve is normal in structure. Aortic valve regurgitation is not visualized. No aortic stenosis is present. Pulmonic Valve: The pulmonic valve was normal in structure. Pulmonic valve  regurgitation is not visualized. No evidence of pulmonic stenosis. Aorta: The aortic root is normal in size and structure. Venous: The inferior vena cava is normal in size with greater than 50% respiratory variability, suggesting right atrial pressure of 3 mmHg. IAS/Shunts: The interatrial septum was not well visualized.  LEFT VENTRICLE PLAX 2D LVIDd:         4.30 cm  Diastology LVIDs:         2.60 cm  LV e' medial:    8.16 cm/s LV PW:         0.90 cm  LV E/e' medial:  10.8 LV IVS:        0.90 cm  LV e' lateral:   10.60 cm/s LVOT diam:     1.90 cm  LV E/e' lateral: 8.3 LV SV:         54 LV SV Index:   37 LVOT Area:     2.84 cm  RIGHT VENTRICLE RV S prime:     17.20 cm/s TAPSE (M-mode): 2.3 cm LEFT ATRIUM             Index       RIGHT ATRIUM           Index LA diam:        2.90 cm 1.99 cm/m  RA Area:     10.60 cm LA Vol (A2C):   20.5 ml 14.09 ml/m RA Volume:   22.20 ml  15.26 ml/m LA Vol (A4C):   22.0 ml 15.12 ml/m LA Biplane  Vol: 22.6 ml 15.53 ml/m  AORTIC VALVE LVOT Vmax:   115.00 cm/s LVOT Vmean:  82.000 cm/s LVOT VTI:    0.189 m  AORTA Ao Root diam: 3.10 cm Ao Asc diam:  3.00 cm MITRAL VALVE                TRICUSPID VALVE MV Area (PHT): 3.60 cm     TR Peak grad:   34.3 mmHg MV Decel Time: 211 msec     TR Vmax:        293.00 cm/s MV E velocity: 88.30 cm/s MV A velocity: 100.00 cm/s  SHUNTS MV E/A ratio:  0.88         Systemic VTI:  0.19 m                             Systemic Diam: 1.90 cm Charlton Haws MD Electronically signed by Charlton Haws MD Signature Date/Time: 04/08/2020/10:08:03 AM    Final    CT HEMATURIA WORKUP  Result Date: 04/08/2020 CLINICAL DATA:  60 year old male with hematuria. EXAM: CT ABDOMEN AND PELVIS WITHOUT AND WITH CONTRAST TECHNIQUE: Multidetector CT imaging of the abdomen and pelvis was performed following the standard protocol before and following the bolus administration of intravenous contrast. CONTRAST:  OMNIPAQUE IOHEXOL 300 MG/ML  SOLN COMPARISON:  CT abdomen pelvis  dated 09/17/2016. FINDINGS: Evaluation of this exam is limited due to motion artifact as well as streak artifact caused by patient's arms. Lower chest: Mild interstitial coarsening and right lung base atelectasis. The visualized lung bases are otherwise clear. No intra-abdominal free air or free fluid. Hepatobiliary: The liver is unremarkable. No intrahepatic biliary ductal dilatation. The gallbladder is unremarkable. Pancreas: Unremarkable. No pancreatic ductal dilatation or surrounding inflammatory changes. Spleen: Normal in size without focal abnormality. Adrenals/Urinary Tract: The adrenal glands unremarkable. There is a small stone or 2 adjacent stones with combined length of approximately 5 mm in the distal left ureter additional punctate focus may be present in the distal left ureter more distally. There is moderate left hydroureter with severe left hydronephrosis. There is severe left renal parenchyma atrophy and cortical thinning secondary to chronic hydronephrosis. Two punctate nonobstructing left renal inferior pole calculi noted. No obvious enhancing lesion identified. Evaluation however is very limited due to respiratory motion artifact. There is a linear band through the proximal left ureter with focal luminal narrowing likely representing adhesion or scarring. This can be better evaluated with direct visualization and scoping. The right kidney is unremarkable. Thin linear band noted in the proximal right ureter (coronal 39/16 and axial 39/13) likely adhesion or scarring. The urinary bladder appears unremarkable. Stomach/Bowel: There is moderate stool throughout the colon. There is no bowel obstruction or active inflammation. The appendix is normal. Vascular/Lymphatic: Moderate aortoiliac atherosclerotic disease. The IVC is unremarkable. No portal venous gas. There is no adenopathy. Reproductive: The prostate and seminal vesicles are grossly unremarkable. No pelvic mass. Other: None Musculoskeletal:  Degenerative changes of the spine. No acute osseous pathology. IMPRESSION: 1. Several tiny stones in the distal left ureter. There is moderate left hydroureter and severe left hydronephrosis. 2. Severe left renal parenchyma atrophy and cortical thinning secondary to chronic hydronephrosis. 3. Linear band through the proximal left ureter with focal luminal narrowing likely representing adhesion or scarring. This can be better evaluated with direct visualization and scoping. 4. Two punctate nonobstructing left renal inferior pole calculi. 5. No bowel obstruction. Normal appendix. 6. Aortic Atherosclerosis (ICD10-I70.0). Electronically  Signed   By: Elgie Collard M.D.   On: 04/08/2020 23:05   CT HEAD CODE STROKE WO CONTRAST  Result Date: 04/06/2020 CLINICAL DATA:  Code stroke. Initial evaluation for neuro deficit, stroke suspected. EXAM: CT HEAD WITHOUT CONTRAST TECHNIQUE: Contiguous axial images were obtained from the base of the skull through the vertex without intravenous contrast. COMPARISON:  None. FINDINGS: Brain: Age-related cerebral atrophy with moderate chronic microvascular ischemic disease. 4.3 x 2.4 x 3.4 cm acute intraparenchymal hemorrhage centered at the right lentiform nucleus (series 3, image 18, estimated volume 18 cc). Mild localized edema with regional mass effect. Mild effacement of the right lateral ventricle with up to 5 mm of right-to-left shift. No hydrocephalus or trapping. No visible intraventricular extension. No other acute intracranial hemorrhage or large vessel territory infarct. No mass lesion or extra-axial fluid collection. Vascular: No hyperdense vessel. Skull: Scalp soft tissues and calvarium within normal limits. Sinuses/Orbits: Globes and orbital soft tissues demonstrate no acute finding. Paranasal sinuses are largely clear. No mastoid effusion. Other: None. ASPECTS [facility] Stroke Program Early CT Score) Does not apply. IMPRESSION: 1. 4.3 x 2.4 x 3.4 cm acute intraparenchymal  hemorrhage centered at the right basal ganglia, estimated volume 18 cc. Localized mass effect with up to 5 mm of right-to-left shift. 2. Underlying moderate chronic microvascular ischemic disease. These results were communicated to Dr. Amada Jupiter at 11:24 pmon 4/2/2022by text page via the [facility] messaging system. Electronically Signed   By: Rise Mu M.D.   On: 04/06/2020 23:27     TODAY-DAY OF DISCHARGE:  Subjective:   Anthony Huber today has no headache,no chest abdominal pain,no new weakness tingling or numbness, feels much better wants to go home today.   Objective:   Blood pressure 133/76, pulse 79, temperature 98.2 F (36.8 C), temperature source Oral, resp. rate 16, height  (1.499 m), weight 51.3 kg, SpO2 98 %.  Intake/Output Summary (Last 24 hours) at 04/11/2020 1010 Last data filed at 04/11/2020 0606 Gross per 24 hour  Intake --  Output 1200 ml  Net -1200 ml   Filed Weights   04/06/20 2300 04/09/20 2333  Weight: 51.9 kg 51.3 kg    Exam: Awake Alert, Oriented *3, No new F.N deficits, Normal affect [place].AT,PERRAL Supple Neck,No JVD, No cervical lymphadenopathy appriciated.  Symmetrical Chest wall movement, Good air movement bilaterally, CTAB RRR,No Gallops,Rubs or new Murmurs, No Parasternal Heave +ve B.Sounds, Abd Soft, Non tender, No organomegaly appriciated, No rebound -guarding or rigidity. No Cyanosis, Clubbing or edema, No new Rash or bruise   PERTINENT RADIOLOGIC STUDIES: DG Elbow 2 Views Right  Result Date: 04/09/2020 CLINICAL DATA:  Right elbow pain. EXAM: RIGHT ELBOW - 2 VIEW COMPARISON:  None FINDINGS: Advanced elbow joint degenerative changes with joint space narrowing and osteophytic spurring. No acute bony findings or osteochondral lesion. Could not exclude the possibility of the ossified loose body in the joint no obvious joint effusion. IMPRESSION: Advanced elbow joint degenerative changes. No acute bony findings or joint effusion. Electronically  Signed   By: Rudie Meyer M.D.   On: 04/09/2020 14:27     PERTINENT LAB RESULTS: CBC: Recent Labs    04/10/20 0509 04/11/20 0418  WBC 12.6* 11.3*  HGB 11.9* 11.8*  HCT 35.8* 36.1*  PLT 258 271   CMET CMP     Component Value Date/Time   NA 136 04/11/2020 0418   K 3.7 04/11/2020 0418   CL 109 04/11/2020 0418   CO2 20 (L) 04/11/2020 0418   GLUCOSE  115 (H) 04/11/2020 0418   BUN 25 (H) 04/11/2020 0418   CREATININE 1.17 04/11/2020 0418   CALCIUM 8.1 (L) 04/11/2020 0418   PROT 7.5 04/06/2020 2350   ALBUMIN 3.4 (L) 04/06/2020 2350   AST 23 04/06/2020 2350   ALT 9 04/06/2020 2350   ALKPHOS 76 04/06/2020 2350   BILITOT 0.5 04/06/2020 2350   GFRNONAA >60 04/11/2020 0418   GFRAA 57 (L) 09/17/2016 0531    GFR Estimated Creatinine Clearance: 44.7 mL/min (by C-G formula based on SCr of 1.17 mg/dL). No results for input(s): LIPASE, AMYLASE in the last 72 hours. No results for input(s): CKTOTAL, CKMB, CKMBINDEX, TROPONINI in the last 72 hours. Invalid input(s): POCBNP No results for input(s): DDIMER in the last 72 hours. No results for input(s): HGBA1C in the last 72 hours. Recent Labs    04/11/20 0418  TRIG 130   No results for input(s): TSH, T4TOTAL, T3FREE, THYROIDAB in the last 72 hours.  Invalid input(s): FREET3 No results for input(s): VITAMINB12, FOLATE, FERRITIN, TIBC, IRON, RETICCTPCT in the last 72 hours. Coags: No results for input(s): INR in the last 72 hours.  Invalid input(s): PT Microbiology: Recent Results (from the past 240 hour(s))  Resp Panel by RT-PCR (Flu A&B, Covid) Nasopharyngeal Swab     Status: None   Collection Time: 04/07/20 12:20 AM   Specimen: Nasopharyngeal Swab; Nasopharyngeal(NP) swabs in vial transport medium  Result Value Ref Range Status   SARS Coronavirus 2 by RT PCR NEGATIVE NEGATIVE Final    Comment: (NOTE) SARS-CoV-2 target nucleic acids are NOT DETECTED.  The SARS-CoV-2 RNA is generally detectable in upper  respiratory specimens during the acute phase of infection. The lowest concentration of SARS-CoV-2 viral copies this assay can detect is 138 copies/mL. A negative result does not preclude SARS-Cov-2 infection and should not be used as the sole basis for treatment or other patient management decisions. A negative result may occur with  improper specimen collection/handling, submission of specimen other than nasopharyngeal swab, presence of viral mutation(s) within the areas targeted by this assay, and inadequate number of viral copies(<138 copies/mL). A negative result must be combined with clinical observations, patient history, and epidemiological information. The expected result is Negative.  Fact Sheet for Patients:  BloggerCourse.com  Fact Sheet for Healthcare Providers:  SeriousBroker.it  This test is no t yet approved or cleared by the [place] FDA and  has been authorized for detection and/or diagnosis of SARS-CoV-2 by FDA under an Emergency Use Authorization (EUA). This EUA will remain  in effect (meaning this test can be used) for the duration of the COVID-19 declaration under Section 564(b)(1) of the Act, 21 U.S.C.section 360bbb-3(b)(1), unless the authorization is terminated  or revoked sooner.       Influenza A by PCR NEGATIVE NEGATIVE Final   Influenza B by PCR NEGATIVE NEGATIVE Final    Comment: (NOTE) The Xpert Xpress SARS-CoV-2/FLU/RSV plus assay is intended as an aid in the diagnosis of influenza from Nasopharyngeal swab specimens and should not be used as a sole basis for treatment. Nasal washings and aspirates are unacceptable for Xpert Xpress SARS-CoV-2/FLU/RSV testing.  Fact Sheet for Patients: BloggerCourse.com  Fact Sheet for Healthcare Providers: SeriousBroker.it  This test is not yet approved or cleared by the [place] FDA and has been  authorized for detection and/or diagnosis of SARS-CoV-2 by FDA under an Emergency Use Authorization (EUA). This EUA will remain in effect (meaning this test can be used) for the duration of the COVID-19 declaration  under Section 564(b)(1) of the Act, 21 U.S.C. section 360bbb-3(b)(1), unless the authorization is terminated or revoked.  Performed at [facility], [address] [place], [place] [address]   MRSA PCR Screening     Status: None   Collection Time: 04/07/20  4:30 PM   Specimen: Nasopharyngeal  Result Value Ref Range Status   MRSA by PCR NEGATIVE NEGATIVE Final    Comment:        The GeneXpert MRSA Assay (FDA approved for NASAL specimens only), is one component of a comprehensive MRSA colonization surveillance program. It is not intended to diagnose MRSA infection nor to guide or monitor treatment for MRSA infections. Performed at [facility], [address] [place], [place] [address]     FURTHER DISCHARGE INSTRUCTIONS:  Get Medicines reviewed and adjusted: Please take all your medications with you for your next visit with your Primary MD  Laboratory/radiological data: Please request your Primary MD to go over all hospital tests and procedure/radiological results at the follow up, please ask your Primary MD to get all Hospital records sent to his/her office.  In some cases, they will be blood work, cultures and biopsy results pending at the time of your discharge. Please request that your primary care M.D. goes through all the records of your hospital data and follows up on these results.  Also Note the following: If you experience worsening of your admission symptoms, develop shortness of breath, life threatening emergency, suicidal or homicidal thoughts you must seek medical attention immediately by calling 911 or calling your MD immediately  if symptoms less severe.  You must read complete instructions/literature along with all the possible adverse  reactions/side effects for all the Medicines you take and that have been prescribed to you. Take any new Medicines after you have completely understood and accpet all the possible adverse reactions/side effects.   Do not drive when taking Pain medications or sleeping medications (Benzodaizepines)  Do not take more than prescribed Pain, Sleep and Anxiety Medications. It is not advisable to combine anxiety,sleep and pain medications without talking with your primary care practitioner  Special Instructions: If you have smoked or chewed Tobacco  in the last 2 yrs please stop smoking, stop any regular Alcohol  and or any Recreational drug use.  Wear Seat belts while driving.  Please note: You were cared for by a hospitalist during your hospital stay. Once you are discharged, your primary care physician will handle any further medical issues. Please note that NO REFILLS for any discharge medications will be authorized once you are discharged, as it is imperative that you return to your primary care physician (or establish a relationship with a primary care physician if you do not have one) for your post hospital discharge needs so that they can reassess your need for medications and monitor your lab values.  Total Time spent coordinating discharge including counseling, education and face to face time equals 35 minutes.  Signed: Rigel Filsinger 04/11/2020 10:10 AM

## 2020-04-11 NOTE — H&P (Signed)
Physical Medicine and Rehabilitation Admission H&P        Chief Complaint  Patient presents with  . Code Stroke  : HPI: Anthony Huber is a 60 year old right-handed male with history of open ureterlithotomy 2005 in [place] as well as left PCNL and laser ureterotomy October 2018 per Dr. Marlou Porch, CAD, diabetes mellitus and hypertension with remote tobacco use.  Per chart review lives with spouse independent prior to admission.  1 level home.  He works at a [employer], [employer].  Presented 04/06/2020 with altered mental status headache and left-sided weakness.  Blood pressure 170/100.  Cranial CT scan showed a 4.3 x 2.4 x 3.4 cm acute intraparenchymal hemorrhage centered at the right basal ganglia.  Localized mass-effect with up to 5 mm right to left shift.  Admission chemistries unremarkable except hemoglobin A1c 5.8 WBC 15,700.  MRI/MRA showed stable size and morphology of right basal ganglia intraparenchymal hemorrhage with similar surrounding vasogenic edema and regional mass-effect.  Associated 5 mm right to left shift.  No intraventricular extension hydrocephalus or ventricular trapping.  MRA was unremarkable.  Echocardiogram with ejection fraction of 65 to 70% no wall motion abnormalities.  Maintained on Cleviprex for blood pressure control.  Patient was cleared to begin subcutaneous heparin for DVT prophylaxis 04/06/2020.  Hospital course follow-up urology services 04/08/2020 for evaluation of leaking urine from his incision line which had been intermittent since ureterotomy.  CT abdomen pelvis showed several tiny stones in the distal left ureter.  Moderate left hydroureter and severe left hydronephrosis.  Severe left renal parenchymal atrophy and cortical thinning secondary to chronic hydronephrosis.  Linear band to the proximal left ureter with focal luminal narrowing likely representing adhesion or scarring with 2 punctate nonobstructing left renal inferior pole calculi and fluid cultures were sent and plan  Foley catheter tube insertion.  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  Therapy evaluations completed due to patient's weakness and decreased functional mobility was admitted for a comprehensive rehab program.   Review of Systems  Constitutional: Negative for chills and fever.  HENT: Negative for hearing loss.   Eyes: Negative for blurred vision and double vision.  Respiratory: Negative for cough and shortness of breath.   Cardiovascular: Negative for chest pain, palpitations and leg swelling.  Gastrointestinal: Positive for constipation. Negative for heartburn, nausea and vomiting.  Genitourinary: Positive for urgency. Negative for dysuria, flank pain and hematuria.  Musculoskeletal: Positive for back pain.  Skin: Negative for rash.  Neurological: Positive for weakness.  All other systems reviewed and are negative.       Past Medical History:  Diagnosis Date  . Coronary artery disease    . Diabetes mellitus without complication (HCC)    . Hypertension           Past Surgical History:  Procedure Laterality Date  . BACK SURGERY      . BLADDER SURGERY      . CORONARY ANGIOPLASTY WITH STENT PLACEMENT      . CYSTOSCOPY WITH STENT PLACEMENT Left 09/05/2016    Procedure: CYSTOSCOPY, URETEROSCOPY;  Surgeon: Crist Fat, MD;  Location: [facility];  Service: Urology;  Laterality: Left;  . IR NEPHROSTOMY PLACEMENT LEFT   09/06/2016  . NEPHROLITHOTOMY Left 09/16/2016    Procedure: NEPHROLITHOTOMY PERCUTANEOUS  LEFT;  Surgeon: Crist Fat, MD;  Location: [facility];  Service: Urology;  Laterality: Left;    No family history on file. Social History:  reports that he quit smoking about 6 years ago. He has never used smokeless tobacco. He reports that he does not drink alcohol and does not use drugs. Allergies: No Known Allergies       Medications Prior to  Admission  Medication Sig Dispense Refill  . ibuprofen (ADVIL) 200 MG tablet Take 200 mg by mouth every 6 (six) hours as needed for headache or moderate pain.      . Pseudoeph-Doxylamine-DM-APAP (NYQUIL PO) Take 1 Dose by mouth daily as needed (sore throat/congestion).      . Pseudoephedrine-APAP-DM (DAYQUIL PO) Take 1 Dose by mouth daily as needed (sore throat/congestion).      . naproxen (NAPROSYN) 500 MG tablet Take 500 mg by mouth 2 (two) times daily as needed for pain. (Patient not taking: No sig reported)   0  . omeprazole (PRILOSEC) 20 MG capsule Take 20 mg by mouth 2 (two) times daily. (Patient not taking: No sig reported)   0  . traMADol (ULTRAM) 50 MG tablet Take 1-2 tablets (50-100 mg total) by mouth every 6 (six) hours as needed for moderate pain. 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   Functional Status:  Mobility: Bed Mobility Overal bed mobility: Needs Assistance Bed Mobility: Rolling,Sidelying to Sit Rolling: Mod assist Sidelying to sit: Max assist,+2 for physical assistance Supine to sit: Max assist,+2 for physical assistance General bed mobility comments: Pt rolling to left with cues for reaching with RUE, assist for BLE's off edge of bed and trunk to upright. Transfers Overall transfer level: Needs assistance Equipment used: None Transfers:  Sit to/from [employer] Sit to Stand: Max assist,+2 physical assistance Stand pivot transfers: Max assist,+2 physical assistance Squat pivot transfers: Max assist,+2 physical assistance General transfer comment: MaxA + 2 to stand from edge of bed with left knee block, pivoting towards right from bed to chair. Unable to weight shift to take steps   ADL: ADL Overall ADL's : Needs assistance/impaired Eating/Feeding: NPO Grooming: Wash/dry face,Maximal assistance,Bed level Upper Body Bathing: Maximal assistance,Sitting Lower Body Bathing: Total assistance,Sit to/from stand,Bed level Upper Body Dressing : Maximal assistance,Sitting Lower Body Dressing: Total assistance,Sit to/from stand,Bed level Toilet Transfer: Maximal assistance,+2 for safety/equipment,+2 for physical assistance,Stand-pivot (recliner) Functional mobility during ADLs: Maximal assistance,+2 for physical assistance,+2 for safety/equipment (stand pivot) General ADL Comments: Pt presenting with decreased following of cues, attending to therapists, sitting balance, and activity tolerance   Cognition: Cognition Overall Cognitive Status: Difficult to assess Arousal/Alertness: Lethargic Orientation Level: Oriented to person,Oriented to place,Oriented to time,Disoriented to situation Attention: Sustained Sustained Attention: Impaired Sustained Attention Impairment: Verbal basic Memory:  (will be assessed further) Awareness: Impaired Awareness Impairment: Intellectual impairment,Emergent impairment,Anticipatory impairment Problem Solving: Impaired (suspect for moderately complex) Safety/Judgment: Impaired Cognition Arousal/Alertness: Awake/alert Behavior During Therapy: Flat affect Overall Cognitive Status: Difficult to assess Area of Impairment: Following commands,Safety/judgement,Awareness Orientation Level: Situation Following Commands: Follows one step  commands consistently Safety/Judgement: Decreased  awareness of deficits Awareness: Intellectual General Comments: Continues with decreased awareness of midline positioning and left sided deficits, however, able to turn head to left with cueing. Following more cues this session. Difficult to assess due to: Non-English speaking   Physical Exam: Blood pressure 134/83, pulse 78, temperature 98.9 F (37.2 C), temperature source Axillary, resp. rate 18, height  (1.499 m), weight 51.3 kg, SpO2 97 %. Physical Exam Constitutional:      General: He is not in acute distress. HENT:     Head: Normocephalic.     Right Ear: External ear normal.     Left Ear: External ear normal.     Mouth/Throat:     Mouth: Mucous membranes are moist.  Eyes:     Conjunctiva/sclera: Conjunctivae normal.  Cardiovascular:     Rate and Rhythm: Normal rate and regular rhythm.     Heart sounds: No murmur heard. No gallop.   Pulmonary:     Effort: Pulmonary effort is normal. No respiratory distress.     Breath sounds: No wheezing.  Abdominal:     General: Bowel sounds are normal. There is no distension.     Palpations: Abdomen is soft.     Tenderness: There is no abdominal tenderness.  Musculoskeletal:        General: No swelling. Normal range of motion.  Skin:    General: Skin is warm and dry.  Neurological:     Mental Status: He is alert.     Comments: Fairly alert today. Follows simple commands with tactile cues, translation. Left central 7. Seems to track to all 4 quadrants.  Moves right side spontaneously. LUE and LLE 0/5. Senses pain on right, minimal response to pain on left.   Psychiatric:     Comments: Flat, cooperates with tactile cues and when words are translated        Lab Results Last 48 Hours        Results for orders placed or performed during the hospital encounter of 04/06/20 (from the past 48 hour(s))  Urinalysis, Routine w reflex microscopic Urine, Clean Catch     Status: Abnormal    Collection Time: 04/08/20  2:38 PM  Result Value  Ref Range    Color, Urine YELLOW YELLOW    APPearance CLEAR CLEAR    Specific Gravity, Urine 1.015 1.005 - 1.030    pH 5.0 5.0 - 8.0    Glucose, UA NEGATIVE NEGATIVE mg/dL    Hgb urine dipstick SMALL (A) NEGATIVE    Bilirubin Urine NEGATIVE NEGATIVE    Ketones, ur NEGATIVE NEGATIVE mg/dL    Protein, ur 30 (A) NEGATIVE mg/dL    Nitrite NEGATIVE NEGATIVE    Leukocytes,Ua TRACE (A) NEGATIVE    RBC / HPF 6-10 0 - 5 RBC/hpf    WBC, UA 6-10 0 - 5 WBC/hpf    Bacteria, UA MANY (A) NONE SEEN    Mucus PRESENT        Comment: Performed at [facility], [address], [place], [place] [address]  Glucose, capillary     Status: Abnormal    Collection Time: 04/08/20  3:36 PM  Result Value Ref Range    Glucose-Capillary 130 (H) 70 - 99 mg/dL      Comment: Glucose reference range applies only to samples taken after fasting for at least 8 hours.  Glucose, capillary     Status: Abnormal    Collection Time: 04/08/20  9:34 PM  Result  Value Ref Range    Glucose-Capillary 121 (H) 70 - 99 mg/dL      Comment: Glucose reference range applies only to samples taken after fasting for at least 8 hours.  CBC     Status: Abnormal    Collection Time: 04/09/20  2:11 AM  Result Value Ref Range    WBC 15.4 (H) 4.0 - 10.5 K/uL    RBC 6.02 (H) 4.22 - 5.81 MIL/uL    Hemoglobin 13.3 13.0 - 17.0 g/dL    HCT 09.3 81.8 - 29.9 %    MCV 66.4 (L) 80.0 - 100.0 fL    MCH 22.1 (L) 26.0 - 34.0 pg    MCHC 33.3 30.0 - 36.0 g/dL    RDW 37.1 69.6 - 78.9 %    Platelets 287 150 - 400 K/uL      Comment: REPEATED TO VERIFY    nRBC 0.0 0.0 - 0.2 %      Comment: Performed at [facility], [address] [place], [place] [address]  Basic metabolic panel     Status: Abnormal    Collection Time: 04/09/20  2:11 AM  Result Value Ref Range    Sodium 135 135 - 145 mmol/L    Potassium 3.8 3.5 - 5.1 mmol/L    Chloride 104 98 - 111 mmol/L    CO2 20 (L) 22 - 32 mmol/L    Glucose, Bld 121 (H) 70 - 99 mg/dL      Comment:  Glucose reference range applies only to samples taken after fasting for at least 8 hours.    BUN 19 8 - 23 mg/dL    Creatinine, Ser 7.51 0.61 - 1.24 mg/dL    Calcium 8.7 (L) 8.9 - 10.3 mg/dL    GFR, Estimated >02 >58 mL/min      Comment: (NOTE) Calculated using the CKD-EPI Creatinine Equation (2021)      Anion gap 11 5 - 15      Comment: Performed at [facility], [address], [place], [place] [address]  Glucose, capillary     Status: Abnormal    Collection Time: 04/09/20  7:57 AM  Result Value Ref Range    Glucose-Capillary 132 (H) 70 - 99 mg/dL      Comment: Glucose reference range applies only to samples taken after fasting for at least 8 hours.  Creatinine, fluid (pleural, peritoneal, JP Drainage)     Status: None    Collection Time: 04/09/20 10:10 AM  Result Value Ref Range    Creat, Fluid >50.0 mg/dL      Comment: RESULTS CONFIRMED BY MANUAL DILUTION (NOTE) No normal range established for this test Results should be evaluated in conjunction with serum values      Fluid Type-FCRE PERITONEAL FLUID        Comment: Performed at [facility], [address], [place], [place] [address]  Glucose, capillary     Status: Abnormal    Collection Time: 04/09/20 12:13 PM  Result Value Ref Range    Glucose-Capillary 140 (H) 70 - 99 mg/dL      Comment: Glucose reference range applies only to samples taken after fasting for at least 8 hours.  Glucose, capillary     Status: Abnormal    Collection Time: 04/09/20  5:08 PM  Result Value Ref Range    Glucose-Capillary 189 (H) 70 - 99 mg/dL      Comment: Glucose reference range applies only to samples taken after fasting  for at least 8 hours.  Glucose, capillary     Status: Abnormal    Collection Time: 04/09/20 10:55 PM  Result Value Ref Range    Glucose-Capillary 111 (H) 70 - 99 mg/dL      Comment: Glucose reference range applies only to samples taken after fasting for at least 8 hours.  CBC     Status: Abnormal    Collection  Time: 04/10/20  5:09 AM  Result Value Ref Range    WBC 12.6 (H) 4.0 - 10.5 K/uL    RBC 5.21 4.22 - 5.81 MIL/uL    Hemoglobin 11.9 (L) 13.0 - 17.0 g/dL    HCT 41.9 (L) 62.2 - 52.0 %    MCV 68.7 (L) 80.0 - 100.0 fL    MCH 22.8 (L) 26.0 - 34.0 pg    MCHC 33.2 30.0 - 36.0 g/dL    RDW 29.7 98.9 - 21.1 %    Platelets 258 150 - 400 K/uL      Comment: REPEATED TO VERIFY    nRBC 0.0 0.0 - 0.2 %      Comment: Performed at [facility] [place] [facility], [address], [place], [place] [address]  Basic metabolic panel     Status: Abnormal    Collection Time: 04/10/20  5:09 AM  Result Value Ref Range    Sodium 137 135 - 145 mmol/L    Potassium 3.6 3.5 - 5.1 mmol/L    Chloride 109 98 - 111 mmol/L    CO2 21 (L) 22 - 32 mmol/L    Glucose, Bld 105 (H) 70 - 99 mg/dL      Comment: Glucose reference range applies only to samples taken after fasting for at least 8 hours.    BUN 27 (H) 8 - 23 mg/dL    Creatinine, Ser 0.81 (H) 0.61 - 1.24 mg/dL    Calcium 8.1 (L) 8.9 - 10.3 mg/dL    GFR, Estimated >44 >81 mL/min      Comment: (NOTE) Calculated using the CKD-EPI Creatinine Equation (2021)      Anion gap 7 5 - 15      Comment: Performed at [facility], [address], [place], [place] [address]  Glucose, capillary     Status: Abnormal    Collection Time: 04/10/20  6:00 AM  Result Value Ref Range    Glucose-Capillary 103 (H) 70 - 99 mg/dL      Comment: Glucose reference range applies only to samples taken after fasting for at least 8 hours.    Comment 1 Notify RN      Comment 2 Document in Chart         Imaging Results (Last 48 hours)  DG Elbow 2 Views Right   Result Date: 04/09/2020 CLINICAL DATA:  Right elbow pain. EXAM: RIGHT ELBOW - 2 VIEW COMPARISON:  None FINDINGS: Advanced elbow joint degenerative changes with joint space narrowing and osteophytic spurring. No acute bony findings or osteochondral lesion. Could not exclude the possibility of the ossified loose body in the joint no obvious joint  effusion. IMPRESSION: Advanced elbow joint degenerative changes. No acute bony findings or joint effusion. Electronically Signed   By: Rudie Meyer M.D.   On: 04/09/2020 14:27    DG CHEST PORT 1 VIEW   Result Date: 04/08/2020 CLINICAL DATA:  Stroke EXAM: PORTABLE CHEST 1 VIEW COMPARISON:  None. FINDINGS: The heart size and mediastinal contours are within normal limits. Prominence of the central pulmonary vasculature is seen. The  visualized skeletal structures are unremarkable. IMPRESSION: Mild pulmonary vascular congestion.  For Electronically Signed   By: Jonna Clark M.D.   On: 04/08/2020 15:03    ECHOCARDIOGRAM COMPLETE   Result Date: 04/08/2020    ECHOCARDIOGRAM REPORT   Patient Name:   [facility]  Date of Exam: 04/08/2020 Medical Rec #:  [id number]  Height:       59.0 in Accession #:    [id number] Weight:       114.5 lb Date of Birth:  1960-12-24  BSA:          1.455 m Patient Age:    60 years   BP:           126/94 mmHg Patient Gender: M          HR:           117 bpm. Exam Location:  Inpatient Procedure: 2D Echo, Cardiac Doppler and Color Doppler Indications:    Stroke I63.9  History:        Patient has no prior history of Echocardiogram examinations.                 CAD; Risk Factors:Hypertension and Diabetes.  Sonographer:    Eulah Pont RDCS Referring Phys: [id number] MCNEILL P KIRKPATRICK IMPRESSIONS  1. Left ventricular ejection fraction, by estimation, is 65 to 70%. The left ventricle has normal function. The left ventricle has no regional wall motion abnormalities. Left ventricular diastolic parameters were normal.  2. Right ventricular systolic function is normal. The right ventricular size is normal. There is mildly elevated pulmonary artery systolic pressure.  3. The mitral valve is normal in structure. Trivial mitral valve regurgitation. No evidence of mitral stenosis.  4. The aortic valve is normal in structure. Aortic valve regurgitation is not visualized. No aortic stenosis is present.  5. The  inferior vena cava is normal in size with greater than 50% respiratory variability, suggesting right atrial pressure of 3 mmHg. FINDINGS  Left Ventricle: Left ventricular ejection fraction, by estimation, is 65 to 70%. The left ventricle has normal function. The left ventricle has no regional wall motion abnormalities. The left ventricular internal cavity size was normal in size. There is  no left ventricular hypertrophy. Left ventricular diastolic parameters were normal. Right Ventricle: The right ventricular size is normal. No increase in right ventricular wall thickness. Right ventricular systolic function is normal. There is mildly elevated pulmonary artery systolic pressure. The tricuspid regurgitant velocity is 2.93  m/s, and with an assumed right atrial pressure of 3 mmHg, the estimated right ventricular systolic pressure is 37.3 mmHg. Left Atrium: Left atrial size was normal in size. Right Atrium: Right atrial size was normal in size. Pericardium: There is no evidence of pericardial effusion. Mitral Valve: The mitral valve is normal in structure. Trivial mitral valve regurgitation. No evidence of mitral valve stenosis. Tricuspid Valve: The tricuspid valve is normal in structure. Tricuspid valve regurgitation is trivial. No evidence of tricuspid stenosis. Aortic Valve: The aortic valve is normal in structure. Aortic valve regurgitation is not visualized. No aortic stenosis is present. Pulmonic Valve: The pulmonic valve was normal in structure. Pulmonic valve regurgitation is not visualized. No evidence of pulmonic stenosis. Aorta: The aortic root is normal in size and structure. Venous: The inferior vena cava is normal in size with greater than 50% respiratory variability, suggesting right atrial pressure of 3 mmHg. IAS/Shunts: The interatrial septum was not well visualized.  LEFT VENTRICLE PLAX 2D LVIDd:  4.30 cm  Diastology LVIDs:         2.60 cm  LV e' medial:    8.16 cm/s LV PW:         0.90 cm  LV  E/e' medial:  10.8 LV IVS:        0.90 cm  LV e' lateral:   10.60 cm/s LVOT diam:     1.90 cm  LV E/e' lateral: 8.3 LV SV:         54 LV SV Index:   37 LVOT Area:     2.84 cm  RIGHT VENTRICLE RV S prime:     17.20 cm/s TAPSE (M-mode): 2.3 cm LEFT ATRIUM             Index       RIGHT ATRIUM           Index LA diam:        2.90 cm 1.99 cm/m  RA Area:     10.60 cm LA Vol (A2C):   20.5 ml 14.09 ml/m RA Volume:   22.20 ml  15.26 ml/m LA Vol (A4C):   22.0 ml 15.12 ml/m LA Biplane Vol: 22.6 ml 15.53 ml/m  AORTIC VALVE LVOT Vmax:   115.00 cm/s LVOT Vmean:  82.000 cm/s LVOT VTI:    0.189 m  AORTA Ao Root diam: 3.10 cm Ao Asc diam:  3.00 cm MITRAL VALVE                TRICUSPID VALVE MV Area (PHT): 3.60 cm     TR Peak grad:   34.3 mmHg MV Decel Time: 211 msec     TR Vmax:        293.00 cm/s MV E velocity: 88.30 cm/s MV A velocity: 100.00 cm/s  SHUNTS MV E/A ratio:  0.88         Systemic VTI:  0.19 m                             Systemic Diam: 1.90 cm Charlton HawsPeter Nishan MD Electronically signed by Charlton HawsPeter Nishan MD Signature Date/Time: 04/08/2020/10:08:03 AM    Final     CT HEMATURIA WORKUP   Result Date: 04/08/2020 CLINICAL DATA:  60 year old male with hematuria. EXAM: CT ABDOMEN AND PELVIS WITHOUT AND WITH CONTRAST TECHNIQUE: Multidetector CT imaging of the abdomen and pelvis was performed following the standard protocol before and following the bolus administration of intravenous contrast. CONTRAST:  125mL OMNIPAQUE IOHEXOL 300 MG/ML  SOLN COMPARISON:  CT abdomen pelvis dated 09/17/2016. FINDINGS: Evaluation of this exam is limited due to motion artifact as well as streak artifact caused by patient's arms. Lower chest: Mild interstitial coarsening and right lung base atelectasis. The visualized lung bases are otherwise clear. No intra-abdominal free air or free fluid. Hepatobiliary: The liver is unremarkable. No intrahepatic biliary ductal dilatation. The gallbladder is unremarkable. Pancreas: Unremarkable. No pancreatic  ductal dilatation or surrounding inflammatory changes. Spleen: Normal in size without focal abnormality. Adrenals/Urinary Tract: The adrenal glands unremarkable. There is a small stone or 2 adjacent stones with combined length of approximately 5 mm in the distal left ureter additional punctate focus may be present in the distal left ureter more distally. There is moderate left hydroureter with severe left hydronephrosis. There is severe left renal parenchyma atrophy and cortical thinning secondary to chronic hydronephrosis. Two punctate nonobstructing left renal inferior pole calculi noted. No obvious enhancing lesion identified. Evaluation however is very limited  due to respiratory motion artifact. There is a linear band through the proximal left ureter with focal luminal narrowing likely representing adhesion or scarring. This can be better evaluated with direct visualization and scoping. The right kidney is unremarkable. Thin linear band noted in the proximal right ureter (coronal 39/16 and axial 39/13) likely adhesion or scarring. The urinary bladder appears unremarkable. Stomach/Bowel: There is moderate stool throughout the colon. There is no bowel obstruction or active inflammation. The appendix is normal. Vascular/Lymphatic: Moderate aortoiliac atherosclerotic disease. The IVC is unremarkable. No portal venous gas. There is no adenopathy. Reproductive: The prostate and seminal vesicles are grossly unremarkable. No pelvic mass. Other: None Musculoskeletal: Degenerative changes of the spine. No acute osseous pathology. IMPRESSION: 1. Several tiny stones in the distal left ureter. There is moderate left hydroureter and severe left hydronephrosis. 2. Severe left renal parenchyma atrophy and cortical thinning secondary to chronic hydronephrosis. 3. Linear band through the proximal left ureter with focal luminal narrowing likely representing adhesion or scarring. This can be better evaluated with direct  visualization and scoping. 4. Two punctate nonobstructing left renal inferior pole calculi. 5. No bowel obstruction. Normal appendix. 6. Aortic Atherosclerosis (ICD10-I70.0). Electronically Signed   By: Elgie Collard M.D.   On: 04/08/2020 23:05             Medical Problem List and Plan: 1.  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  Antithrombotics: -DVT/anticoagulation: Subcutaneous heparin initiated for 02/25/2020             -antiplatelet therapy: N/A 3. Pain Management: Tylenol as needed 4. Mood: Provide emotional support             -antipsychotic agents: N/A 5. Neuropsych: This patient is capable of making decisions on his own behalf. 6. Skin/Wound Care: Routine skin checks 7. Fluids/Electrolytes/Nutrition: Routine in and outs with follow-up chemistries in am. 8.  Hypertension.  Norvasc 10 mg daily, clonidine 0.1 mg twice daily, Lopressor 50 mg twice daily.               -monitor for patterns as he begins to mobilize with therapy 9.  History of open Uretlithotomy 2005 in [place] as well as left PCNL and laser ureterotomy October 2018 followed by Dr. Marlou Porch.  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              -CONTINUE FOLEY TUBE. DO NOT REMOVE . Foley catheter to remain in place until follow-up in the office of Dr. Modena Slater for cystogram. 10.  Diet controlled diabetes mellitus.  Hemoglobin A1c 5.8.  SSI             -Educate pt/family as needed     Charlton Amor, PA-C 04/10/2020  I have personally performed a face to face diagnostic evaluation of this patient and formulated the key components of the plan.  Additionally, I have personally reviewed laboratory data, imaging studies, as well as relevant notes and  concur with the physician assistant's documentation above.  The patient's status has not changed from the original H&P.  Any changes in documentation from the acute care chart have been noted above.  Ranelle Oyster, MD, [place]

## 2020-04-11 NOTE — Progress Notes (Signed)
Inpatient Rehabilitation Admissions Coordinator  I have insurance approval and CIR bed to admit patient to today. I have contacted Dr. Jerral Ralph, acute team and TOC. I will make the arrangements to admit today. I will discuss with wife with use if interpretor.  Ottie Glazier, RN, MSN Rehab Admissions Coordinator [phone] 04/11/2020 10:39 AM

## 2020-04-11 NOTE — Progress Notes (Signed)
Standley BrookingBoyette, Trini Soldo G, RN  Rehab Admission Coordinator  Physical Medicine and Rehabilitation  PMR Pre-admission      Signed  Date of Service:  04/11/2020 10:51 AM      Related encounter: ED to Hosp-Admission (Discharged) [facility]       Signed          Show:Clear all [x] Manual[x] Template[x] Copied  Added by: [x] Standley BrookingBoyette, Aleera Gilcrease G, RN   [] Hover for details  PMR Admission Coordinator Pre-Admission Assessment   Patient: Anthony Huber is an 60 y.o., male MRN: [id number] DOB: 1960/11/17 Height: 4\' 11"  (149.9 cm) Weight: 51.3 kg                                                                                                                                                  Insurance Information HMO:     PPO:      PCP:      IPA:      80/20:      OTHER:  PRIMARY: [employer] [place]      Policy#: [id number]      Subscriber: pt CM Name: faxed approval      Phone#: [phone]     Fax#: [phone] Pre-Cert#: [id number] approved until 4/20    Employer:  Benefits:  Phone #: [phone]     Name:  Eff. Date: 10/06/2019     Deduct: $1750      Out of Pocket Max: $4500      Life Max: none  CIR: $250 per admit then covers 90%      SNF: 90% 60 days per year Outpatient: 70%     Co-Pay: 30 visits combined Home Health: 90%      Co-Pay: visits per medical neccesity DME: 70%     Co-Pay: 30% Providers: in network  SECONDARY: none   Financial Counselor:       Phone#:    The Data processing manager"Data Collection Information Summary" for patients in Inpatient Rehabilitation Facilities with attached "Privacy Act Statement-Health Care Records" was provided and verbally reviewed with: N/A   Emergency Contact Information         Contact Information     Name Relation Home Work Mobile    Kpa,Bol Son [phone]           Current Medical History  Patient Admitting Diagnosis: ICH   History of Present Illness:Anthony Huber is a 60 year old right-handed male with history of open  ure[place]olithotomy 2005 in [place] as well as left PCNL and laser ureterotomy October 2018 per Dr. [name], CAD, diabetes mellitus and hypertension with remote tobacco use.  Per chart review lives with spouse independent prior to admission.  1 level home.  He works at a Education officer, [employer] plant.  Presented 04/06/2020 with altered mental status headache and left-sided weakness.  Blood pressure 170/100.  Cranial CT scan showed a 4.3 x  2.4 x 3.4 cm acute intraparenchymal hemorrhage centered at the right basal ganglia.  Localized mass-effect with up to 5 mm right to left shift.  Admission chemistries unremarkable except hemoglobin A1c 5.8 WBC 15,700.  MRI/MRA showed stable size and morphology of right basal ganglia intraparenchymal hemorrhage with similar surrounding vasogenic edema and regional mass-effect.  Associated 5 mm right to left shift.  No intraventricular extension hydrocephalus or ventricular trapping.  MRA was unremarkable.  Echocardiogram with ejection fraction of 65 to 70% no wall motion abnormalities.  Maintained on Cleviprex for blood pressure control.  Patient was cleared to begin subcutaneous heparin for DVT prophylaxis 04/06/2020.  Hospital course follow-up urology services 04/08/2020 for evaluation of leaking urine from his incision line which had been intermittent since ureterotomy.  CT abdomen pelvis showed several tiny stones in the distal left ureter.  Moderate left hydroureter and severe left hydronephrosis.  Severe left renal parenchymal atrophy and cortical thinning secondary to chronic hydronephrosis.  Linear band to the proximal left ureter with focal luminal narrowing likely representing adhesion or scarring with 2 punctate non obstructing left renal inferior pole calculi and fluid cultures were sent and plan Foley catheter tube insertion.  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       Complete NIHSS TOTAL: 18 Glasgow Coma Scale Score: 14   Past Medical History      Past Medical History:  Diagnosis Date  . Coronary artery disease    . Diabetes mellitus without complication (HCC)    . Hypertension        Family History  family history is not on file.   Prior Rehab/Hospitalizations:  Has the patient had prior rehab or hospitalizations prior to admission? Yes   Has the patient had major surgery during 100 days prior to admission? No   Current Medications    Current Facility-Administered Medications:  .   stroke: mapping our early stages of recovery book, , Does not apply, Once, Rejeana Brock, MD .  0.9 %  sodium chloride infusion, , Intravenous, Continuous, Ghimire, Werner Lean, MD, Last Rate: 10 mL/hr at 04/11/20 0904, New Bag at 04/11/20 0904 .  acetaminophen (TYLENOL) tablet 650 mg, 650 mg, Oral, Q4H PRN, 650 mg at 04/09/20 1255 **OR** acetaminophen (TYLENOL) 160 MG/5ML solution 650 mg, 650 mg, Per Tube, Q4H PRN **OR** acetaminophen (TYLENOL) suppository 650 mg, 650 mg, Rectal, Q4H PRN, Rejeana Brock, MD .  amLODipine (NORVASC) tablet 10 mg, 10 mg, Oral, Daily, Olivencia-Simmons, Ivelisse, NP, 10 mg at 04/11/20 0900 .  Chlorhexidine Gluconate Cloth 2 % PADS 6 each, 6 each, Topical, Daily, Marvel Plan, MD, 6 each at 04/11/20 0900 .  cloNIDine (CATAPRES) tablet 0.1 mg, 0.1 mg, Oral, BID, Marvel Plan, MD, 0.1 mg at 04/11/20 0900 .  heparin injection 5,000 Units, 5,000 Units, Subcutaneous, Q12H, Marvel Plan, MD, 5,000 Units at 04/11/20 0900 .  hydrALAZINE (APRESOLINE) injection 5-10 mg, 5-10 mg, Intravenous, Q4H PRN, Marvel Plan, MD .  insulin aspart (novoLOG) injection 0-6 Units, 0-6 Units, Subcutaneous, TID WC, Marvel Plan, MD, 1 Units at 04/09/20 1722 .  labetalol (NORMODYNE) injection 5-20 mg, 5-20 mg, Intravenous, Q2H PRN, Marvel Plan, MD .  metoprolol tartrate (LOPRESSOR) tablet 50 mg, 50 mg, Oral, BID, Marvel Plan, MD, 50 mg at 04/11/20  0900 .  ondansetron (ZOFRAN) injection 4 mg, 4 mg, Intravenous, Q6H PRN, Marvel Plan,  MD .  pantoprazole (PROTONIX) EC tablet 40 mg, 40 mg, Oral, Daily, Marvel Plan, MD, 40 mg at 04/11/20 0900 .  senna-docusate (Senokot-S) tablet 1 tablet, 1 tablet, Oral, BID, Rejeana Brock, MD, 1 tablet at 04/11/20 0901   Patients Current Diet:     Diet Order                      Diet - low sodium heart healthy              Diet Carb Modified Fluid consistency: Thin; Room service appropriate? 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   Self Care: Did the patient need help bathing, dressing, using the toilet or eating?  Independent   Indoor Mobility: Did the patient need assistance with walking from room to room (with or without device)? Independent   Stairs: Did the patient need assistance with internal or external stairs (with or without device)? Independent   Functional Cognition: Did the patient need help planning regular tasks such as shopping or remembering to take medications? Independent   Home Assistive Devices / Equipment Home Assistive Devices/Equipment: None Home Equipment: None   Prior Device Use: Indicate devices/aids used by the patient prior to current illness, exacerbation or injury? None of the above   Current Functional Level Cognition   Arousal/Alertness: Lethargic Overall Cognitive Status: Difficult to assess Difficult to assess due to: Non-English speaking Orientation Level: Oriented to  person,Oriented to place,Disoriented to time Following Commands: Follows one step commands inconsistently,Follows one step commands with increased time Safety/Judgement: Decreased awareness of deficits,Decreased awareness of safety General Comments: Continues with decreased awareness of midline positioning and left sided deficits, however, able to turn head to left with cueing. Continual multi-modal cues to cease pushing with R UE towards L, poor carryover. Attention: Sustained Sustained Attention: Impaired Sustained Attention Impairment: Verbal basic Memory:  (will be assessed further) Awareness: Impaired Awareness Impairment: Intellectual impairment,Emergent impairment,Anticipatory impairment Problem Solving: Impaired (suspect for moderately complex) Safety/Judgment: Impaired    Extremity Assessment (includes Sensation/Coordination)   Upper Extremity Assessment: LUE deficits/detail,RUE deficits/detail RUE Deficits / Details: Limited ROM and strength due to pain at elbow. IV at elbow was taken out yesterday but pain continues LUE Deficits / Details: Noting increased tone with bicept into elbow flexion. Tendency for flexed position at hand, wrist, and elbow. Pt able to intiating tricep to push hand awar from mouth in gravity eliminated plane LUE Coordination: decreased fine motor,decreased gross motor  Lower Extremity Assessment: Defer to PT evaluation RLE Deficits / Details: WFL LLE Deficits / Details: Grossly 2/5     ADLs   Overall ADL's : Needs assistance/impaired Eating/Feeding: NPO Grooming: Wash/dry face,Maximal assistance,Bed level Upper Body Bathing: Maximal assistance,Sitting Lower Body Bathing: Total assistance,Sit to/from stand,Bed level Upper Body Dressing : Maximal assistance,Sitting Lower Body Dressing: Total assistance,Sit to/from stand,Bed level Toilet Transfer: Maximal assistance,+2 for safety/equipment,+2 for physical assistance,Stand-pivot (recliner) Functional  mobility during ADLs: Maximal assistance,+2 for physical assistance,+2  for safety/equipment (stand pivot) General ADL Comments: Pt presenting with decreased following of cues, attending to therapists, sitting balance, and activity tolerance     Mobility   Overal bed mobility: Needs Assistance Bed Mobility: Supine to Sit Rolling: Mod assist Sidelying to sit: Max assist,+2 for physical assistance Supine to sit: Max assist,+2 for physical assistance,+2 for safety/equipment,HOB elevated General bed mobility comments: Pt pivoted on buttocks to allow for improved positioning of R hand for R rail to assist in pull to sit, but pt's R elbow hurting and preventing him from pulling. Thus, maxAx2 to manage L leg off bed and ascend trunk.     Transfers   Overall transfer level: Needs assistance Equipment used: Ambulation equipment used Transfer via Lift Equipment: Stedy Transfers: Sit to/from [employer] to Stand: Max assist,+2 physical assistance,+2 safety/equipment Stand pivot transfers: Max assist,+2 physical assistance Squat pivot transfers: Total assist,+2 physical assistance,+2 safety/equipment General transfer comment: Knees blocked with use of stedy, cuing pt to maintain midline and R hand on bar but poor following of directions. Thus, needed maxAx2 to prevent lean to either side and to power up to stand and gain enough hip extension to drop seat of stedy under pt's buttocks. TA with use of stedy to transfer to chair.     Ambulation / Gait / Stairs / Wheelchair Mobility   Ambulation/Gait General Gait Details: NT, unsafe at this time.     Posture / Balance Dynamic Sitting Balance Sitting balance - Comments: Left lateral lean,, min-maxA for sitting balance Balance Overall balance assessment: Needs assistance Sitting-balance support: Feet supported Sitting balance-Leahy Scale: Poor Sitting balance - Comments: Left lateral lean,, min-maxA for sitting balance Postural  control: Left lateral lean Standing balance support: Bilateral upper extremity supported Standing balance-Leahy Scale: Poor Standing balance comment: up to maxA in standing, left lateral lean.     Special needs/care consideration [place] with [place]isl[place] Sp[place] language    Previous Home Environment  Living Arrangements: Spouse/significant other,Children  Lives With: Spouse Available Help at Discharge: Family Type of Home: House Home Layout: One level Home Access: Level entry Bathroom Shower/Tub: Health visitor: Standard Home Care Services: No   Discharge Living Setting Plans for Discharge Living Setting: Patient's home,Lives with (comment) (spouse) Type of Home at Discharge: House Discharge Home Layout: One level Discharge Home Access: Level entry Discharge Bathroom Shower/Tub: Walk-in shower Discharge Bathroom Toilet: Standard Discharge Bathroom Accessibility: Yes How Accessible: Accessible via walker Does the patient have any problems obtaining your medications?: No   Social/Family/Support Systems Patient Roles: Spouse Contact Information: son, Bernadette Hoit Anticipated Caregiver: wife and family Anticipated Caregiver's Contact Information: see above Ability/Limitations of Caregiver: wife at home, son works Engineer, structural Availability: 24/7 Discharge Plan Discussed with Primary Caregiver: Yes Is Caregiver In Agreement with Plan?: Yes Does Caregiver/Family have Issues with Lodging/Transportation while Pt is in Rehab?: No   Goals Patient/Family Goal for Rehab: superivsion to min asisst with PT, OT and SLP Expected length of stay: ELOS 20 to 28 days Cultural Considerations: Elissa Lovett Pt/Family Agrees to Admission and willing to participate: Yes Program Orientation Provided & Reviewed with Pt/Caregiver Including Roles  & Responsibilities: Yes   Decrease burden of Care through IP rehab admission: n/a   Possible need for SNF placement upon discharge:not anticipated    Patient Condition: This patient's condition remains as documented in the consult dated 04/09/2020, in which the Rehabilitation Physician determined and documented that the patient's condition is appropriate for intensive rehabilitative care in an inpatient rehabilitation facility. Will  admit to inpatient rehab today.   Preadmission Screen Completed By:  Clois Dupes, RN, 04/11/2020 10:51 AM ______________________________________________________________________   Discussed status with Dr. Riley Kill on 04/11/2020 at  1057 and received approval for admission today.   Admission Coordinator:  Clois Dupes, time 7564 Date 04/11/2020             Cosigned by: Ranelle Oyster, MD at 04/11/2020 10:59 AM    Revision History                    Note Details  Author Standley Brooking, RN File Time 04/11/2020 10:57 AM  Author Type Rehab Admission Coordinator Status Signed  Last Editor Standley Brooking, RN Service Physical Medicine and [facility] Acct # [id number] Admit Date 04/11/2020

## 2020-04-11 NOTE — PMR Pre-admission (Signed)
PMR Admission Coordinator Pre-Admission Assessment  Patient: Anthony Huber is an 60 y.o., male MRN: [id number] DOB: [birth year] Height: 4\' 11"  (149.9 cm) Weight: 51.3 kg              Insurance Information HMO:     PPO:      PCP:      IPA:      80/20:      OTHER:  PRIMARY: [employer] of [place]      Policy#:      Subscriber: pt CM Name: faxed approval      Phone#: [phone]     Fax#: [phone] Pre-Cert#: [id number] approved until 4/20    Employer:  Benefits:  Phone #: [phone]     Name:  Eff. Date: 10/06/2019     Deduct: $1750      Out of Pocket Max: $4500      Life Max: none  CIR: $250 per admit then covers 90%      SNF: 90% 60 days per year Outpatient: 70%     Co-Pay: 30 visits combined Home Health: 90%      Co-Pay: visits per medical neccesity DME: 70%     Co-Pay: 30% Providers: in network  SECONDARY: none  Financial Counselor:       Phone#:   The 12/06/2019" for patients in Inpatient Rehabilitation Facilities with attached "Privacy Act Statement-Health Care Records" was provided and verbally reviewed with: N/A  Emergency Contact Information Contact Information    Name Relation Home Work Mobile   Kpa,Bol Son [phone]       Current Medical History  Patient Admitting Diagnosis: ICH  History of Present Illness:Theoden Mroczka is a 60 year old right-handed male with history of open ureterolithotomy 2005 in 2006 as well as left PCNL and laser ureterotomy October 2018 per Dr. November 2018, CAD, diabetes mellitus and hypertension with remote tobacco use.  Per chart review lives with spouse independent prior to admission.  1 level home.  He works at a [employer].  Presented 04/06/2020 with altered mental status headache and left-sided weakness.  Blood pressure 170/100.  Cranial CT scan showed a 4.3 x 2.4 x 3.4 cm acute intraparenchymal hemorrhage centered at the right basal ganglia.  Localized mass-effect with up to 5 mm right to left shift.  Admission  chemistries unremarkable except hemoglobin A1c 5.8 WBC 15,700.  MRI/MRA showed stable size and morphology of right basal ganglia intraparenchymal hemorrhage with similar surrounding vasogenic edema and regional mass-effect.  Associated 5 mm right to left shift.  No intraventricular extension hydrocephalus or ventricular trapping.  MRA was unremarkable.  Echocardiogram with ejection fraction of 65 to 70% no wall motion abnormalities.  Maintained on Cleviprex for blood pressure control.  Patient was cleared to begin subcutaneous heparin for DVT prophylaxis 04/06/2020.  Hospital course follow-up urology services 04/08/2020 for evaluation of leaking urine from his incision line which had been intermittent since ureterotomy.  CT abdomen pelvis showed several tiny stones in the distal left ureter.  Moderate left hydroureter and severe left hydronephrosis.  Severe left renal parenchymal atrophy and cortical thinning secondary to chronic hydronephrosis.  Linear band to the proximal left ureter with focal luminal narrowing likely representing adhesion or scarring with 2 punctate non obstructing left renal inferior pole calculi and fluid cultures were sent and plan Foley catheter tube insertion.  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     Complete NIHSS TOTAL: 18 Glasgow Coma Scale Score: 14  Past Medical History  Past Medical History:  Diagnosis Date  . Coronary artery disease   . Diabetes mellitus without complication (HCC)   . Hypertension     Family History  family history is not on file.  Prior Rehab/Hospitalizations:  Has the patient had prior rehab or hospitalizations prior to admission? Yes  Has the patient had major surgery during 100 days prior to admission? No  Current Medications   Current Facility-Administered Medications:  .   stroke: mapping our early stages of  recovery book, , Does not apply, Once, Rejeana Brock, MD .  0.9 %  sodium chloride infusion, , Intravenous, Continuous, Ghimire, Werner Lean, MD, Last Rate: 10 mL/hr at 04/11/20 0904, New Bag at 04/11/20 0904 .  acetaminophen (TYLENOL) tablet 650 mg, 650 mg, Oral, Q4H PRN, 650 mg at 04/09/20 1255 **OR** acetaminophen (TYLENOL) 160 MG/5ML solution 650 mg, 650 mg, Per Tube, Q4H PRN **OR** acetaminophen (TYLENOL) suppository 650 mg, 650 mg, Rectal, Q4H PRN, Rejeana Brock, MD .  amLODipine (NORVASC) tablet 10 mg, 10 mg, Oral, Daily, Olivencia-Simmons, Ivelisse, NP, 10 mg at 04/11/20 0900 .  Chlorhexidine Gluconate Cloth 2 % PADS 6 each, 6 each, Topical, Daily, Marvel Plan, MD, 6 each at 04/11/20 0900 .  cloNIDine (CATAPRES) tablet 0.1 mg, 0.1 mg, Oral, BID, Marvel Plan, MD, 0.1 mg at 04/11/20 0900 .  heparin injection 5,000 Units, 5,000 Units, Subcutaneous, Q12H, Marvel Plan, MD, 5,000 Units at 04/11/20 0900 .  hydrALAZINE (APRESOLINE) injection 5-10 mg, 5-10 mg, Intravenous, Q4H PRN, Marvel Plan, MD .  insulin aspart (novoLOG) injection 0-6 Units, 0-6 Units, Subcutaneous, TID WC, Marvel Plan, MD, 1 Units at 04/09/20 1722 .  labetalol (NORMODYNE) injection 5-20 mg, 5-20 mg, Intravenous, Q2H PRN, Marvel Plan, MD .  metoprolol tartrate (LOPRESSOR) tablet 50 mg, 50 mg, Oral, BID, Marvel Plan, MD, 50 mg at 04/11/20 0900 .  ondansetron (ZOFRAN) injection 4 mg, 4 mg, Intravenous, Q6H PRN, Marvel Plan, MD .  pantoprazole (PROTONIX) EC tablet 40 mg, 40 mg, Oral, Daily, Marvel Plan, MD, 40 mg at 04/11/20 0900 .  senna-docusate (Senokot-S) tablet 1 tablet, 1 tablet, Oral, BID, Rejeana Brock, MD, 1 tablet at 04/11/20 0901  Patients Current Diet:  Diet Order            Diet - low sodium heart healthy           Diet Carb Modified Fluid consistency: Thin; Room service appropriate? 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  Self Care: Did the patient need help bathing, dressing, using the toilet or eating?  Independent  Indoor Mobility: Did the patient need assistance with walking from room to room (with or without device)? Independent  Stairs: Did the patient need assistance with internal or external stairs (with or without device)? Independent  Functional Cognition: Did the patient need help planning regular tasks such as shopping or remembering to take medications? Independent  Home Assistive Devices / Equipment Home Assistive Devices/Equipment: None Home Equipment: None  Prior Device Use: Indicate devices/aids used by the patient prior to current illness, exacerbation or injury? None of the above  Current Functional Level Cognition  Arousal/Alertness: Lethargic Overall Cognitive Status: Difficult to assess Difficult to assess due to: Non-English speaking Orientation Level: Oriented to person,Oriented to place,Disoriented to time Following Commands: Follows one step commands inconsistently,Follows one step commands with increased time Safety/Judgement: Decreased awareness of deficits,Decreased awareness of safety General Comments: Continues with decreased awareness of midline positioning and left sided deficits, however, able to turn head to left with cueing. Continual multi-modal cues to cease pushing with R UE towards L, poor carryover. Attention: Sustained Sustained Attention: Impaired Sustained Attention Impairment: Verbal basic Memory:  (will be assessed  further) Awareness: Impaired Awareness Impairment: Intellectual impairment,Emergent impairment,Anticipatory impairment Problem Solving: Impaired (suspect for moderately complex) Safety/Judgment: Impaired    Extremity Assessment (includes Sensation/Coordination)  Upper Extremity Assessment: LUE deficits/detail,RUE deficits/detail RUE Deficits / Details: Limited ROM and strength due to pain at elbow. IV at elbow was taken out yesterday but pain continues LUE Deficits / Details: Noting increased tone with bicept into elbow flexion. Tendency for flexed position at hand, wrist, and elbow. Pt able to intiating tricep to push hand awar from mouth in gravity eliminated plane LUE Coordination: decreased fine motor,decreased gross motor  Lower Extremity Assessment: Defer to PT evaluation RLE Deficits / Details: WFL LLE Deficits / Details: Grossly 2/5    ADLs  Overall ADL's : Needs assistance/impaired Eating/Feeding: NPO Grooming: Wash/dry face,Maximal assistance,Bed level Upper Body Bathing: Maximal assistance,Sitting Lower Body Bathing: Total assistance,Sit to/from stand,Bed level Upper Body Dressing : Maximal assistance,Sitting Lower Body Dressing: Total assistance,Sit to/from stand,Bed level Toilet Transfer: Maximal assistance,+2 for safety/equipment,+2 for physical assistance,Stand-pivot (recliner) Functional mobility during ADLs: Maximal assistance,+2 for physical assistance,+2 for safety/equipment (stand pivot) General ADL Comments: Pt presenting with decreased following of cues, attending to therapists, sitting balance, and activity tolerance    Mobility  Overal bed mobility: Needs Assistance Bed Mobility: Supine to Sit Rolling: Mod assist Sidelying to sit: Max assist,+2 for physical assistance Supine to sit: Max assist,+2 for physical assistance,+2 for safety/equipment,HOB elevated General bed mobility comments: Pt pivoted on buttocks to allow for improved positioning of R hand for R  rail to assist in pull to sit, but pt's R elbow hurting and preventing him from pulling. Thus, maxAx2 to manage L leg off bed and ascend trunk.    Transfers  Overall transfer level: Needs assistance Equipment used: Ambulation equipment used Transfer via Lift Equipment: Stedy Transfers: Sit to/from [employer] to Stand: Max assist,+2 physical assistance,+2 safety/equipment Stand pivot transfers: Max assist,+2 physical assistance Squat pivot transfers: Total assist,+2 physical assistance,+2 safety/equipment General transfer comment: Knees blocked with use of stedy, cuing pt to maintain midline and R hand on bar but poor following of directions. Thus, needed maxAx2 to prevent lean to either side and to power up to stand and gain enough hip extension to drop seat of stedy under pt's buttocks. TA with use of stedy to transfer to chair.    Ambulation / Gait / Stairs / Wheelchair Mobility  Ambulation/Gait General Gait Details: NT, unsafe at this time.    Posture / Balance Dynamic Sitting Balance Sitting balance - Comments: Left lateral lean,, min-maxA for sitting balance Balance Overall balance assessment: Needs assistance Sitting-balance support: Feet supported Sitting balance-Leahy Scale: Poor Sitting balance - Comments: Left lateral lean,, min-maxA for sitting balance Postural control: Left  lateral lean Standing balance support: Bilateral upper extremity supported Standing balance-Leahy Scale: Poor Standing balance comment: up to maxA in standing, left lateral lean.    Special needs/care consideration [place] with Estanislado SpireJarai language   Previous Home Environment  Living Arrangements: Spouse/significant other,Children  Lives With: Spouse Available Help at Discharge: Family Type of Home: House Home Layout: One level Home Access: Level entry Bathroom Shower/Tub: Health visitorWalk-in shower Bathroom Toilet: Standard Home Care Services: No  Discharge Living Setting Plans for  Discharge Living Setting: Patient's home,Lives with (comment) (spouse) Type of Home at Discharge: House Discharge Home Layout: One level Discharge Home Access: Level entry Discharge Bathroom Shower/Tub: Walk-in shower Discharge Bathroom Toilet: Standard Discharge Bathroom Accessibility: Yes How Accessible: Accessible via walker Does the patient have any problems obtaining your medications?: No  Social/Family/Support Systems Patient Roles: Spouse Contact Information: son, Bernadette HoitBol Anticipated Caregiver: wife and family Anticipated Caregiver's Contact Information: see above Ability/Limitations of Caregiver: wife at home, son works Engineer, structuralCaregiver Availability: 24/7 Discharge Plan Discussed with Primary Caregiver: Yes Is Caregiver In Agreement with Plan?: Yes Does Caregiver/Family have Issues with Lodging/Transportation while Pt is in Rehab?: No  Goals Patient/Family Goal for Rehab: superivsion to min asisst with PT, OT and SLP Expected length of stay: ELOS 20 to 28 days Cultural Considerations: Elissa LovettMontagnard; Jarai Pt/Family Agrees to Admission and willing to participate: Yes Program Orientation Provided & Reviewed with Pt/Caregiver Including Roles  & Responsibilities: Yes  Decrease burden of Care through IP rehab admission: n/a  Possible need for SNF placement upon discharge:not anticipated  Patient Condition: This patient's condition remains as documented in the consult dated 04/09/2020, in which the Rehabilitation Physician determined and documented that the patient's condition is appropriate for intensive rehabilitative care in an inpatient rehabilitation facility. Will admit to inpatient rehab today.  Preadmission Screen Completed By:  Clois DupesBoyette, Kyliyah Stirn Godwin, RN, 04/11/2020 10:51 AM ______________________________________________________________________   Discussed status with Dr. Riley KillSwartz on 04/11/2020 at  1057 and received approval for admission today.  Admission Coordinator:  Clois DupesBoyette, Genecis Veley  Godwin, time [phone] Date 04/11/2020

## 2020-04-11 NOTE — Progress Notes (Signed)
Inpatient Rehabilitation Medication Review by a Pharmacist  A complete drug regimen review was completed for this patient to identify any potential clinically significant medication issues.  Clinically significant medication issues were identified:  Yes   Type of Medication Issue Identified Description of Issue Urgent (address now) Non-Urgent (address on AM team rounds) Plan Plan Accepted by Provider? (Yes / No / Pending AM Rounds)  Drug Interaction(s) (clinically significant)       Duplicate Therapy       Allergy       No Medication Administration End Date       Incorrect Dose       Additional Drug Therapy Needed  Per discharge summary, plan is to start statin at discharge from [facility]; statin not ordered on admission to CIR Non urgent Contact Deatra Ina, PA Pending review by CIR provider  Other         Name of provider notified for urgent issues identified:  N/A  For non-urgent medication issues to be resolved on team rounds tomorrow morning a [facility] Secure Chat Handoff was sent to:  Deatra Ina, PA  Time spent performing this drug regimen review (minutes):  20  Vicki Mallet, PharmD, BCPS, [facility] Clinical Pharmacist 04/11/2020 3:43 PM

## 2020-04-11 NOTE — Progress Notes (Signed)
Inpatient Rehabilitation  Patient information reviewed and entered into eRehab system by Hardeep Reetz M. Seraiah Nowack, M.A., CCC/SLP, PPS Coordinator.  Information including medical coding, functional ability and quality indicators will be reviewed and updated through discharge.    

## 2020-04-11 NOTE — IPOC Note (Signed)
Individualized overall Plan of Care [facility]) Patient Details Name: Anthony Huber MRN: [id number] DOB: 01-20-1960  Admitting Diagnosis: ICH (intracerebral hemorrhage) [facility])  Hospital Problems: Principal Problem:   ICH (intracerebral hemorrhage) (HCC) Active Problems:   Slow transit constipation   Chronic gout of right knee   Acute blood loss anemia   Leukocytosis     Functional Problem List: Nursing Medication Management,Nutrition,Endurance,Safety  PT Balance,Behavior,Edema,Endurance,Motor,Nutrition,Pain,Perception,Safety,Sensory,Skin Integrity  OT Balance,Cognition,Endurance,Motor,Edema,Nutrition,Pain,Perception,Safety,Sensory,Skin Integrity,Vision  SLP Perception,Safety,Cognition  TR         Basic ADL's: OT Grooming,Bathing,Dressing,Toileting,Eating     Advanced  ADL's: OT       Transfers: PT Bed Mobility,Bed to Chair,Car,Furniture,Floor  OT Toilet     Locomotion: PT Ambulation,Wheelchair Mobility,Stairs     Additional Impairments: OT Fuctional Use of Upper Extremity  SLP Social Cognition   Social Interaction,Attention,Memory,Problem Solving,Awareness  TR      Anticipated Outcomes Item Anticipated Outcome  Self Feeding s  Swallowing  supervision   Basic self-care  MIN A shirt; MOD A LB  Toileting  MAX A toilet; MOD A bathe   Bathroom Transfers MOD A toilet  Bowel/Bladder  manage bowel and bladder with mod I assist  Transfers  Mod assist with LRAD  Locomotion  WC level with min assist at house hold distances.  Communication  min A basic  Cognition  min A basic  Pain  remain free of pain  Safety/Judgment  remain free of injury, prevent falls with cues and reminders   Therapy Plan: PT Intensity: Minimum of 1-2 x/day ,45 to 90 minutes PT Frequency: 5 out of 7 days PT Duration Estimated Length of Stay: 30-34 days OT Intensity: Minimum of 1-2 x/day, 45 to 90 minutes OT Frequency: 5 out of 7 days OT Duration/Estimated Length of Stay: 4-4.5 weeks SLP  Intensity: Minumum of 1-2 x/day, 30 to 90 minutes SLP Frequency: 3 to 5 out of 7 days SLP Duration/Estimated Length of Stay: 3-4 weeks    Team Interventions: Nursing Interventions Medication Management,Discharge Planning,Patient/Family Education,Psychosocial Support,Disease Management/Prevention,Cognitive Remediation/Compensation  PT interventions Balance/vestibular training,Ambulation/gait training,Community reintegration,Cognitive remediation/compensation,Disease management/prevention,Discharge planning,Functional electrical stimulation,DME/adaptive equipment instruction,Neuromuscular re-education,Patient/family education,Pain management,Functional mobility training,Skin care/wound management,Psychosocial support,Stair training,Splinting/orthotics,Therapeutic Exercise,Therapeutic Activities,UE/LE Coordination activities,UE/LE Strength taining/ROM,Wheelchair propulsion/positioning,Visual/perceptual remediation/compensation  OT Interventions Balance/vestibular training,Discharge planning,Functional electrical stimulation,Pain management,Self Care/advanced ADL retraining,Therapeutic Activities,UE/LE Coordination activities,Visual/perceptual remediation/compensation,Therapeutic Exercise,Patient/family education,Skin care/wound managment,Functional mobility training,Disease mangement/prevention,Cognitive remediation/compensation,Community reintegration,DME/adaptive equipment instruction,Neuromuscular re-education,UE/LE Strength taining/ROM,Psychosocial support,Splinting/orthotics,Wheelchair propulsion/positioning  SLP Interventions Environmental controls,Functional tasks,Speech/Language facilitation,Patient/family education,Internal/external aids,Cueing hierarchy,Cognitive remediation/compensation  TR Interventions    SW/CM Interventions Discharge Planning,Psychosocial Support,Patient/Family Education   Barriers to Discharge MD  Medical stability  Nursing      PT Inaccessible home environment,Home  environment access/layout,Incontinence,Neurogenic Bowel & Bladder,Insurance for SNF coverage,Behavior    OT Inaccessible home environment,Decreased caregiver support,Incontinence    SLP Decreased caregiver support    SW Other (comments) language barrier   Team Discharge Planning: Destination: PT-Skilled Nursing Facility (SNF) ,OT- Home , SLP-Home Projected Follow-up: PT-Skilled nursing facility, OT-  Home health OT, SLP-24 hour supervision/assistance,Home Health SLP Projected Equipment Needs: PT-To be determined,Wheelchair cushion (measurements),Wheelchair (measurements), OT- 3 in 1 bedside comode, SLP-None recommended by SLP Equipment Details: PT- , OT-  Patient/family involved in discharge planning: PT- Patient,Family member/caregiver,  OT-Patient,Family member/caregiver, SLP-Family member/caregiver  MD ELOS: 28-32 days. Medical Rehab Prognosis:  Good Assessment: Right-handed male with history of open ureterlithotomy 2005 in [place] as well as left PCNL and laser ureterotomy October 2018 per Dr. [employer], CAD, diabetes mellitus and hypertension with remote tobacco use.  1 level home.  He works at a Education officer, environmental.  Presented 04/06/2020 with altered mental status headache and left-sided weakness.  Blood pressure 170/100.  Cranial CT scan showed a 4.3 x 2.4 x 3.4 cm acute intraparenchymal hemorrhage centered at the right basal ganglia.  Localized mass-effect with up to 5 mm right to left shift.  Admission chemistries unremarkable except hemoglobin A1c 5.8 WBC 15,700.  MRI/MRA showed stable size and morphology of right basal ganglia intraparenchymal hemorrhage with similar surrounding vasogenic edema and regional mass-effect.  Associated 5 mm right to left shift.  No intraventricular extension hydrocephalus or ventricular trapping.  MRA was unremarkable.  Echocardiogram with ejection fraction of 65 to 70% no wall motion abnormalities.  Maintained on Cleviprex for blood pressure control.  Patient was  cleared to begin subcutaneous heparin for DVT prophylaxis 04/06/2020.  Hospital course follow-up urology services 04/08/2020 for evaluation of leaking urine from his incision line which had been intermittent since ureterotomy.  CT abdomen pelvis showed several tiny stones in the distal left ureter.  Moderate left hydroureter and severe left hydronephrosis.  Severe left renal parenchymal atrophy and cortical thinning secondary to chronic hydronephrosis.  Linear band to the proximal left ureter with focal luminal narrowing likely representing adhesion or scarring with 2 punctate nonobstructing left renal inferior pole calculi and fluid cultures were sent and plan Foley catheter tube insertion.  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 Therapy evaluations completed due to patient's weakness and decreased functional mobility. Will set goals for Mod A with PT/OT and Min A with SLP.    Due to the current state of emergency, patients may not be receiving their 3-hours of Medicare-mandated therapy.  See Team Conference Notes for weekly updates to the plan of care

## 2020-04-11 NOTE — Progress Notes (Signed)
PT Cancellation Note  Patient Details Name: Anthony Huber MRN: [id number] DOB: 04/23/1960   Cancelled Treatment:    Reason Eval/Treat Not Completed: Other (comment) - patient to d/c to CIR today, will defer treatment.  [name], PT, DPT Acute Rehabilitation Services  Pager [phone] Office [phone]  [name] 04/11/2020, 10:22 AM

## 2020-04-11 NOTE — TOC Transition Note (Signed)
Transition of Care [facility]) - CM/SW Discharge Note   Patient Details  Name: Earley Grobe MRN: [id number] Date of Birth: Dec 01, 1960  Transition of Care [facility]) CM/SW Contact:  Kermit Balo, RN Phone Number: 04/11/2020, 10:48 AM   Clinical Narrative:    Patient is discharging to CIR today. CM signing off.   Final next level of care: IP Rehab Facility Barriers to Discharge: No Barriers Identified   Patient Goals and CMS Choice        Discharge Placement                       Discharge Plan and Services                                     Social Determinants of Health (SDOH) Interventions     Readmission Risk Interventions No flowsheet data found.

## 2020-04-11 NOTE — Progress Notes (Signed)
Ranelle Oyster, MD  Physician  Physical Medicine and Rehabilitation  Consult Note      Signed  Date of Service:  04/09/2020  5:23 AM      Related encounter: ED to Hosp-Admission (Discharged) from 04/06/2020 in [facility]       Signed      Expand All Collapse All     Show:Clear all Manual[x] Template[] Copied  Added by: Angiulli, Mcarthur Rossetti, PA-C[x] Ranelle Oyster, MD   Hover for details           Physical Medicine and Rehabilitation Consult Reason for Consult: Left side weakness Referring Physician: Dr.Xu     HPI: Anthony Huber is a 60 y.o. right-handed male with history of open ureterlithotomy 2005 in [place] as well as left PCNL and laser ureterotomy October 2018 by Dr. Marlou Porch, CAD, diabetes mellitus and hypertension with remote tobacco abuse.  Per chart review patient lives with spouse independent prior to admission.  1 level home.  He works at a [employer], [employer].  Presented 04/06/2020 with altered mental status, headache and left-sided weakness.  Blood pressure 170/100.  Cranial CT scan showed a 4.3 x 2.4 x 3.4 cm acute intraparenchymal hemorrhage centered at the right basal ganglia.  Localized mass-effect with up to 5 mm right to left shift.  Admission chemistries unremarkable, hemoglobin A1c 5.8, WBC 15,700.  MRI/MRA shows stable size and morphology of right basal ganglia intraparenchymal hemorrhage with similar surrounding vasogenic edema and regional mass-effect.  Associated 5 mm right to left shift.  No intraventricular extension hydrocephalus or ventricular trapping.  MRA was unremarkable.  Echocardiogram with ejection fraction of 65 to 70% no wall motion abnormalities.  Maintained on Cleviprex for blood pressure control.  Patient was cleared to begin subcutaneous heparin for DVT prophylaxis for 04/06/2020.  Hospital course follow-up urology services 04/08/2020 for evaluation of leaking urine from his incision line which had been intermittent since  ureterotomy.  CT abdomen and pelvis showed several tiny stones in the distal left ureter.  Moderate left hydroureter and severe left hydronephrosis.  Severe left renal parenchymal atrophy and cortical thinning secondary to chronic hydronephrosis.  Linear band through the proximal left ureter with focal luminal narrowing likely representing adhesion or scarring.  2 punctate nonobstructing left renal inferior pole calculi and await follow-up plan by urology services.  Therapy evaluations completed due to patient's weakness recommendations of physical medicine rehab consult.     Review of Systems  Constitutional: Negative for chills and fever.  HENT: Negative for hearing loss.   Eyes: Negative for blurred vision and double vision.  Respiratory: Negative for cough and shortness of breath.   Cardiovascular: Negative for chest pain, palpitations and leg swelling.  Gastrointestinal: Positive for constipation. Negative for heartburn, nausea and vomiting.  Genitourinary: Positive for urgency. Negative for dysuria, flank pain and hematuria.  Musculoskeletal: Positive for back pain.  Skin: Negative for rash.  Neurological: Positive for weakness.  All other systems reviewed and are negative.       Past Medical History:  Diagnosis Date  . Coronary artery disease    . Diabetes mellitus without complication (HCC)    . Hypertension           Past Surgical History:  Procedure Laterality Date  . BACK SURGERY      . BLADDER SURGERY      . CORONARY ANGIOPLASTY WITH STENT PLACEMENT      . CYSTOSCOPY WITH STENT PLACEMENT Left 09/05/2016    Procedure: CYSTOSCOPY, URETEROSCOPY;  Surgeon: Crist Fat, MD;  Location: [facility];  Service: Urology;  Laterality: Left;  . IR NEPHROSTOMY PLACEMENT LEFT   09/06/2016  . NEPHROLITHOTOMY Left 09/16/2016    Procedure: NEPHROLITHOTOMY PERCUTANEOUS  LEFT;  Surgeon: Crist Fat, MD;  Location: [facility];  Service: Urology;  Laterality: Left;    No family history on  file. Social History:  reports that he quit smoking about 6 years ago. He has never used smokeless tobacco. He reports that he does not drink alcohol and does not use drugs. Allergies: No Known Allergies       Medications Prior to Admission  Medication Sig Dispense Refill  . ibuprofen (ADVIL) 200 MG tablet Take 200 mg by mouth every 6 (six) hours as needed for headache or moderate pain.      . Pseudoeph-Doxylamine-DM-APAP (NYQUIL PO) Take 1 Dose by mouth daily as needed (sore throat/congestion).      . Pseudoephedrine-APAP-DM (DAYQUIL PO) Take 1 Dose by mouth daily as needed (sore throat/congestion).      . naproxen (NAPROSYN) 500 MG tablet Take 500 mg by mouth 2 (two) times daily as needed for pain. (Patient not taking: No sig reported)   0  . omeprazole (PRILOSEC) 20 MG capsule Take 20 mg by mouth 2 (two) times daily. (Patient not taking: No sig reported)   0  . traMADol (ULTRAM) 50 MG tablet Take 1-2 tablets (50-100 mg total) by mouth every 6 (six) hours as needed for moderate pain. 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 Functional Status:  Mobility: Bed Mobility Overal bed mobility: Needs Assistance Bed Mobility: Supine to Sit Supine to sit: Max assist,+2 for physical assistance General bed mobility comments: Assist for trunk to upright Transfers Overall transfer level: Needs assistance Equipment used: None Transfers: Sit to/from [employer] Sit to Stand: Max assist,+2  physical assistance Squat pivot transfers: Max assist,+2 physical assistance General transfer comment: MaxA + 2 for low pivot transfer to right. Pt then able to perform x 2 stands from chair, one with face to face transfer and one with +2. Left knee block provided. pt able to initiate, but demonstrates left lateral lean. Tactile cues provided for midline positioning and upright posture. Mild-mod left knee buckle   ADL: ADL Overall ADL's : Needs assistance/impaired Eating/Feeding: NPO Grooming: Maximal assistance,Sitting Upper Body Bathing: Maximal assistance,Sitting Lower Body Bathing: Total assistance,Sit to/from stand,Bed level Upper Body Dressing : Maximal assistance,Sitting Lower Body Dressing: Total assistance,Sit to/from stand,Bed level Toilet Transfer: Maximal assistance,+2 for physical assistance,+2 for safety/equipment,Squat-pivot (simulated to recliner) Functional mobility during ADLs: Maximal assistance,+2 for physical assistance,+2 for safety/equipment General ADL Comments: Pt requiring Max-Total A for ADLs due to decreased balance, cognition, and strength   Cognition: Cognition Overall Cognitive Status: Difficult to assess Orientation Level: Oriented to person,Oriented to place,Oriented to time,Disoriented to situation Cognition Arousal/Alertness: Awake/alert Behavior During Therapy: Flat affect Overall Cognitive Status: Difficult to assess Area of Impairment: Following commands,Safety/judgement,Awareness,Orientation Orientation Level: Situation Following Commands: Follows one step commands consistently Safety/Judgement: Decreased awareness of deficits Awareness: Intellectual General Comments: Pt oriented to time and place; not  oriented to situation, stating he was "sick." does not seem to have awareness of left sided deficits and demonstrates neglect Difficult to assess due to: Non-English speaking   Blood pressure (!) 173/91, pulse 88, temperature 97.6 F (36.4 C),  temperature source Oral, resp. rate (!) 31, height 4\' 11"  (1.499 m), weight 51.9 kg, SpO2 98 %. Physical Exam Constitutional:      Comments: lethargic  HENT:     Head: Normocephalic.     Right Ear: External ear normal.     Left Ear: External ear normal.     Nose: Nose normal.  Cardiovascular:     Rate and Rhythm: Normal rate.     Pulses: Normal pulses.  Pulmonary:     Effort: Pulmonary effort is normal.  Abdominal:     Palpations: Abdomen is soft.  Musculoskeletal:     Cervical back: Normal range of motion.  Skin:    General: Skin is warm.  Neurological:     Comments: Asleep. Awakens to verbal stim but not for long. Left facial weakness.  RUE and RLE move freely. LUE and LLE 0/5. Did not withdraw to pain provocation on left.   Psychiatric:     Comments: Pt lethargic        Lab Results Last 24 Hours       Results for orders placed or performed during the hospital encounter of 04/06/20 (from the past 24 hour(s))  CBC     Status: Abnormal    Collection Time: 04/08/20  7:07 AM  Result Value Ref Range    WBC 14.7 (H) 4.0 - 10.5 K/uL    RBC 6.01 (H) 4.22 - 5.81 MIL/uL    Hemoglobin 13.6 13.0 - 17.0 g/dL    HCT 06/08/20 84.6 - 96.2 %    MCV 69.1 (L) 80.0 - 100.0 fL    MCH 22.6 (L) 26.0 - 34.0 pg    MCHC 32.8 30.0 - 36.0 g/dL    RDW 95.2 (H) 84.1 - 15.5 %    Platelets 307 150 - 400 K/uL    nRBC 0.0 0.0 - 0.2 %  Basic metabolic panel     Status: Abnormal    Collection Time: 04/08/20  7:07 AM  Result Value Ref Range    Sodium 139 135 - 145 mmol/L    Potassium 3.9 3.5 - 5.1 mmol/L    Chloride 104 98 - 111 mmol/L    CO2 23 22 - 32 mmol/L    Glucose, Bld 138 (H) 70 - 99 mg/dL    BUN 21 8 - 23 mg/dL    Creatinine, Ser 06/08/20 (H) 0.61 - 1.24 mg/dL    Calcium 8.7 (L) 8.9 - 10.3 mg/dL    GFR, Estimated 4.01 >02 mL/min    Anion gap 12 5 - 15  Triglycerides     Status: Abnormal    Collection Time: 04/08/20  7:07 AM  Result Value Ref Range    Triglycerides 238 (H) <150 mg/dL   Urinalysis, Routine w reflex microscopic Urine, Clean Catch     Status: Abnormal    Collection Time: 04/08/20  2:38 PM  Result Value Ref Range    Color, Urine YELLOW YELLOW    APPearance CLEAR CLEAR    Specific Gravity, Urine 1.015 1.005 - 1.030    pH 5.0 5.0 - 8.0    Glucose, UA NEGATIVE NEGATIVE mg/dL    Hgb urine dipstick SMALL (A) NEGATIVE    Bilirubin Urine NEGATIVE NEGATIVE    Ketones, ur NEGATIVE  NEGATIVE mg/dL    Protein, ur 30 (A) NEGATIVE mg/dL    Nitrite NEGATIVE NEGATIVE    Leukocytes,Ua TRACE (A) NEGATIVE    RBC / HPF 6-10 0 - 5 RBC/hpf    WBC, UA 6-10 0 - 5 WBC/hpf    Bacteria, UA MANY (A) NONE SEEN    Mucus PRESENT    Glucose, capillary     Status: Abnormal    Collection Time: 04/08/20  3:36 PM  Result Value Ref Range    Glucose-Capillary 130 (H) 70 - 99 mg/dL  Glucose, capillary     Status: Abnormal    Collection Time: 04/08/20  9:34 PM  Result Value Ref Range    Glucose-Capillary 121 (H) 70 - 99 mg/dL  CBC     Status: Abnormal    Collection Time: 04/09/20  2:11 AM  Result Value Ref Range    WBC 15.4 (H) 4.0 - 10.5 K/uL    RBC 6.02 (H) 4.22 - 5.81 MIL/uL    Hemoglobin 13.3 13.0 - 17.0 g/dL    HCT 40.9 81.1 - 91.4 %    MCV 66.4 (L) 80.0 - 100.0 fL    MCH 22.1 (L) 26.0 - 34.0 pg    MCHC 33.3 30.0 - 36.0 g/dL    RDW 78.2 95.6 - 21.3 %    Platelets 287 150 - 400 K/uL    nRBC 0.0 0.0 - 0.2 %  Basic metabolic panel     Status: Abnormal    Collection Time: 04/09/20  2:11 AM  Result Value Ref Range    Sodium 135 135 - 145 mmol/L    Potassium 3.8 3.5 - 5.1 mmol/L    Chloride 104 98 - 111 mmol/L    CO2 20 (L) 22 - 32 mmol/L    Glucose, Bld 121 (H) 70 - 99 mg/dL    BUN 19 8 - 23 mg/dL    Creatinine, Ser 0.86 0.61 - 1.24 mg/dL    Calcium 8.7 (L) 8.9 - 10.3 mg/dL    GFR, Estimated >57 >84 mL/min    Anion gap 11 5 - 15       Imaging Results (Last 48 hours)  CT HEAD WO CONTRAST   Result Date: 04/07/2020 CLINICAL DATA:  60 year old male code stroke  presentation yesterday with acute right basal ganglia hemorrhage. Subsequent encounter. EXAM: CT HEAD WITHOUT CONTRAST TECHNIQUE: Contiguous axial images were obtained from the base of the skull through the vertex without intravenous contrast. COMPARISON:  Head CT 2312 hours yesterday. FINDINGS: Brain: Oval hyperdense hemorrhage centered at the posterior right lentiform encompasses 54 x 21 x 39 mm (AP by transverse by CC) for an estimated intra-axial blood volume of 22 mL but not significantly changed when measured in the same way last night (52 by 24 by 36 mm last night). Surrounding edema has not significantly changed. No extra-axial or intraventricular extension has developed. There is mass effect on the right lateral ventricle with mild leftward midline shift of 2-3 mm, stable. Basilar cisterns remain normal. Stable gray-white matter differentiation elsewhere. Vascular: No suspicious intracranial vascular hyperdensity. Skull: Poor left maxillary posterior dentition. No acute osseous abnormality identified. Sinuses/Orbits: Visualized paranasal sinuses and mastoids are stable and well pneumatized. Other: Visualized orbits and scalp soft tissues are within normal limits. IMPRESSION: 1. Acute right basal ganglia hemorrhage not significantly changed since last night, estimated blood volume 22 mL. No intraventricular or extra-axial extension. Stable surrounding edema and mild regional mass effect. 2. No new intracranial abnormality. Electronically Signed  By: Odessa Fleming M.D.   On: 04/07/2020 11:31    MR ANGIO HEAD WO CONTRAST   Result Date: 04/08/2020 CLINICAL DATA:  Follow-up examination for acute intracranial hemorrhage. EXAM: MRI HEAD WITHOUT CONTRAST MRA HEAD WITHOUT CONTRAST TECHNIQUE: Multiplanar, multiecho pulse sequences of the brain and surrounding structures were obtained without intravenous contrast. Angiographic images of the head were obtained using MRA technique without contrast. COMPARISON:  Prior CTs  from 04/07/2020 and 04/06/2020 FINDINGS: MRI HEAD FINDINGS Brain: Examination mildly degraded by motion artifact. Cerebral volume within normal limits for age. Patchy T2/FLAIR hyperintensity within the periventricular and deep white matter both cerebral hemispheres most consistent with chronic small vessel ischemic disease. Previously identified intraparenchymal hemorrhage centered at the right basal ganglia again seen, not significantly changed in size and morphology measuring 4.9 x 1.9 x 3.5 cm (estimated volume 18 mL). Similar surrounding vasogenic edema with regional mass effect. Partial effacement of the right lateral ventricle, similar. Associated right-to-left shift of up to approximately 5 mm also relatively unchanged. No intraventricular extension. Basilar cisterns remain patent. No appreciable underlying lesion on this noncontrast examination. No other evidence for acute or chronic intracranial hemorrhage. No other diffusion abnormality to suggest acute or subacute ischemia. Gray-white matter differentiation otherwise maintained. No encephalomalacia to suggest chronic cortical infarction elsewhere within the brain. No mass lesion or extra-axial fluid collection. No hydrocephalus or ventricular trapping. No made of a partially empty sella. Midline structures intact. Vascular: Major intracranial vascular flow voids are maintained. Skull and upper cervical spine: Craniocervical junction within normal limits. Bone marrow signal intensity normal. No scalp soft tissue abnormality. Sinuses/Orbits: Globes and orbital soft tissues within normal limits. Mild scattered mucosal thickening noted within the ethmoidal air cells. Paranasal sinuses are otherwise largely clear. No significant mastoid effusion. Inner ear structures grossly normal. Other: None. MRA HEAD FINDINGS ANTERIOR CIRCULATION: Visualized distal cervical segments of the internal carotid arteries are mildly tortuous but patent with antegrade flow.  Petrous, cavernous, and supraclinoid segments widely patent without stenosis or other abnormality. A1 segments widely patent. Normal anterior communicating artery complex. Anterior cerebral arteries widely patent to their distal aspects without stenosis. No M1 stenosis or occlusion. Normal MCA bifurcations. Distal MCA branches well perfused and symmetric. POSTERIOR CIRCULATION: Dominant right V4 segment widely patent to the vertebrobasilar junction. Left vertebral artery hypoplastic and terminates in PICA. Both PICA origins patent and normal. Focal moderate stenosis noted involving the mid basilar artery (series 1154, image 8). Basilar otherwise patent to its distal aspect. Superior cerebellar arteries patent bilaterally. Left PCA supplied via the basilar as well as a prominent left posterior communicating artery. Fetal type origin of the right PCA. Both PCAs well perfused to their distal aspects without stenosis. No intracranial aneurysm. No other vascular abnormality seen underlying the acute right basal ganglia hemorrhage. IMPRESSION: MRI HEAD IMPRESSION: 1. Stable size and morphology of right basal ganglia intraparenchymal hemorrhage with similar surrounding vasogenic edema and regional mass effect. Associated 5 mm right-to-left shift, stable. No intraventricular extension, hydrocephalus, or ventricular trapping. No appreciable underlying lesion on this noncontrast examination. 2. Underlying mild chronic microvascular ischemic disease. MRA HEAD IMPRESSION: 1. Negative intracranial MRA. No intracranial aneurysm or other vascular abnormality seen underlying the right basal ganglia hemorrhage. No large vessel occlusion. 2. 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 No other hemodynamically significant stenosis. 3. 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 Fetal type origin of the right PCA. Electronically Signed   By: Rise Mu M.D.   On: 04/08/2020 03:19  MR BRAIN WO  CONTRAST   Result Date: 04/08/2020 CLINICAL DATA:  Follow-up examination for acute intracranial hemorrhage. EXAM: MRI HEAD WITHOUT CONTRAST MRA HEAD WITHOUT CONTRAST TECHNIQUE: Multiplanar, multiecho pulse sequences of the brain and surrounding structures were obtained without intravenous contrast. Angiographic images of the head were obtained using MRA technique without contrast. COMPARISON:  Prior CTs from 04/07/2020 and 04/06/2020 FINDINGS: MRI HEAD FINDINGS Brain: Examination mildly degraded by motion artifact. Cerebral volume within normal limits for age. Patchy T2/FLAIR hyperintensity within the periventricular and deep white matter both cerebral hemispheres most consistent with chronic small vessel ischemic disease. Previously identified intraparenchymal hemorrhage centered at the right basal ganglia again seen, not significantly changed in size and morphology measuring 4.9 x 1.9 x 3.5 cm (estimated volume 18 mL). Similar surrounding vasogenic edema with regional mass effect. Partial effacement of the right lateral ventricle, similar. Associated right-to-left shift of up to approximately 5 mm also relatively unchanged. No intraventricular extension. Basilar cisterns remain patent. No appreciable underlying lesion on this noncontrast examination. No other evidence for acute or chronic intracranial hemorrhage. No other diffusion abnormality to suggest acute or subacute ischemia. Gray-white matter differentiation otherwise maintained. No encephalomalacia to suggest chronic cortical infarction elsewhere within the brain. No mass lesion or extra-axial fluid collection. No hydrocephalus or ventricular trapping. No made of a partially empty sella. Midline structures intact. Vascular: Major intracranial vascular flow voids are maintained. Skull and upper cervical spine: Craniocervical junction within normal limits. Bone marrow signal intensity normal. No scalp soft tissue abnormality. Sinuses/Orbits: Globes and  orbital soft tissues within normal limits. Mild scattered mucosal thickening noted within the ethmoidal air cells. Paranasal sinuses are otherwise largely clear. No significant mastoid effusion. Inner ear structures grossly normal. Other: None. MRA HEAD FINDINGS ANTERIOR CIRCULATION: Visualized distal cervical segments of the internal carotid arteries are mildly tortuous but patent with antegrade flow. Petrous, cavernous, and supraclinoid segments widely patent without stenosis or other abnormality. A1 segments widely patent. Normal anterior communicating artery complex. Anterior cerebral arteries widely patent to their distal aspects without stenosis. No M1 stenosis or occlusion. Normal MCA bifurcations. Distal MCA branches well perfused and symmetric. POSTERIOR CIRCULATION: Dominant right V4 segment widely patent to the vertebrobasilar junction. Left vertebral artery hypoplastic and terminates in PICA. Both PICA origins patent and normal. Focal moderate stenosis noted involving the mid basilar artery (series 1154, image 8). Basilar otherwise patent to its distal aspect. Superior cerebellar arteries patent bilaterally. Left PCA supplied via the basilar as well as a prominent left posterior communicating artery. Fetal type origin of the right PCA. Both PCAs well perfused to their distal aspects without stenosis. No intracranial aneurysm. No other vascular abnormality seen underlying the acute right basal ganglia hemorrhage. IMPRESSION: MRI HEAD IMPRESSION: 1. Stable size and morphology of right basal ganglia intraparenchymal hemorrhage with similar surrounding vasogenic edema and regional mass effect. Associated 5 mm right-to-left shift, stable. No intraventricular extension, hydrocephalus, or ventricular trapping. No appreciable underlying lesion on this noncontrast examination. 2. Underlying mild chronic microvascular ischemic disease. MRA HEAD IMPRESSION: 1. Negative intracranial MRA. No intracranial aneurysm or  other vascular abnormality seen underlying the right basal ganglia hemorrhage. No large vessel occlusion. 2. 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 No other hemodynamically significant stenosis. 3. 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 Fetal type origin of the right PCA. Electronically Signed   By: Rise MuBenjamin  McClintock M.D.   On: 04/08/2020 03:19    DG CHEST PORT 1 VIEW   Result Date: 04/08/2020 CLINICAL  DATA:  Stroke EXAM: PORTABLE CHEST 1 VIEW COMPARISON:  None. FINDINGS: The heart size and mediastinal contours are within normal limits. Prominence of the central pulmonary vasculature is seen. The visualized skeletal structures are unremarkable. IMPRESSION: Mild pulmonary vascular congestion.  For Electronically Signed   By: Jonna Clark M.D.   On: 04/08/2020 15:03    ECHOCARDIOGRAM COMPLETE   Result Date: 04/08/2020    ECHOCARDIOGRAM REPORT   Patient Name:   [facility]  Date of Exam: 04/08/2020 Medical Rec #:  [id number]  Height:       59.0 in Accession #:    [id number] Weight:       114.5 lb Date of Birth:  1960/11/18  BSA:          1.455 m Patient Age:    60 years   BP:           126/94 mmHg Patient Gender: M          HR:           117 bpm. Exam Location:  Inpatient Procedure: 2D Echo, Cardiac Doppler and Color Doppler Indications:    Stroke I63.9  History:        Patient has no prior history of Echocardiogram examinations.                 CAD; Risk Factors:Hypertension and Diabetes.  Sonographer:    Eulah Pont RDCS Referring Phys: [phone] MCNEILL P KIRKPATRICK IMPRESSIONS  1. Left ventricular ejection fraction, by estimation, is 65 to 70%. The left ventricle has normal function. The left ventricle has no regional wall motion abnormalities. Left ventricular diastolic parameters were normal.  2. Right ventricular systolic function is normal. The right ventricular size is normal. There is mildly elevated pulmonary artery systolic pressure.  3. The mitral valve is normal in  structure. Trivial mitral valve regurgitation. No evidence of mitral stenosis.  4. The aortic valve is normal in structure. Aortic valve regurgitation is not visualized. No aortic stenosis is present.  5. The inferior vena cava is normal in size with greater than 50% respiratory variability, suggesting right atrial pressure of 3 mmHg. FINDINGS  Left Ventricle: Left ventricular ejection fraction, by estimation, is 65 to 70%. The left ventricle has normal function. The left ventricle has no regional wall motion abnormalities. The left ventricular internal cavity size was normal in size. There is  no left ventricular hypertrophy. Left ventricular diastolic parameters were normal. Right Ventricle: The right ventricular size is normal. No increase in right ventricular wall thickness. Right ventricular systolic function is normal. There is mildly elevated pulmonary artery systolic pressure. The tricuspid regurgitant velocity is 2.93  m/s, and with an assumed right atrial pressure of 3 mmHg, the estimated right ventricular systolic pressure is 37.3 mmHg. Left Atrium: Left atrial size was normal in size. Right Atrium: Right atrial size was normal in size. Pericardium: There is no evidence of pericardial effusion. Mitral Valve: The mitral valve is normal in structure. Trivial mitral valve regurgitation. No evidence of mitral valve stenosis. Tricuspid Valve: The tricuspid valve is normal in structure. Tricuspid valve regurgitation is trivial. No evidence of tricuspid stenosis. Aortic Valve: The aortic valve is normal in structure. Aortic valve regurgitation is not visualized. No aortic stenosis is present. Pulmonic Valve: The pulmonic valve was normal in structure. Pulmonic valve regurgitation is not visualized. No evidence of pulmonic stenosis. Aorta: The aortic root is normal in size and structure. Venous: The inferior vena cava is normal in size with  greater than 50% respiratory variability, suggesting right atrial  pressure of 3 mmHg. IAS/Shunts: The interatrial septum was not well visualized.  LEFT VENTRICLE PLAX 2D LVIDd:         4.30 cm  Diastology LVIDs:         2.60 cm  LV e' medial:    8.16 cm/s LV PW:         0.90 cm  LV E/e' medial:  10.8 LV IVS:        0.90 cm  LV e' lateral:   10.60 cm/s LVOT diam:     1.90 cm  LV E/e' lateral: 8.3 LV SV:         54 LV SV Index:   37 LVOT Area:     2.84 cm  RIGHT VENTRICLE RV S prime:     17.20 cm/s TAPSE (M-mode): 2.3 cm LEFT ATRIUM             Index       RIGHT ATRIUM           Index LA diam:        2.90 cm 1.99 cm/m  RA Area:     10.60 cm LA Vol (A2C):   20.5 ml 14.09 ml/m RA Volume:   22.20 ml  15.26 ml/m LA Vol (A4C):   22.0 ml 15.12 ml/m LA Biplane Vol: 22.6 ml 15.53 ml/m  AORTIC VALVE LVOT Vmax:   115.00 cm/s LVOT Vmean:  82.000 cm/s LVOT VTI:    0.189 m  AORTA Ao Root diam: 3.10 cm Ao Asc diam:  3.00 cm MITRAL VALVE                TRICUSPID VALVE MV Area (PHT): 3.60 cm     TR Peak grad:   34.3 mmHg MV Decel Time: 211 msec     TR Vmax:        293.00 cm/s MV E velocity: 88.30 cm/s MV A velocity: 100.00 cm/s  SHUNTS MV E/A ratio:  0.88         Systemic VTI:  0.19 m                             Systemic Diam: 1.90 cm Charlton Haws MD Electronically signed by Charlton Haws MD Signature Date/Time: 04/08/2020/10:08:03 AM    Final     CT HEMATURIA WORKUP   Result Date: 04/08/2020 CLINICAL DATA:  60 year old male with hematuria. EXAM: CT ABDOMEN AND PELVIS WITHOUT AND WITH CONTRAST TECHNIQUE: Multidetector CT imaging of the abdomen and pelvis was performed following the standard protocol before and following the bolus administration of intravenous contrast. CONTRAST:  OMNIPAQUE IOHEXOL 300 MG/ML  SOLN COMPARISON:  CT abdomen pelvis dated 09/17/2016. FINDINGS: Evaluation of this exam is limited due to motion artifact as well as streak artifact caused by patient's arms. Lower chest: Mild interstitial coarsening and right lung base atelectasis. The visualized lung bases are  otherwise clear. No intra-abdominal free air or free fluid. Hepatobiliary: The liver is unremarkable. No intrahepatic biliary ductal dilatation. The gallbladder is unremarkable. Pancreas: Unremarkable. No pancreatic ductal dilatation or surrounding inflammatory changes. Spleen: Normal in size without focal abnormality. Adrenals/Urinary Tract: The adrenal glands unremarkable. There is a small stone or 2 adjacent stones with combined length of approximately 5 mm in the distal left ureter additional punctate focus may be present in the distal left ureter more distally. There is moderate left hydroureter with severe left  hydronephrosis. There is severe left renal parenchyma atrophy and cortical thinning secondary to chronic hydronephrosis. Two punctate nonobstructing left renal inferior pole calculi noted. No obvious enhancing lesion identified. Evaluation however is very limited due to respiratory motion artifact. There is a linear band through the proximal left ureter with focal luminal narrowing likely representing adhesion or scarring. This can be better evaluated with direct visualization and scoping. The right kidney is unremarkable. Thin linear band noted in the proximal right ureter (coronal 39/16 and axial 39/13) likely adhesion or scarring. The urinary bladder appears unremarkable. Stomach/Bowel: There is moderate stool throughout the colon. There is no bowel obstruction or active inflammation. The appendix is normal. Vascular/Lymphatic: Moderate aortoiliac atherosclerotic disease. The IVC is unremarkable. No portal venous gas. There is no adenopathy. Reproductive: The prostate and seminal vesicles are grossly unremarkable. No pelvic mass. Other: None Musculoskeletal: Degenerative changes of the spine. No acute osseous pathology. IMPRESSION: 1. Several tiny stones in the distal left ureter. There is moderate left hydroureter and severe left hydronephrosis. 2. Severe left renal parenchyma atrophy and cortical  thinning secondary to chronic hydronephrosis. 3. Linear band through the proximal left ureter with focal luminal narrowing likely representing adhesion or scarring. This can be better evaluated with direct visualization and scoping. 4. Two punctate nonobstructing left renal inferior pole calculi. 5. No bowel obstruction. Normal appendix. 6. Aortic Atherosclerosis (ICD10-I70.0). Electronically Signed   By: Elgie Collard M.D.   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 Does the need for close, 24 hr/day medical supervision in concert with the patient's rehab needs make it unreasonable for this patient to be served in a less intensive setting? Yes 2. Co-Morbidities requiring supervision/potential complications: HTN, hx of 3. Due to bladder management, bowel management, safety, skin/wound care, disease management, medication administration, pain management and patient education, does the patient require 24 hr/day rehab nursing? Yes 4. Does the patient require coordinated care of a physician, rehab nurse, therapy disciplines of PT, OT, SLP to address physical and functional deficits in the context of the above medical diagnosis(es)? Yes Addressing deficits in the following areas: balance, endurance, locomotion, strength, transferring, bowel/bladder control, bathing, dressing, feeding, grooming, toileting, cognition, speech, swallowing and psychosocial support 5. Can the patient actively participate in an intensive therapy program of at least 3 hrs of therapy per day at least 5 days per week? Yes 6. The potential for patient to make measurable gains while on inpatient rehab is good 7. Anticipated functional outcomes upon discharge from inpatient rehab are supervision and min assist  with PT, supervision and min assist with OT, supervision and min assist with SLP. 8. Estimated rehab length of stay to reach the above  functional goals is: 20-28 days 9. Anticipated discharge destination: Home 10. Overall Rehab/Functional Prognosis: excellent   RECOMMENDATIONS: This patient's condition is appropriate for continued rehabilitative care in the following setting: CIR Patient has agreed to participate in recommended program. N/A Note that insurance prior authorization may be required for reimbursement for recommended care.   Comment: Spoke with son in front of wife. Both are supportive of an inpatient rehab admission. Wife will be main caregiver at home. Rehab Admissions Coordinator to follow up.   Thanks,   Ranelle Oyster, MD, [place] [facility]   I have personally performed a face to face diagnostic evaluation of this patient. Additionally, I have examined pertinent labs and radiographic images. I have reviewed and concur with  the physician assistant's documentation above.     Charlton Amor, PA-C 04/09/2020          Revision History                        Routing History                   Note Details  Author Ranelle Oyster, MD File Time 04/09/2020  2:38 PM  Author Type Physician Status Signed  Last Editor Ranelle Oyster, MD Service Physical Medicine and Rehabilitation  [facility] Acct # [id number] Admit Date 04/11/2020

## 2020-04-12 DIAGNOSIS — D72829 Elevated white blood cell count, unspecified: Secondary | ICD-10-CM

## 2020-04-12 DIAGNOSIS — K5901 Slow transit constipation: Secondary | ICD-10-CM

## 2020-04-12 DIAGNOSIS — M1A0610 Idiopathic chronic gout, right knee, without tophus (tophi): Secondary | ICD-10-CM

## 2020-04-12 DIAGNOSIS — D62 Acute posthemorrhagic anemia: Secondary | ICD-10-CM

## 2020-04-12 DIAGNOSIS — I610 Nontraumatic intracerebral hemorrhage in hemisphere, subcortical: Secondary | ICD-10-CM | POA: Diagnosis not present

## 2020-04-12 LAB — COMPREHENSIVE METABOLIC PANEL
ALT: 9 U/L (ref 0–44)
AST: 22 U/L (ref 15–41)
Albumin: 2.5 g/dL — ABNORMAL LOW (ref 3.5–5.0)
Alkaline Phosphatase: 52 U/L (ref 38–126)
Anion gap: 11 (ref 5–15)
BUN: 24 mg/dL — ABNORMAL HIGH (ref 8–23)
CO2: 19 mmol/L — ABNORMAL LOW (ref 22–32)
Calcium: 8.3 mg/dL — ABNORMAL LOW (ref 8.9–10.3)
Chloride: 106 mmol/L (ref 98–111)
Creatinine, Ser: 1.21 mg/dL (ref 0.61–1.24)
GFR, Estimated: >60 mL/min (ref >60)
Glucose, Bld: 107 mg/dL — ABNORMAL HIGH (ref 70–99)
Potassium: 3.8 mmol/L (ref 3.5–5.1)
Sodium: 136 mmol/L (ref 135–145)
Total Bilirubin: 0.7 mg/dL (ref 0.3–1.2)
Total Protein: 6.7 g/dL (ref 6.5–8.1)

## 2020-04-12 LAB — GLUCOSE, CAPILLARY
Glucose-Capillary: 115 mg/dL — ABNORMAL HIGH (ref 70–99)
Glucose-Capillary: 130 mg/dL — ABNORMAL HIGH (ref 70–99)
Glucose-Capillary: 131 mg/dL — ABNORMAL HIGH (ref 70–99)
Glucose-Capillary: 160 mg/dL — ABNORMAL HIGH (ref 70–99)

## 2020-04-12 LAB — CBC WITH DIFFERENTIAL/PLATELET
Abs Immature Granulocytes: 0.05 10*3/uL (ref 0.00–0.07)
Basophils Absolute: 0 10*3/uL (ref 0.0–0.1)
Basophils Relative: 0 %
Eosinophils Absolute: 0.4 10*3/uL (ref 0.0–0.5)
Eosinophils Relative: 4 %
HCT: 36.8 % — ABNORMAL LOW (ref 39.0–52.0)
Hemoglobin: 12 g/dL — ABNORMAL LOW (ref 13.0–17.0)
Immature Granulocytes: 0 %
Lymphocytes Relative: 15 %
Lymphs Abs: 1.8 10*3/uL (ref 0.7–4.0)
MCH: 22.1 pg — ABNORMAL LOW (ref 26.0–34.0)
MCHC: 32.6 g/dL (ref 30.0–36.0)
MCV: 67.8 fL — ABNORMAL LOW (ref 80.0–100.0)
Monocytes Absolute: 1.4 10*3/uL — ABNORMAL HIGH (ref 0.1–1.0)
Monocytes Relative: 12 %
Neutro Abs: 7.9 10*3/uL — ABNORMAL HIGH (ref 1.7–7.7)
Neutrophils Relative %: 69 %
Platelets: 284 10*3/uL (ref 150–400)
RBC: 5.43 MIL/uL (ref 4.22–5.81)
RDW: 14.8 % (ref 11.5–15.5)
WBC: 11.6 10*3/uL — ABNORMAL HIGH (ref 4.0–10.5)
nRBC: 0 % (ref 0.0–0.2)

## 2020-04-12 MED ORDER — CHLORHEXIDINE GLUCONATE CLOTH 2 % EX PADS
6.0000 | MEDICATED_PAD | Freq: Every day | CUTANEOUS | Status: DC
  Administered 2020-04-13 – 2020-04-25 (×12): 6 via TOPICAL

## 2020-04-12 MED ORDER — COLCHICINE 0.6 MG PO TABS
0.6000 mg | ORAL_TABLET | Freq: Every day | ORAL | Status: DC
  Administered 2020-04-12 – 2020-04-18 (×7): 0.6 mg via ORAL
  Filled 2020-04-12 (×7): qty 1

## 2020-04-12 MED ORDER — POLYETHYLENE GLYCOL 3350 17 G PO PACK
17.0000 g | PACK | Freq: Every day | ORAL | Status: DC
  Administered 2020-04-14 – 2020-05-17 (×32): 17 g via ORAL
  Filled 2020-04-12 (×35): qty 1

## 2020-04-12 MED ORDER — ATORVASTATIN CALCIUM 40 MG PO TABS
40.0000 mg | ORAL_TABLET | Freq: Every day | ORAL | Status: DC
  Administered 2020-04-12 – 2020-04-20 (×9): 40 mg via ORAL
  Filled 2020-04-12 (×4): qty 1
  Filled 2020-04-12: qty 4
  Filled 2020-04-12 (×4): qty 1

## 2020-04-12 NOTE — Progress Notes (Signed)
Inpatient Rehabilitation Care Coordinator Assessment and Plan Patient Details  Name: Anthony Huber MRN: [id number] Date of Birth: 1960/03/25  Today's Date: 04/12/2020  Hospital Problems: Principal Problem:   ICH (intracerebral hemorrhage) (HCC) Active Problems:   Slow transit constipation   Chronic gout of right knee   Acute blood loss anemia   Leukocytosis  Past Medical History:  Past Medical History:  Diagnosis Date  . Coronary artery disease   . Diabetes mellitus without complication (HCC)   . Hypertension    Past Surgical History:  Past Surgical History:  Procedure Laterality Date  . BACK SURGERY    . BLADDER SURGERY    . CORONARY ANGIOPLASTY WITH STENT PLACEMENT    . CYSTOSCOPY WITH STENT PLACEMENT Left 09/05/2016   Procedure: CYSTOSCOPY, URETEROSCOPY;  Surgeon: Crist Fat, MD;  Location: [facility];  Service: Urology;  Laterality: Left;  . IR NEPHROSTOMY PLACEMENT LEFT  09/06/2016  . NEPHROLITHOTOMY Left 09/16/2016   Procedure: NEPHROLITHOTOMY PERCUTANEOUS  LEFT;  Surgeon: Crist Fat, MD;  Location: [facility];  Service: Urology;  Laterality: Left;   Social History:  reports that he quit smoking about 6 years ago. He has never used smokeless tobacco. He reports that he does not drink alcohol and does not use drugs.  Family / Support Systems Marital Status: Married Patient Roles: Spouse,Parent,Other (Comment) (employee) Spouse/Significant Other: Wife will be present daily but does not speak any English Children: Anthony Huber [phone] Anticipated Caregiver: Wife and son Ability/Limitations of Caregiver: Wife is not employed-son works days Medical laboratory scientific officer: 24/7 Family Dynamics: Close knit with son and his family they all will pull together to assist pt and provide his care. 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 He has taken care of wife and fiances-provider for his family. Son reports he is one who does for himself. Recent Psychosocial Issues: HTN was not seeing a MD-ureterotomy in 2005 & 2018 bladder issues Psychiatric History: No history deferred depression screen due to adjusting to new unit and exhaustion from therapies. Will place on list to be seen further in his stay. Substance Abuse History: No issues  Patient / Family Perceptions, Expectations & Goals Pt/Family understanding of illness & functional limitations: Wife and son are able to explain pt's bleed and deficits from this. Their son talks with the MD and feels his questions are being answered and addressed. Premorbid pt/family roles/activities: Husband, father, employee, friend, etc Anticipated changes in roles/activities/participation: resume Pt/family expectations/goals: Wife states: " I want him better."  Son states: " We hope he does well here and my Mom will be caring for him while I work."  [employer]: None Premorbid Home Care/DME Agencies: None Transportation available at discharge: Son Resource referrals recommended:  Neuropsychology  Discharge Planning Living  Arrangements: Spouse/significant other,Children Support Systems: Spouse/significant other,Children,Friends/neighbors,Church/faith community Type of Residence: Private residence [employer] Resources: Media planner (specify) Herbalist) Financial Resources: Science writer Screen Referred: No Living Expenses: Rent Money Management: Patient Does the patient have any problems obtaining your medications?: No (was not seeing a MD prior to admission) Home Management: Wife Patient/Family Preliminary Plans: Return home with wife and son who can assist. Son works during the day so wife will be his primary caregiver. All hopeful he will do well here and recover from this stroke. Aware team evaluating him today and setting goals along with target DC date. Care Coordinator Barriers to Discharge: Other (comments) Care Coordinator Barriers to Discharge Comments: language barrier Care Coordinator Anticipated Follow Up Needs: HH/OP  Clinical Impression Pleasant family and pt who is willing to do what he can to recover and regain his independence while here. Spoke with son via telephone to introduce self and let know team conference on Wed. Spoke with pt and wife with interpreter present to introduce self and discuss same topics discussed with son. Made aware he may require physical care at DC. Both feel can provide this. Will work on discharge needs.  Lucy Chris 04/12/2020, 1:22 PM

## 2020-04-12 NOTE — Evaluation (Signed)
Physical Therapy Assessment and Plan  Patient Details  Name: Anthony Huber MRN: [id number] Date of Birth: Oct 31, 1960  PT Diagnosis: Abnormal posture, Abnormality of gait, Coordination disorder, Difficulty walking, Dizziness and giddiness, Hemiplegia non-dominant, Impaired cognition, Impaired sensation and Muscle spasms Rehab Potential: Fair ELOS: 30-34 days   Today's Date: 04/12/2020 PT Individual Time: 1120-1205 PT Individual Time Calculation (min): 45 min    Hospital Problem: Principal Problem:   ICH (intracerebral hemorrhage) ([place]) Active Problems:   Slow transit constipation   Chronic gout of right knee   Acute blood loss anemia   Leukocytosis   Past Medical History:  Past Medical History:  Diagnosis Date  . Coronary artery disease   . Diabetes mellitus without complication ([place])   . Hypertension    Past Surgical History:  Past Surgical History:  Procedure Laterality Date  . BACK SURGERY    . BLADDER SURGERY    . CORONARY ANGIOPLASTY WITH STENT PLACEMENT    . CYSTOSCOPY WITH STENT PLACEMENT Left 09/05/2016   Procedure: CYSTOSCOPY, URETEROSCOPY;  Surgeon: Ardis Hughs, MD;  Location: [facility];  Service: Urology;  Laterality: Left;  . IR NEPHROSTOMY PLACEMENT LEFT  09/06/2016  . NEPHROLITHOTOMY Left 09/16/2016   Procedure: NEPHROLITHOTOMY PERCUTANEOUS  LEFT;  Surgeon: Ardis Hughs, MD;  Location: [facility];  Service: Urology;  Laterality: Left;    Assessment & Plan Clinical Impression: Patient is a 60 year old right-handed male with history of open ureterlithotomy2005 in [place] as well as left PCNL and laser ureterotomyOctober 2018 per Dr. Louis Meckel, CAD, diabetes mellitus and hypertension with remote tobacco use. Per chart review lives with spouse independent prior to admission. 1 level home. He works at a [employer], [employer]. Presented 04/06/2020 with altered mental status headache and left-sided weakness. Blood pressure 170/100. Cranial CT scan showed a 4.3 x 2.4 x 3.4 cm  acute intraparenchymal hemorrhage centered at the right basal ganglia. Localized mass-effect with up to 5 mm right to left shift. Admission chemistries unremarkable except hemoglobin A1c 5.8 WBC 15,700. MRI/MRA showed stable size and morphology of right basal ganglia intraparenchymal hemorrhage with similar surrounding vasogenic edema and regional mass-effect. Associated 5 mm right to left shift. No intraventricular extension hydrocephalus or ventricular trapping. MRA was unremarkable. Echocardiogram with ejection fraction of 65 to 70% no wall motion abnormalities.  Patient transferred to CIR on 04/11/2020 .   Patient currently requires total with mobility secondary to muscle weakness, muscle joint tightness and muscle paralysis, decreased cardiorespiratoy endurance, impaired timing and sequencing, abnormal tone, unbalanced muscle activation, motor apraxia, ataxia, decreased coordination and decreased motor planning, decreased visual acuity, decreased visual perceptual skills and field cut, decreased attention to left, decreased initiation, decreased attention, decreased awareness, decreased problem solving, decreased safety awareness, decreased memory and delayed processing and decreased sitting balance, decreased standing balance, decreased postural control, hemiplegia, decreased balance strategies and difficulty maintaining precautions.  Prior to hospitalization, patient was independent  with mobility and lived with Spouse in a House home.  Home access is 2Stairs to enter.  Patient will benefit from skilled PT intervention to maximize safe functional mobility, minimize fall risk and decrease caregiver burden for planned discharge SNF.  Anticipate patient will benefit from SNF placement  at discharge.  PT - End of Session Activity Tolerance: Tolerates < 10 min activity, no significant change in vital signs Endurance Deficit: Yes PT Assessment Rehab Potential (ACUTE/IP ONLY): Fair PT Barriers to  Discharge: [place] home environment;Home environment access/layout;Incontinence;Neurogenic Bowel & Bladder;Insurance for SNF coverage;Behavior PT Patient demonstrates impairments in the  following area(s): Balance;Behavior;Edema;Endurance;Motor;Nutrition;Pain;Perception;Safety;Sensory;Skin Integrity PT Transfers Functional Problem(s): Bed Mobility;Bed to Chair;Car;Furniture;Floor PT Locomotion Functional Problem(s): Ambulation;Wheelchair Mobility;Stairs PT Plan PT Intensity: Minimum of 1-2 x/day ,45 to 90 minutes PT Frequency: 5 out of 7 days PT Duration Estimated Length of Stay: 30-34 days PT Treatment/Interventions: Training and development officer;Ambulation/gait training;Community reintegration;Cognitive remediation/compensation;Disease management/prevention;Discharge planning;Functional electrical stimulation;DME/adaptive equipment instruction;Neuromuscular re-education;Patient/family education;Pain management;Functional mobility training;Skin care/wound management;Psychosocial support;Stair training;Splinting/orthotics;Therapeutic Exercise;Therapeutic Activities;UE/LE Coordination activities;UE/LE Strength taining/ROM;Wheelchair propulsion/positioning;Visual/perceptual remediation/compensation PT Transfers Anticipated Outcome(s): Mod assist with LRAD PT Locomotion Anticipated Outcome(s): WC level with min assist at house hold distances. PT Recommendation Follow Up Recommendations: Skilled nursing facility Patient destination: [place] (SNF) Equipment Recommended: To be determined;Wheelchair cushion (measurements);Wheelchair (measurements)   PT Evaluation Precautions/Restrictions   General PT Amount of Missed Time (min): 15 Minutes PT Missed Treatment Reason: Patient fatigue Vital Signs  Pain Pain Assessment Pain Scale: Faces Faces Pain Scale: Hurts a little bit Pain Type: Acute pain Pain Location: Knee Pain Orientation: Right Pain Intervention(s):  Repositioned Multiple Pain Sites: No Home Living/Prior Functioning Home Living Available Help at Discharge: Family Type of Home: House Home Access: Stairs to enter Technical brewer of Steps: 2 Home Layout: One level Bathroom Shower/Tub: Walk-in shower;Door [employer] Toilet: Standard Additional Comments: may need shower chair  Lives With: Spouse Prior Function Level of Independence: Independent with basic ADLs;Independent with homemaking with ambulation  Able to Take Stairs?: Yes Driving: No Vocation: Full time employment (works at [employer]) Comments: 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 Vision/Perception  Perception Perception: Impaired Inattention/Neglect: Does not attend to left visual field;Does not attend to left side of body Praxis Praxis: Impaired Praxis Impairment Details: Motor planning;Initiation;Ideation;Ideomotor  Cognition Overall Cognitive Status: Difficult to assess Arousal/Alertness: Lethargic Orientation Level: Oriented to person;Disoriented to situation;Disoriented to time;Oriented to place Attention: Sustained Sustained Attention: Impaired Sustained Attention Impairment: Verbal basic;Functional basic Memory: Impaired Memory Impairment: Other (comment) (alertness/attention not adequate for memory assessment) Awareness: Impaired Awareness Impairment: Intellectual impairment Problem Solving: Impaired Problem Solving Impairment: Verbal basic;Functional basic Executive Function: Initiating Initiating: Impaired Initiating Impairment: Verbal basic;Functional basic Safety/Judgment: Impaired Sensation Sensation Light Touch: Impaired by gross assessment Coordination Gross Motor Movements are Fluid and Coordinated: No Fine Motor Movements are Fluid and Coordinated: No Coordination and Movement Description: flaccid LUE and LLE; edema RUE impacting [place] Motor  Motor Motor: Hemiplegia Motor - Skilled Clinical  Observations: flaccid L hemi   Trunk/Postural Assessment  Cervical Assessment Cervical Assessment: Exceptions to [facility] Thoracic Assessment Thoracic Assessment: Exceptions to [facility] Lumbar Assessment Lumbar Assessment: Exceptions to [facility] Postural Control Postural Control: Deficits on evaluation Head Control: limited in all directions L>R Trunk Control: limited in all directions L>R Righting Reactions: absent Protective Responses: absent Postural Limitations: severe lack of control  Balance Balance Balance Assessed: Yes Dynamic Sitting Balance Sitting balance - Comments: Left/R/ posterior/anterior lean,, maxA-total A for sitting balance Extremity Assessment  RUE Assessment RUE Assessment: Exceptions to [facility] General Strength Comments: shoudler/elbow WFL; edematous digits/forearm; weak grasp LUE Assessment LUE Assessment: Exceptions to [facility] General Strength Comments: flaccid UE LUE Body System: Neuro Brunstrum levels for arm and hand: Arm;Hand Brunstrum level for arm: Stage I Presynergy Brunstrum level for hand: Stage I Flaccidity RLE Assessment RLE Assessment: Exceptions to [facility] General Strength Comments: grossly 2-/5 LLE Assessment LLE Assessment: Exceptions to [facility] General Strength Comments: 0/5 grossly  Care Tool Care Tool Bed Mobility Roll left and right activity   Roll left and right assist level: Dependent - Patient 0%    Sit to  lying activity   Sit to lying assist level: Dependent - Patient 0%    Lying to sitting edge of bed activity   Lying to sitting edge of bed assist level: Dependent - Patient 0%     Care Tool Transfers Sit to stand transfer   Sit to stand assist level: Dependent - Patient 0%    Chair/bed transfer   Chair/bed transfer assist level: Dependent - Patient 0%     Toilet transfer Toilet transfer activity did not occur: Safety/medical concerns      Scientist, product/process development transfer activity did not occur: Safety/medical concerns        Care Tool  Locomotion Ambulation Ambulation activity did not occur: Safety/medical concerns        Walk 10 feet activity Walk 10 feet activity did not occur: Safety/medical concerns       Walk 50 feet with 2 turns activity Walk 50 feet with 2 turns activity did not occur: Safety/medical concerns      Walk 150 feet activity Walk 150 feet activity did not occur: Safety/medical concerns      Walk 10 feet on uneven surfaces activity Walk 10 feet on uneven surfaces activity did not occur: Safety/medical concerns      Stairs Stair activity did not occur: Safety/medical concerns        Walk up/down 1 step activity Walk up/down 1 step or curb (drop down) activity did not occur: Safety/medical concerns     Walk up/down 4 steps activity did not occuR: Safety/medical concerns  Walk up/down 4 steps activity      Walk up/down 12 steps activity Walk up/down 12 steps activity did not occur: Safety/medical concerns      Pick up small objects from floor Pick up small object from the floor (from standing position) activity did not occur: Safety/medical concerns      Wheelchair Will patient use wheelchair at discharge?: Yes Type of Wheelchair: Manual Wheelchair activity did not occur: Safety/medical concerns      Wheel 50 feet with 2 turns activity Wheelchair 50 feet with 2 turns activity did not occur: Safety/medical concerns    Wheel 150 feet activity Wheelchair 150 feet activity did not occur: Safety/medical concerns      Refer to Care Plan for Long Term Goals  SHORT TERM GOAL WEEK 1 PT Short Term Goal 1 (Week 1): Pt will tolerate sitting in WC 2hour between PT PT Short Term Goal 2 (Week 1): Pt will sit EOB with mod assist x 5 mintues PT Short Term Goal 3 (Week 1): Pt will transfer with max assist of 1 to and from bed PT Short Term Goal 4 (Week 1): Pt will perform sit<>stand with max assist +2 for safety and LRAD  Recommendations for other services: None   Skilled Therapeutic  Intervention  Pt received supine in bed and agreeable to PT. Supine>sit transfer with total assist and total cues for awareness and body position in space. PT instructed patient in PT Evaluation and initiated treatment intervention; see above for results. PT educated patient in [place], rehab potential, rehab goals, and discharge recommendations along with recommendation for follow-up rehabilitation services.  Sitting balance EOB x 5 min with total A to prevent LOB in all direcitons. Squat pivot transfer to [facility] with total A-depentent level  Due to lack of activation in BLE or trunk control. PT adjusted WC leg rest and head rest to improve safety sitting in WC and prevent pressure injuries. Pt returned to room and performed SB  transfer to bed with total A +2. Sit>supine completed with total A +2 and left supine in bed with call bell in reach and all needs met.    Mobility Bed Mobility Bed Mobility: Rolling Right;Rolling Left Rolling Right: Total Assistance - Patient < 25%;Dependent - Patient equal 0% Rolling Left: Total Assistance - Patient < 25% Transfers Transfers: Sit to Stand;Stand to Sit;Squat Pivot Transfers;Lateral/Scoot Transfers Sit to Stand: Dependent - Patient 0% Stand to Sit: Dependent - Patient equal 0% Squat Pivot Transfers: Total Assistance - Patient < 25% Lateral/Scoot Transfers: Total Assistance - Patient < 25% Transfer (Assistive device): Other (Comment) (SB) Locomotion  Gait Ambulation: No Gait Gait: No Stairs / Additional Locomotion Stairs: No Wheelchair Mobility Wheelchair Mobility: No   Discharge Criteria: Patient will be discharged from PT if patient refuses treatment 3 consecutive times without medical reason, if treatment goals not met, if there is a change in medical status, if patient makes no progress towards goals or if patient is discharged from hospital.  The above assessment, treatment plan, treatment alternatives and goals were discussed and mutually agreed  upon: by patient and by family  Lorie Phenix 04/12/2020, 6:14 PM

## 2020-04-12 NOTE — Progress Notes (Signed)
[facility] PHYSICAL MEDICINE & REHABILITATION PROGRESS NOTE  Subjective/Complaints: Patient seen laying in bed this morning, working with therapies.  No reported issues overnight.  Wife and interpreter at bedside.  Patient is slowed.  Discussed bowel movements with therapies.  ROS: + Right knee pain.  Appears to deny CP, shortness of breath, nausea, vomiting, diarrhea.  Objective: Vital Signs: Blood pressure 133/76, pulse 80, temperature 99.4 F (37.4 C), temperature source Oral, resp. rate 18, height 4\' 11"  (1.499 m), weight 48.9 kg, SpO2 97 %. No results found. Recent Labs    04/11/20 0418 04/12/20 0520  WBC 11.3* 11.6*  HGB 11.8* 12.0*  HCT 36.1* 36.8*  PLT 271 284   Recent Labs    04/11/20 0418 04/12/20 0520  NA 136 136  K 3.7 3.8  CL 109 106  CO2 20* 19*  GLUCOSE 115* 107*  BUN 25* 24*  CREATININE 1.17 1.21  CALCIUM 8.1* 8.3*    Intake/Output Summary (Last 24 hours) at 04/12/2020 1235 Last data filed at 04/11/2020 1842 Gross per 24 hour  Intake 240 ml  Output 200 ml  Net 40 ml       Physical Exam: BP 133/76 (BP Location: Right Arm)   Pulse 80   Temp 99.4 F (37.4 C) (Oral)   Resp 18   Ht 4\' 11"  (1.499 m)   Wt 48.9 kg   SpO2 97%   BMI 21.77 kg/m  Constitutional: No distress . Vital signs reviewed. HENT: Normocephalic.  Atraumatic. Eyes: EOMI. No discharge. Cardiovascular: No JVD.  RRR. Respiratory: Normal effort.  No stridor.  Bilateral clear to auscultation. GI: Non-distended.  BS +. Skin: Warm and dry.  Dressing CDI. Psych: Slowed.  Delayed.  Flat. Musc: No edema in extremities.  No tenderness in extremities. Neuro: Alert Left facial weakness RUE/RLE: 3/5 proximal distal LUE/LE: 0/5 proximal distal  Assessment/Plan: 1. Functional deficits which require 3+ hours per day of interdisciplinary therapy in a comprehensive inpatient rehab setting.  Physiatrist is providing close team supervision and 24 hour management of active medical problems  listed below.  Physiatrist and rehab team continue to assess barriers to discharge/monitor patient progress toward functional and medical goals   Care Tool:  Bathing              Bathing assist       Upper Body Dressing/Undressing Upper body dressing        Upper body assist      Lower Body Dressing/Undressing Lower body dressing            Lower body assist       Toileting Toileting    Toileting assist Assist for toileting: Total Assistance - Patient < 25%     Transfers Chair/bed transfer  Transfers assist           Locomotion Ambulation   Ambulation assist              Walk 10 feet activity   Assist           Walk 50 feet activity   Assist           Walk 150 feet activity   Assist           Walk 10 feet on uneven surface  activity   Assist           Wheelchair     Assist               Wheelchair 50 feet with 2 turns  activity    Assist            Wheelchair 150 feet activity     Assist           Medical Problem List and Plan: 1.Left hemiparesissecondary to right basal ganglia intraparenchymal hemorrhage secondary to hypertensive crisis  Begin CIR evaluations 2. Antithrombotics: -DVT/anticoagulation:Subcutaneous heparin initiated for 02/25/2020 -antiplatelet therapy: N/A 3. Pain Management:Tylenol as needed 4. Mood:Provide emotional support -antipsychotic agents: N/A 5. Neuropsych: This patientis notcapable of making decisions on hisown behalf. 6. Skin/Wound Care:Routine skin checks 7. Fluids/Electrolytes/Nutrition:Routine in and outs. 8. Hypertension. Norvasc 10 mg daily, clonidine 0.1 mg twice daily, Lopressor 50 mg twice daily.   Monitor with increased mobility 9. History of open Uretlithotomy2005 in [place] as well as left PCNL and laser ureterotomyOctober 2018 followed by Dr. Marlou Porch. 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  -CONTINUE FOLEY TUBE.DO NOT REMOVE .Foley catheter to remain in place until follow-up in the office of Dr. Modena Slater for cystogram. 10. Diet controlled diabetes mellitus. Hemoglobin A1c 5.8. SSI -Educate pt/family as needed 11.  Leukocytosis  WBCs 11.6 on 4/8   Afebrile  Continue to monitor 12.  Acute blood loss anemia  Hemoglobin 12.0 on 4/8  Continue to monitor 13.  CKD   Creatinine 1.21 on 4/8  Continue to monitor 14.  Gout - in right knee  Patient took herbal supplement at home  Colchicine started on 4/8 15.  Slow transit constipation  Bowel meds increased on 4/8  LOS: 1 days A FACE TO FACE EVALUATION WAS PERFORMED  Bless Lisenby Karis Juba 04/12/2020, 12:35 PM

## 2020-04-12 NOTE — Plan of Care (Signed)
  Problem: RH Balance Goal: LTG Patient will maintain dynamic sitting balance (PT) Description: LTG:  Patient will maintain dynamic sitting balance with assistance during mobility activities (PT) Flowsheets (Taken 04/12/2020 1804) LTG: Pt will maintain dynamic sitting balance during mobility activities with:: Minimal Assistance - Patient > 75% Goal: LTG Patient will maintain dynamic standing balance (PT) Description: LTG:  Patient will maintain dynamic standing balance with assistance during mobility activities (PT) Flowsheets (Taken 04/12/2020 1804) LTG: Pt will maintain dynamic standing balance during mobility activities with:: Moderate Assistance - Patient 50 - 74%   Problem: Sit to Stand Goal: LTG:  Patient will perform sit to stand with assistance level (PT) Description: LTG:  Patient will perform sit to stand with assistance level (PT) Flowsheets (Taken 04/12/2020 1804) LTG: PT will perform sit to stand in preparation for functional mobility with assistance level: Moderate Assistance - Patient 50 - 74%   Problem: RH Bed Mobility Goal: LTG Patient will perform bed mobility with assist (PT) Description: LTG: Patient will perform bed mobility with assistance, with/without cues (PT). Flowsheets (Taken 04/12/2020 1804) LTG: Pt will perform bed mobility with assistance level of: Moderate Assistance - Patient 50 - 74%   Problem: RH Bed to Chair Transfers Goal: LTG Patient will perform bed/chair transfers w/assist (PT) Description: LTG: Patient will perform bed to chair transfers with assistance (PT). Flowsheets (Taken 04/12/2020 1804) LTG: Pt will perform Bed to Chair Transfers with assistance level: Moderate Assistance - Patient 50 - 74%   Problem: RH Car Transfers Goal: LTG Patient will perform car transfers with assist (PT) Description: LTG: Patient will perform car transfers with assistance (PT). Flowsheets (Taken 04/12/2020 1804) LTG: Pt will perform car transfers with assist:: (TBD) --    Problem: RH Ambulation Goal: LTG Patient will ambulate in controlled environment (PT) Description: LTG: Patient will ambulate in a controlled environment, # of feet with assistance (PT). Flowsheets (Taken 04/12/2020 1804) LTG: Pt will ambulate in controlled environ  assist needed:: Maximal Assistance - Patient 25 - 49% LTG: Ambulation distance in controlled environment: 5ft with PT only to force use of BLE and improve postural control   Problem: RH Wheelchair Mobility Goal: LTG Patient will propel w/c in controlled environment (PT) Description: LTG: Patient will propel wheelchair in controlled environment, # of feet with assist (PT) Flowsheets (Taken 04/12/2020 1804) LTG: Pt will propel w/c in controlled environ  assist needed:: Minimal Assistance - Patient > 75% LTG: Propel w/c distance in controlled environment: 87ft   Problem: RH Stairs Goal: LTG Patient will ambulate up and down stairs w/assist (PT) Description: LTG: Patient will ambulate up and down # of stairs with assistance (PT) Flowsheets (Taken 04/12/2020 1804) LTG: Pt will ambulate up/down stairs assist needed:: (TBD) --

## 2020-04-12 NOTE — Evaluation (Signed)
Occupational Therapy Assessment and Plan  Patient Details  Name: Anthony Huber MRN: [id number] Date of Birth: 04/10/1960  OT Diagnosis: abnormal posture, apraxia, cognitive deficits, flaccid hemiplegia and hemiparesis, hemiplegia affecting non-dominant side, muscle weakness (generalized), pain in joint and swelling of limb Rehab Potential:   ELOS: 4-4.5 weeks   Today's Date: 04/12/2020 OT Individual Time: 0900-1000 OT Individual Time Calculation (min): 60 min     Hospital Problem: Principal Problem:   ICH (intracerebral hemorrhage) ([place])   Past Medical History:  Past Medical History:  Diagnosis Date  . Coronary artery disease   . Diabetes mellitus without complication ([place])   . Hypertension    Past Surgical History:  Past Surgical History:  Procedure Laterality Date  . BACK SURGERY    . BLADDER SURGERY    . CORONARY ANGIOPLASTY WITH STENT PLACEMENT    . CYSTOSCOPY WITH STENT PLACEMENT Left 09/05/2016   Procedure: CYSTOSCOPY, URETEROSCOPY;  Surgeon: Ardis Hughs, MD;  Location: [facility];  Service: Urology;  Laterality: Left;  . IR NEPHROSTOMY PLACEMENT LEFT  09/06/2016  . NEPHROLITHOTOMY Left 09/16/2016   Procedure: NEPHROLITHOTOMY PERCUTANEOUS  LEFT;  Surgeon: Ardis Hughs, MD;  Location: [facility];  Service: Urology;  Laterality: Left;    Assessment & Plan Clinical Impression:   60 y.o. M admitted 4/3 with nausea, HA, and L arm weakness. 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 Significant PMH: HTN, diabetes, CAD.  Patient currently requires total with basic self-care skills secondary to muscle weakness, decreased cardiorespiratoy endurance, impaired timing and sequencing, abnormal tone, unbalanced muscle activation, motor apraxia, decreased coordination and decreased motor planning, decreased visual acuity, decreased attention to left and left side neglect, decreased initiation, decreased attention, decreased awareness, decreased problem  solving, decreased safety awareness, decreased memory and delayed processing and decreased sitting balance, decreased standing balance, decreased postural control, hemiplegia and decreased balance strategies.  Prior to hospitalization, patient could complete BADL.IADL with independent .  Patient will benefit from skilled intervention to decrease level of assist with basic self-care skills and increase independence with basic self-care skills prior to discharge home with care partner.  Anticipate patient will require moderate physical assestance and follow up home health.      OT Evaluation Precautions/Restrictions    General Chart Reviewed: Yes Family/Caregiver Present: Yes Vital Signs   Pain   Home Living/Prior Functioning Home Living Family/patient expects to be discharged to:: Private residence Living Arrangements: Spouse/significant other,Children Available Help at Discharge: Family Type of Home: House Home Access: Stairs to enter Technical brewer of Steps: 2 Home Layout: One level Bathroom Shower/Tub: Walk-in Biomedical scientist: Standard Additional Comments: may need shower chair  Lives With: Spouse Prior Function Level of Independence: Independent with basic ADLs,Independent with homemaking with ambulation  Able to Take Stairs?: Yes Driving: No Comments: 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 Vision Baseline Vision/History: Wears glasses Wears Glasses: Reading only Patient Visual Report: No change from baseline Vision Assessment?: Vision impaired- to be further tested in functional context Perception  Perception: Impaired Inattention/Neglect: Does not attend to left visual field;Does not attend to left side of body Praxis Praxis: Impaired Praxis Impairment Details: Motor planning;Initiation Cognition Overall Cognitive Status: Difficult to assess Arousal/Alertness: Lethargic Orientation Level: Person Year:  2022 Month: April Day of Week: Incorrect Memory: Impaired Immediate Memory Recall: Sock;Blue;Bed Memory Recall Sock: Not able to recall Memory Recall Blue: Not able to recall Memory Recall Bed: Not able to recall Sustained Attention: Impaired  Awareness: Impaired Problem Solving: Impaired Sensation Sensation Light Touch: Appears Intact Coordination Gross Motor Movements are Fluid and Coordinated: No Fine Motor Movements are Fluid and Coordinated: No Coordination and Movement Description: flaccid LUE; edema RUE impacting [place] Motor  Motor Motor: Hemiplegia Motor - Skilled Clinical Observations: flaccid L hemi  Trunk/Postural Assessment     Balance   Extremity/Trunk Assessment RUE Assessment RUE Assessment: Exceptions to [facility] General Strength Comments: shoudler/elbow WFL; edematous digits/forearm; weak grasp LUE Assessment LUE Assessment: Exceptions to [facility] General Strength Comments: flaccid UE LUE Body System: Neuro Brunstrum levels for arm and hand: Arm;Hand Brunstrum level for arm: Stage I Presynergy Brunstrum level for hand: Stage I Flaccidity  Care Tool Care Tool Self Care Eating        Oral Care    Oral Care Assist Level: Total assistance - Patient < 25%    Bathing     Body parts bathed by helper: Right arm;Left arm;Chest;Abdomen;Front perineal area;Buttocks;Right upper leg;Left upper leg;Right lower leg;Left lower leg;Face   Assist Level: 2 Helpers    Upper Body Dressing(including orthotics)         MAX    Lower Body Dressing (excluding footwear)     2 helpers      Putting on/Taking off footwear     dependent         Care Tool Toileting Toileting activity     safety/medical     Care Tool Bed Mobility Roll left and right activity     max-2 helpers    Sit to lying activity        Lying to sitting edge of bed activity         Care Tool Transfers Sit to stand transfer   Sit to stand assist level: Total Assistance - Patient < 25%     Chair/bed transfer         Materials engineer transfer activity did not occur: Safety/medical concerns       Care Tool Cognition Expression of Ideas and Wants Expression of Ideas and Wants: Frequent difficulty - frequently exhibits difficulty with expressing needs and ideas   Understanding Verbal and Non-Verbal Content Understanding Verbal and Non-Verbal Content: Sometimes understands - understands only basic conversations or simple, direct phrases. Frequently requires cues to understand   Memory/Recall Ability *first 3 days only Memory/Recall Ability *first 3 days only: Current season;That he or she is in a hospital/hospital unit    Refer to Care Plan for Long Term Goals  SHORT TERM GOAL WEEK 1 OT Short Term Goal 1 (Week 1): pt will sit EOB wiht MAX A of 1 for 3 min in prep for ADL OT Short Term Goal 2 (Week 1): Pt will manage LUE with multimodal cuing in prep for UB dressing OT Short Term Goal 3 (Week 1): Pt will intiate during transfer training 75% of trials to demo improved command following OT Short Term Goal 4 (Week 1): Pt will groom with MOD A  Recommendations for other services: None    Skilled Therapeutic Intervention 1:1. Pt received in bed with wife and interpreter present. Pt completes bathing/dressing/grooming at bed level with total- +2 A overall. Pt with significant L inattention and severly impaired trunk/head control impacting midline orientation/static sitting balance. Pt requires +2 A to assume semi stand and sitting EOB. Pt with poor initiation, delayed processing and poor grasp in RUE impacting participation/performance of BADLs. Exited session with pt seated in BED, exit alarm on and call light in reach    ADL  total A bed level with +2 A to roll R for clothing management. Mobility  Bed Mobility Bed Mobility: Rolling Right;Rolling Left Rolling Right: 2 Helpers Rolling Left: Maximal Assistance - Patient 25-49% Transfers Sit to Stand: Dependent -  Patient 0% (unable to achieve fully erect posture) Stand to Sit: Dependent - Patient equal 0%   Discharge Criteria: Patient will be discharged from OT if patient refuses treatment 3 consecutive times without medical reason, if treatment goals not met, if there is a change in medical status, if patient makes no progress towards goals or if patient is discharged from hospital.  The above assessment, treatment plan, treatment alternatives and goals were discussed and mutually agreed upon: by patient and by family  Tonny Branch 04/12/2020, 10:05 AM

## 2020-04-12 NOTE — Evaluation (Signed)
Speech Language Pathology Assessment and Plan  Patient Details  Name: Anthony Huber MRN: [id number] Date of Birth: 1960-02-27  SLP Diagnosis: Cognitive Impairments;Speech and Language deficits  Rehab Potential: Good ELOS: 3-4 weeks    Today's Date: 04/12/2020 SLP Individual Time: 0800-0900 SLP Individual Time Calculation (min): 60 min   Hospital Problem: Principal Problem:   ICH (intracerebral hemorrhage) ([facility]) Active Problems:   Slow transit constipation   Chronic gout of right knee   Acute blood loss anemia   Leukocytosis  Past Medical History:  Past Medical History:  Diagnosis Date  . Coronary artery disease   . Diabetes mellitus without complication ([place])   . Hypertension    Past Surgical History:  Past Surgical History:  Procedure Laterality Date  . BACK SURGERY    . BLADDER SURGERY    . CORONARY ANGIOPLASTY WITH STENT PLACEMENT    . CYSTOSCOPY WITH STENT PLACEMENT Left 09/05/2016   Procedure: CYSTOSCOPY, URETEROSCOPY;  Surgeon: Ardis Hughs, MD;  [facility];  Service: Urology;  Laterality: Left;  . IR NEPHROSTOMY PLACEMENT LEFT  09/06/2016  . NEPHROLITHOTOMY Left 09/16/2016   Procedure: NEPHROLITHOTOMY PERCUTANEOUS  LEFT;  Surgeon: Ardis Hughs, MD;  Location: [facility];  Service: Urology;  Laterality: Left;    Assessment / Plan / Recommendation [name] a 60 year old right-handed male with history of open ureterlithotomy2005 in [place] as well as left PCNL and laser ureterotomyOctober 2018 per Dr. Louis Meckel, CAD, diabetes mellitus and hypertension with remote tobacco use. Per chart review lives with spouse independent prior to admission. 1 level home. He works at a [employer], materials. Presented 04/06/2020 with altered mental status headache and left-sided weakness. Blood pressure 170/100. Cranial CT scan showed a 4.3 x 2.4 x 3.4 cm acute intraparenchymal hemorrhage centered at the right basal ganglia. Localized mass-effect with up to 5 mm right to left  shift. Admission chemistries unremarkable except hemoglobin A1c 5.8 WBC 15,700. MRI/MRA showed stable size and morphology of right basal ganglia intraparenchymal hemorrhage with similar surrounding vasogenic edema and regional mass-effect. Associated 5 mm right to left shift. No intraventricular extension hydrocephalus or ventricular trapping. MRA was unremarkable. Echocardiogram with ejection fraction of 65 to 70% no wall motion abnormalities. Maintained on Cleviprex for blood pressure control. Patient was cleared to begin subcutaneous heparin for DVT prophylaxis 04/06/2020. Hospital course follow-up urology services 04/08/2020 for evaluation of leaking urine from his incision line which had been intermittent since ureterotomy. CT abdomen pelvis showed several tiny stones in the distal left ureter. Moderate left hydroureter and severe left hydronephrosis. Severe left renal parenchymal atrophy and cortical thinning secondary to chronic hydronephrosis. Linear band to the proximal left ureter with focal luminal narrowing likely representing adhesion or scarring with 2 punctate nonobstructing left renal inferior pole calculi and fluid cultures were sent and plan Foley catheter tube insertion. 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 Therapy evaluations completed due to patient's weakness and decreased functional mobility was admitted for a comprehensive rehab program.   Clinical Impression Patient presents with a moderate cognitive impairment based on assessment which was limited by patient's lethargy and overall poor alertness and attention. His wife was present as was Holiday representative. Wife provided information on baseline stating that patient worked at a [employer] and was independent with all ADL's. During assessment, patient was able to state that he was in the hospital,  the month  is April but stated year as "2002" and in terms of awareness to reason for hospitalization, stated "I got ill". Patient stated age as "5's" but not able to say exact age. He incorrectly reported that he has three children but wife reported they had 10 children (2 have passed away). Patient became upset during questioning and put arm over eyes. His wife then encouraged him to participate. He did not initiate eye contact or any communication unless spoken to and kept head turned to left, only turning to look at SLP on right side when cued. He followed basic one-step directions but required repetition and some instances of demonstration to initiate. He consumed straw sips of thin liquids (water) without overt s/s aspiraiton or penetration but was not alert enough for any solids. Of note, lethargy was reported in previous SLP notes while patient was in acute care. Patient currently requires mod-maximal cues for even basic level ADL's and is not demonstrating adequate alertness or attention to function without significant level of assistance and supervision. He will require skilled ST services while in CIR to maximize his cognitive functioning.  Skilled Therapeutic Interventions          Bedside swallow eval, cognitive-linguistic speech evaluation  SLP Assessment  Patient will need skilled Speech Lanaguage Pathology Services during CIR admission    Recommendations  Liquid Administration via: Cup;Straw Medication Administration: Whole meds with liquid Supervision: Trained caregiver to feed patient Compensations: Minimize environmental distractions;Slow rate;Small sips/bites Postural Changes and/or Swallow Maneuvers: Seated upright 90 degrees Oral Care Recommendations: Oral care BID;Staff/trained caregiver to provide oral care Patient destination: Home Follow up Recommendations: 24 hour supervision/assistance;Home Health SLP Equipment Recommended: None recommended by SLP    SLP Frequency 3 to 5  out of 7 days   SLP Duration  SLP Intensity  SLP Treatment/Interventions 3-4 weeks  Minumum of 1-2 x/day, 30 to 90 minutes  Environmental controls;Functional tasks;Speech/Language facilitation;Patient/family education;Internal/external aids;Cueing hierarchy;Cognitive remediation/compensation    Pain Pain Assessment Pain Scale: Faces Faces Pain Scale: Hurts a little bit Pain Type: Acute pain Pain Location: Knee Pain Orientation: Right Pain Intervention(s): Repositioned Multiple Pain Sites: No  Prior Functioning Cognitive/Linguistic Baseline: Within functional limits Type of Home: House  Lives With: Spouse Available Help at Discharge: Family Vocation: Full time employment (works at [employer])  SLP Evaluation Cognition Overall Cognitive Status: Difficult to assess Arousal/Alertness: Lethargic Orientation Level: Oriented to person;Disoriented to situation;Disoriented to time;Oriented to place Attention: Sustained Sustained Attention: Impaired Sustained Attention Impairment: Verbal basic;Functional basic Memory: Impaired Memory Impairment: Other (comment) (alertness/attention not adequate for memory assessment) Awareness: Impaired Awareness Impairment: Intellectual impairment Problem Solving: Impaired Problem Solving Impairment: Verbal basic;Functional basic Executive Function: Initiating Initiating: Impaired Initiating Impairment: Verbal basic;Functional basic Safety/Judgment: Impaired  Comprehension Auditory Comprehension Overall Auditory Comprehension: Other (comment) (unable to adequately assess secondary to patient's lethargy) Yes/No Questions: Not tested Commands: Impaired One Step Basic Commands: 25-49% accurate Interfering Components: Attention;Processing speed EffectiveTechniques: Extra processing time;Repetition Visual Recognition/Discrimination Discrimination: Not tested Reading Comprehension Reading Status: Not  tested Expression Expression Primary Mode of Expression: Verbal Verbal Expression Overall Verbal Expression: Appears within functional limits for tasks assessed Oral Motor Oral Motor/Sensory Function Overall Oral Motor/Sensory Function: Other (comment) (needs further assessment but no significant assymetry of facial musculature observed)  Care Tool Care Tool Cognition Expression of Ideas and Wants Expression of Ideas and Wants: Frequent difficulty - frequently exhibits difficulty with expressing needs and ideas   Understanding Verbal and Non-Verbal Content Understanding Verbal and Non-Verbal Content: Sometimes understands - understands only  basic conversations or simple, direct phrases. Frequently requires cues to understand   Memory/Recall Ability *first 3 days only Memory/Recall Ability *first 3 days only: Current season;That he or she is in a hospital/hospital unit     Bedside Swallowing Assessment General Date of Onset: 04/06/20 Previous Swallow Assessment: None found Diet Prior to this Study: Regular;Thin liquids Temperature Spikes Noted: No Respiratory Status: Room air History of Recent Intubation: No Behavior/Cognition: Lethargic/Drowsy;Requires cueing Oral Cavity - Dentition: Adequate natural dentition Self-Feeding Abilities: Needs assist;Needs set up;Total assist Patient Positioning: Upright in bed Baseline Vocal Quality: Low vocal intensity Volitional Cough: Cognitively unable to elicit Volitional Swallow: Unable to elicit  Oral Care Assessment   Ice Chips   Thin Liquid Thin Liquid: Within functional limits Presentation: Straw Nectar Thick   Honey Thick   Puree Puree: Not tested Solid Solid: Not tested BSE Assessment Risk for Aspiration Impact on safety and function: No limitations;Mild aspiration risk Other Related Risk Factors: Cognitive impairment  Short Term Goals: Week 1: SLP Short Term Goal 1 (Week 1): Patient will maintain attention to basic  level function tasks for increments of 1-2 minutes with modA cues SLP Short Term Goal 2 (Week 1): Patient will perform basic level functional tasks with maxA cues for initiation. SLP Short Term Goal 3 (Week 1): Patient will respond to open ended questions at least 75% of the time with modA cues. SLP Short Term Goal 4 (Week 1): Patient will tolerate regular solids, thin liquids diet without overt s/s aspiration or penetration.  Refer to Care Plan for Long Term Goals  Recommendations for other services: None   Discharge Criteria: Patient will be discharged from SLP if patient refuses treatment 3 consecutive times without medical reason, if treatment goals not met, if there is a change in medical status, if patient makes no progress towards goals or if patient is discharged from hospital.  The above assessment, treatment plan, treatment alternatives and goals were discussed and mutually agreed upon: by family  Sonia Baller, MA, CCC-SLP Speech Therapy

## 2020-04-12 NOTE — Progress Notes (Signed)
Inpatient Rehabilitation Center Individual Statement of Services  Patient Name:  Anthony Huber  Date:  04/12/2020  Welcome to the Inpatient Rehabilitation Center.  Our goal is to provide you with an individualized program based on your diagnosis and situation, designed to meet your specific needs.  With this comprehensive rehabilitation program, you will be expected to participate in at least 3 hours of rehabilitation therapies Monday-Friday, with modified therapy programming on the weekends.  Your rehabilitation program will include the following services:  Physical Therapy (PT), Occupational Therapy (OT), Speech Therapy (ST), 24 hour per day rehabilitation nursing, Therapeutic Recreaction (TR), Neuropsychology, Care Coordinator, Rehabilitation Medicine, Nutrition Services and Pharmacy Services  Weekly team conferences will be held on Wednesday to discuss your progress.  Your Inpatient Rehabilitation Care Coordinator will talk with you frequently to get your input and to update you on team discussions.  Team conferences with you and your family in attendance may also be held.  Expected length of stay: 30-34 days  Overall anticipated outcome: min-mod-max levels  Depending on your progress and recovery, your program may change. Your Inpatient Rehabilitation Care Coordinator will coordinate services and will keep you informed of any changes. Your Inpatient Rehabilitation Care Coordinator's name and contact numbers are listed  below.  The following services may also be recommended but are not provided by the Inpatient Rehabilitation Center:   Driving Evaluations  Home Health Rehabiltiation Services  Outpatient Rehabilitation Services  Vocational Rehabilitation   Arrangements will be made to provide these services after discharge if needed.  Arrangements include referral to agencies that provide these services.  Your insurance has been verified to be:  BCBS Your primary doctor is:   None  Pertinent information will be shared with your doctor and your insurance company.  Inpatient Rehabilitation Care Coordinator:  Dossie Der, [place] [phone] or Luna [place]  Information discussed with and copy given to patient by: Lucy Chris, 04/12/2020, 1:27 PM

## 2020-04-13 DIAGNOSIS — I610 Nontraumatic intracerebral hemorrhage in hemisphere, subcortical: Secondary | ICD-10-CM | POA: Diagnosis not present

## 2020-04-13 LAB — GLUCOSE, CAPILLARY
Glucose-Capillary: 112 mg/dL — ABNORMAL HIGH (ref 70–99)
Glucose-Capillary: 117 mg/dL — ABNORMAL HIGH (ref 70–99)
Glucose-Capillary: 160 mg/dL — ABNORMAL HIGH (ref 70–99)
Glucose-Capillary: 100 mg/dL — ABNORMAL HIGH (ref 70–99)

## 2020-04-13 NOTE — Progress Notes (Signed)
[place] PHYSICAL MEDICINE & REHABILITATION PROGRESS NOTE  Subjective/Complaints: Resting qiuetly in bed ,   YBF:[id number]  Objective: Vital Signs: Blood pressure 116/71, pulse 73, temperature 99.4 F (37.4 C), temperature source Oral, resp. rate 18, height 4\' 11"  (1.499 m), weight 48.9 kg, SpO2 97 %. No results found. Recent Labs    04/11/20 0418 04/12/20 0520  WBC 11.3* 11.6*  HGB 11.8* 12.0*  HCT 36.1* 36.8*  PLT 271 284   Recent Labs    04/11/20 0418 04/12/20 0520  NA 136 136  K 3.7 3.8  CL 109 106  CO2 20* 19*  GLUCOSE 115* 107*  BUN 25* 24*  CREATININE 1.17 1.21  CALCIUM 8.1* 8.3*    Intake/Output Summary (Last 24 hours) at 04/13/2020 1026 Last data filed at 04/13/2020 06/13/2020 Gross per 24 hour  Intake 720 ml  Output 2200 ml  Net -1480 ml       Physical Exam: BP 116/71 (BP Location: Right Arm)   Pulse 73   Temp 99.4 F (37.4 C) (Oral)   Resp 18   Ht 4\' 11"  (1.499 m)   Wt 48.9 kg   SpO2 97%   BMI 21.77 kg/m  Constitutional: No distress . Vital signs reviewed. GEN NAD  HENT: Normocephalic.  Atraumatic. Eyes: EOMI. No discharge. Cardiovascular: No JVD.  RRR. Respiratory: Normal effort.  No stridor.  Bilateral clear to auscultation. GI: Non-distended.  BS +. Skin: Warm and dry.  Dressing CDI. Psych: lethargy but opens eyes to voice  Musc: No edema in extremities.  No tenderness in extremities. Neuro: Alert Left facial weakness RUE/RLE: 3/5 proximal distal LUE/LE: 0/5 proximal distal  Assessment/Plan: 1. Functional deficits which require 3+ hours per day of interdisciplinary therapy in a comprehensive inpatient rehab setting.  Physiatrist is providing close team supervision and 24 hour management of active medical problems listed below.  Physiatrist and rehab team continue to assess barriers to discharge/monitor patient progress toward functional and medical goals   Care Tool:  Bathing        Body parts bathed by helper: Right arm,Left  arm,Chest,Abdomen,Front perineal area,Buttocks,Right upper leg,Left upper leg,Right lower leg,Left lower leg,Face     Bathing assist Assist Level: 2 Helpers     Upper Body Dressing/Undressing Upper body dressing   What is the patient wearing?: Pull over shirt    Upper body assist Assist Level: Total Assistance - Patient < 25%    Lower Body Dressing/Undressing Lower body dressing      What is the patient wearing?: Pants,Incontinence brief     Lower body assist Assist for lower body dressing: 2 Helpers     Toileting Toileting    Toileting assist Assist for toileting: Total Assistance - Patient < 25%     Transfers Chair/bed transfer  Transfers assist     Chair/bed transfer assist level: Dependent - Patient 0%     Locomotion Ambulation   Ambulation assist   Ambulation activity did not occur: Safety/medical concerns          Walk 10 feet activity   Assist  Walk 10 feet activity did not occur: Safety/medical concerns        Walk 50 feet activity   Assist Walk 50 feet with 2 turns activity did not occur: Safety/medical concerns         Walk 150 feet activity   Assist Walk 150 feet activity did not occur: Safety/medical concerns         Walk 10 feet on uneven surface  activity   Assist Walk 10 feet on uneven surfaces activity did not occur: Safety/medical concerns         Wheelchair     Assist Will patient use wheelchair at discharge?: Yes Type of Wheelchair: Manual Wheelchair activity did not occur: Safety/medical concerns         Wheelchair 50 feet with 2 turns activity    Assist    Wheelchair 50 feet with 2 turns activity did not occur: Safety/medical concerns       Wheelchair 150 feet activity     Assist  Wheelchair 150 feet activity did not occur: Safety/medical concerns        Medical Problem List and Plan: 1.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 Antithrombotics: -DVT/anticoagulation:Subcutaneous heparin initiated for 02/25/2020 -antiplatelet therapy: N/A 3. Pain Management:Tylenol as needed 4. Mood:Provide emotional support -antipsychotic agents: N/A 5. Neuropsych: This patientis notcapable of making decisions on hisown behalf. 6. Skin/Wound Care:Routine skin checks 7. Fluids/Electrolytes/Nutrition:Routine in and outs. 8. Hypertension. Norvasc 10 mg daily, clonidine 0.1 mg twice daily, Lopressor 50 mg twice daily.   Monitor with increased mobility 9. History of open Uretlithotomy2005 in [place] as well as left PCNL and laser ureterotomyOctober 2018 followed by Dr. Marlou Porch. 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  -CONTINUE FOLEY TUBE.DO NOT REMOVE .Foley catheter to remain in place until follow-up in the office of Dr. Modena Slater for cystogram. 10. Diet controlled diabetes mellitus. Hemoglobin A1c 5.8. SSI -Educate pt/family as needed CBG (last 3)  Recent Labs    04/12/20 1645 04/12/20 2128 04/13/20 0558  GLUCAP 131* 160* 112*  controlled 4/9  11.  Leukocytosis  WBCs 11.6 on 4/8   Afebrile  Continue to monitor 12.  Acute blood loss anemia  Hemoglobin 12.0 on 4/8  Continue to monitor 13.  CKD   Creatinine 1.21 on 4/8  Continue to monitor 14.  Gout - in right knee  Patient took herbal supplement at home  Colchicine started on 4/8 15.  Slow transit constipation  Bowel meds increased on 4/8  LOS: 2 days A FACE TO FACE EVALUATION WAS PERFORMED  Erick Colace 04/13/2020, 10:26 AM

## 2020-04-14 DIAGNOSIS — I610 Nontraumatic intracerebral hemorrhage in hemisphere, subcortical: Secondary | ICD-10-CM | POA: Diagnosis not present

## 2020-04-14 LAB — GLUCOSE, CAPILLARY
Glucose-Capillary: 109 mg/dL — ABNORMAL HIGH (ref 70–99)
Glucose-Capillary: 98 mg/dL (ref 70–99)
Glucose-Capillary: 118 mg/dL — ABNORMAL HIGH (ref 70–99)
Glucose-Capillary: 125 mg/dL — ABNORMAL HIGH (ref 70–99)

## 2020-04-14 MED ORDER — TRAMADOL HCL 50 MG PO TABS
50.0000 mg | ORAL_TABLET | Freq: Four times a day (QID) | ORAL | Status: DC | PRN
  Administered 2020-04-14 – 2020-04-16 (×2): 50 mg via ORAL
  Filled 2020-04-14 (×2): qty 1

## 2020-04-14 MED ORDER — DICLOFENAC SODIUM 1 % EX GEL
2.0000 g | Freq: Four times a day (QID) | CUTANEOUS | Status: DC
  Administered 2020-04-14 – 2020-05-17 (×120): 2 g via TOPICAL
  Filled 2020-04-14 (×4): qty 100

## 2020-04-14 NOTE — Progress Notes (Signed)
Occupational Therapy Session Note  Patient Details  Name: Anthony Huber MRN: [id number] Date of Birth: 09-Apr-1960  Today's Date: 04/14/2020 OT Individual Time: 8756-4332 OT Individual Time Calculation (min): 56 min   Short Term Goals: Week 1:  OT Short Term Goal 1 (Week 1): pt will sit EOB wiht MAX A of 1 for 3 min in prep for ADL OT Short Term Goal 2 (Week 1): Pt will manage LUE with multimodal cuing in prep for UB dressing OT Short Term Goal 3 (Week 1): Pt will intiate during transfer training 75% of trials to demo improved command following OT Short Term Goal 4 (Week 1): Pt will groom with MOD A  Skilled Therapeutic Interventions/Progress Updates:    Pt greeted via PT handoff, sitting in the TIS. No s/s pain and interpretor + wife present. Pt participated in [facility] activities for the Lt UE focusing on visual attendance to affected limb, recognizing affected limb, and joint protection while assisting Lt UE with movement. Pt requiring max cues and hand over hand assistance, tended to move his Lt arm with increased force without manual guidance. He exhibited no or little sensation in the Lt UE, no pain response noted when noxious stimuli applied to skin. +2 for squat pivot<bed, pt with pushing tendencies and overall very poor trunk control. +2 for sit<supine and for boosting up. OT educated pts spouse on how to assist him with PROM forearm supination, wrist extension, digit (+thumb) extension. She exhibited carryover of understanding given practice and cues. Education provided on importance of ROM at this time especially to prevent swelling. Education provided on hemiplegic limb positioning as well. Provided him with bilateral elbow protectors, pt reported some Rt elbow pain. Also pain in the Rt knee related to gout, noted inability to fully extend Rt knee. He remained in care of RN at end of session for pain medicine.     Therapy Documentation Precautions:  Restrictions Weight Bearing  Restrictions: No Vital Signs: Therapy Vitals Temp: 97.9 F (36.6 C) Pulse Rate: 72 Resp: 16 BP: 116/72 Patient Position (if appropriate): Sitting Oxygen Therapy SpO2: 97 % O2 Device: Room Air Pain: Pain Assessment Pain Scale: Faces Pain Score: Asleep Faces Pain Scale: Hurts a little bit Pain Type: Acute pain Pain Location: Hand Pain Orientation: Right Pain Descriptors / Indicators: Aching Pain Frequency: Intermittent Pain Onset: Gradual Pain Intervention(s): Medication (See eMAR);Repositioned ADL:    Therapy/Group: Individual Therapy  Anthony Huber A Anthony Huber 04/14/2020, 4:05 PM

## 2020-04-14 NOTE — Progress Notes (Signed)
[place] PHYSICAL MEDICINE & REHABILITATION PROGRESS NOTE  Subjective/Complaints: Interpreter at bedside , some abd pain with yawning, ate well this am Discussed effect of stroke    ROS No CP or SOB, occ cough ( non observed)  Objective: Vital Signs: Blood pressure 121/77, pulse 67, temperature 97.8 F (36.6 C), resp. rate 20, height 4\' 11"  (1.499 m), weight 48.9 kg, SpO2 97 %. No results found. Recent Labs    04/12/20 0520  WBC 11.6*  HGB 12.0*  HCT 36.8*  PLT 284   Recent Labs    04/12/20 0520  NA 136  K 3.8  CL 106  CO2 19*  GLUCOSE 107*  BUN 24*  CREATININE 1.21  CALCIUM 8.3*    Intake/Output Summary (Last 24 hours) at 04/14/2020 0803 Last data filed at 04/14/2020 0500 Gross per 24 hour  Intake 240 ml  Output 1000 ml  Net -760 ml       Physical Exam: BP 121/77 (BP Location: Right Arm)   Pulse 67   Temp 97.8 F (36.6 C)   Resp 20   Ht 4\' 11"  (1.499 m)   Wt 48.9 kg   SpO2 97%   BMI 21.77 kg/m   General: No acute distress Mood and affect are appropriate Heart: Regular rate and rhythm no rubs murmurs or extra sounds Lungs: Clear to auscultation, breathing unlabored, no rales or wheezes Abdomen: Positive bowel sounds, soft nontender to palpation, nondistended Extremities: No clubbing, cyanosis, or edema Skin: No evidence of breakdown, no evidence of rash  Skin: Warm and dry.   Psych: alert , delayed responses  Musc: No edema in extremities.  No tenderness in extremities. Neuro: Alert Left facial weakness RUE/RLE: 4-/5 proximal distal LUE/LE: 0/5 proximal distal Sensation - pt says LT is equal on both hands but cannot identify which finger is touched on the Left hand Assessment/Plan: 1. Functional deficits which require 3+ hours per day of interdisciplinary therapy in a comprehensive inpatient rehab setting.  Physiatrist is providing close team supervision and 24 hour management of active medical problems listed below.  Physiatrist and rehab  team continue to assess barriers to discharge/monitor patient progress toward functional and medical goals   Care Tool:  Bathing        Body parts bathed by helper: Right arm,Left arm,Chest,Abdomen,Front perineal area,Buttocks,Right upper leg,Left upper leg,Right lower leg,Left lower leg,Face     Bathing assist Assist Level: 2 Helpers     Upper Body Dressing/Undressing Upper body dressing   What is the patient wearing?: Pull over shirt    Upper body assist Assist Level: Maximal Assistance - Patient 25 - 49%    Lower Body Dressing/Undressing Lower body dressing      What is the patient wearing?: Hospital gown only,Pants     Lower body assist Assist for lower body dressing: Total Assistance - Patient < 25%     Toileting Toileting    Toileting assist Assist for toileting: Total Assistance - Patient < 25%     Transfers Chair/bed transfer  Transfers assist     Chair/bed transfer assist level: Dependent - Patient 0%     Locomotion Ambulation   Ambulation assist   Ambulation activity did not occur: Safety/medical concerns          Walk 10 feet activity   Assist  Walk 10 feet activity did not occur: Safety/medical concerns        Walk 50 feet activity   Assist Walk 50 feet with 2 turns activity did not  occur: Safety/medical concerns         Walk 150 feet activity   Assist Walk 150 feet activity did not occur: Safety/medical concerns         Walk 10 feet on uneven surface  activity   Assist Walk 10 feet on uneven surfaces activity did not occur: Safety/medical concerns         Wheelchair     Assist Will patient use wheelchair at discharge?: Yes Type of Wheelchair: Manual Wheelchair activity did not occur: Safety/medical concerns         Wheelchair 50 feet with 2 turns activity    Assist    Wheelchair 50 feet with 2 turns activity did not occur: Safety/medical concerns       Wheelchair 150 feet activity      Assist  Wheelchair 150 feet activity did not occur: Safety/medical concerns        Medical Problem List and Plan: 1.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 Antithrombotics: -DVT/anticoagulation:Subcutaneous heparin initiated for 02/25/2020 -antiplatelet therapy: N/A 3. Pain Management:Tylenol as needed 4. Mood:Provide emotional support -antipsychotic agents: N/A 5. Neuropsych: This patientis notcapable of making decisions on hisown behalf. 6. Skin/Wound Care:Routine skin checks 7. Fluids/Electrolytes/Nutrition:Routine in and outs. 8. Hypertension. Norvasc 10 mg daily, clonidine 0.1 mg twice daily, Lopressor 50 mg twice daily.   Monitor with increased mobility 9. History of open Uretlithotomy2005 in [place] as well as left PCNL and laser ureterotomyOctober 2018 followed by Dr. Marlou Porch. 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  -CONTINUE FOLEY TUBE.DO NOT REMOVE .Foley catheter to remain in place until follow-up in the office of Dr. Modena Slater for cystogram. 10. Diet controlled diabetes mellitus. Hemoglobin A1c 5.8. SSI -Educate pt/family as needed CBG (last 3)  Recent Labs    04/13/20 1632 04/13/20 2120 04/14/20 0609  GLUCAP 160* 100* 109*  controlled 4/10  11.  Leukocytosis  WBCs 11.6 on 4/8   Afebrile  Continue to monitor 12.  Acute blood loss anemia  Hemoglobin 12.0 on 4/8  Continue to monitor 13.  CKD   Creatinine 1.21 on 4/8  Continue to monitor 14.  Gout - in right knee  Patient took herbal supplement at home, add voltaren gel, small effusion- may need arthrocentesis an dcorticosteroid injection if this does not improve  Colchicine started on 4/8 15.  Slow transit constipation  Bowel meds increased on 4/8  LOS: 3  days A FACE TO FACE EVALUATION WAS PERFORMED  Erick Colace 04/14/2020, 8:03 AM

## 2020-04-14 NOTE — Progress Notes (Signed)
Physical Therapy Session Note  Patient Details  Name: Anthony Huber MRN: [id number] Date of Birth: [birth year]  Today's Date: 04/14/2020 PT Individual Time: 9741-6384 and 1230-1300 PT Individual Time Calculation (min): 27 min and 30 min   Short Term Goals: Week 1:  PT Short Term Goal 1 (Week 1): Pt will tolerate sitting in WC 2hour between PT PT Short Term Goal 2 (Week 1): Pt will sit EOB with mod assist x 5 mintues PT Short Term Goal 3 (Week 1): Pt will transfer with max assist of 1 to and from bed PT Short Term Goal 4 (Week 1): Pt will perform sit<>stand with max assist +2 for safety and LRAD  Skilled Therapeutic Interventions/Progress Updates:     Session 1: Patient in bed asleep with his wife in the room upon PT arrival. Patient easily aroused to tactile cues and agreeable to PT session. No interpreter present for session, patient's language not accessible via [employer]ation system. Patient's wife called family to interpret throughout session. Patient reported 9/10 R knee pain during session, RN made aware. PT provided repositioning, rest breaks, and distraction as pain interventions throughout session. Noted increased suprapatellar edema, per RN MD already aware and interventions in place.   Patient remained very lethargic during session. Patient able to perform AAROM SLR on L, AAROM   Neuromuscular Re-ed: Patient performed the following lower and upper extremity motor control activities: -L AAROM SLR 2x5 -R AAROM knee flexion/extension with very limited ROM and lacking >10 deg knee extension due to painful muscle guarding -L AAROM knee flexion/extension 2x5 with poor consistency with muscle activation -B hand grip with no activation on L x6 trials -L elbow flexion/extension without muscle activation x8 trials  Required increased time for delay in telephone interpretation and patient being [facility] for cues. Patient's family member unable to continue interpreting for patient, but  reported that another family member that could interpret was on the way. Ended session with plan to return when family available to provide interpretation.   Patient in bed with his wife at bedside at end of session with breaks locked, bed alarm set, and all needs within reach.    Session 2: Patient in bed with his wife and brother in the room upon PT arrival. Patient alert and agreeable to PT session. Patient's brother provided interpretation throughout session. Will discuss interpretation schedule with scheduler tomorrow.   Therapeutic Activity: Bed Mobility: Patient performed rolling R/L with max A and total a for L upper extremity management. Provided cues for log roll technique, noted patient with redued head turn to the L. Performed supine to sit with mod-max A +2, as his wife facilitated from behind when patient began to sit up. Provided verbal cues for use of R elbow to push up to sitting. Patient with strong push to the L in sitting requiring max a +2 for sitting balance initially. Transfers: Patient performed a lateral scoot transfer bed>TIS w/c with max-total A +2. Provided cues and facilitation for hand placement and head-hips relationship for proper technique and decreased assist with transfers.   Neuromuscular Re-ed: Patient performed the following sitting balance activities EOB x8.5 min: -midline orientation with visual target for R shoulder and facilitation to correct L trunk lean, maintained mid line with min A x20-30 sec before returning to L lean, frequently had to place R hand in his lap or hold his hand to avoid pushing, progressed to reaching within BOS with poor ability to follow therapist's cues due to language vs cognitive vs hearing  barriers -attempted looking L on several trials with various internal and external cues without success  In TIS, performed cervical rotation R/L progressing from [facility] through full range R/L.  Patient in TIS w/c at end of session  with breaks locked, seat belt secured, and all needs within reach. Adjusted and adapted TIS w/c to better support patient. Applied towels to foot rests, as they are unable to be placed at a shorter length to support patient's feet in sitting and placed wedge on L side due to increased width of TIS w/c to reduce L lean in sitting.    Therapy Documentation Precautions:  Restrictions Weight Bearing Restrictions: No    Therapy/Group: Individual Therapy  Kijuan Gallicchio L Leonardo Plaia PT, DPT  04/14/2020, 3:51 PM

## 2020-04-14 NOTE — Progress Notes (Signed)
Speech Language Pathology Daily Session Note  Patient Details  Name: Eliel Dudding MRN: [id number] Date of Birth: [birth year]  Today's Date: 04/14/2020 SLP Individual Time: 0905-1000 SLP Individual Time Calculation (min): 55 min  Short Term Goals: Week 1: SLP Short Term Goal 1 (Week 1): Patient will maintain attention to basic level function tasks for increments of 1-2 minutes with modA cues SLP Short Term Goal 2 (Week 1): Patient will perform basic level functional tasks with maxA cues for initiation. SLP Short Term Goal 3 (Week 1): Patient will respond to open ended questions at least 75% of the time with modA cues. SLP Short Term Goal 4 (Week 1): Patient will tolerate regular solids, thin liquids diet without overt s/s aspiration or penetration.  Skilled Therapeutic Interventions:  Pt was seen for skilled ST targeting cognitive goals.  Pt was sleeping upon arrival but awakened easily to voice and light touch.  He did remain lethargic throughout today's therapy session despite being easy to awaken.  SLP repositioned pt in bed to maximize alertness for participation in treatment.  Pt initially kept his head flexed to the left but with cues could scan between left and right visual fields during functional and structured tasks following max assist verbal cues.  Max assist multimodal cues were needed to improve awareness of left upper extremity to facilitate pt assisting in propping his arm and hand on pillows.  Pt brushed his teeth and washed his face following set up of necessary items with mod-max assist multimodal cues for task initiation and sequencing.  SLP then facilitated the session with a peg board task to address attention to task.  Pt sustained his attention to task for ~30 to 90 second intervals with mod-max assist multimodal cues for redirection.  Pt demonstrated task impersistence and needed cues to complete task items in their entirety.  Total assist/hand over hand was needed to replicate  patterns from pictures but pt was able to disassemble patterns and return pegs to the box with mod assist verbal cues.    Pain Pain Assessment Pain Scale: Faces Faces Pain Scale: Hurts a little bit Pain Type: Acute pain Pain Location: Hand Pain Orientation: Right Pain Descriptors / Indicators: Aching Pain Intervention(s): Repositioned  Therapy/Group: Individual Therapy  Keir Viernes, Melanee Spry 04/14/2020, 10:04 AM

## 2020-04-15 DIAGNOSIS — I619 Nontraumatic intracerebral hemorrhage, unspecified: Secondary | ICD-10-CM | POA: Diagnosis not present

## 2020-04-15 LAB — GLUCOSE, CAPILLARY
Glucose-Capillary: 101 mg/dL — ABNORMAL HIGH (ref 70–99)
Glucose-Capillary: 92 mg/dL (ref 70–99)
Glucose-Capillary: 107 mg/dL — ABNORMAL HIGH (ref 70–99)
Glucose-Capillary: 132 mg/dL — ABNORMAL HIGH (ref 70–99)

## 2020-04-15 MED ORDER — METFORMIN HCL 500 MG PO TABS
250.0000 mg | ORAL_TABLET | Freq: Every day | ORAL | Status: DC
  Administered 2020-04-16: 250 mg via ORAL
  Filled 2020-04-15: qty 1

## 2020-04-15 NOTE — Progress Notes (Signed)
Patient ID: Jeevan Kalla, male   DOB: December 03, 1960, 60 y.o.   MRN: [id number] Met with the patient and wife to review secondary stroke risk management and foley care after discharge. Interpreter not in the room, patient's wife called their son to review information as well. Handouts in vietnamese (which the patient can read) were also left with the patient for reference. Discussed management of DM, HTN, HLD including medications, dietary modifications and foley care with note to follow up with the urologist using the interpreter now in the room and son noted he would discuss with the wife at a later time as well. Wife able to note understanding of the information shared. Noted she had watched staff and felt comfortable providing the care and completed a return demonstration of foley care/emptying the drainage bag. Aware that she can ask questions if she has concerns or is not clear on care for discharge. Continue to follow along to discharge to address educational needs and facilitate preparation for discharge. Margarito Liner

## 2020-04-15 NOTE — Progress Notes (Signed)
Speech Language Pathology Daily Session Note  Patient Details  Name: Anthony Huber MRN: [id number] Date of Birth: [birth year]  Today's Date: 04/15/2020 SLP Individual Time: 0867-6195 SLP Individual Time Calculation (min): 58 min  Short Term Goals: Week 1: SLP Short Term Goal 1 (Week 1): Patient will maintain attention to basic level function tasks for increments of 1-2 minutes with modA cues SLP Short Term Goal 2 (Week 1): Patient will perform basic level functional tasks with maxA cues for initiation. SLP Short Term Goal 3 (Week 1): Patient will respond to open ended questions at least 75% of the time with modA cues. SLP Short Term Goal 4 (Week 1): Patient will tolerate regular solids, thin liquids diet without overt s/s aspiration or penetration.  Skilled Therapeutic Interventions:Skilled ST services focused on cognitive skills. SLP facilitated sustained attention, initiation and basic problem solving skill in basic card task (WAR) and in sorting blocks by color. Pt initially required max A verbal cues for initiation and focused/sustained attention, however once pt demonstrated basic auditory comprehension it greatly increase. Pt demonstrated increase sustained attention/ continued initation of same task up to 4 minutes with supervision A verbal cues and mod I basic problem solving skills when sorting by 2-3 colors and naming highest card. Pt's primary issue during the session was due to limited vision in Left eye and reports of headache pain (RN provided medication.) Pt was left in room with call bell within reach and bed alarm set. SLP recommends to continue skilled services.     Pain Pain Assessment Pain Scale: 0-10 Pain Score: 0-No pain Faces Pain Scale: Hurts worst Pain Type: Acute pain Pain Location: Head Pain Orientation: Right Pain Descriptors / Indicators: Headache Pain Frequency: Intermittent Pain Onset: On-going Patients Stated Pain Goal: 1 Pain Intervention(s): RN made  aware Multiple Pain Sites: No  Therapy/Group: Individual Therapy  Xzander Gilham  [facility] 04/15/2020, 4:19 PM

## 2020-04-15 NOTE — Progress Notes (Signed)
Physical Therapy Session Note  Patient Details  Name: Anthony Huber MRN: [id number] Date of Birth: 10/04/1960  Today's Date: 04/15/2020 PT Individual Time: 8341-9622 PT Individual Time Calculation (min): 54 min   Short Term Goals: Week 1:  PT Short Term Goal 1 (Week 1): Pt will tolerate sitting in WC 2hour between PT PT Short Term Goal 2 (Week 1): Pt will sit EOB with mod assist x 5 mintues PT Short Term Goal 3 (Week 1): Pt will transfer with max assist of 1 to and from bed PT Short Term Goal 4 (Week 1): Pt will perform sit<>stand with max assist +2 for safety and LRAD  Skilled Therapeutic Interventions/Progress Updates:     Pt greeted supine in bed at start of session, wife at bedside, pt agreeable to therapy session. Wife called their son who speaks English to assist with interpretation as at start of session, no interpreter present (intrepete arrived ~15 minutes within session). Pt reports R knee pain with suprapatellar edema noted. Painful palpation and limited PROM, lacking full extension. Supine<>sit with totalA with HOB flat via log rolling technique. Upon sitting EOB, requires max/totalA for sitting balance due to L ?pushing and significant core weakness. Placed 6inch platform under feet for better support as pt is 4'11. Performed sliding board transfer with totalA with wife providing +2 assist for stabilizing TIS w/c. He then required totalA for repositioning in w/c. W/c transport for time management to main rehab gym. Performed additional sliding board transfer with similar technique as listed above, requiring totalA with wife assisting with w/c management. While seated edge of mat, provided large wedge for back support and mirror in front of him for visual feedback. NMR for static and dynamic sitting balance, working on core facilitation, righting reactions, and midline awareness. With back support, pt was able to briefly maintain sitting with CGA/minA. However without back support, requires  max/totalA. With cues for increasing erect posture, he throws himself into forward trunk flexion and has great difficulty achieving midline. Placed both hands in posterior prop positioning, where pt was then able to maintain sitting with minA. Introduced midline reaching with RUE for targets (horseshoes) to facilitate LUE weightbearing and balance - pt requires max/totalA for sitting balance while doing this. Also performed the same task for forward reaching with similar results. Pt reporting dizziness and fatigue - sliding board transfer with totalA back to his TIS w/c and returned to his room. Assessed BP in sitting, reading 119/72 (87) HR 66. Pt reporting improved symptoms. Pt remained seated (reclined) in TIS w/c with safety belt alarm on, needs within reach, LUE propped with pillow for support. Needs within reach and wife at bedside.   Therapy Documentation Precautions:  Restrictions Weight Bearing Restrictions: No General:    Therapy/Group: Individual Therapy  [name] [name] PT 04/15/2020, 7:34 AM

## 2020-04-15 NOTE — Progress Notes (Signed)
[place] PHYSICAL MEDICINE & REHABILITATION PROGRESS NOTE  Subjective/Complaints: No complaints this morning. Appreciate Deborah's education of patient.  Wife feels comfortable with foley care Vitals stable    ROS No CP or SOB, occ cough ( non observed) , denies pain Objective: Vital Signs: Blood pressure 107/65, pulse 64, temperature 98.2 F (36.8 C), resp. rate 18, height 4\' 11"  (1.499 m), weight 48.9 kg, SpO2 97 %. No results found. No results for input(s): WBC, HGB, HCT, PLT in the last 72 hours. No results for input(s): NA, K, CL, CO2, GLUCOSE, BUN, CREATININE, CALCIUM in the last 72 hours.  Intake/Output Summary (Last 24 hours) at 04/15/2020 1421 Last data filed at 04/15/2020 0731 Gross per 24 hour  Intake 240 ml  Output 1150 ml  Net -910 ml       Physical Exam: BP 107/65 (BP Location: Right Arm)   Pulse 64   Temp 98.2 F (36.8 C)   Resp 18   Ht 4\' 11"  (1.499 m)   Wt 48.9 kg   SpO2 97%   BMI 21.77 kg/m  Gen: no distress, normal appearing HEENT: oral mucosa pink and moist, NCAT Cardio: Reg rate Chest: normal effort, normal rate of breathing Abd: soft, non-distended Ext: no edema Skin: Warm and dry.   Psych: alert , delayed responses  Musc: No edema in extremities.  No tenderness in extremities. Neuro: Alert Left facial weakness RUE/RLE: 4-/5 proximal distal LUE/LE: 0/5 proximal distal Sensation - pt says LT is equal on both hands but cannot identify which finger is touched on the Left hand Assessment/Plan: 1. Functional deficits which require 3+ hours per day of interdisciplinary therapy in a comprehensive inpatient rehab setting.  Physiatrist is providing close team supervision and 24 hour management of active medical problems listed below.  Physiatrist and rehab team continue to assess barriers to discharge/monitor patient progress toward functional and medical goals   Care Tool:  Bathing    Body parts bathed by patient: Face   Body parts  bathed by helper: Right arm,Left arm,Chest,Abdomen,Front perineal area,Buttocks,Right upper leg,Left upper leg,Right lower leg,Left lower leg,Face     Bathing assist Assist Level: Maximal Assistance - Patient 24 - 49%     Upper Body Dressing/Undressing Upper body dressing   What is the patient wearing?: Pull over shirt    Upper body assist Assist Level: Maximal Assistance - Patient 25 - 49%    Lower Body Dressing/Undressing Lower body dressing      What is the patient wearing?: Hospital gown only,Pants     Lower body assist Assist for lower body dressing: Total Assistance - Patient < 25%     Toileting Toileting    Toileting assist Assist for toileting: Total Assistance - Patient < 25%     Transfers Chair/bed transfer  Transfers assist     Chair/bed transfer assist level: 2 Helpers     Locomotion Ambulation   Ambulation assist   Ambulation activity did not occur: Safety/medical concerns          Walk 10 feet activity   Assist  Walk 10 feet activity did not occur: Safety/medical concerns        Walk 50 feet activity   Assist Walk 50 feet with 2 turns activity did not occur: Safety/medical concerns         Walk 150 feet activity   Assist Walk 150 feet activity did not occur: Safety/medical concerns         Walk 10 feet on uneven surface  activity   Assist Walk 10 feet on uneven surfaces activity did not occur: Safety/medical concerns         Wheelchair     Assist Will patient use wheelchair at discharge?: Yes Type of Wheelchair: Manual Wheelchair activity did not occur: Safety/medical concerns         Wheelchair 50 feet with 2 turns activity    Assist    Wheelchair 50 feet with 2 turns activity did not occur: Safety/medical concerns       Wheelchair 150 feet activity     Assist  Wheelchair 150 feet activity did not occur: Safety/medical concerns        Medical Problem List and Plan: 1.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 Impaired mobility -DVT/anticoagulation:Subcutaneous continue heparin initiated for 02/25/2020 -antiplatelet therapy: N/A 3. Pain Management:continue Tylenol as needed 4. Mood:Provide emotional support -antipsychotic agents: N/A 5. Neuropsych: This patientis notcapable of making decisions on hisown behalf. 6. Skin/Wound Care:Routine skin checks 7. Fluids/Electrolytes/Nutrition:Routine in and outs. 8. Hypertension. BP well controlled- continue Norvasc 10 mg daily, clonidine 0.1 mg twice daily, Lopressor 50 mg twice daily.   Monitor with increased mobility 9. History of open Uretlithotomy2005 in [place] as well as left PCNL and laser ureterotomyOctober 2018 followed by Dr. Marlou Porch. 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  -CONTINUE FOLEY TUBE.DO NOT REMOVE .Foley catheter to remain in place until follow-up in the office of Dr. Modena Slater for cystogram. 10. Diet controlled diabetes mellitus. Hemoglobin A1c 5.8. SSI -Educate pt/family as needed CBG (last 3)  Recent Labs    04/14/20 2104 04/15/20 0603 04/15/20 1146  GLUCAP 125* 101* 92  CBGs 92-125: start metformin 250mg  11.  Leukocytosis  WBCs 11.6 on 4/8   Afebrile  Continue to monitor 12.  Acute blood loss anemia  Hemoglobin 12.0 on 4/8  Continue to monitor 13.  CKD   Creatinine 1.21 on 4/8  Continue to monitor 14.  Gout - in right knee  Patient took herbal supplement at home, add voltaren gel, small effusion- may need arthrocentesis an dcorticosteroid injection if this does not improve  Colchicine started on 4/8 15.  Slow transit constipation  Bowel meds increased on 4/8  LOS: 4 days A FACE TO FACE EVALUATION WAS PERFORMED  6/8 P  Narda Fundora 04/15/2020, 2:21 PM

## 2020-04-15 NOTE — Progress Notes (Signed)
Occupational Therapy Session Note  Patient Details  Name: Anthony Huber MRN: [id number] Date of Birth: 1960-05-26  Today's Date: 04/15/2020 OT Individual Time: 0900-1000 OT Individual Time Calculation (min): 60 min    Short Term Goals: Week 1:  OT Short Term Goal 1 (Week 1): pt will sit EOB wiht MAX A of 1 for 3 min in prep for ADL OT Short Term Goal 2 (Week 1): Pt will manage LUE with multimodal cuing in prep for UB dressing OT Short Term Goal 3 (Week 1): Pt will intiate during transfer training 75% of trials to demo improved command following OT Short Term Goal 4 (Week 1): Pt will groom with MOD A  Skilled Therapeutic Interventions/Progress Updates:    Pt received in [place] chair with wife and interpretor present.  Pt taken to day room to transfer to mat.  +2 TOTAL A to transfer to mat, initially tried with slide board but pt pushing forward with hips and started to slide off of board.  Instead just did a squat pivot.  Pt sat on edge of mat for 15 minutes with TOTAL  A due to poor trunk control, pushing, poor proprioception. Used visual feedback (mirror and wife sitting on his R side with therapist on his L) cued pt to sit tall next to his wife. For a few seconds at a time pt able to sit with max A.  Moved pt to supine. Pt worked on knee sways for core control with mod A, bridges with min A, PROM followed by A/arom for L hip flex and ext. Placed L foot on therapy ball and rolled foot out and in, pt was able to partially activate hip flexors and extensors.  Used place and hold of L arm over head and to stimulate triceps, used "hand drop" exercise. After 6 reps, pt was able to activate triceps to prevent hand from dropping and even was able to slightly push hand up. +2 with total A to transfer back to wc and back to bed as pt tends to push back and push away with R hand.  Had pt hold his left hand with right to avoid the pushing.   Pt in bed with all needs met. Alarm on. Pillow under L arm for  support.  Therapy Documentation Precautions:  Restrictions Weight Bearing Restrictions: No    Vital Signs: 133/74    Pain: Pain Assessment Pain Score: 0-No pain      Therapy/Group: Individual Therapy  [place] 04/15/2020, 10:44 AM

## 2020-04-16 DIAGNOSIS — I619 Nontraumatic intracerebral hemorrhage, unspecified: Secondary | ICD-10-CM | POA: Diagnosis not present

## 2020-04-16 LAB — GLUCOSE, CAPILLARY
Glucose-Capillary: 95 mg/dL (ref 70–99)
Glucose-Capillary: 113 mg/dL — ABNORMAL HIGH (ref 70–99)
Glucose-Capillary: 115 mg/dL — ABNORMAL HIGH (ref 70–99)
Glucose-Capillary: 103 mg/dL — ABNORMAL HIGH (ref 70–99)

## 2020-04-16 MED ORDER — TRAMADOL HCL 50 MG PO TABS: 50.0000 mg | ORAL_TABLET | Freq: Three times a day (TID) | ORAL | Status: DC | PRN

## 2020-04-16 MED ORDER — METFORMIN HCL 500 MG PO TABS
500.0000 mg | ORAL_TABLET | Freq: Every day | ORAL | Status: DC
  Administered 2020-04-17 – 2020-04-18 (×2): 500 mg via ORAL
  Filled 2020-04-16 (×2): qty 1

## 2020-04-16 NOTE — Progress Notes (Signed)
Occupational Therapy Session Note  Patient Details  Name: Anthony Huber MRN: [id number] Date of Birth: 1960-12-18  Today's Date: 04/16/2020 OT Individual Time: 0900-1000 OT Individual Time Calculation (min): 60 min    Short Term Goals: Week 1:  OT Short Term Goal 1 (Week 1): pt will sit EOB wiht MAX A of 1 for 3 min in prep for ADL OT Short Term Goal 2 (Week 1): Pt will manage LUE with multimodal cuing in prep for UB dressing OT Short Term Goal 3 (Week 1): Pt will intiate during transfer training 75% of trials to demo improved command following OT Short Term Goal 4 (Week 1): Pt will groom with MOD A  Skilled Therapeutic Interventions/Progress Updates:    Pt received in room in [place] chair with spouse present. Interpretor unable to be present, but wife understood visual directions well and then able to interpret for her husband. +2 A this session as pt as very impaired trunk control and needs +2 support for A and safety.  From wc, worked on doffing and donning a shirt with max A and pants with total A. Had to lift into semi stand briefly with +2 while spouse assisted donning pants over hips.    Pt taken to therapy gym and worked from [employer] level with TIS in upright position and side rails removed - worked on reaching activities to encourage trunk extension/elongation, reaching to L for left side awareness, LUE wt bearing, LUE PROM  With table top slides, midline awareness with lateral wt shifting.  Pt needs constant cues and physical A.  Pt taken back to room - completed SB transfer total +2 to bed. Sat EOB with back support from therapy tech for trunk facilitation for extension.  Pt adjusted in bed with all needs met.  Bed alarm set.   Therapy Documentation Precautions:  Restrictions Weight Bearing Restrictions: No   Pain: Pain Assessment Pain Scale: 0-10 Pain Score: 6  Faces Pain Scale: Hurts even more Pain Type: Acute pain Pain Location: Leg Pain Orientation: Right Pain Descriptors /  Indicators: Aching Pain Frequency: Intermittent Pain Onset: On-going Patients Stated Pain Goal: 1 Pain Intervention(s): Medication (See eMAR) ADL: ADL Grooming: Moderate assistance Upper Body Bathing: Maximal assistance Where Assessed-Upper Body Bathing: Bed level Lower Body Bathing: Dependent Where Assessed-Lower Body Bathing: Bed level Upper Body Dressing: Maximal assistance Where Assessed-Upper Body Dressing: Chair Lower Body Dressing: Dependent Where Assessed-Lower Body Dressing: Bed level Toileting: Dependent Where Assessed-Toileting: Bed level Toilet Transfer: Unable to assess   Therapy/Group: Individual Therapy  [place] 04/16/2020, 10:33 AM

## 2020-04-16 NOTE — Progress Notes (Addendum)
Speech Language Pathology Daily Session Note  Patient Details  Name: Anthony Huber MRN: [id number] Date of Birth: 08-05-1960  Today's Date: 04/16/2020 SLP Individual Time: 7282-0601 SLP Individual Time Calculation (min): 58 min  Short Term Goals: Week 1: SLP Short Term Goal 1 (Week 1): Patient will maintain attention to basic level function tasks for increments of 1-2 minutes with modA cues SLP Short Term Goal 2 (Week 1): Patient will perform basic level functional tasks with maxA cues for initiation. SLP Short Term Goal 3 (Week 1): Patient will respond to open ended questions at least 75% of the time with modA cues. SLP Short Term Goal 4 (Week 1): Patient will tolerate regular solids, thin liquids diet without overt s/s aspiration or penetration.  Skilled Therapeutic Interventions:Skilled ST services focused on speech skills. Interpretor present. Pt requested to lay at 30-45 degree angle in bed to reduced dizziness and blurred vision. Pt was able to demonstrate word finding skills at the phrase level in picture description task with 80% accuracy with mod A semantic/sentance completion cues and mod I in 30% of opportunities. Pt demonstrated 80% intelligibility at phrase level with max A verbal cues to increase vocal intensity and over articulate. Pt demonstrated sustained attention for 45 minutes. Pt was left in room with call bell within reach and bed alarm set. SLP recommends to continue skilled services.     Pain Pain Assessment Pain Score: 0-No pain  Therapy/Group: Individual Therapy  Marchel Foote  [facility] 04/16/2020, 4:03 PM

## 2020-04-16 NOTE — Progress Notes (Signed)
Orthopedic Tech Progress Note Patient Details:  Anthony Huber 25-Apr-1960 [id number] Called in order to [employer] for an AFO Patient ID: Anthony Huber, male   DOB: [birth year], 60 y.o.   MRN: [id number]   Donald Pore 04/16/2020, 8:50 AM

## 2020-04-16 NOTE — Plan of Care (Signed)
  Problem: Consults Goal: RH STROKE PATIENT EDUCATION Description: See Patient Education module for education specifics  Outcome: Progressing Goal: Diabetes Guidelines if Diabetic/Glucose > 140 Description: If diabetic or lab glucose is > 140 mg/dl - Initiate Diabetes/Hyperglycemia Guidelines & Document Interventions  Outcome: Progressing   Problem: RH BOWEL ELIMINATION Goal: RH STG MANAGE BOWEL WITH ASSISTANCE Description: STG Manage Bowel with mod I Assistance. Outcome: Progressing   Problem: RH KNOWLEDGE DEFICIT Goal: RH STG INCREASE KNOWLEDGE OF DIABETES Description: Pt and family will demonstrated understanding of medication regimen, dietary and lifestyle modifications to better control blood glucose levels independently upon discharge.  Outcome: Progressing Goal: RH STG INCREASE KNOWLEDGE OF HYPERTENSION Description: Pt and family will demonstrated understanding of medication regimen, dietary and lifestyle modifications to better control blood pressure independently upon discharge.  Outcome: Progressing Goal: RH STG INCREASE KNOWLEGDE OF HYPERLIPIDEMIA Description: Pt and family will demonstrated understanding of medication regimen, dietary and lifestyle modifications to better control blood cholesterol levels independently upon discharge.  Outcome: Progressing Goal: RH STG INCREASE KNOWLEDGE OF STROKE PROPHYLAXIS Description: Pt and family will demonstrated understanding of medication regimen, dietary and lifestyle modifications to better control blood pressure levels and prevent stroke independently upon discharge.  Outcome: Progressing   Problem: RH BLADDER ELIMINATION Goal: RH STG MANAGE BLADDER WITH EQUIPMENT WITH ASSISTANCE Description: STG Manage Bladder With Equipment With mod Assistance Outcome: Progressing

## 2020-04-16 NOTE — Progress Notes (Signed)
Physical Therapy Session Note  Patient Details  Name: Anthony Huber MRN: [id number] Date of Birth: 07-Jul-1960  Today's Date: 04/16/2020 PT Individual Time: 0800-0855 PT Individual Time Calculation (min): 55 min   Short Term Goals: Week 1:  PT Short Term Goal 1 (Week 1): Pt will tolerate sitting in WC 2hour between PT PT Short Term Goal 2 (Week 1): Pt will sit EOB with mod assist x 5 mintues PT Short Term Goal 3 (Week 1): Pt will transfer with max assist of 1 to and from bed PT Short Term Goal 4 (Week 1): Pt will perform sit<>stand with max assist +2 for safety and LRAD  Skilled Therapeutic Interventions/Progress Updates:     Pt greeted supine in bed at start of session, wife at bedside, no interpeter present throughout session and neither pt nor wife speak english. Family unavailable to call to assist and stratus interpreter also unavailable for [place] language. Wife assisted with interpretation via gesturing. Supine<>sit with maxA with HOB flat via log rolling technique. Requires totalA for static sitting EOB. Placed 5inch platform under feet to improve foot support and pt is 4'11. Performed squat<>pivot transfer with totalA from EOB to TIS w/c and then needing totalA for repositioning in w/c. Transported to main rehab gym for time management. Performed additional squat<>pivot transfer with totalA in similar fashion as outlined above - using over the back technique. Provided large wedge posteriorly to provide back support while focusing remainder of session on NMR for sitting balance - also provided large mirror for visual feedback to improve midline orientation and postural awareness. With back support, pt able to sit with minA but demonstrates LARGE loss's of balance to the L and forward requiring totalA for recovery with absent righting reactions. With back support, performed targeted reaching with RUE in multiple directions including forward, ipsilateral, midline, and overhead reaching - this  included cone stacking as well. Without back support, pt required totalA for sitting balance with heavy posterior lean and again demonstrating absent righting reactions. Cues provided throughout for corrections but language barrier may be inhibiting... Placed LUE in external rotation on mat table to encourage weight bearing and tricep facilitation. Performed squat<>pivot transfer back to his TIS w/c with totalA as outlined above. Returned to his room where he remained reclined in TIS w/c with safety belt alarm on, needs within reach, LUE pillow supporting for shldr approximation.   Therapy Documentation Precautions:  Restrictions Weight Bearing Restrictions: No General:    Therapy/Group: Individual Therapy  Folasade Mooty P Loana Salvaggio PT 04/16/2020, 7:36 AM

## 2020-04-16 NOTE — Progress Notes (Signed)
[name] PHYSICAL MEDICINE & REHABILITATION PROGRESS NOTE  Subjective/Complaints: Complaining of leg pain this morning and receiving tylenol PRAFO ordered Vital signs stable   ROS No CP or SOB, occ cough ( non observed) , denies pain Objective: Vital Signs: Blood pressure 111/70, pulse 63, temperature 98.3 F (36.8 C), resp. rate 16, height 4\' 11"  (1.499 m), weight 48.9 kg, SpO2 97 %. No results found. No results for input(s): WBC, HGB, HCT, PLT in the last 72 hours. No results for input(s): NA, K, CL, CO2, GLUCOSE, BUN, CREATININE, CALCIUM in the last 72 hours.  Intake/Output Summary (Last 24 hours) at 04/16/2020 1502 Last data filed at 04/16/2020 0815 Gross per 24 hour  Intake 240 ml  Output 450 ml  Net -210 ml       Physical Exam: BP 111/70 (BP Location: Right Arm)   Pulse 63   Temp 98.3 F (36.8 C)   Resp 16   Ht 4\' 11"  (1.499 m)   Wt 48.9 kg   SpO2 97%   BMI 21.77 kg/m  Gen: no distress, normal appearing HEENT: oral mucosa pink and moist, NCAT Cardio: Reg rate Chest: normal effort, normal rate of breathing Abd: soft, non-distended Ext: no edema   Psych: alert , delayed responses  Musc: No edema in extremities.  No tenderness in extremities. Neuro: Alert Left facial weakness RUE/RLE: 4-/5 proximal distal LUE/LE: 0/5 proximal distal Sensation - pt says LT is equal on both hands but cannot identify which finger is touched on the Left hand Assessment/Plan: 1. Functional deficits which require 3+ hours per day of interdisciplinary therapy in a comprehensive inpatient rehab setting.  Physiatrist is providing close team supervision and 24 hour management of active medical problems listed below.  Physiatrist and rehab team continue to assess barriers to discharge/monitor patient progress toward functional and medical goals   Care Tool:  Bathing    Body parts bathed by patient: Face   Body parts bathed by helper: Right arm,Left arm,Chest,Abdomen,Front  perineal area,Buttocks,Right upper leg,Left upper leg,Right lower leg,Left lower leg,Face     Bathing assist Assist Level: Maximal Assistance - Patient 24 - 49%     Upper Body Dressing/Undressing Upper body dressing   What is the patient wearing?: Pull over shirt    Upper body assist Assist Level: Maximal Assistance - Patient 25 - 49%    Lower Body Dressing/Undressing Lower body dressing      What is the patient wearing?: Pants,Incontinence brief     Lower body assist Assist for lower body dressing: 2 Helpers     Toileting Toileting    Toileting assist Assist for toileting: Total Assistance - Patient < 25%     Transfers Chair/bed transfer  Transfers assist     Chair/bed transfer assist level: 2 Helpers     Locomotion Ambulation   Ambulation assist   Ambulation activity did not occur: Safety/medical concerns          Walk 10 feet activity   Assist  Walk 10 feet activity did not occur: Safety/medical concerns        Walk 50 feet activity   Assist Walk 50 feet with 2 turns activity did not occur: Safety/medical concerns         Walk 150 feet activity   Assist Walk 150 feet activity did not occur: Safety/medical concerns         Walk 10 feet on uneven surface  activity   Assist Walk 10 feet on uneven surfaces activity did not occur:  Safety/medical concerns         Wheelchair     Assist Will patient use wheelchair at discharge?: Yes Type of Wheelchair: Manual Wheelchair activity did not occur: Safety/medical concerns         Wheelchair 50 feet with 2 turns activity    Assist    Wheelchair 50 feet with 2 turns activity did not occur: Safety/medical concerns       Wheelchair 150 feet activity     Assist  Wheelchair 150 feet activity did not occur: Safety/medical concerns        Medical Problem List and Plan: 1.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 Impaired mobility -DVT/anticoagulation:Subcutaneous continue heparin initiated for 02/25/2020 -antiplatelet therapy: N/A 3. Pain Management:continue Tylenol as needed. Decrease tramadol to q8H prn.  4. Mood:Provide emotional support -antipsychotic agents: N/A 5. Neuropsych: This patientis notcapable of making decisions on hisown behalf. 6. Skin/Wound Care:Routine skin checks 7. Fluids/Electrolytes/Nutrition:Routine in and outs. 8. Hypertension. BP well controlled- continue Norvasc 10 mg daily, clonidine 0.1 mg twice daily, Lopressor 50 mg twice daily.   D/c clonidine.  9. History of open Uretlithotomy2005 in [place] as well as left PCNL and laser ureterotomyOctober 2018 followed by Dr. Marlou Huber. 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  -CONTINUE FOLEY TUBE.DO NOT REMOVE .Foley catheter to remain in place until follow-up in the office of Dr. Modena Huber for cystogram. 10. Diet controlled diabetes mellitus. Hemoglobin A1c 5.8. SSI -Educate pt/family as needed CBG (last 3)  Recent Labs    04/15/20 2057 04/16/20 0604 04/16/20 1133  GLUCAP 132* 95 113*  Increase metformin to 500mg . D/c ISS 11.  Leukocytosis  WBCs 11.6 on 4/8   Afebrile  Continue to monitor 12.  Acute blood loss anemia  Hemoglobin 12.0 on 4/8  Continue to monitor 13.  CKD   Creatinine 1.21 on 4/8  Continue to monitor 14.  Gout - in right knee  Patient took herbal supplement at home, add voltaren gel, small effusion- may need arthrocentesis an dcorticosteroid injection if this does not improve  Colchicine started on 4/8 15.  Slow transit constipation  Bowel meds increased on 4/8  LOS: 5 days A FACE TO FACE EVALUATION WAS PERFORMED  6/8 P Anthony Huber 04/16/2020, 3:02 PM

## 2020-04-17 LAB — GLUCOSE, CAPILLARY
Glucose-Capillary: 106 mg/dL — ABNORMAL HIGH (ref 70–99)
Glucose-Capillary: 91 mg/dL (ref 70–99)
Glucose-Capillary: 130 mg/dL — ABNORMAL HIGH (ref 70–99)
Glucose-Capillary: 91 mg/dL (ref 70–99)

## 2020-04-17 MED ORDER — METHYLPHENIDATE HCL 5 MG PO TABS
5.0000 mg | ORAL_TABLET | Freq: Every day | ORAL | Status: DC
  Administered 2020-04-17 – 2020-04-18 (×2): 5 mg via ORAL
  Filled 2020-04-17 (×2): qty 1

## 2020-04-17 MED ORDER — TRAMADOL HCL 50 MG PO TABS
50.0000 mg | ORAL_TABLET | Freq: Two times a day (BID) | ORAL | Status: DC | PRN
Start: 2020-04-17 — End: 2020-04-19

## 2020-04-17 NOTE — Progress Notes (Signed)
Physical Therapy Session Note  Patient Details  Name: Anthony Huber MRN: [id number] Date of Birth: [birth year]  Today's Date: 04/17/2020 PT Individual Time: 5726-2035 PT Individual Time Calculation (min): 70 min   Short Term Goals: Week 1:  PT Short Term Goal 1 (Week 1): Pt will tolerate sitting in WC 2hour between PT PT Short Term Goal 2 (Week 1): Pt will sit EOB with mod assist x 5 mintues PT Short Term Goal 3 (Week 1): Pt will transfer with max assist of 1 to and from bed PT Short Term Goal 4 (Week 1): Pt will perform sit<>stand with max assist +2 for safety and LRAD  Skilled Therapeutic Interventions/Progress Updates:    Pt greeted supine in bed with wife at bedside - pt agreeable to therapy. No interpreter at bedside. Family not answering phone to assist. Used [employer] Interpreter with [place] language but interpreter unable to understand patient as they speak [place], not [place] Wife present throughout session and appeared to understand gestures well enough to perform interpretation.   Donned athletic shorts with totalA while threading urethral catheter bag. Needs totalA for LLE but he was able to initiate bridging in bed to assist with pulling shorts over hips. Rolled to the R with modA and needed maxA for R sidelying to sit with assist for trunk and BLE management. Needs maxA for sitting balance at EOB due to L lateral lean. Placed 6inch platform under BLE to improve sitting posture. Performed squat<>pivot transfer with totalA via over the back technique from EOB to TIS w/c and then needing totalA for repositioning in TIS w/c.   W/c transport to main rehab gym. Squat<>pivot transfer with totalA in similar manner as above to mat table. Again, needing maxA while unsupported at edge of mat due to significant trunk/core weakness. Mirror in front of him for visual feedback throughout session to improve midline and postural awareness.   NMR for remainder of session focusing on  static>dynamic sitting balance. Placed large wedge behind him to assist with stabilization with back support. Pt needing max cues to attend to the mirror to assist with visual feedback. Performed R lateral elbow leans with cues for pushing himself back up to midline and holding midline posture - pt needing modA to achieve midline but then just topples over to his L needing totalA for recovery. Placed hands in posterior prop position and pt able to sit with minA for brief periods of time. Placed small wedge with tapered end under his L hip to facilitate more upright and midline posturing which appeared to improve his sitting balance. Pt then instructed on functional reaching with RUE across midline with cone tasking task - needing maxA for sitting balance and facilitating LUE weight bearing. Pt also completed seated ball toss with unweighted ball with his RUE while therapist provided maxA guard for sitting balance. Pt able to catch ball with RUE and underhand toss it accurately ~6t away.   Squat<>pivot transfer with totalA back to his TIS w/c and wheeled back to room. He remained reclined in TIS w/c with safety belt alarm on, needs within reach, pillow supporting LUE.   Pt c/o dizziness (via gesturing) during sitting balance tasks. BP was taken and read 125/78 (93) HR 63.   Therapy Documentation Precautions:  Restrictions Weight Bearing Restrictions: No General:    Therapy/Group: Individual Therapy  Joylyn Duggin P Jahmal Dunavant PT 04/17/2020, 7:26 AM

## 2020-04-17 NOTE — Progress Notes (Signed)
Occupational Therapy Session Note  Patient Details  Name: Anthony Huber MRN: [id number] Date of Birth: July 11, 1960  Today's Date: 04/17/2020 OT Individual Time: 1304-1405 OT Individual Time Calculation (min): 61 min    Short Term Goals: Week 1:  OT Short Term Goal 1 (Week 1): pt will sit EOB wiht MAX A of 1 for 3 min in prep for ADL OT Short Term Goal 2 (Week 1): Pt will manage LUE with multimodal cuing in prep for UB dressing OT Short Term Goal 3 (Week 1): Pt will intiate during transfer training 75% of trials to demo improved command following OT Short Term Goal 4 (Week 1): Pt will groom with MOD A  Skilled Therapeutic Interventions/Progress Updates:    Pt received in TIS. This therapist received education from his nurse on how to change his foley bag to a leg bag.  Having the leg bag in therapy sessions makes it easier to mobilize the pt as tall steps often have to be used to compensate for pt's shorter leg length.   Pt transported to gym, wife present and +2 A from therapy tech. Pt transferred to mat with squat pivot of max +2.  Pt has improved from not demonstrating grabbing and pulling to try to help and has less impulsivity with the transfer.  On mat, spent a significant amount of time working on static sitting control through tactile feedback through sternum and low back. He did well with the mirror placed in front of him and copying the tech's demonstration of sitting tall.  He continued to need max A for balance with significant input through L side.  Pt leaning heavily to the left so worked on dynamic reaching to the right of reaching for sticky notes placed on his R side on mirror.   With each repetition, pt demonstrated improved activation of his trunk muscles with lengthening and extension to the R.   For perceptual feedback,  Place L arm on tray table then had pt practice moving the sticky notes from his R side to the table.   After this activity, pt demonstrated more trunk  activation and was able to sit in midline with trunk extension with no physical support (only his arm on the table)! Pt able to hold this position for over 10 minutes! During this time, worked on PROM of high volume of reps with LUE.  Moved arm in circles (as if cleaning the table), back and forth, and elb flex/ext.  Pt was then able to demonstrate trace scapular activity to slightly pull arm back and with tapping over bicep, trace bicep and some minimal horizontal abduction.  This is quite some improvement as pt has been flaccid.   Once table, removed pt was unable to hold his balance without max A.  He had also been sitting for 35 minutes.    Pt taken back to room, transferred back to bed.  Leg bag changed back to foley bag.    Bed alarm set and all needs met.  Therapy Documentation Precautions:  Restrictions Weight Bearing Restrictions: No General:   Vital Signs:   Pain:   ADL: ADL Grooming: Moderate assistance Upper Body Bathing: Maximal assistance Where Assessed-Upper Body Bathing: Bed level Lower Body Bathing: Dependent Where Assessed-Lower Body Bathing: Bed level Upper Body Dressing: Maximal assistance Where Assessed-Upper Body Dressing: Chair Lower Body Dressing: Dependent Where Assessed-Lower Body Dressing: Bed level Toileting: Dependent Where Assessed-Toileting: Bed level Toilet Transfer: Unable to assess   Therapy/Group: Individual Therapy  [place] 04/17/2020, 12:52  PM

## 2020-04-17 NOTE — Progress Notes (Signed)
[facility] PHYSICAL MEDICINE & REHABILITATION PROGRESS NOTE  Subjective/Complaints: Team conference today Continues to be very limited in therapy due to fatigue- will start Ritalin to stimulate.   ROS No CP or SOB, occ cough ( non observed) , +leg pain  Objective: Vital Signs: Blood pressure 127/76, pulse 63, temperature 97.8 F (36.6 C), temperature source Oral, resp. rate 18, height 4\' 11"  (1.499 m), weight 48.9 kg, SpO2 97 %. No results found. No results for input(s): WBC, HGB, HCT, PLT in the last 72 hours. No results for input(s): NA, K, CL, CO2, GLUCOSE, BUN, CREATININE, CALCIUM in the last 72 hours.  Intake/Output Summary (Last 24 hours) at 04/17/2020 1038 Last data filed at 04/17/2020 0900 Gross per 24 hour  Intake 460 ml  Output 800 ml  Net -340 ml       Physical Exam: BP 127/76 (BP Location: Right Arm)   Pulse 63   Temp 97.8 F (36.6 C) (Oral)   Resp 18   Ht 4\' 11"  (1.499 m)   Wt 48.9 kg   SpO2 97%   BMI 21.77 kg/m  Gen: no distress, normal appearing HEENT: oral mucosa pink and moist, NCAT Cardio: Reg rate Chest: normal effort, normal rate of breathing Abd: soft, non-distended Ext: no edema Psych: alert , delayed responses  Musc: No edema in extremities.  No tenderness in extremities. Neuro: Alert Left facial weakness RUE/RLE: 4-/5 proximal distal LUE/LE: 0/5 proximal distal Sensation - pt says LT is equal on both hands but cannot identify which finger is touched on the Left hand  Assessment/Plan: 1. Functional deficits which require 3+ hours per day of interdisciplinary therapy in a comprehensive inpatient rehab setting.  Physiatrist is providing close team supervision and 24 hour management of active medical problems listed below.  Physiatrist and rehab team continue to assess barriers to discharge/monitor patient progress toward functional and medical goals   Care Tool:  Bathing    Body parts bathed by patient: Face   Body parts bathed by  helper: Right arm,Left arm,Chest,Abdomen,Front perineal area,Buttocks,Right upper leg,Left upper leg,Right lower leg,Left lower leg,Face     Bathing assist Assist Level: Maximal Assistance - Patient 24 - 49%     Upper Body Dressing/Undressing Upper body dressing   What is the patient wearing?: Pull over shirt    Upper body assist Assist Level: Maximal Assistance - Patient 25 - 49%    Lower Body Dressing/Undressing Lower body dressing      What is the patient wearing?: Pants,Incontinence brief     Lower body assist Assist for lower body dressing: 2 Helpers     Toileting Toileting    Toileting assist Assist for toileting: Total Assistance - Patient < 25%     Transfers Chair/bed transfer  Transfers assist     Chair/bed transfer assist level: 2 Helpers     Locomotion Ambulation   Ambulation assist   Ambulation activity did not occur: Safety/medical concerns          Walk 10 feet activity   Assist  Walk 10 feet activity did not occur: Safety/medical concerns        Walk 50 feet activity   Assist Walk 50 feet with 2 turns activity did not occur: Safety/medical concerns         Walk 150 feet activity   Assist Walk 150 feet activity did not occur: Safety/medical concerns         Walk 10 feet on uneven surface  activity   Assist Walk 10  feet on uneven surfaces activity did not occur: Safety/medical concerns         Wheelchair     Assist Will patient use wheelchair at discharge?: Yes Type of Wheelchair: Manual Wheelchair activity did not occur: Safety/medical concerns         Wheelchair 50 feet with 2 turns activity    Assist    Wheelchair 50 feet with 2 turns activity did not occur: Safety/medical concerns       Wheelchair 150 feet activity     Assist  Wheelchair 150 feet activity did not occur: Safety/medical concerns        Medical Problem List and Plan: 1.Left hemiparesissecondary to right basal  ganglia intraparenchymal hemorrhage secondary to hypertensive crisis  Continue CIR PT, OT, SLP  2. Impaired mobility -DVT/anticoagulation:Continue Subcutaneous continue heparin initiated for 02/25/2020 -antiplatelet therapy: N/A 3. Pain Management:continue Tylenol as needed. Decrease tramadol to q12H prn.  4. Mood:Provide emotional support -antipsychotic agents: N/A 5. Neuropsych: This patientis notcapable of making decisions on hisown behalf. 6. Skin/Wound Care:Routine skin checks 7. Fluids/Electrolytes/Nutrition:Routine in and outs. 8. Hypertension. BP well controlled- continue Norvasc 10 mg daily, clonidine 0.1 mg twice daily, Lopressor 50 mg twice daily.   D/c clonidine.  9. History of open Uretlithotomy2005 in [place] as well as left PCNL and laser ureterotomyOctober 2018 followed by Dr. Marlou Porch. 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  -CONTINUE FOLEY TUBE.DO NOT REMOVE .Foley catheter to remain in place until follow-up in the office of Dr. Modena Slater for cystogram. 10. Diet controlled diabetes mellitus. Hemoglobin A1c 5.8. SSI -Educate pt/family as needed CBG (last 3)  Recent Labs    04/16/20 1655 04/16/20 2103 04/17/20 0646  GLUCAP 115* 103* 106*  Increase metformin to 500mg . D/c ISS 11.  Leukocytosis  WBCs 11.6 on 4/8   Afebrile  Continue to monitor 12.  Acute blood loss anemia  Hemoglobin 12.0 on 4/8  Continue to monitor 13.  CKD   Creatinine 1.21 on 4/8  Continue to monitor 14.  Gout - in right knee  Patient took herbal supplement at home, add voltaren gel, small effusion- may need arthrocentesis an dcorticosteroid injection if this does not improve  Colchicine started on 4/8 15.  Slow transit constipation  Bowel meds increased on 4/8 16. Lethargy: start ritalin 5mg  daily  LOS: 6 days A FACE TO FACE EVALUATION WAS  PERFORMED  6/8 Mellissa Conley 04/17/2020, 10:38 AM

## 2020-04-17 NOTE — Progress Notes (Signed)
Speech Language Pathology Daily Session Note  Patient Details  Name: Anthony Huber MRN: [id number] Date of Birth: [birth year]  Today's Date: 04/17/2020 SLP Individual Time: 1062-6948 SLP Individual Time Calculation (min): 45 min  Short Term Goals: Week 1: SLP Short Term Goal 1 (Week 1): Patient will maintain attention to basic level function tasks for increments of 1-2 minutes with modA cues SLP Short Term Goal 2 (Week 1): Patient will perform basic level functional tasks with maxA cues for initiation. SLP Short Term Goal 3 (Week 1): Patient will respond to open ended questions at least 75% of the time with modA cues. SLP Short Term Goal 4 (Week 1): Patient will tolerate regular solids, thin liquids diet without overt s/s aspiration or penetration.  Skilled Therapeutic Interventions:    Patient seen for skilled ST session with focus on cognitive-linguistic goals. Wife was present in room as well as live interpreter. Patient very alert and responds promptly. He was able to describe verb photos with 70% accuracy and delay (partly due to his difficulty with vision). He named objects with 90% accuracy and describe object function with 75% accuracy. He required frequent cues to redirect during semi-structured conversation. Patient continues to benefit from skilled SLP intervention to maximize cognitive-linguistic function prior to discharge.  Pain Pain Assessment Pain Scale: Faces Pain Score: 0-No pain Faces Pain Scale: No hurt  Therapy/Group: Individual Therapy  Angela Nevin, MA, CCC-SLP Speech Therapy

## 2020-04-17 NOTE — Patient Care Conference (Signed)
Inpatient RehabilitationTeam Conference and Plan of Care Update Date: 04/17/2020   Time: 11:18 AM    Patient Name: Anthony Huber      Medical Record Number: [id number]  Date of Birth: 1960/08/24 Sex: Male         Room/Bed: 4W20C/4W20C-01 Payor Info: Payor: BLUE CROSS BLUE [employer]IELD / Plan: BCBS COMM PPO / Product Type: *No Product type* /    Admit Date/Time:  04/11/2020  2:09 PM  Primary Diagnosis:  ICH (intracerebral hemorrhage) [facility])  Hospital Problems: Principal Problem:   ICH (intracerebral hemorrhage) (HCC) Active Problems:   Slow transit constipation   Chronic gout of right knee   Acute blood loss anemia   Leukocytosis    Expected Discharge Date: Expected Discharge Date: 05/17/20  Team Members Present: Physician leading conference: Dr. Sula Soda Care Coodinator Present: Chana Bode, RN, BSN, CRRN;Becky Dupree, LCSW Nurse Present: Chana Bode, RN PT Present: Wynelle Link, PT OT Present: Primitivo Gauze, OT SLP Present: Elio Forget, SLP PPS Coordinator present : Fae Pippin, SLP     Current Status/Progress Goal Weekly Team Focus  Bowel/Bladder   Foley cath, continent bowel LBM 4/12  Remain continent bowel, maintain foley- no s/s of infection  Assess bowels and offer laxatives PRN, q shift foley care   Swallow/Nutrition/ Hydration   Regular textures and thin liquids, min-supervision  Supervision A  tolerance of diet, continued alertness   ADL's   total A of 2 with self care and mobility  min - mod A overall  balance, postural control, L side and midline awareness, pt/fam ed   Mobility   total +2 for all mobility (pt is small, 4'11, and able to manage with 1 person sometimes)  modA overall. Will likely be w/c level  Sitting balance, L hemibody NMR, functional transfers   Communication   max-mod A phrase level, clarity  Min A  picture descriptions, expressing wants/needs in cohesive manner, 1 step commands   Safety/Cognition/ Behavioral Observations  Max- Mod  A, improved sustained attention  MIn A  sustained attention, basic problem solving, initation, error awareness   Pain   Pt reports pain to Rt knee- scheduled voltaren, PRN tramadol/tylenol  less than 3 out of 10  assess q shift and PRN.   Skin   dry and intact  remain free of breakdown  assess skin q shift and PRN     Discharge Planning:  Home with wife who will be his main caregiver, son to assist when not working. He does not have the option to go to SNF due to coming here   Team Discussion: MD treated DM with metformin, Colchicine for gout CKD is controlled. Progress limited by dizziness, blurred vision, flaccidity, left neglect and delayed righting reaction and lethary. MD to add ritalin. Patient with poor dietary intake and ate well prior to hemorrhage.  Patient on target to meet rehab goals: Currently max assist for mobility, max - total assist for ADLs. Requires Max assist to get EOB and trunk/back support. Mod-  Max assist for initiation and problem solving. Staff do not anticipate patient will be ambulatory at discharge.  *See Care Plan and progress notes for long and short-term goals.   Revisions to Treatment Plan:   Teaching Needs: Ongoing education with wife Transfers, toileting, foley care, medications, secondary stroke risk management, etc.   Current Barriers to Discharge: Decreased caregiver support, Home enviroment access/layout and Insurance for SNF coverage Son works during the day  Possible Resolutions to Barriers: Family education  Medical Summary Current Status: lethargic, blurry vision, blood pressure has been well controlled, elevated CBGs, gout  Barriers to Discharge: Medical stability  Barriers to Discharge Comments: lethargic, blurry vision, HTN, elevated CBGs, gout Possible Resolutions to [employer] Focus: start ritalin, stop clonidine and continue other BP medications, start metformin, continue colchicine   Continued Need for Acute  Rehabilitation Level of Care: The patient requires daily medical management by a physician with specialized training in physical medicine and rehabilitation for the following reasons: Direction of a multidisciplinary physical rehabilitation program to maximize functional independence : Yes Medical management of patient stability for increased activity during participation in an intensive rehabilitation regime.: Yes Analysis of laboratory values and/or radiology reports with any subsequent need for medication adjustment and/or medical intervention. : Yes   I attest that I was present, lead the team conference, and concur with the assessment and plan of the team.   Chana Bode B 04/17/2020, 2:21 PM

## 2020-04-17 NOTE — Progress Notes (Signed)
Patient ID: Anthony Huber, male   DOB: 1960-11-30, 60 y.o.   MRN: [id number]  Spoke with son-Anthony Huber via telephone to inform of team conference goals min-mod wheelchair level and target discharge ate 5/13. Son is not aware who much assist pt requires and asked questions regarding reason could not walk here. Encouraged him to come in and attend therapies with pt to see his current function and ask his questions. Discussed main goal is to make sure his Mom can manage Dad at home at the level he will be at. Have left Dad's employer paperwork in the room for him, he is aware of this. Continue to work on discharge needs.

## 2020-04-18 LAB — GLUCOSE, CAPILLARY
Glucose-Capillary: 97 mg/dL (ref 70–99)
Glucose-Capillary: 111 mg/dL — ABNORMAL HIGH (ref 70–99)
Glucose-Capillary: 93 mg/dL (ref 70–99)
Glucose-Capillary: 92 mg/dL (ref 70–99)

## 2020-04-18 MED ORDER — METFORMIN HCL 500 MG PO TABS
500.0000 mg | ORAL_TABLET | Freq: Two times a day (BID) | ORAL | Status: DC
  Administered 2020-04-18 – 2020-04-20 (×4): 500 mg via ORAL
  Filled 2020-04-18 (×5): qty 1

## 2020-04-18 MED ORDER — METHYLPHENIDATE HCL 5 MG PO TABS
5.0000 mg | ORAL_TABLET | Freq: Two times a day (BID) | ORAL | Status: DC
  Administered 2020-04-19 – 2020-05-17 (×57): 5 mg via ORAL
  Filled 2020-04-18 (×56): qty 1

## 2020-04-18 NOTE — Progress Notes (Signed)
Speech Language Pathology Daily Session Note  Patient Details  Name: Anthony Huber MRN: [id number] Date of Birth: [birth year]  Today's Date: 04/18/2020 SLP Individual Time: 2725-3664 SLP Individual Time Calculation (min): 55 min  Short Term Goals: Week 1: SLP Short Term Goal 1 (Week 1): Patient will maintain attention to basic level function tasks for increments of 1-2 minutes with modA cues SLP Short Term Goal 2 (Week 1): Patient will perform basic level functional tasks with maxA cues for initiation. SLP Short Term Goal 3 (Week 1): Patient will respond to open ended questions at least 75% of the time with modA cues. SLP Short Term Goal 4 (Week 1): Patient will tolerate regular solids, thin liquids diet without overt s/s aspiration or penetration.  Skilled Therapeutic Interventions:   Patient seen for skilled ST session focusing on cognitive-linguistic goals. Wife and interpreter both present in room. Patient did appear a little less talkative today and his wife reported that he has been having difficulty sleeping at night because of pain in right leg and some whole body pain. When working on orientation to time/date patient was able to read large print numbers from digital clock but not smaller print. He reportedly wears glasses which are not present, in addition to his stroke-related visual impairments. He named objects with 80% accuracy and required min-mod cues at times for naming after first describing/gesturing object use. Patient was able to describe his daily work and home routine but required cues for elaborating. 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 He reported that when not working, he would sleep, read, look at his phone. He reported that he was trying to read/learn some [place]. He did not identify any interests or hobbies. He and his wife both reported that he has had some difficulties with chewing foods at times but  that this was occurring prior to his hospitalization. SLP will focus more on this next visit. Patient continues to benefit from skilled SLP intervention to maximize cognitive-linguistic function prior to discharge.  Pain Pain Assessment Pain Scale: Faces Pain Score: 0-No pain Faces Pain Scale: No hurt  Therapy/Group: Individual Therapy  Angela Nevin, MA, CCC-SLP Speech Therapy

## 2020-04-18 NOTE — Progress Notes (Signed)
Physical Therapy Weekly Progress Note  Patient Details  Name: Anthony Huber MRN: [id number] Date of Birth: 1960/06/30  Beginning of progress report period: April 12, 2020 End of progress report period: April 18, 2020  Today's Date: 04/18/2020 PT Individual Time: 1300-1400 PT Individual Time Calculation (min): 60 min   Patient has met 2 of 4 short term goals.  Pt is making slow progress towards his goals. He is able to roll towards his R with modA with bed features, requires totalA for supine<>sit with bed features. With back support, pt is able to sit with mod/maxA but without back support, he requires totalA. He requires totalA for squat<>pivot transfers as well. He has been sitting >2 hours outside of therapy in TIS w/c and shows great participation with therapy and appears very motivated to regain functional independence.   Patient continues to demonstrate the following deficits muscle weakness, decreased cardiorespiratoy endurance, impaired timing and sequencing, unbalanced muscle activation, motor apraxia, decreased coordination and decreased motor planning, decreased visual acuity and decreased visual perceptual skills, decreased midline orientation, decreased attention to left, decreased motor planning and ideational apraxia, decreased initiation, decreased attention, decreased awareness, decreased problem solving and decreased safety awareness and decreased sitting balance, decreased standing balance, decreased postural control, hemiplegia and decreased balance strategies and therefore will continue to benefit from skilled PT intervention to increase functional independence with mobility.  Patient progressing toward long term goals..Continue plan of care.  PT Short Term Goals Week 1:  PT Short Term Goal 1 (Week 1): Pt will tolerate sitting in WC 2hour between PT PT Short Term Goal 2 (Week 1): Pt will sit EOB with mod assist x 5 mintues PT Short Term Goal 3 (Week 1): Pt will transfer with max  assist of 1 to and from bed PT Short Term Goal 4 (Week 1): Pt will perform sit<>stand with max assist +2 for safety and LRAD Week 2:  PT Short Term Goal 1 (Week 2): Pt will complete supine<>sit with modA and use of hospital bed features PT Short Term Goal 2 (Week 2): Pt will sit unsupported with modA x10 minutes PT Short Term Goal 3 (Week 2): Pt will perform bed<>chair transfer with maxA of 1 person  Skilled Therapeutic Interventions/Progress Updates:    Pt greeted supine in bed, agreeable to therapy. In-person interpreter and wife at bedside. Pt reports no pain but does endorse mild tingling in L arm which as been present since arrival to rehab unit. Pt completed supine<>sit with modA (!) with use of hospital bed features. Placed 6inch platform under feet for better support as pt is 4'11. Requires maxA for sitting balance at EOB and requires maxA +2 from squat<>pivot transfer from EOB to TIS w/c. W/c transport for time management to main rehab gym .  Wheeled in // bars and ace wrapped LUE to bar. Sit<>stand with mod/maxA with L knee block - pt demo's heavy L lateral lean in standing. With mirror in front of him for visual feedback, provided multi-modal cueing to improve erect posture and midline awareness while maintain L knee block and facilitating these deficits. Pt with decreased ability to correct and initiate corrections but was able to stand for ~1-2 minutes with maxA in // bars.  Performed squat<>pivot transfer with maxA +2 from TIS w/c to mat table (again, using 6inch platform). Pt demonstrating improved ability to initiate transfer. While seated edge of mat, completed NMR for both static and dynamic sitting balance. Instructed on targeted reaching with RUE for cone stacking -  targets focusing on ipsilateral reaching and midline reaching - pt with large LOB to the L while doing this and required max/totalA for balance recoveries. Pt then instructed on just focusing on static sitting balance with  BUE in lap - with cues and time, pt demonstrated ability to sit unsupported at mat table with VERY close supervision for 2-3 minute bouts! He then performed lateral elbow leans to the R and modified crunches from large wedge (therapist posteriorly on wedge). Pt completed NMR with seated ball toss to tech with mod/maxA for siting balance while using his RUE to catch/toss ball.   Squat<>pivot transfer with maxA +2 back to his TIS w/c in similar manner as above. Transported back to room and pt requesting to remain seated in TIS w/c. Safety belt alarm on and needs within reach. Wife at bedside. NT informed of maximove for nursing to transfer.   Therapy Documentation Precautions:  Restrictions Weight Bearing Restrictions: No General:    Therapy/Group: Individual Therapy  Nyima Vanacker P Jerlene Rockers PT 04/18/2020, 7:38 AM

## 2020-04-18 NOTE — Progress Notes (Signed)
[place] PHYSICAL MEDICINE & REHABILITATION PROGRESS NOTE  Subjective/Complaints: No complaints this morning Working with Amil Amen OT Denies pain Given impaired initiation, will increase Ritalin to BID  ROS No CP or SOB, occ cough ( non observed), pain  Objective: Vital Signs: Blood pressure 134/78, pulse 67, temperature 98 F (36.7 C), temperature source Oral, resp. rate 18, height 4\' 11"  (1.499 m), weight 48.9 kg, SpO2 96 %. No results found. No results for input(s): WBC, HGB, HCT, PLT in the last 72 hours. No results for input(s): NA, K, CL, CO2, GLUCOSE, BUN, CREATININE, CALCIUM in the last 72 hours.  Intake/Output Summary (Last 24 hours) at 04/18/2020 1247 Last data filed at 04/18/2020 0715 Gross per 24 hour  Intake 200 ml  Output 1300 ml  Net -1100 ml       Physical Exam: BP 134/78 (BP Location: Right Arm)   Pulse 67 Comment: PULSE RE TAKEN  Temp 98 F (36.7 C) (Oral)   Resp 18   Ht 4\' 11"  (1.499 m)   Wt 48.9 kg   SpO2 96%   BMI 21.77 kg/m  Gen: no distress, normal appearing HEENT: oral mucosa pink and moist, NCAT Cardio: Reg rate Chest: normal effort, normal rate of breathing Abd: soft, non-distended Ext: no edema Psych: alert , delayed responses  Musc: No edema in extremities.  No tenderness in extremities. Neuro: Alert Left facial weakness RUE/RLE: 4-/5 proximal distal LUE/LE: 0/5 proximal distal Sensation - pt says LT is equal on both hands but cannot identify which finger is touched on the Left hand  Assessment/Plan: 1. Functional deficits which require 3+ hours per day of interdisciplinary therapy in a comprehensive inpatient rehab setting.  Physiatrist is providing close team supervision and 24 hour management of active medical problems listed below.  Physiatrist and rehab team continue to assess barriers to discharge/monitor patient progress toward functional and medical goals   Care Tool:  Bathing    Body parts bathed by patient:  Face,Chest,Abdomen,Left arm   Body parts bathed by helper: Right arm,Left arm,Chest,Abdomen,Front perineal area,Buttocks,Right upper leg,Left upper leg,Right lower leg,Left lower leg,Face     Bathing assist Assist Level: Maximal Assistance - Patient 24 - 49%     Upper Body Dressing/Undressing Upper body dressing   What is the patient wearing?: Pull over shirt    Upper body assist Assist Level: Maximal Assistance - Patient 25 - 49%    Lower Body Dressing/Undressing Lower body dressing      What is the patient wearing?: Pants,Incontinence brief     Lower body assist Assist for lower body dressing: Maximal Assistance - Patient 25 - 49% (bed level)     Toileting Toileting    Toileting assist Assist for toileting: Total Assistance - Patient < 25%     Transfers Chair/bed transfer  Transfers assist     Chair/bed transfer assist level: 2 Helpers     Locomotion Ambulation   Ambulation assist   Ambulation activity did not occur: Safety/medical concerns          Walk 10 feet activity   Assist  Walk 10 feet activity did not occur: Safety/medical concerns        Walk 50 feet activity   Assist Walk 50 feet with 2 turns activity did not occur: Safety/medical concerns         Walk 150 feet activity   Assist Walk 150 feet activity did not occur: Safety/medical concerns         Walk 10 feet on uneven  surface  activity   Assist Walk 10 feet on uneven surfaces activity did not occur: Safety/medical concerns         Wheelchair     Assist Will patient use wheelchair at discharge?: Yes Type of Wheelchair: Manual Wheelchair activity did not occur: Safety/medical concerns         Wheelchair 50 feet with 2 turns activity    Assist    Wheelchair 50 feet with 2 turns activity did not occur: Safety/medical concerns       Wheelchair 150 feet activity     Assist  Wheelchair 150 feet activity did not occur: Safety/medical  concerns        Medical Problem List and Plan: 1.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 Impaired mobility -DVT/anticoagulation:Continue Subcutaneous continue heparin initiated for 02/25/2020 -antiplatelet therapy: N/A 3. Pain Management:continue Tylenol as needed. Decrease tramadol to q12H prn.  4. Mood:Provide emotional support -antipsychotic agents: N/A 5. Neuropsych: This patientis notcapable of making decisions on hisown behalf. 6. Skin/Wound Care:Routine skin checks 7. Fluids/Electrolytes/Nutrition:Routine in and outs. 8. Hypertension. BP well controlled- continue Norvasc 10 mg daily, clonidine 0.1 mg twice daily, Lopressor 50 mg twice daily.   D/c clonidine.  9. History of open Uretlithotomy2005 in [place] as well as left PCNL and laser ureterotomyOctober 2018 followed by Dr. Marlou Porch. 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  -CONTINUE FOLEY TUBE.DO NOT REMOVE .Foley catheter to remain in place until follow-up in the office of Dr. Modena Slater for cystogram. 10. Diet controlled diabetes mellitus. Hemoglobin A1c 5.8. SSI -Educate pt/family as needed CBG (last 3)  Recent Labs    04/17/20 2106 04/18/20 0608 04/18/20 1140  GLUCAP 91 97 111*  increase metformin to 500mg  BID 11.  Leukocytosis  WBCs 11.6 on 4/8   Afebrile  Continue to monitor 12.  Acute blood loss anemia  Hemoglobin 12.0 on 4/8  Continue to monitor 13.  CKD   Creatinine 1.21 on 4/8  Continue to monitor 14.  Gout - in right knee  Improved- d/c colchicine 15.  Slow transit constipation  Bowel meds increased on 4/8 16. Lethargy: increase ritalin to 5mg  BID  LOS: 7 days A FACE TO FACE EVALUATION WAS PERFORMED  6/8 Anthony Huber 04/18/2020, 12:47 PM

## 2020-04-18 NOTE — Progress Notes (Signed)
Occupational Therapy Session Note  Patient Details  Name: Barkley Kratochvil MRN: [id number] Date of Birth: 10-06-1960  Today's Date: 04/18/2020 OT Individual Time: 0900-1000 OT Individual Time Calculation (min): 60 min    Short Term Goals: Week 1:  OT Short Term Goal 1 (Week 1): pt will sit EOB wiht MAX A of 1 for 3 min in prep for ADL OT Short Term Goal 2 (Week 1): Pt will manage LUE with multimodal cuing in prep for UB dressing OT Short Term Goal 3 (Week 1): Pt will intiate during transfer training 75% of trials to demo improved command following OT Short Term Goal 4 (Week 1): Pt will groom with MOD A  Skilled Therapeutic Interventions/Progress Updates:    Pt received in bed with wife present.  Used Scientist, research (physical sciences).  From bed (no +2 available) worked on bridging with LB dressing having pt work on removing his R leg from old shorts and placing in new shorts with max cues. During bridge with A for L hip, pt able to partially pull shorts over hips.  Place pt in chair position in bed for UB self care.  Max cues to follow through with steps and attend to task. Max A overall.    Rest of session focused on facilitation of LUE and core strength. Placed pt in supine with bed flat. Worked on hand drop activity with max cues to visually attend to hand. He was able to elicit triceps enough to hold hand up for a max of 10 seconds. Worked on bridges with isometric holds of 4 seconds, 10 reps with max facilitation initially through L hip progressing to min A.  Later on when pt had to lift hips for placement of bed pad, he just lifted his hips without A!  Knee drops side to side with max A for LLE to facilitate core strength, although pt kept trying to use his upper body to get more movement.  Sat pt back in chair position and used powder board on his lap with his L hand on a pillow case.  PROM of LUE in multiple ranges progressing to a/arom to facilitate movement. He did have trace horizontal abduction and able  to slide hand across board 3x.    Pt adjusted in bed in comfortable position, alarm set and all needs met.   Therapy Documentation Precautions:  Restrictions Weight Bearing Restrictions: No Pain: Pain Assessment Pain Score: 0-No pain ADL: ADL Grooming: Moderate assistance Upper Body Bathing: Maximal assistance Where Assessed-Upper Body Bathing: Bed level Lower Body Bathing: Dependent Where Assessed-Lower Body Bathing: Bed level Upper Body Dressing: Maximal assistance Where Assessed-Upper Body Dressing: Chair Lower Body Dressing: Dependent Where Assessed-Lower Body Dressing: Bed level Toileting: Dependent Where Assessed-Toileting: Bed level Toilet Transfer: Unable to assess   Therapy/Group: Individual Therapy  Gardner 04/18/2020, 10:15 AM

## 2020-04-19 LAB — GLUCOSE, CAPILLARY
Glucose-Capillary: 89 mg/dL (ref 70–99)
Glucose-Capillary: 96 mg/dL (ref 70–99)
Glucose-Capillary: 122 mg/dL — ABNORMAL HIGH (ref 70–99)
Glucose-Capillary: 108 mg/dL — ABNORMAL HIGH (ref 70–99)

## 2020-04-19 MED ORDER — TRAMADOL HCL 50 MG PO TABS
50.0000 mg | ORAL_TABLET | Freq: Every day | ORAL | Status: DC
  Administered 2020-04-19 – 2020-04-20 (×2): 50 mg via ORAL
  Filled 2020-04-19 (×2): qty 1

## 2020-04-19 NOTE — Progress Notes (Signed)
[facility]ICAL MEDICINE [facility]ABILITATION PROGRESS NOTE  Subjective/Complaints: No complaints this morning Wife is at bedside Patient's chart reviewed- No issues reported overnight Vitals signs stable  ROS Denies CP or SOB, occ cough ( non observed), pain  Objective: Vital Signs: Blood pressure 128/82, pulse 63, temperature 98.3 F (36.8 C), resp. rate 18, height 4\' 11"  (1.499 m), weight 48.9 kg, SpO2 96 %. No results found. No results for input(s): WBC, HGB, HCT, PLT in the last 72 hours. No results for input(s): NA, K, CL, CO2, GLUCOSE, BUN, CREATININE, CALCIUM in the last 72 hours.  Intake/Output Summary (Last 24 hours) at 04/19/2020 1025 Last data filed at 04/19/2020 0720 Gross per 24 hour  Intake 10 ml  Output 1050 ml  Net -1040 ml       Physical Exam: BP 128/82 (BP Location: Right Arm)   Pulse 63   Temp 98.3 F (36.8 C)   Resp 18   Ht 4\' 11"  (1.499 m)   Wt 48.9 kg   SpO2 96%   BMI 21.77 kg/m  Gen: no distress, normal appearing HEENT: oral mucosa pink and moist, NCAT Cardio: Reg rate Chest: normal effort, normal rate of breathing Abd: soft, non-distended Ext: no edema Psych: alert , delayed responses  Musc: No edema in extremities.  No tenderness in extremities. Neuro: Alert Left facial weakness RUE/RLE: 4-/5 proximal distal LUE/LE: 0/5 proximal distal Sensation - pt says LT is equal on both hands but cannot identify which finger is touched on the Left hand  Assessment/Plan: 1. Functional deficits which require 3+ hours per day of interdisciplinary therapy in a comprehensive inpatient rehab setting.  Physiatrist is providing close team supervision and 24 hour management of active medical problems listed below.  Physiatrist and rehab team continue to assess barriers to discharge/monitor patient progress toward functional and medical goals   Care Tool:  Bathing    Body parts bathed by patient: Face,Chest,Abdomen,Left arm   Body parts bathed by  helper: Right arm,Left arm,Chest,Abdomen,Front perineal area,Buttocks,Right upper leg,Left upper leg,Right lower leg,Left lower leg,Face     Bathing assist Assist Level: Maximal Assistance - Patient 24 - 49%     Upper Body Dressing/Undressing Upper body dressing   What is the patient wearing?: Pull over shirt    Upper body assist Assist Level: Maximal Assistance - Patient 25 - 49%    Lower Body Dressing/Undressing Lower body dressing      What is the patient wearing?: Pants,Incontinence brief     Lower body assist Assist for lower body dressing: Maximal Assistance - Patient 25 - 49% (bed level)     Toileting Toileting    Toileting assist Assist for toileting: Total Assistance - Patient < 25%     Transfers Chair/bed transfer  Transfers assist     Chair/bed transfer assist level: 2 Helpers     Locomotion Ambulation   Ambulation assist   Ambulation activity did not occur: Safety/medical concerns          Walk 10 feet activity   Assist  Walk 10 feet activity did not occur: Safety/medical concerns        Walk 50 feet activity   Assist Walk 50 feet with 2 turns activity did not occur: Safety/medical concerns         Walk 150 feet activity   Assist Walk 150 feet activity did not occur: Safety/medical concerns         Walk 10 feet on uneven surface  activity   Assist Walk  10 feet on uneven surfaces activity did not occur: Safety/medical concerns         Wheelchair     Assist Will patient use wheelchair at discharge?: Yes Type of Wheelchair: Manual Wheelchair activity did not occur: Safety/medical concerns         Wheelchair 50 feet with 2 turns activity    Assist    Wheelchair 50 feet with 2 turns activity did not occur: Safety/medical concerns       Wheelchair 150 feet activity     Assist  Wheelchair 150 feet activity did not occur: Safety/medical concerns        Medical Problem List and  Plan: 1.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 Impaired mobility -DVT/anticoagulation:Continue Subcutaneous continue heparin initiated for 02/25/2020 -antiplatelet therapy: N/A 3. Pain Management:continue Tylenol as needed. Decrease tramadol to daily prn.  4. Mood:Provide emotional support -antipsychotic agents: N/A 5. Neuropsych: This patientis notcapable of making decisions on hisown behalf. 6. Skin/Wound Care:Routine skin checks 7. Fluids/Electrolytes/Nutrition:Routine in and outs. 8. Hypertension. BP well controlled- continue Norvasc 10 mg daily, clonidine 0.1 mg twice daily, Lopressor 50 mg twice daily.   D/c clonidine.  9. History of open Uretlithotomy2005 in [place] as well as left PCNL and laser ureterotomyOctober 2018 followed by Dr. Marlou Porch. 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  -CONTINUE FOLEY TUBE.DO NOT REMOVE .Foley catheter to remain in place until follow-up in the office of Dr. Modena Slater for cystogram. 10. Diet controlled diabetes mellitus. Hemoglobin A1c 5.8. SSI -Educate pt/family as needed CBG (last 3)  Recent Labs    04/18/20 1624 04/18/20 2134 04/19/20 0625  GLUCAP 93 92 89  increase metformin to 500mg  BID- excellent control with this regimen. D/c CBGs.  11.  Leukocytosis  WBCs 11.6 on 4/8   Afebrile  Continue to monitor 12.  Acute blood loss anemia  Hemoglobin 12.0 on 4/8  Continue to monitor 13.  CKD   Creatinine 1.21 on 4/8  Continue to monitor 14.  Gout - in right knee  Improved- d/c colchicine 15.  Slow transit constipation  Bowel meds increased on 4/8 16. Lethargy: increase ritalin to 5mg  BID  LOS: 8 days A FACE TO FACE EVALUATION WAS PERFORMED  6/8 P Anthony Huber 04/19/2020, 10:25 AM

## 2020-04-19 NOTE — Progress Notes (Signed)
Speech Language Pathology Weekly Progress and Session Note  Patient Details  Name: Anthony Huber MRN: [id number] Date of Birth: Dec 20, 1960  Beginning of progress report period: 04/12/2020 End of progress report period: 04/19/2020  Today's Date: 04/19/2020 SLP Individual Time: 8184-0375 SLP Individual Time Calculation (min): 45 min  Short Term Goals: Week 1: SLP Short Term Goal 1 (Week 1): Patient will maintain attention to basic level function tasks for increments of 1-2 minutes with modA cues SLP Short Term Goal 1 - Progress (Week 1): Met SLP Short Term Goal 2 (Week 1): Patient will perform basic level functional tasks with maxA cues for initiation. SLP Short Term Goal 2 - Progress (Week 1): Met SLP Short Term Goal 3 (Week 1): Patient will respond to open ended questions at least 75% of the time with modA cues. SLP Short Term Goal 3 - Progress (Week 1): Met SLP Short Term Goal 4 (Week 1): Patient will tolerate regular solids, thin liquids diet without overt s/s aspiration or penetration. SLP Short Term Goal 4 - Progress (Week 1): Partly met    New Short Term Goals: Week 2: SLP Short Term Goal 1 (Week 2): Patient will tolerate regular texture solids without significant c/o fatigue from mastication with supervisionA SLP Short Term Goal 2 (Week 2): Patient will perform basic level functional problem solving tasks with modA cues. SLP Short Term Goal 3 (Week 2): Patient will initiate during conversation and functional tasks with minA cues to perform consistently SLP Short Term Goal 4 (Week 2): Patient will demonstrate recall of daily events (therapeutic and nursing) with minA cues. SLP Short Term Goal 5 (Week 2): Patient will demonstrate adequate selective attention during tasks performance with modA cues.  Weekly Progress Updates:  Patient has made very good progress and met all of his conservatively set goals. He continues to require cues to attention, initiation and active problem solving and  recall. He is tolerating current diet but wife and patient have recently reported he gets fatigued chewing solids (this seems to be something occurring prior to but SLP will address).    Intensity: Minumum of 1-2 x/day, 30 to 90 minutes Frequency: 3 to 5 out of 7 days Duration/Length of Stay: 5/13 Treatment/Interventions: Environmental controls;Functional tasks;Speech/Language facilitation;Patient/family education;Internal/external aids;Cueing hierarchy;Cognitive remediation/compensation   Daily Session  Skilled Therapeutic Interventions: Patient seen to address swallow and cognitive goals. He did exhibit prolonged mastication with regular solids but did not exhibit any overt s/s aspiration or penetration and he did not appear to be getting significantly fatigued. (SLP to continue to assess this). He was not as alert and talkative as he was in previous sessions especially earlier this week. He denied being tired, but did require modA cues to initiate responses. He would frequently have his eye gaze to the left and required cues to shift gaze to midline. Patient continues to benefit from skilled SLP to maximize cognitive linguistic and swallow function prior to discharge.    General    Pain    Therapy/Group: Individual Therapy  Sonia Baller, MA, CCC-SLP Speech Therapy

## 2020-04-19 NOTE — Progress Notes (Signed)
Occupational Therapy Weekly Progress Note  Patient Details  Name: Anthony Huber MRN: [id number] Date of Birth: March 06, 1960  Beginning of progress report period: April 12, 2020 End of progress report period: April 19, 2020  Today's Date: 04/19/2020 OT Individual Time: 1305-1400 OT Individual Time Calculation (min): 55 min    Patient has met 3 of 4 short term goals.  Pt is making gradual progress. He is developing more trunk control to be able to sit for short periods of time with RUE support and close S. He is also developing some active movement in his pecs, triceps and fingers. He can now lightly squeeze an object.   Patient continues to demonstrate the following deficits: abnormal tone and decreased motor planning, decreased visual perceptual skills and decreased visual motor skills, decreased attention to left and decreased sitting balance, decreased postural control, hemiplegia and decreased balance strategies and therefore will continue to benefit from skilled OT intervention to enhance overall performance with BADL and Reduce care partner burden.  Patient progressing toward long term goals..  Continue plan of care.  OT Short Term Goals Week 1:  OT Short Term Goal 1 (Week 1): pt will sit EOB wiht MAX A of 1 for 3 min in prep for ADL OT Short Term Goal 1 - Progress (Week 1): Met OT Short Term Goal 2 (Week 1): Pt will manage LUE with multimodal cuing in prep for UB dressing OT Short Term Goal 2 - Progress (Week 1): Progressing toward goal OT Short Term Goal 3 (Week 1): Pt will intiate during transfer training 75% of trials to demo improved command following OT Short Term Goal 3 - Progress (Week 1): Met OT Short Term Goal 4 (Week 1): Pt will groom with MOD A OT Short Term Goal 4 - Progress (Week 1): Met Week 2:  OT Short Term Goal 1 (Week 2): Pt will be able to sit EOB with mod A in prep for self care. OT Short Term Goal 2 (Week 2): Pt will demonstrate improved L side awareness with bathing  UB with min a. OT Short Term Goal 3 (Week 2): Pt will demonstrate improved apraxia to don shirt with mod a. OT Short Term Goal 4 (Week 2): Pt will transfer bed>< wc with mod A of 2.  Skilled Therapeutic Interventions/Progress Updates:    Pt received in bed with interpretor and wife present. No +2 A available this session.  Pt seen for NMR to facilitate movement for prefunctional skills.  Bed level: bridging and holding bridge with improved isometric holds and improved lift of hips, pt able to bridge and push through legs to move self up in bed. Rolling onto R side completely and sitting up with mod A using his R arm to push up.  Sitting: pt initially leaning heavily to the L requiring Max A.  Had pt hold onto bed rail with R hand for support. Spent time working on holding midline with hand on rail.  For wt shifting and reaching, had pt work on sliding hand up and down mattress toward head of bed with goal of "lean R, come back to center". Pt did quite well with this exercise as he achieved the goal of the exercise numerous time. Progressed to having his hand placed directly on mattress to his R but not pushing down, he was able to hold a static position upright in midline for 5 minutes with S.  Then tried with R hand on lap.  He could not hold midline even with  numerous tactile and verbal cues.  His eyes do track to the L but often he is not visually focusing on the target he is cued to look at for balance feedback.    LUE: in sitting,using a board pt worked on numerous reps of PROM in various planes progressing to have pt initiate movement. Today we observed increased activity of pecs, triceps, sh flexion and even grasp!  He was able to close in fingers actively to scrunch a pillow case and was able to squeeze a roll of ACE wrap.  Pt worked very hard this session. The interpretor was very helpful and his wife was actively involved.    Therapy Documentation Precautions:  Restrictions Weight Bearing  Restrictions: No     Pain: no c/o pain    ADL: ADL Grooming: Moderate assistance Upper Body Bathing: Maximal assistance Where Assessed-Upper Body Bathing: Bed level Lower Body Bathing: Dependent Where Assessed-Lower Body Bathing: Bed level Upper Body Dressing: Maximal assistance Where Assessed-Upper Body Dressing: Chair Lower Body Dressing: Dependent Where Assessed-Lower Body Dressing: Bed level Toileting: Dependent Where Assessed-Toileting: Bed level Toilet Transfer: Unable to assess   Therapy/Group: Individual Therapy  [place] 04/19/2020, 2:10 PM

## 2020-04-19 NOTE — Progress Notes (Signed)
Physical Therapy Session Note  Patient Details  Name: Anthony Huber MRN: [id number] Date of Birth: 06-29-1960  Today's Date: 04/19/2020 PT Individual Time: 0800-0855 PT Individual Time Calculation (min): 55 min   Short Term Goals: Week 1:  PT Short Term Goal 1 (Week 1): Pt will tolerate sitting in WC 2hour between PT PT Short Term Goal 2 (Week 1): Pt will sit EOB with mod assist x 5 mintues PT Short Term Goal 3 (Week 1): Pt will transfer with max assist of 1 to and from bed PT Short Term Goal 4 (Week 1): Pt will perform sit<>stand with max assist +2 for safety and LRAD  Skilled Therapeutic Interventions/Progress Updates:   Received pt supine in bed with wife and son present at bedside, interpreter not present during session, therefore used pt's son. No +2 available during session. Pt agreeable to therapy and denied any pain during session but reported numbness along LUE. Session with focus on bed mobility, dressing, dynamic sitting balance, upright tolerance and midline orientation, and generalized strengthening. Doffed dirty pants with total A of 2 provided by pt's wife and donned new shorts in same manner. Pt able to bridge to assist with dressing with total A to position LLE. Pt transferred supine<>sitting EOB with total A of 1. Worked on dynamic sitting balance for ~ 5 minutes with step underneath feet for support. Pt required max/total A for dynamic sitting balance and cues to maintain RUE on thigh as pt with tendency to push to L. Doffed gown and donned clean pull over shirt with +3 assist from pt's family while therapist provided total A for sitting balance. Pt unsteady in all directions with multiple LOB. Educated pt's family on dressing hemi arm first for ease. Pt reported feeling dizzy upon sitting EOB and returned to supine with max A of 1. BP: 136/79 and pt reported ease in symptoms in supine. Pt requested soft drink, consulted with RN and provided pt with cola. Palpated L quad to assess  activation; none noted. Assessed LUE grip strength and pt with trace amounts of finger flexion. Pt performed the following exercises supine in bed with increased time: -L knee flexion/extension AAROM x5 reps  -bridges 2x10 with total A to position LLE. (noted clicking around L GH joint) -L hip abd/add AAROM x 5 reps (pt with activation in add>abd) Scooted to [facility] with +2 assist x 2 trials. However pt able to push himself up in bed with assist to stabilize LLE. Positioned bed in chair position and worked on forward reaching x 5 with emphasis on trunk control and reaching outside BOS with RUE. Pt with L lateral LOB requiring total A to correct. Concluded session with pt sitting upright in bed, needs within reach, and bed alarm on. LUE supported on pillow for improved hemibody positioning.   Therapy Documentation Precautions:  Restrictions Weight Bearing Restrictions: No  Therapy/Group: Individual Therapy Martin Majestic PT, DPT   04/19/2020, 7:15 AM

## 2020-04-20 LAB — GLUCOSE, CAPILLARY
Glucose-Capillary: 99 mg/dL (ref 70–99)
Glucose-Capillary: 116 mg/dL — ABNORMAL HIGH (ref 70–99)
Glucose-Capillary: 113 mg/dL — ABNORMAL HIGH (ref 70–99)
Glucose-Capillary: 97 mg/dL (ref 70–99)

## 2020-04-20 MED ORDER — ATORVASTATIN CALCIUM 40 MG PO TABS
40.0000 mg | ORAL_TABLET | Freq: Every day | ORAL | Status: DC
  Administered 2020-04-21 – 2020-05-16 (×26): 40 mg via ORAL
  Filled 2020-04-20 (×26): qty 1

## 2020-04-20 MED ORDER — METFORMIN HCL 500 MG PO TABS
500.0000 mg | ORAL_TABLET | Freq: Every day | ORAL | Status: DC
  Administered 2020-04-21 – 2020-05-17 (×27): 500 mg via ORAL
  Filled 2020-04-20 (×27): qty 1

## 2020-04-20 MED ORDER — TRAMADOL HCL 50 MG PO TABS
50.0000 mg | ORAL_TABLET | Freq: Every day | ORAL | Status: DC | PRN
Start: 2020-04-20 — End: 2020-04-21

## 2020-04-20 NOTE — Progress Notes (Signed)
Physical Therapy Session Note  Patient Details  Name: Anthony Huber MRN: [id number] Date of Birth: 10-Apr-1960  Today's Date: 04/20/2020 PT Individual Time: 0802-0900 PT Individual Time Calculation (min): 58 min   Short Term Goals: Week 1:  PT Short Term Goal 1 (Week 1): Pt will tolerate sitting in WC 2hour between PT PT Short Term Goal 2 (Week 1): Pt will sit EOB with mod assist x 5 mintues PT Short Term Goal 3 (Week 1): Pt will transfer with max assist of 1 to and from bed PT Short Term Goal 4 (Week 1): Pt will perform sit<>stand with max assist +2 for safety and LRAD  Skilled Therapeutic Interventions/Progress Updates:  Patient supine in bed on entrance to room. Wife present. Attempts to call family member for assist with interpretation, but noise interference on phone prevents successful connection. Wife is able to accurately interpret with low level instructions provided to pt for required movements. Patient alert and agreeable to PT session. Patient denied pain during session. Only c/o n/t in L thigh.   Therapeutic Activity: Bed Mobility: Patient performed roll to each side of bed with vc and pt allowed time to initiate required movement. Completes with Mod A. Performs supine <> sit with Max A throughout session with significant lean to L side.  Provided verbal cues for midline orienting and Min A for R hand placement to thigh to decrease pushing. Mod A with multimodal cues for doffing and donning shirt.   Neuromuscular Re-ed: NMR facilitated during session with focus on sitting balance and midline orientation. Pt guided in AAROM modified sit-ups using elevated HOB and pillows behind back which are removed after every 5 reps for increasing range. Pt is able to perform with overall Min A for movement and intermittent Mod A for maintaining midline. Pt guided in LLE SLR with Max A, heel slides followed by extension with CGA, hip abd/ add with CGA/ Min A, and hip ER/ IR with Min A. With vc and time  for initiation, pt is able to perform all 2x10.   NMR performed for improvements in motor control and coordination, balance, sequencing, judgement, and self confidence/ efficacy in performing all aspects of mobility at highest level of independence.   Patient supine in bed at end of session with brakes locked, bed alarm set, and all needs within reach.  Therapy Documentation Precautions:  Restrictions Weight Bearing Restrictions: No  Therapy/Group: Individual Therapy  Loel Dubonnet PT, DPT 04/20/2020, 1:24 PM

## 2020-04-20 NOTE — Progress Notes (Signed)
[facility] PHYSICAL MEDICINE & REHABILITATION PROGRESS NOTE  Subjective/Complaints: Had emesis this morning after taking morning medications. Potential side effect of metformin: decrease back to daily and restart CBG checks No other complaints expressed to me by patient or wife Vital signs stable  ROS Denies CP or SOB, occ cough ( non observed), pain, +emesis  Objective: Vital Signs: Blood pressure 135/85, pulse 73, temperature 98.2 F (36.8 C), temperature source Oral, resp. rate 16, height 4\' 11"  (1.499 m), weight 48.9 kg, SpO2 96 %. No results found. No results for input(s): WBC, HGB, HCT, PLT in the last 72 hours. No results for input(s): NA, K, CL, CO2, GLUCOSE, BUN, CREATININE, CALCIUM in the last 72 hours.  Intake/Output Summary (Last 24 hours) at 04/20/2020 1558 Last data filed at 04/20/2020 0739 Gross per 24 hour  Intake 145 ml  Output --  Net 145 ml       Physical Exam: BP 135/85 (BP Location: Right Arm)   Pulse 73   Temp 98.2 F (36.8 C) (Oral)   Resp 16   Ht 4\' 11"  (1.499 m)   Wt 48.9 kg   SpO2 96%   BMI 21.77 kg/m  Gen: no distress, normal appearing HEENT: oral mucosa pink and moist, NCAT Cardio: Reg rate Chest: normal effort, normal rate of breathing Abd: soft, non-distended Ext: no edema Psych: alert , delayed responses  Musc: No edema in extremities.  No tenderness in extremities. Neuro: Alert Left facial weakness RUE/RLE: 4-/5 proximal distal LUE/LE: 0/5 proximal distal Sensation - pt says LT is equal on both hands but cannot identify which finger is touched on the Left hand  Assessment/Plan: 1. Functional deficits which require 3+ hours per day of interdisciplinary therapy in a comprehensive inpatient rehab setting.  Physiatrist is providing close team supervision and 24 hour management of active medical problems listed below.  Physiatrist and rehab team continue to assess barriers to discharge/monitor patient progress toward functional and  medical goals   Care Tool:  Bathing    Body parts bathed by patient: Face,Chest,Abdomen,Left arm   Body parts bathed by helper: Right arm,Left arm,Chest,Abdomen,Front perineal area,Buttocks,Right upper leg,Left upper leg,Right lower leg,Left lower leg,Face     Bathing assist Assist Level: Maximal Assistance - Patient 24 - 49%     Upper Body Dressing/Undressing Upper body dressing   What is the patient wearing?: Pull over shirt    Upper body assist Assist Level: Maximal Assistance - Patient 25 - 49%    Lower Body Dressing/Undressing Lower body dressing      What is the patient wearing?: Pants,Incontinence brief     Lower body assist Assist for lower body dressing: Maximal Assistance - Patient 25 - 49% (bed level)     Toileting Toileting    Toileting assist Assist for toileting: Total Assistance - Patient < 25%     Transfers Chair/bed transfer  Transfers assist     Chair/bed transfer assist level: 2 Helpers     Locomotion Ambulation   Ambulation assist   Ambulation activity did not occur: Safety/medical concerns          Walk 10 feet activity   Assist  Walk 10 feet activity did not occur: Safety/medical concerns        Walk 50 feet activity   Assist Walk 50 feet with 2 turns activity did not occur: Safety/medical concerns         Walk 150 feet activity   Assist Walk 150 feet activity did not occur: Safety/medical concerns  Walk 10 feet on uneven surface  activity   Assist Walk 10 feet on uneven surfaces activity did not occur: Safety/medical concerns         Wheelchair     Assist Will patient use wheelchair at discharge?: Yes Type of Wheelchair: Manual Wheelchair activity did not occur: Safety/medical concerns         Wheelchair 50 feet with 2 turns activity    Assist    Wheelchair 50 feet with 2 turns activity did not occur: Safety/medical concerns       Wheelchair 150 feet activity      Assist  Wheelchair 150 feet activity did not occur: Safety/medical concerns        Medical Problem List and Plan: 1.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 Impaired mobility -DVT/anticoagulation:Continue Subcutaneous continue heparin initiated for 02/25/2020 -antiplatelet therapy: N/A 3. Pain Management:continue Tylenol as needed. Decrease tramadol to daily prn.  4. Mood:Provide emotional support -antipsychotic agents: N/A 5. Neuropsych: This patientis notcapable of making decisions on hisown behalf. 6. Skin/Wound Care:Routine skin checks 7. Fluids/Electrolytes/Nutrition:Routine in and outs. 8. Hypertension. BP well controlled- continue Norvasc 10 mg daily, clonidine 0.1 mg twice daily, Lopressor 50 mg twice daily.   D/c clonidine.   Well controlled, continue current regimen.  9. History of open Uretlithotomy2005 in [place] as well as left PCNL and laser ureterotomyOctober 2018 followed by Dr. Marlou Porch. 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  -CONTINUE FOLEY TUBE.DO NOT REMOVE .Foley catheter to remain in place until follow-up in the office of Dr. Modena Slater for cystogram. 10. Diet controlled diabetes mellitus. Hemoglobin A1c 5.8. SSI -Educate pt/family as needed CBG (last 3)  Recent Labs    04/19/20 2051 04/20/20 0628 04/20/20 1132  GLUCAP 108* 99 116*  given emesis, decrease metformin back to 500mg  daily and restart CBG checks.  11.  Leukocytosis  WBCs 11.6 on 4/8   Afebrile  Continue to monitor 12.  Acute blood loss anemia  Hemoglobin 12.0 on 4/8  Continue to monitor 13.  CKD   Creatinine 1.21 on 4/8  Continue to monitor 14.  Gout - in right knee  Improved- d/c colchicine 15.  Slow transit constipation  Bowel  meds increased on 4/8 16. Lethargy: increase ritalin to 5mg  BID 17. Emesis after morning medications: change Lipitor to HS to spread out medications  LOS: 9 days A FACE TO FACE EVALUATION WAS PERFORMED  6/8 Rainn Zupko 04/20/2020, 3:58 PM

## 2020-04-20 NOTE — Progress Notes (Signed)
Large amount of emesis this morning right after medication administration. No fever noted, total bed change completed. Patient states he is ok. Wife confirms. Will monitor.

## 2020-04-20 NOTE — Progress Notes (Signed)
Speech Language Pathology Daily Session Note  Patient Details  Name: Anthony Huber MRN: [id number] Date of Birth: 1960/08/05  Today's Date: 04/20/2020 SLP Individual Time: 6967-8938 SLP Individual Time Calculation (min): 43 min  Short Term Goals: Week 2: SLP Short Term Goal 1 (Week 2): Patient will tolerate regular texture solids without significant c/o fatigue from mastication with supervisionA SLP Short Term Goal 2 (Week 2): Patient will perform basic level functional problem solving tasks with modA cues. SLP Short Term Goal 3 (Week 2): Patient will initiate during conversation and functional tasks with minA cues to perform consistently SLP Short Term Goal 4 (Week 2): Patient will demonstrate recall of daily events (therapeutic and nursing) with minA cues. SLP Short Term Goal 5 (Week 2): Patient will demonstrate adequate selective attention during tasks performance with modA cues.  Skilled Therapeutic Interventions: Pt seen for skilled ST with focus on swallowing and communication goals. Pt wife present, interpreter utilized via phone. Pt continues with eye gaze to left requiring cues to attempt to shift gaze to midline. Pt demonstrates left neglect during picture ID/description task, min cues effective in tracking to see entirety of photo. Pt describing simple photos with 60% accuracy provided extra time and repetition of prompt from interpretor. When asked, pt states it is due to difficulty with vision and dizziness vs word finding difficulty. Pt, wife and interpretor (son) educated on ongoing use of recommended swallow strategies and precautions. Pt seen with RN present for med pass, consuming pills with mild delayed A-P transit, requiring repeated liquid assistance to increase transit and complete swallow. SLP attempting to facilitate regular texture trial to determine tolerance and oral phase impairments, however pt with large amount emesis after medication administration. RN was still in room, pt  reports feeling okay, wife confirms. Pt left in RN care, cont ST POC.   Pain Pain Assessment Pain Scale: 0-10 Pain Score: 0-No pain  Therapy/Group: Individual Therapy  Tacey Ruiz 04/20/2020, 9:09 AM

## 2020-04-21 LAB — GLUCOSE, CAPILLARY
Glucose-Capillary: 108 mg/dL — ABNORMAL HIGH (ref 70–99)
Glucose-Capillary: 142 mg/dL — ABNORMAL HIGH (ref 70–99)
Glucose-Capillary: 126 mg/dL — ABNORMAL HIGH (ref 70–99)
Glucose-Capillary: 137 mg/dL — ABNORMAL HIGH (ref 70–99)

## 2020-04-21 NOTE — Progress Notes (Signed)
[facility] PHYSICAL MEDICINE & REHABILITATION PROGRESS NOTE  Subjective/Complaints: No complaints Patient's chart reviewed- No issues reported overnight Vitals signs stable No more emesis  ROS Denies CP or SOB, occ cough ( non observed), pain, emesis  Objective: Vital Signs: Blood pressure 127/75, pulse 77, temperature 98.4 F (36.9 C), temperature source Oral, resp. rate 16, height 4\' 11"  (1.499 m), weight 48.9 kg, SpO2 95 %. No results found. No results for input(s): WBC, HGB, HCT, PLT in the last 72 hours. No results for input(s): NA, K, CL, CO2, GLUCOSE, BUN, CREATININE, CALCIUM in the last 72 hours.  Intake/Output Summary (Last 24 hours) at 04/21/2020 1333 Last data filed at 04/21/2020 1308 Gross per 24 hour  Intake 480 ml  Output 400 ml  Net 80 ml       Physical Exam: BP 127/75 (BP Location: Right Arm)   Pulse 77   Temp 98.4 F (36.9 C) (Oral)   Resp 16   Ht 4\' 11"  (1.499 m)   Wt 48.9 kg   SpO2 95%   BMI 21.77 kg/m  Gen: no distress, normal appearing HEENT: oral mucosa pink and moist, NCAT Cardio: Reg rate Chest: normal effort, normal rate of breathing Abd: soft, non-distended Ext: no edema Psych: alert , delayed responses  Musc: No edema in extremities.  No tenderness in extremities. Neuro: Alert Left facial weakness RUE/RLE: 4-/5 proximal distal LUE/LE: 0/5 proximal distal Sensation - pt says LT is equal on both hands but cannot identify which finger is touched on the Left hand  Assessment/Plan: 1. Functional deficits which require 3+ hours per day of interdisciplinary therapy in a comprehensive inpatient rehab setting.  Physiatrist is providing close team supervision and 24 hour management of active medical problems listed below.  Physiatrist and rehab team continue to assess barriers to discharge/monitor patient progress toward functional and medical goals   Care Tool:  Bathing    Body parts bathed by patient: Face,Chest,Abdomen,Left arm    Body parts bathed by helper: Right arm,Left arm,Chest,Abdomen,Front perineal area,Buttocks,Right upper leg,Left upper leg,Right lower leg,Left lower leg,Face     Bathing assist Assist Level: Maximal Assistance - Patient 24 - 49%     Upper Body Dressing/Undressing Upper body dressing   What is the patient wearing?: Pull over shirt    Upper body assist Assist Level: Maximal Assistance - Patient 25 - 49%    Lower Body Dressing/Undressing Lower body dressing      What is the patient wearing?: Pants,Incontinence brief     Lower body assist Assist for lower body dressing: Maximal Assistance - Patient 25 - 49% (bed level)     Toileting Toileting    Toileting assist Assist for toileting: Total Assistance - Patient < 25%     Transfers Chair/bed transfer  Transfers assist     Chair/bed transfer assist level: 2 Helpers     Locomotion Ambulation   Ambulation assist   Ambulation activity did not occur: Safety/medical concerns          Walk 10 feet activity   Assist  Walk 10 feet activity did not occur: Safety/medical concerns        Walk 50 feet activity   Assist Walk 50 feet with 2 turns activity did not occur: Safety/medical concerns         Walk 150 feet activity   Assist Walk 150 feet activity did not occur: Safety/medical concerns         Walk 10 feet on uneven surface  activity  Assist Walk 10 feet on uneven surfaces activity did not occur: Safety/medical concerns         Wheelchair     Assist Will patient use wheelchair at discharge?: Yes Type of Wheelchair: Manual Wheelchair activity did not occur: Safety/medical concerns         Wheelchair 50 feet with 2 turns activity    Assist    Wheelchair 50 feet with 2 turns activity did not occur: Safety/medical concerns       Wheelchair 150 feet activity     Assist  Wheelchair 150 feet activity did not occur: Safety/medical concerns        Medical Problem  List and Plan: 1.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 Impaired mobility -DVT/anticoagulation:Continue Subcutaneous continue heparin initiated for 02/25/2020 -antiplatelet therapy: N/A 3. Pain Management:continue Tylenol as needed. D/c tramadol.  4. Mood:Provide emotional support -antipsychotic agents: N/A 5. Neuropsych: This patientis notcapable of making decisions on hisown behalf. 6. Skin/Wound Care:Routine skin checks 7. Fluids/Electrolytes/Nutrition:Routine in and outs. 8. Hypertension. BP well controlled- continue Norvasc 10 mg daily, clonidine 0.1 mg twice daily, Lopressor 50 mg twice daily.   D/c clonidine.   Well controlled, continue current regimen.  9. History of open Uretlithotomy2005 in [place] as well as left PCNL and laser ureterotomyOctober 2018 followed by Dr. Marlou Porch. 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  -CONTINUE FOLEY TUBE.DO NOT REMOVE .Foley catheter to remain in place until follow-up in the office of Dr. Modena Slater for cystogram. 10. Diet controlled diabetes mellitus. Hemoglobin A1c 5.8. SSI -Educate pt/family as needed CBG (last 3)  Recent Labs    04/20/20 2054 04/21/20 0622 04/21/20 1139  GLUCAP 97 108* 142*   4/16: given emesis, decrease metformin back to 500mg  daily and restart CBG checks.   4/17: CBGs 97-142: consider increasing metformin to 500mg  BID on 4/23 (decreased in case was contributing to emesis).  11.  Leukocytosis  WBCs 11.6 on 4/8   Afebrile  Continue to monitor 12.  Acute blood loss anemia  Hemoglobin 12.0 on 4/8  Continue to monitor 13.  CKD   Creatinine 1.21 on 4/8  Continue to monitor 14.  Gout - in right knee  Improved- d/c colchicine 15.  Slow transit constipation  Bowel meds  increased on 4/8 16. Lethargy: increase ritalin to 5mg  BID 17. Emesis after morning medications: change Lipitor to HS to spread out medications  4/17: no more episodes, continue to monitor  LOS: 10 days A FACE TO FACE EVALUATION WAS PERFORMED  6/8 P Ellice Boultinghouse 04/21/2020, 1:33 PM

## 2020-04-22 LAB — GLUCOSE, CAPILLARY
Glucose-Capillary: 100 mg/dL — ABNORMAL HIGH (ref 70–99)
Glucose-Capillary: 117 mg/dL — ABNORMAL HIGH (ref 70–99)
Glucose-Capillary: 113 mg/dL — ABNORMAL HIGH (ref 70–99)
Glucose-Capillary: 126 mg/dL — ABNORMAL HIGH (ref 70–99)

## 2020-04-22 MED ORDER — GLUCERNA SHAKE PO LIQD
237.0000 mL | Freq: Three times a day (TID) | ORAL | Status: DC
  Administered 2020-04-22 – 2020-05-16 (×61): 237 mL via ORAL
  Filled 2020-04-22: qty 237

## 2020-04-22 MED ORDER — ADULT MULTIVITAMIN W/MINERALS CH
1.0000 | ORAL_TABLET | Freq: Every day | ORAL | Status: DC
  Administered 2020-04-22 – 2020-05-17 (×26): 1 via ORAL
  Filled 2020-04-22 (×26): qty 1

## 2020-04-22 NOTE — Progress Notes (Signed)
Physical Therapy Session Note  Patient Details  Name: Anthony Huber MRN: [id number] Date of Birth: Nov 11, 1960  Today's Date: 04/22/2020 PT Individual Time: 0800-0900 + 1300-1355  PT Individual Time Calculation (min): 60 min  + 55 min  Short Term Goals: Week 2:  PT Short Term Goal 1 (Week 2): Pt will complete supine<>sit with modA and use of hospital bed features PT Short Term Goal 2 (Week 2): Pt will sit unsupported with modA x10 minutes PT Short Term Goal 3 (Week 2): Pt will perform bed<>chair transfer with maxA of 1 person  Skilled Therapeutic Interventions/Progress Updates:     1st session: Pt greeted supine in bed sleeping, wife at bedside who awakens pt, pt agreeable to therapy. Appears to have no c/o pain. No interpreter at bedside but wife able to provide basic assist with interpretation via gestures. Donned athletic shorts with totalA. With both BLE in bridging position, pt able to clear hips to assist in pulling shorts over hips. Rolls in bed with modA and needs maxA for sidelying<>sitting. Provided 6inch platform under BLE for support and pt needing maxA for sitting balance. Donned shirt with totalA while seated EOB. Squat<>pivot transfer with maxA with +2 assist for w/c stabilization. Pt needing totalA for repositioning in w/c. W/c transport to main rehab gym for time management.  Squat<>pivot transfer with maxA +2 from TIS w/c to mat table. Pt needing maxA of 1 person to maintain unsupported sitting while edge of mat. Pt continues to demo L lateral lean and very delayed righting reactions with any LOB. Completed targeted reaching (cones) with RUE across midline, forward, and ipsilateral. Needing cues for attending and visual gaze to target. Placed large wedge behind his back and instructed pt on horseshoe toss to target (target placed on R side and then L side to promote cervical rotation and visual scanning).   Remainder of session focused on LLE NMR with E-stim for large muscle groups  to L quadriceps. Without e-stim, pt able to achieve 2+/5 L knee extension and unable to maintain any knee extension isometric. With e-stim applied, pt able to achieve 3-/5 L knee extension and maintain LAQ in extension for a few brief seconds. Pt appears to deny any pain or discomfort with this and skin was c/d/i after completion.   Squat<>pivot transfer with maxA back to his TIS w/c and returned to room. Pt remained reclined in TIS w/c with safety belt alarm on and needs within reach.   2nd session: Pt greeted supine in bed with wife and in-house interpreter at bedside. Pt agreeable to therpay session but reports fatigue as he just got back to bed from sitting in chair all morning. No reports of pain. Focused 1st half of session on LLE NMR re ed with e-stim as pt had a good response from AM session. Applied e-stim to quadriceps muscle group including VMO. With wedge under knee, instructed on completing LAQ with 2 second ramp up on e-stim. Pt able to achieve full (!) knee extension during this activity and completed 20 repetitions with 2-3 second holds.   Supine<>sit with mod/maxA of 1 person with bed features. RequiresmaxA for sitting EOB and performed squat<>pivot transfer with maxA to TIS w/c. 6inch platform used to assist with feet positioning. W/c transport to main rehab gym for time management. Completed additional squat<>pivot transfer with modA (!) towards stronger R side with similar technique as above. With +2 assist, completed a total of 5 sit<>stands with L knee block and +2 assist for trunk control -  needing tech to "drape" arm across chest and needing therapist to facilitate trunk and hip extension. Pt able to maintain standing for ~30 second bouts each time. During seated "rest breaks" cues needed for patient to maintain unsupported sitting balance which ranged from minA to maxA.   Squat<>pivot transfer back to TIS w/c with totalA (due to fatigue, likely) and returned to room. Squat<>pivot  back to bed with totalA and pt needing +2 maxA for sit>supine for BLE and trunk management. Supine scoot towards HOB with totalA for pt repositioning. Remained supine in bed with bed alarm on and family at bedside. Needs within reach.   Therapy Documentation Precautions:  Restrictions Weight Bearing Restrictions: No  Therapy/Group: Individual Therapy  Antolin Belsito P Meridee Branum PT 04/22/2020, 7:34 AM

## 2020-04-22 NOTE — Progress Notes (Signed)
[place] PHYSICAL MEDICINE & REHABILITATION PROGRESS NOTE  Subjective/Complaints: Pt without issues. Feeling well this morning.   ROS: limited due to language/communication    Objective: Vital Signs: Blood pressure 128/77, pulse 74, temperature 98.9 F (37.2 C), temperature source Oral, resp. rate 14, height 4\' 11"  (1.499 m), weight 48.9 kg, SpO2 95 %. No results found. No results for input(s): WBC, HGB, HCT, PLT in the last 72 hours. No results for input(s): NA, K, CL, CO2, GLUCOSE, BUN, CREATININE, CALCIUM in the last 72 hours.  Intake/Output Summary (Last 24 hours) at 04/22/2020 1102 Last data filed at 04/22/2020 0500 Gross per 24 hour  Intake 240 ml  Output 1000 ml  Net -760 ml       Physical Exam: BP 128/77 (BP Location: Right Arm)   Pulse 74   Temp 98.9 F (37.2 C) (Oral)   Resp 14   Ht 4\' 11"  (1.499 m)   Wt 48.9 kg   SpO2 95%   BMI 21.77 kg/m  Constitutional: No distress . Vital signs reviewed. HEENT: EOMI, oral membranes moist Neck: supple Cardiovascular: RRR without murmur. No JVD    Respiratory/Chest: CTA Bilaterally without wheezes or rales. Normal effort    GI/Abdomen: BS +, non-tender, non-distended Ext: no clubbing, cyanosis, or edema Psych: alert, delayed Musc: No edema in extremities.  No tenderness in extremities. Neuro: Alert Left central VII RUE/RLE: 4-/5 proximal distal LUE/LE: 0/5 proximal distal Sensation - decreased LT discrimination Left side     Assessment/Plan: 1. Functional deficits which require 3+ hours per day of interdisciplinary therapy in a comprehensive inpatient rehab setting.  Physiatrist is providing close team supervision and 24 hour management of active medical problems listed below.  Physiatrist and rehab team continue to assess barriers to discharge/monitor patient progress toward functional and medical goals   Care Tool:  Bathing    Body parts bathed by patient: Face,Chest,Abdomen,Left arm   Body parts  bathed by helper: Right arm,Left arm,Chest,Abdomen,Front perineal area,Buttocks,Right upper leg,Left upper leg,Right lower leg,Left lower leg,Face     Bathing assist Assist Level: Maximal Assistance - Patient 24 - 49%     Upper Body Dressing/Undressing Upper body dressing   What is the patient wearing?: Pull over shirt    Upper body assist Assist Level: Maximal Assistance - Patient 25 - 49%    Lower Body Dressing/Undressing Lower body dressing      What is the patient wearing?: Pants,Incontinence brief     Lower body assist Assist for lower body dressing: Maximal Assistance - Patient 25 - 49% (bed level)     Toileting Toileting    Toileting assist Assist for toileting: Total Assistance - Patient < 25%     Transfers Chair/bed transfer  Transfers assist     Chair/bed transfer assist level: 2 Helpers     Locomotion Ambulation   Ambulation assist   Ambulation activity did not occur: Safety/medical concerns          Walk 10 feet activity   Assist  Walk 10 feet activity did not occur: Safety/medical concerns        Walk 50 feet activity   Assist Walk 50 feet with 2 turns activity did not occur: Safety/medical concerns         Walk 150 feet activity   Assist Walk 150 feet activity did not occur: Safety/medical concerns         Walk 10 feet on uneven surface  activity   Assist Walk 10 feet on uneven surfaces activity  did not occur: Safety/medical concerns         Wheelchair     Assist Will patient use wheelchair at discharge?: Yes Type of Wheelchair: Manual Wheelchair activity did not occur: Safety/medical concerns         Wheelchair 50 feet with 2 turns activity    Assist    Wheelchair 50 feet with 2 turns activity did not occur: Safety/medical concerns       Wheelchair 150 feet activity     Assist  Wheelchair 150 feet activity did not occur: Safety/medical concerns      BP 128/77 (BP Location: Right  Arm)   Pulse 74   Temp 98.9 F (37.2 C) (Oral)   Resp 14   Ht 4\' 11"  (1.499 m)   Wt 48.9 kg   SpO2 95%   BMI 21.77 kg/m    Medical Problem List and Plan: 1.Left hemiparesissecondary to right basal ganglia intraparenchymal hemorrhage secondary to hypertensive crisis  -Continue CIR therapies including PT, OT, and SLP   2. Impaired mobility -DVT/anticoagulation:Continue Subcutaneous continue heparin initiated for 02/25/2020 -antiplatelet therapy: N/A 3. Pain Management:continue Tylenol as needed. D/c tramadol.  4. Mood:Provide emotional support -antipsychotic agents: N/A 5. Neuropsych: This patientis notcapable of making decisions on hisown behalf. 6. Skin/Wound Care:Routine skin checks 7. Fluids/Electrolytes/Nutrition:Routine in and outs. 8. Hypertension. BP well controlled- continue Norvasc 10 mg daily, clonidine 0.1 mg twice daily, Lopressor 50 mg twice daily.   D/c'ed clonidine.   BP Well controlled, continue current regimen.  9. History of open Uretlithotomy2005 in 02/27/2020 as well as left PCNL and laser ureterotomyOctober 2018 followed by Dr. November 2018. 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  -CONTINUE FOLEY TUBE.DO NOT REMOVE .Foley catheter to remain in place until follow-up in the office of Dr. Marlou Porch for cystogram. 10. Diet controlled diabetes mellitus. Hemoglobin A1c 5.8. SSI -Educate pt/family as needed CBG (last 3)  Recent Labs    04/21/20 1633 04/21/20 2107 04/22/20 0641  GLUCAP 126* 137* 100*   4/16: given emesis, decrease metformin back to 500mg  daily and restart CBG checks.   4/18 CBG's under reasonable control with lower dose metformin--no changes  11.  Leukocytosis  WBCs 11.6 on 4/8   Afebrile  Recheck 4/19 12.  Acute blood loss anemia  Hemoglobin 12.0 on 4/8  Recheck 4/19 13.  CKD   Creatinine 1.21  on 4/8  Re-check labs 4/19 14.  Gout - in right knee  Improved- now off colchicine 15.  Slow transit constipation  Bowel meds increased on 4/8 16. Lethargy: increased ritalin to 5mg  BID 17. Emesis after morning medications: change Lipitor to HS to spread out medications  4/17-18: no more episodes, continue to monitor  LOS: 11 days A FACE TO FACE EVALUATION WAS PERFORMED  6/8 04/22/2020, 11:02 AM

## 2020-04-22 NOTE — Progress Notes (Signed)
Occupational Therapy Session Note  Patient Details  Name: Anthony Huber MRN: [id number] Date of Birth: 1960-08-15  Today's Date: 04/22/2020 OT Individual Time: 1005-1100 OT Individual Time Calculation (min): 55 min    Short Term Goals: Week 2:  OT Short Term Goal 1 (Week 2): Pt will be able to sit EOB with mod A in prep for self care. OT Short Term Goal 2 (Week 2): Pt will demonstrate improved L side awareness with bathing UB with min a. OT Short Term Goal 3 (Week 2): Pt will demonstrate improved apraxia to don shirt with mod a. OT Short Term Goal 4 (Week 2): Pt will transfer bed>< wc with mod A of 2.  Skilled Therapeutic Interventions/Progress Updates:    Pt received in w/c with wife and interpretor present but no +2 A available for therapy.  Pt taken to gym and placed in fully upright position with foam pad behind his back for more upright support.  With L arm rest removed for increased balance challenge, pt worked on LUE and trunk facilitation. Used various methods for hand over hand guidance progressing to a/arom using skate board, towel slides on table, cone grasping, etc. Today pt demonstrated increased activation of horizontal abduction, biceps, triceps and grasp.  Pt able to squeeze and hold onto cones briefly for a stacking activity.  Worked on protective responses using R truncal muscles for when pt falls to his L. Had pt hold L arm in R and when he would fall to his L, pt cued to engage R trunk to "pull" himself over to midline.  Great carryover of this today.   Pt continues to have difficulty visually focusing on targets but there was some improvement today.   [name] was very helpful as usual!   He has good family support.   Pt taken back to room and tilted back in TIS chair to rest. Belt alarm on and all needs met.   Therapy Documentation Precautions:  Restrictions Weight Bearing Restrictions: No    Vital Signs: Therapy Vitals Temp: 98.9 F (37.2 C) Temp Source:  Oral Pulse Rate: 74 Resp: 14 BP: 128/77 Patient Position (if appropriate): Lying Oxygen Therapy SpO2: 95 % O2 Device: Room Air Pain: Pain Assessment Pain Scale: 0-10 Pain Score: 0-No pain ADL: ADL Grooming: Moderate assistance Upper Body Bathing: Maximal assistance Where Assessed-Upper Body Bathing: Bed level Lower Body Bathing: Dependent Where Assessed-Lower Body Bathing: Bed level Upper Body Dressing: Maximal assistance Where Assessed-Upper Body Dressing: Chair Lower Body Dressing: Dependent Where Assessed-Lower Body Dressing: Bed level Toileting: Dependent Where Assessed-Toileting: Bed level Toilet Transfer: Unable to assess   Therapy/Group: Individual Therapy  [place] 04/22/2020, 8:30 AM

## 2020-04-22 NOTE — Consult Note (Signed)
Neuropsychological Consultation   Patient:   Anthony Huber   DOB:   [birth year]  MR Number:  [id number]  Location:  [facility] [address] CENTER A [address] [id number] [place] [address] Dept: [phone] Loc: [phone]           Date of Service:   04/22/2020  Start Time:   9 AM End Time:   10 AM  Provider/Observer:  Arley Phenix, Psy.D.       Clinical Neuropsychologist       Billing Code/Service: [phone]  Chief Complaint:    Anthony Huber is a 60 year old right-handed male with a history of urological difficulties, diabetes mellitus and hypertension with remote tobacco use.  Patient presented on 04/06/2020 with altered mental status, headache and left-sided weakness.  Cranial CT scan showed a 4.3 x 2.4 x 3.4 cm acute hemorrhage s centered at the right basal ganglia.  Localized mass-effect with up to 5 mm right to left shift noted.  MRI/MRA showed stable size and morphology of right basal ganglia hemorrhage with similar surrounding vasogenic edema and regional mass-effect.  MRA was unremarkable.  Long-term urological as well as renal changes were also addressed.  Reason for Service:  Patient was referred for neuropsychological consultation due to coping and adjustment with significant left side motor deficits with left arm greater than left leg.  Below is the HPI for the current admission.  OTL:XBWI Anthony Huber a 60 year old right-handed male with history of open ureterlithotomy2005 in [place] as well as left PCNL and laser ureterotomyOctober 2018 per Dr. Marlou Porch, CAD, diabetes mellitus and hypertension with remote tobacco use. Per chart review lives with spouse independent prior to admission. 1 level home. He works at a [employer], [employer]. Presented 04/06/2020 with altered mental status headache and left-sided weakness. Blood pressure 170/100. Cranial CT scan showed a 4.3 x 2.4 x 3.4 cm acute intraparenchymal hemorrhage centered at  the right basal ganglia. Localized mass-effect with up to 5 mm right to left shift. Admission chemistries unremarkable except hemoglobin A1c 5.8 WBC 15,700. MRI/MRA showed stable size and morphology of right basal ganglia intraparenchymal hemorrhage with similar surrounding vasogenic edema and regional mass-effect. Associated 5 mm right to left shift. No intraventricular extension hydrocephalus or ventricular trapping. MRA was unremarkable. Echocardiogram with ejection fraction of 65 to 70% no wall motion abnormalities. Maintained on Cleviprex for blood pressure control. Patient was cleared to begin subcutaneous heparin for DVT prophylaxis 04/06/2020. Hospital course follow-up urology services 04/08/2020 for evaluation of leaking urine from his incision line which had been intermittent since ureterotomy. CT abdomen pelvis showed several tiny stones in the distal left ureter. Moderate left hydroureter and severe left hydronephrosis. Severe left renal parenchymal atrophy and cortical thinning secondary to chronic hydronephrosis. Linear band to the proximal left ureter with focal luminal narrowing likely representing adhesion or scarring with 2 punctate nonobstructing left renal inferior pole calculi and fluid cultures were sent and plan Foley catheter tube insertion. 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 Therapy evaluations completed due to patient's weakness and decreased functional mobility was admitted for a comprehensive rehab program.  Current Status:  The interpreter was present when I initially got there and the patient and his wife had significant questions around what it actually occurred in details around what to expect going forward regarding his cerebrovascular accident.  The patient reports  that he was beginning to get some movement in his left leg but could only  move his fingers a very slight amount with no significant change over the past couple of days.  The patient denied any significant or severe depression or anxiety that was inhibiting his ability to participate in therapeutic interventions.  The patient only speaks [place] with no English understanding.  The patient was oriented but was nave medically.  The patient's wife had a number of questions regarding what to expect going forward and I reviewed the issue of his team conference that will occur on Wednesday with greater information about length of stay.  Behavioral Observation: Anthony Huber  presents as a 60 y.o.-year-old Right Asian Male who appeared his stated age. his dress was Appropriate and he was Well Groomed and his manners were Appropriate to the situation.  his participation was indicative of Appropriate and Attentive behaviors.  There were physical disabilities noted.  he displayed an appropriate level of cooperation and motivation.     Interactions:    Active Appropriate and Attentive  Attention:   within normal limits and attention span and concentration were age appropriate  Memory:   within normal limits; recent and remote memory intact  Visuo-spatial:  not examined  Speech (Volume):  normal  Speech:   normal; normal  Thought Process:  Coherent and Relevant  Though Content:  WNL; not suicidal and not homicidal  Orientation:   person, place and time/date  Judgment:   Fair  Planning:   Fair  Affect:    Appropriate  Mood:    Dysphoric  Insight:   Fair  Intelligence:   normal  Medical History:   Past Medical History:  Diagnosis Date  . Coronary artery disease   . Diabetes mellitus without complication (HCC)   . Hypertension          Patient Active Problem List   Diagnosis Date Noted  . Slow transit constipation   . Chronic gout of right knee   . Acute blood loss anemia   . Leukocytosis   . ICH (intracerebral hemorrhage) (HCC) 04/06/2020  . H/O  nephrolithotomy with removal of calculi 09/16/2016  . Nephrolithiasis 09/05/2016    Psychiatric History:  No Prior psychiatric history  Family Med/Psych History: History reviewed. No pertinent family history.   Impression/DX:  Anthony Huber is a 60 year old right-handed male with a history of urological difficulties, diabetes mellitus and hypertension with remote tobacco use.  Patient presented on 04/06/2020 with altered mental status, headache and left-sided weakness.  Cranial CT scan showed a 4.3 x 2.4 x 3.4 cm acute hemorrhage s centered at the right basal ganglia.  Localized mass-effect with up to 5 mm right to left shift noted.  MRI/MRA showed stable size and morphology of right basal ganglia hemorrhage with similar surrounding vasogenic edema and regional mass-effect.  MRA was unremarkable.  Long-term urological as well as renal changes were also addressed.  The interpreter was present when I initially got there and the patient and his wife had significant questions around what it actually occurred in details around what to expect going forward regarding his cerebrovascular accident.  The patient reports that he was beginning to get some movement in his left leg but could only move his fingers a very slight amount with no significant change over the past couple of days.  The patient denied any significant or severe depression or anxiety that was inhibiting his ability to participate in therapeutic interventions.  The patient only  speaks [place] with no English understanding.  The patient was oriented but was nave medically.  The patient's wife had a number of questions regarding what to expect going forward and I reviewed the issue of his team conference that will occur on Wednesday with greater information about length of stay.  Disposition/Plan:  Today we worked on coping and adjustment following significant loss of left-sided motor function following right basal ganglia hemorrhage.  Worked  on understanding patient's medical status with the patient and his wife and answered questions they had regarding expectations going forward.  Diagnosis:    Nontraumatic subcortical hemorrhage of left cerebral hemisphere [facility]) - Plan: Ambulatory referral to Neurology         Electronically Signed   _______________________ Arley Phenix, Psy.D. Clinical Neuropsychologist

## 2020-04-22 NOTE — Progress Notes (Signed)
Initial Nutrition Assessment  DOCUMENTATION CODES:   Not applicable  INTERVENTION:   -Glucerna Shake po TID, each supplement provides 220 kcal and 10 grams of protein -MVI with minerals daily -Provided "Carbohydrate Counting for People with Diabetes" handout from [facility] Nutrition Care Manual  NUTRITION DIAGNOSIS:   Increased nutrient needs related to chronic illness (CAD, DM) as evidenced by estimated needs.  GOAL:   Patient will meet greater than or equal to 90% of their needs  MONITOR:   PO intake,Supplement acceptance,Labs,Weight trends,Skin,I & O's  REASON FOR ASSESSMENT:   Consult Assessment of nutrition requirement/status  ASSESSMENT:   Anthony Huber is a 60 year old right-handed male with history of open ureterlithotomy 2005 in [place] as well as left PCNL and laser ureterotomy October 2018 per Dr. Marlou Porch, CAD, diabetes mellitus and hypertension with remote tobacco use.  Pt admitted with lt hemiparesis secondary to rt basal ganglia intraparenchymal hemorrhage secondary to hypertensive crisis.   Reviewed I/O's: -760 ml x 24 hours and -6.3 L since admission  UOP: 1 L x 24 hours  Spoke with pt, wife, and family member over the phone. Pt's does not speak [place] and his native language in [place], which is not available through interpreter services.   Pt has a good appetite, per family. PTA, pt consumes 3 meals per day, which consisted of salad, chicken/pork/fish, and rice. Noted meal completions have been variable; PO 20-80%.   Reviewed wt hx; suspect wt from 04/09/20 is an outlier. Per pt and family, pt has not experienced any weight loss.   Pt very rigid during exam; he reports that the ritalin makes him stiff and not want to move.   Pt and family deny any further questions or needs from RD at this time.   Medications reviewed and include miralax and senokot.  Lab Results  Component Value Date   HGBA1C 5.8 (H) 04/06/2020   PTA DM medications are none.    Labs reviewed: CBGS: 100-142 (inpatient orders for glycemic control are 500 mg metformin BID).   NUTRITION - FOCUSED PHYSICAL EXAM:  Flowsheet Row Most Recent Value  Orbital Region No depletion  Upper Arm Region No depletion  Thoracic and Lumbar Region No depletion  Buccal Region No depletion  Temple Region No depletion  Clavicle Bone Region No depletion  Clavicle and Acromion Bone Region No depletion  Scapular Bone Region No depletion  Dorsal Hand No depletion  Patellar Region No depletion  Anterior Thigh Region No depletion  Posterior Calf Region No depletion  Edema (RD Assessment) None  Hair Reviewed  Eyes Reviewed  Mouth Reviewed  Skin Reviewed  Nails Reviewed       Diet Order:   Diet Order            Diet Carb Modified Fluid consistency: Thin; Room service appropriate? Yes with Assist  Diet effective now                 EDUCATION NEEDS:   Education needs have been addressed  Skin:  Skin Assessment: Reviewed RN Assessment  Last BM:  04/20/20  Height:   Ht Readings from Last 1 Encounters:  04/11/20 4\' 11"  (1.499 m)    Weight:   Wt Readings from Last 1 Encounters:  04/11/20 48.9 kg    Ideal Body Weight:  44.5 kg  BMI:  Body mass index is 21.77 kg/m.  Estimated Nutritional Needs:   Kcal:  1450-1650  Protein:  65-80 grams  Fluid:  > 1.4 L    Ellakate Gonsalves  W, RD, LDN, [place] Registered Dietitian II Certified Diabetes Care and Education Specialist Please refer to [facility] for RD and/or RD on-call/weekend/after hours pager

## 2020-04-22 NOTE — Discharge Instructions (Addendum)
Inpatient Rehab Discharge Instructions  Anthony Huber Discharge date and time: No discharge date for patient encounter.   Activities/Precautions/ Functional Status: Activity: activity as tolerated Diet: Diabetic diet Wound Care: Routine skin checks Functional status:  ___ No restrictions     ___ Walk up steps independently ___ 24/7 supervision/assistance   ___ Walk up steps with assistance ___ Intermittent supervision/assistance  ___ Bathe/dress independently ___ Walk with walker     _x__ Bathe/dress with assistance ___ Walk Independently    ___ Shower independently ___ Walk with assistance    ___ Shower with assistance ___ No alcohol     ___ Return to work/school ________  Special Instructions: No driving smoking or alcohol  Follow-up with urology services Dr. 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 and plan of care 05/20/2020 at 11 AM     COMMUNITY REFERRALS UPON DISCHARGE:    Home Health:   PT, OT, RN                 Agency:[facility] [phone]    Medical Equipment/Items Ordered:[facility] [facility] [phone]                                                                3 IN 1 AND TUB [facility] [phone]                                                      STROKE/TIA DISCHARGE INSTRUCTIONS SMOKING Cigarette smoking nearly doubles your risk of having a stroke & is the single most alterable risk factor  If you smoke or have smoked in the last 12 months, you are advised to quit smoking for your health.  Most of the excess cardiovascular risk related to smoking disappears within a year of stopping.  Ask you doctor about anti-smoking medications  [place] Quit Line: 1-800-QUIT NOW  Free Smoking Cessation Classes [phone]  CHOLESTEROL Know your levels; limit fat & cholesterol in your diet  Lipid Panel     Component Value Date/Time   CHOL 173 04/07/2020 1644   TRIG 130 04/11/2020 0418   HDL 36 (L) 04/07/2020 1644    CHOLHDL 4.8 04/07/2020 1644   VLDL 48 (H) 04/07/2020 1644   LDLCALC 89 04/07/2020 1644      Many patients benefit from treatment even if their cholesterol is at goal.  Goal: Total Cholesterol (CHOL) less than 160  Goal:  Triglycerides (TRIG) less than 150  Goal:  HDL greater than 40  Goal:  LDL (LDLCALC) less than 100   BLOOD PRESSURE American Stroke Association blood pressure target is less that 120/80 mm/Hg  Your discharge blood pressure is:  BP: 114/67  Monitor your blood pressure  Limit your salt and alcohol intake  Many individuals will require more than one medication for high blood pressure  DIABETES (A1c is a blood sugar average for last 3 months) Goal HGBA1c is under 7% (HBGA1c is blood sugar average for last 3 months)  Diabetes:   Lab Results  Component Value Date   HGBA1C 5.8 (H) 04/06/2020     Your HGBA1c can be lowered with medications,  healthy diet, and exercise.  Check your blood sugar as directed by your physician  Call your physician if you experience unexplained or low blood sugars.  PHYSICAL ACTIVITY/REHABILITATION Goal is 30 minutes at least 4 days per week  Activity: Increase activity slowly, Therapies: Physical Therapy: Home Health Return to work:   Activity decreases your risk of heart attack and stroke and makes your heart stronger.  It helps control your weight and blood pressure; helps you relax and can improve your mood.  Participate in a regular exercise program.  Talk with your doctor about the best form of exercise for you (dancing, walking, swimming, cycling).  DIET/WEIGHT Goal is to maintain a healthy weight  Your discharge diet is:  Diet Order    None      liquids Your height is:    Your current weight is:   Your Body Mass Index (BMI) is:     Following the type of diet specifically designed for you will help prevent another stroke.  Your goal weight range is:    Your goal Body Mass Index (BMI) is 19-24.  Healthy food habits  can help reduce 3 risk factors for stroke:  High cholesterol, hypertension, and excess weight.  RESOURCES Stroke/Support Group:  Call [phone]   STROKE EDUCATION PROVIDED/REVIEWED AND GIVEN TO PATIENT Stroke warning signs and symptoms How to activate emergency medical system (call 911). Medications prescribed at discharge. Need for follow-up after discharge. Personal risk factors for stroke. Pneumonia vaccine given:  Flu vaccine given:  My questions have been answered, the writing is legible, and I understand these instructions.  I will adhere to these goals & educational materials that have been provided to me after my discharge from the hospital.      My questions have been answered and I understand these instructions. I will adhere to these goals and the provided educational materials after my discharge from the hospital.  Patient/Caregiver Signature _______________________________ Date __________  Clinician Signature _______________________________________ Date __________  Carbohydrate Counting For People With Diabetes  Foods with carbohydrates make your blood glucose level go up. Learning how to count carbohydrates can help you control your blood glucose levels. First, identify the foods you eat that contain carbohydrates. Then, using the Foods with Carbohydrates chart, determine about how much carbohydrates are in your meals and snacks. Make sure you are eating foods with fiber, protein, and healthy fat along with your carbohydrate foods. Foods with Carbohydrates The following table shows carbohydrate foods that have about 15 grams of carbohydrate each. Using measuring cups, spoons, or a food scale when you first begin learning about carbohydrate counting can help you learn about the portion sizes you typically eat. The following foods have 15 grams carbohydrate each:  Grains . 1 slice bread (1 ounce)  . 1 small tortilla (6-inch size)  .  large bagel (1 ounce)  . 1/3 cup  pasta or rice (cooked)  .  hamburger or hot dog bun ( ounce)  .  cup cooked cereal  .  to  cup ready-to-eat cereal  . 2 taco shells (5-inch size) Fruit . 1 small fresh fruit ( to 1 cup)  .  medium banana  . 17 small grapes (3 ounces)  . 1 cup melon or berries  .  cup canned or frozen fruit  . 2 tablespoons dried fruit (blueberries, cherries, cranberries, raisins)  .  cup unsweetened fruit juice  Starchy Vegetables .  cup cooked beans, peas, corn, potatoes/sweet potatoes  .  large baked  potato (3 ounces)  . 1 cup acorn or butternut squash  Snack Foods . 3 to 6 crackers  . 8 potato chips or 13 tortilla chips ( ounce to 1 ounce)  . 3 cups popped popcorn  Dairy . 3/4 cup (6 ounces) nonfat plain yogurt, or yogurt with sugar-free sweetener  . 1 cup milk  . 1 cup plain rice, soy, coconut or flavored almond milk Sweets and Desserts .  cup ice cream or frozen yogurt  . 1 tablespoon jam, jelly, pancake syrup, table sugar, or honey  . 2 tablespoons light pancake syrup  . 1 inch square of frosted cake or 2 inch square of unfrosted cake  . 2 small cookies (2/3 ounce each) or  large cookie  Sometimes you'll have to estimate carbohydrate amounts if you don't know the exact recipe. One cup of mixed foods like soups can have 1 to 2 carbohydrate servings, while some casseroles might have 2 or more servings of carbohydrate. Foods that have less than 20 calories in each serving can be counted as "free" foods. Count 1 cup raw vegetables, or  cup cooked non-starchy vegetables as "free" foods. If you eat 3 or more servings at one meal, then count them as 1 carbohydrate serving.  Foods without Carbohydrates  Not all foods contain carbohydrates. Meat, some dairy, fats, non-starchy vegetables, and many beverages don't contain carbohydrate. So when you count carbohydrates, you can generally exclude chicken, pork, beef, fish, seafood, eggs, tofu, cheese, butter, sour cream, avocado, nuts, seeds,  olives, mayonnaise, water, black coffee, unsweetened tea, and zero-calorie drinks. Vegetables with no or low carbohydrate include green beans, cauliflower, tomatoes, and onions. How much carbohydrate should I eat at each meal?  Carbohydrate counting can help you plan your meals and manage your weight. Following are some starting points for carbohydrate intake at each meal. Work with your registered dietitian nutritionist to find the best range that works for your blood glucose and weight.   To Lose Weight To Maintain Weight  Women 2 - 3 carb servings 3 - 4 carb servings  Men 3 - 4 carb servings 4 - 5 carb servings  Checking your blood glucose after meals will help you know if you need to adjust the timing, type, or number of carbohydrate servings in your meal plan. Achieve and keep a healthy body weight by balancing your food intake and physical activity.  Tips How should I plan my meals?  Plan for half the food on your plate to include non-starchy vegetables, like salad greens, broccoli, or carrots. Try to eat 3 to 5 servings of non-starchy vegetables every day. Have a protein food at each meal. Protein foods include chicken, fish, meat, eggs, or beans (note that beans contain carbohydrate). These two food groups (non-starchy vegetables and proteins) are low in carbohydrate. If you fill up your plate with these foods, you will eat less carbohydrate but still fill up your stomach. Try to limit your carbohydrate portion to  of the plate.  What fats are healthiest to eat?  Diabetes increases risk for heart disease. To help protect your heart, eat more healthy fats, such as olive oil, nuts, and avocado. Eat less saturated fats like butter, cream, and high-fat meats, like bacon and sausage. Avoid trans fats, which are in all foods that list "partially hydrogenated oil" as an ingredient. What should I drink?  Choose drinks that are not sweetened with sugar. The healthiest choices are water, carbonated or  seltzer waters, and tea and  coffee without added sugars.  Sweet drinks will make your blood glucose go up very quickly. One serving of soda or energy drink is  cup. It is best to drink these beverages only if your blood glucose is low.  Artificially sweetened, or diet drinks, typically do not increase your blood glucose if they have zero calories in them. Read labels of beverages, as some diet drinks do have carbohydrate and will raise your blood glucose. Label Reading Tips Read Nutrition Facts labels to find out how many grams of carbohydrate are in a food you want to eat. Don't forget: sometimes serving sizes on the label aren't the same as how much food you are going to eat, so you may need to calculate how much carbohydrate is in the food you are serving yourself.   Carbohydrate Counting for People with Diabetes Sample 1-Day Menu  Breakfast  cup yogurt, low fat, low sugar (1 carbohydrate serving)   cup cereal, ready-to-eat, unsweetened (1 carbohydrate serving)  1 cup strawberries (1 carbohydrate serving)   cup almonds ( carbohydrate serving)  Lunch 1, 5 ounce can chunk light tuna  2 ounces cheese, low fat cheddar  6 whole wheat crackers (1 carbohydrate serving)  1 small apple (1 carbohydrate servings)   cup carrots ( carbohydrate serving)   cup snap peas  1 cup 1% milk (1 carbohydrate serving)   Evening Meal Stir fry made with: 3 ounces chicken  1 cup brown rice (3 carbohydrate servings)   cup broccoli ( carbohydrate serving)   cup green beans   cup onions  1 tablespoon olive oil  2 tablespoons teriyaki sauce ( carbohydrate serving)  Evening Snack 1 extra small banana (1 carbohydrate serving)  1 tablespoon peanut butter   Carbohydrate Counting for People with Diabetes Vegan Sample 1-Day Menu  Breakfast 1 cup cooked oatmeal (2 carbohydrate servings)   cup blueberries (1 carbohydrate serving)  2 tablespoons flaxseeds  1 cup soymilk fortified with calcium and vitamin D   1 cup coffee  Lunch 2 slices whole wheat bread (2 carbohydrate servings)   cup baked tofu   cup lettuce  2 slices tomato  2 slices avocado   cup baby carrots ( carbohydrate serving)  1 orange (1 carbohydrate serving)  1 cup soymilk fortified with calcium and vitamin D   Evening Meal Burrito made with: 1 6-inch corn tortilla (1 carbohydrate serving)  1 cup refried vegetarian beans (2 carbohydrate servings)   cup chopped tomatoes   cup lettuce   cup salsa  1/3 cup brown rice (1 carbohydrate serving)  1 tablespoon olive oil for rice   cup zucchini   Evening Snack 6 small whole grain crackers (1 carbohydrate serving)  2 apricots ( carbohydrate serving)   cup unsalted peanuts ( carbohydrate serving)    Carbohydrate Counting for People with Diabetes Vegetarian (Lacto-Ovo) Sample 1-Day Menu  Breakfast 1 cup cooked oatmeal (2 carbohydrate servings)   cup blueberries (1 carbohydrate serving)  2 tablespoons flaxseeds  1 egg  1 cup 1% milk (1 carbohydrate serving)  1 cup coffee  Lunch 2 slices whole wheat bread (2 carbohydrate servings)  2 ounces low-fat cheese   cup lettuce  2 slices tomato  2 slices avocado   cup baby carrots ( carbohydrate serving)  1 orange (1 carbohydrate serving)  1 cup unsweetened tea  Evening Meal Burrito made with: 1 6-inch corn tortilla (1 carbohydrate serving)   cup refried vegetarian beans (1 carbohydrate serving)   cup tomatoes   cup  lettuce   cup salsa  1/3 cup brown rice (1 carbohydrate serving)  1 tablespoon olive oil for rice   cup zucchini  1 cup 1% milk (1 carbohydrate serving)  Evening Snack 6 small whole grain crackers (1 carbohydrate serving)  2 apricots ( carbohydrate serving)   cup unsalted peanuts ( carbohydrate serving)    Copyright 2020  Academy of Nutrition and Dietetics. All rights reserved.  Using Nutrition Labels: Carbohydrate  . Serving Size  . Look at the serving size. All the information on the  label is based on this portion. Anthony Huber. Servings Per Container  . The number of servings contained in the package. . Guidelines for Carbohydrate  . Look at the total grams of carbohydrate in the serving size.  . 1 carbohydrate choice = 15 grams of carbohydrate. Range of Carbohydrate Grams Per Choice  Carbohydrate Grams/Choice Carbohydrate Choices  6-10   11-20 1  21-25 1  26-35 2  36-40 2  41-50 3  51-55 3  56-65 4  66-70 4  71-80 5    Copyright 2020  Academy of Nutrition and Dietetics. All rights reserved.   Please bring this form and your medication list with you to all your follow-up doctor's appointments.

## 2020-04-23 DIAGNOSIS — E1165 Type 2 diabetes mellitus with hyperglycemia: Secondary | ICD-10-CM

## 2020-04-23 DIAGNOSIS — I10 Essential (primary) hypertension: Secondary | ICD-10-CM

## 2020-04-23 LAB — GLUCOSE, CAPILLARY
Glucose-Capillary: 110 mg/dL — ABNORMAL HIGH (ref 70–99)
Glucose-Capillary: 120 mg/dL — ABNORMAL HIGH (ref 70–99)
Glucose-Capillary: 196 mg/dL — ABNORMAL HIGH (ref 70–99)
Glucose-Capillary: 92 mg/dL (ref 70–99)

## 2020-04-23 LAB — CBC
HCT: 45 % (ref 39.0–52.0)
Hemoglobin: 14.4 g/dL (ref 13.0–17.0)
MCH: 22 pg — ABNORMAL LOW (ref 26.0–34.0)
MCHC: 32 g/dL (ref 30.0–36.0)
MCV: 68.6 fL — ABNORMAL LOW (ref 80.0–100.0)
Platelets: 393 10*3/uL (ref 150–400)
RBC: 6.56 MIL/uL — ABNORMAL HIGH (ref 4.22–5.81)
RDW: 16.9 % — ABNORMAL HIGH (ref 11.5–15.5)
WBC: 11.2 10*3/uL — ABNORMAL HIGH (ref 4.0–10.5)
nRBC: 0 % (ref 0.0–0.2)

## 2020-04-23 NOTE — Progress Notes (Signed)
Physical Therapy Session Note  Patient Details  Name: Anthony Huber MRN: [id number] Date of Birth: 1960/05/25  Today's Date: 04/23/2020 PT Individual Time: 1045-1100 + 1300-1353 PT Individual Time Calculation (min): 15 min + 53 min  Short Term Goals: Week 2:  PT Short Term Goal 1 (Week 2): Pt will complete supine<>sit with modA and use of hospital bed features PT Short Term Goal 2 (Week 2): Pt will sit unsupported with modA x10 minutes PT Short Term Goal 3 (Week 2): Pt will perform bed<>chair transfer with maxA of 1 person  Skilled Therapeutic Interventions/Progress Updates:   1st session:   Pt greeted sitting in TIS w/c, agreeable to unscheduled therapy. Pt denies pain. Focus of session to perform NMR with e-stim to L quadriceps. Applied pads to proximal quad and distal VMO. Instructed patient on completing LAQ with 2 second ramp up and 5 second on time. Pt able to initiate LAQ but needing assist to reach full extension (3-/5) with "jerky" motion noted and absent eccentric control. Pt needing totalA for repositioning in TIS w/c as it was noted he was beginning to forward slide. Pt ended session reclined in TIS w/c with LUE support with pillow and pillow propping trunk in upright position. Safety belt alarm on and needs within reach.  2nd session: Pt received sitting in TIS w/c, awake and agreeable to therapy. In person interpreter and wife at bedside. Pt reports fatigue from sitting up in chair and requesting to get back to bed at end of therapy session. Pt reports no pain. W/c transport for time management to main rehab gym. Squat<>pivot transfer with maxA using 6inch platform under BLE's as pt is short stature. With back supported from large wedge, pt able to sit with CGA/minA but still requires maxA for LOB to the L. Placed card matching in front of him and pt participated in card matching with RUE and encouraged forward weight shift to reach in front of him. Pt has not played cards before so a  novel task as far as card matching. He also struggled with identifying correct card match - benefit from hand-over-hand assist to facilitate and needing modA for sitting balance while doing this. Pt then instructed on sit<>stands from raised mat table while standing on 6inch platform. Pt required +2 maxA to achieve full upright. Tech supporting shoulders with therapist on stool stabilizing L knee and faciliating hip/truncal extension. Mirror in front for visual feedback. Pt able to stand for ~30 second bouts. Pt reporting dizziness and fatigue - requesting to return to his room. Squat<>pivot transfer with maxA +2 back to his TIS w/c and wheeled back to his room. Additional squat<>pivot transfer with maxA +2 to EOB and pt completed sit>supine with maxA. Pt remained supine in bed with LUE supported with pillow, bed alarm on, and needs within reach. RN made aware at end of session regarding dizzy spells in therapy.   Therapy Documentation Precautions:  Restrictions Weight Bearing Restrictions: No General:    Therapy/Group: Individual Therapy  Anthony Huber P Anthony Huber PT 04/23/2020, 7:44 AM

## 2020-04-23 NOTE — Progress Notes (Signed)
[place] PHYSICAL MEDICINE & REHABILITATION PROGRESS NOTE  Subjective/Complaints: Patient seen laying in bed this morning.  No reported issues overnight.  Wife at bedside, who indicates patient slept well overnight.  ROS: limited due to language/communication.   Objective: Vital Signs: Blood pressure (!) 142/83, pulse 72, temperature 97.8 F (36.6 C), resp. rate 18, height 4\' 11"  (1.499 m), weight 48.9 kg, SpO2 96 %. No results found. Recent Labs    04/23/20 0535  WBC 11.2*  HGB 14.4  HCT 45.0  PLT 393   No results for input(s): NA, K, CL, CO2, GLUCOSE, BUN, CREATININE, CALCIUM in the last 72 hours.  Intake/Output Summary (Last 24 hours) at 04/23/2020 1147 Last data filed at 04/22/2020 1311 Gross per 24 hour  Intake 240 ml  Output --  Net 240 ml       Physical Exam: BP (!) 142/83 (BP Location: Right Arm)   Pulse 72   Temp 97.8 F (36.6 C)   Resp 18   Ht 4\' 11"  (1.499 m)   Wt 48.9 kg   SpO2 96%   BMI 21.77 kg/m  Constitutional: No distress . Vital signs reviewed. HENT: Normocephalic.  Atraumatic. Eyes: EOMI. No discharge. Cardiovascular: No JVD.   Respiratory: Normal effort.  No stridor.   GI: Non-distended.   Skin: Warm and dry.  Intact. Psych: Normal mood.  Normal behavior. Musc: No edema in extremities.  No tenderness in extremities. Neuro: Alert Left facial weakness Left gaze preference LUE/LE: 0/5 proximal distal No increase in tone noted  Assessment/Plan: 1. Functional deficits which require 3+ hours per day of interdisciplinary therapy in a comprehensive inpatient rehab setting.  Physiatrist is providing close team supervision and 24 hour management of active medical problems listed below.  Physiatrist and rehab team continue to assess barriers to discharge/monitor patient progress toward functional and medical goals   Care Tool:  Bathing    Body parts bathed by patient: Face,Chest,Abdomen,Left arm   Body parts bathed by helper: Right  arm,Left arm,Chest,Abdomen,Front perineal area,Buttocks,Right upper leg,Left upper leg,Right lower leg,Left lower leg,Face     Bathing assist Assist Level: Maximal Assistance - Patient 24 - 49%     Upper Body Dressing/Undressing Upper body dressing   What is the patient wearing?: Pull over shirt    Upper body assist Assist Level: Maximal Assistance - Patient 25 - 49%    Lower Body Dressing/Undressing Lower body dressing      What is the patient wearing?: Pants,Incontinence brief     Lower body assist Assist for lower body dressing: Maximal Assistance - Patient 25 - 49% (bed level)     Toileting Toileting    Toileting assist Assist for toileting: Total Assistance - Patient < 25%     Transfers Chair/bed transfer  Transfers assist     Chair/bed transfer assist level: 2 Helpers     Locomotion Ambulation   Ambulation assist   Ambulation activity did not occur: Safety/medical concerns          Walk 10 feet activity   Assist  Walk 10 feet activity did not occur: Safety/medical concerns        Walk 50 feet activity   Assist Walk 50 feet with 2 turns activity did not occur: Safety/medical concerns         Walk 150 feet activity   Assist Walk 150 feet activity did not occur: Safety/medical concerns         Walk 10 feet on uneven surface  activity   Assist  Walk 10 feet on uneven surfaces activity did not occur: Safety/medical concerns         Wheelchair     Assist Will patient use wheelchair at discharge?: Yes Type of Wheelchair: Manual Wheelchair activity did not occur: Safety/medical concerns         Wheelchair 50 feet with 2 turns activity    Assist    Wheelchair 50 feet with 2 turns activity did not occur: Safety/medical concerns       Wheelchair 150 feet activity     Assist  Wheelchair 150 feet activity did not occur: Safety/medical concerns       Medical Problem List and Plan: 1.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 Impaired mobility -DVT/anticoagulation:Continue Subcutaneous continue heparin initiated for 02/25/2020 -antiplatelet therapy: N/A 3. Pain Management:continue Tylenol as needed. D/c tramadol.  4. Mood:Provide emotional support -antipsychotic agents: N/A 5. Neuropsych: This patientis notcapable of making decisions on hisown behalf. 6. Skin/Wound Care:Routine skin checks 7. Fluids/Electrolytes/Nutrition:Routine in and outs. 8. Hypertension.   Norvasc 10 mg daily  Lopressor 50 mg twice daily.   D/c'ed clonidine.   Relatively controlled on 4/19 9. History of open Uretlithotomy2005 in [place] as well as left PCNL and laser ureterotomyOctober 2018 followed by Dr. Marlou Porch. 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  -CONTINUE FOLEY TUBE.DO NOT REMOVE .Foley catheter to remain in place until follow-up in the office of Dr. Modena Slater for cystogram. 10. Diet controlled diabetes mellitus. Hemoglobin A1c 5.8. SSI -Educate pt/family as needed CBG (last 3)  Recent Labs    04/22/20 2116 04/23/20 0605 04/23/20 1134  GLUCAP 126* 110* 120*   Metformin decreased back to 500mg  daily and restart CBG checks.   Mildly elevated on 4/19 11.  Leukocytosis  WBCs 11.2 on 4/19   Afebrile  Recheck 4/19 12.  Acute blood loss anemia  Hemoglobin 14.4 on 4/19  Recheck 4/19 13.  CKD   Creatinine 1.21 on 4/8, labs ordered for tomorrow  Re-check labs 4/19 14.  Gout - in right knee  Improved- now off colchicine 15.  Slow transit constipation  Bowel meds increased on 4/8 16. Lethargy: increased ritalin to 5mg  BID 17. Emesis   Appears to have resolved   Changed Lipitor to HS to spread out medications   LOS: 12 days A FACE TO FACE EVALUATION  WAS PERFORMED  Anthony Huber 6/8 04/23/2020, 11:47 AM

## 2020-04-23 NOTE — Progress Notes (Signed)
Speech Language Pathology Daily Session Note  Patient Details  Name: Chanse Kagel MRN: [id number] Date of Birth: 22-Sep-1960  Today's Date: 04/23/2020 SLP Individual Time: 5329-9242 SLP Individual Time Calculation (min): 45 min  Short Term Goals: Week 2: SLP Short Term Goal 1 (Week 2): Patient will tolerate regular texture solids without significant c/o fatigue from mastication with supervisionA SLP Short Term Goal 2 (Week 2): Patient will perform basic level functional problem solving tasks with modA cues. SLP Short Term Goal 3 (Week 2): Patient will initiate during conversation and functional tasks with minA cues to perform consistently SLP Short Term Goal 4 (Week 2): Patient will demonstrate recall of daily events (therapeutic and nursing) with minA cues. SLP Short Term Goal 5 (Week 2): Patient will demonstrate adequate selective attention during tasks performance with modA cues.  Skilled Therapeutic Interventions:Skilled ST services focused on cognitive skills. Interpretor was not present, utilized Archivist in [place] language barrier impacted treatment session. SLP facilitated left scanning utilizing portions of the [place] R hemisphere assessment. Pt was able to locate 4 out 4 items in meal time picture with mod A multimodal cues to scan left of midline, Pt was able to locate items on a clock picture in 5 out 5 opportunities with mod A verbal cues to scan left, pt was able to locate colored shapes in 5 out 5 opportunities with min A verbal cues to scan left and locate food items on page in 5 out 5 opportunities with mod A multimodal cues to scan left. Pt was left in room with wife, call bell within reach and bed alarm set. SLP recommends to continue skilled services.     Pain Pain Assessment Pain Score: 0-No pain  Therapy/Group: Individual Therapy  Jaramiah Bossard  [facility] 04/23/2020, 11:37 AM

## 2020-04-23 NOTE — Progress Notes (Signed)
Occupational Therapy Session Note  Patient Details  Name: Anthony Huber MRN: [id number] Date of Birth: [birth year]  Today's Date: 04/23/2020 OT Individual Time: 0900-1000 OT Individual Time Calculation (min): 60 min    Short Term Goals: Week 2:  OT Short Term Goal 1 (Week 2): Pt will be able to sit EOB with mod A in prep for self care. OT Short Term Goal 2 (Week 2): Pt will demonstrate improved L side awareness with bathing UB with min a. OT Short Term Goal 3 (Week 2): Pt will demonstrate improved apraxia to don shirt with mod a. OT Short Term Goal 4 (Week 2): Pt will transfer bed>< wc with mod A of 2.  Skilled Therapeutic Interventions/Progress Updates:    Pt seen this session to facilitate trunk and LUE/ LLE motor control. Interpretor and wife present.  Pt received in bed and worked on bridges with increased motor control and LLE a/arom with sliding foot back and forth on the slide board. Pt sat to EOB with mod A and cues to push with his RUE.  Pt needed mod A initially and then progressed to CGA with sitting EOB.  +2 slide board to wc.  Pt taken to gym and worked on sit to stand in // bars Pt had great initiation with rising to stand using his R side, stood with max faciliation to stabilize L knee. Stood up for 4 trials of 20- 30 sec each working on trunk elongation.  Pt then positioned at hi low table with L arm on the table to work on a/arom with table slides of proximal movement and then hand over hand of grasping cones.  Frequent cues to visually attend to hand.   Pt returned to room with belt alarm on. All needs met.    Therapy Documentation Precautions:  Restrictions Weight Bearing Restrictions: No  Pain: Pain Assessment Pain Score: 0-No pain    Therapy/Group: Individual Therapy  Fowler 04/23/2020, 10:34 AM

## 2020-04-24 DIAGNOSIS — N179 Acute kidney failure, unspecified: Secondary | ICD-10-CM

## 2020-04-24 DIAGNOSIS — I10 Essential (primary) hypertension: Secondary | ICD-10-CM

## 2020-04-24 LAB — GLUCOSE, CAPILLARY
Glucose-Capillary: 108 mg/dL — ABNORMAL HIGH (ref 70–99)
Glucose-Capillary: 114 mg/dL — ABNORMAL HIGH (ref 70–99)
Glucose-Capillary: 135 mg/dL — ABNORMAL HIGH (ref 70–99)
Glucose-Capillary: 127 mg/dL — ABNORMAL HIGH (ref 70–99)

## 2020-04-24 LAB — BASIC METABOLIC PANEL
Anion gap: 11 (ref 5–15)
BUN: 35 mg/dL — ABNORMAL HIGH (ref 8–23)
CO2: 23 mmol/L (ref 22–32)
Calcium: 9.5 mg/dL (ref 8.9–10.3)
Chloride: 102 mmol/L (ref 98–111)
Creatinine, Ser: 1.32 mg/dL — ABNORMAL HIGH (ref 0.61–1.24)
GFR, Estimated: >60 mL/min (ref >60)
Glucose, Bld: 106 mg/dL — ABNORMAL HIGH (ref 70–99)
Potassium: 4.7 mmol/L (ref 3.5–5.1)
Sodium: 136 mmol/L (ref 135–145)

## 2020-04-24 NOTE — Patient Care Conference (Signed)
Inpatient RehabilitationTeam Conference and Plan of Care Update Date: 04/24/2020   Time: 11:29 AM    Patient Name: Anthony Huber      Medical Record Number: [id number]  Date of Birth: 1960/02/08 Sex: Male         Room/Bed: 4W20C/4W20C-01 Payor Info: Payor: BLUE CROSS BLUE SHIELD / Plan: BCBS COMM PPO / Product Type: *No Product type* /    Admit Date/Time:  04/11/2020  2:09 PM  Primary Diagnosis:  ICH (intracerebral hemorrhage) [facility])  Hospital Problems: Principal Problem:   ICH (intracerebral hemorrhage) (HCC) Active Problems:   Slow transit constipation   Chronic gout of right knee   Acute blood loss anemia   Leukocytosis   Controlled type 2 diabetes mellitus with hyperglycemia, without long-term current use of insulin (HCC)   Essential hypertension   AKI (acute kidney injury) (HCC)   Benign essential HTN    Expected Discharge Date: Expected Discharge Date: 05/17/20  Team Members Present: Physician leading conference: Dr. Maryla Morrow Care Coodinator Present: Dossie Der, LCSW;Rastus Borton Lambert Mody, RN, BSN, CRRN Nurse Present: Chana Bode, RN PT Present: Wynelle Link, PT OT Present: Primitivo Gauze, OT SLP Present: Other (comment) Fae Pippin, SLP) PPS Coordinator present : Fae Pippin, SLP     Current Status/Progress Goal Weekly Team Focus  Bowel/Bladder   Foley cath, Continent bowel. LBM 04/22/2020  Remain continent bowel, maintain foley- no s/s of infection  Assess Foley/Bowels Qshift and PRN   Swallow/Nutrition/ Hydration   Regular textures and thin liquids, supervision A  Supervision A  tolerance of diet   ADL's   making good progress - static sit with min A, transfers with mod of 2, UB dressing mod A, LB max, developing Lue active movement (1/5)  min - mod A overall  balance, postural control, LUE NMR, L side and midline awareness, pt/fam ed   Mobility   modA for rolling in bed. MaxA for supine<>sit. MaxA for squat<>pivot transfers. Sitting balance fluctuates b/w  minA to maxA. Poor to delayed protective and righting responses. Improving L hemibody strengthening. Working on functional Sit<>stand transfers  modA overall. Will likely be w/c level  Sitting balance, e-stim for LLE strengthening, L hemibody NMR, functional transfers.   Communication   Mod A  Min A  expression wants/needs at sentence level,   Safety/Cognition/ Behavioral Observations  Mod A  MIn A  selective attention, basic problem solving, initation and awareness   Pain   No c/o pain, scheduled voltaren applied  less than 3 out of 10  Qshift and PRN   Skin   Skin intact  remain free of breakdown  Assess Qshift and PRN     Discharge Planning:  Wife here daily and attending therapies with pt, see's progress. Have encouraged son to come in and observe Dad in therapies, so can see his deficits and progress making   Team Discussion: Foley to remain at discharge until urology follow up. CKD - encourage po fluids. Slow steady progress. Patient tolerating a regular thin liquid diet well.  Patient on target to meet rehab goals: yes, currently max assist for ADLs and mod assist for bed mobility/rolling. Min assist to sit up and max assist for mid line adjustment and squat pivot transfers. Able to sit - stand weight bearing on the left side. Currently mod assist for cognition and communication with goals of min assist for basic tasks.  *See Care Plan and progress notes for long and short-term goals.   Revisions to Treatment Plan:  Working on core  sitting balance, midline awareness Estim for left quad Scanning left of midline  Teaching Needs: Transfers, toileting, foley/peri care, medications, etc.   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 secondary to hypertensive crisis  Barriers to Discharge: Medical stability   Possible Resolutions to [employer] Focus: Therapies, optimize stimulants, follow labs - Cr, CBGs, optimize BP   Continued Need for Acute Rehabilitation Level of Care: The patient requires daily medical management by a physician with specialized training in physical medicine and rehabilitation for the following reasons: Direction of a multidisciplinary physical rehabilitation program to maximize functional independence : Yes Medical management of patient stability for increased activity during participation in an intensive rehabilitation regime.: Yes Analysis of laboratory values and/or radiology reports with any subsequent need for medication adjustment and/or medical intervention. : Yes   I attest that I was present, lead the team conference, and concur with the assessment and plan of the team.   Chana Bode B 04/24/2020, 1:19 PM

## 2020-04-24 NOTE — Progress Notes (Signed)
Occupational Therapy Session Note  Patient Details  Name: Anthony Huber MRN: [id number] Date of Birth: 04-29-1960  Today's Date: 04/24/2020 OT Individual Time: 0900-1000 OT Individual Time Calculation (min): 60 min    Short Term Goals: Week 2:  OT Short Term Goal 1 (Week 2): Pt will be able to sit EOB with mod A in prep for self care. OT Short Term Goal 2 (Week 2): Pt will demonstrate improved L side awareness with bathing UB with min a. OT Short Term Goal 3 (Week 2): Pt will demonstrate improved apraxia to don shirt with mod a. OT Short Term Goal 4 (Week 2): Pt will transfer bed>< wc with mod A of 2.  Skilled Therapeutic Interventions/Progress Updates:    Pt received from PT at end of PT session. Pt sitting on mat and with +2 A from another PT, worked on sit to stand in the [place] plus without the foot plate and attempted ambulation but unable to have pt use his L leg as pt very distracted and began "sinking"  Into the sling.   Instead worked on sit to stand and standing statically from edge of mat with +2 max A. Pt powered up and then needed A for midline control in stand and tactile cues to engage L leg.  Max cues (verbal and tactile) for upright posture.   Sat on mat and worked on sq pivot to wc with mod A of 2.  From wc, a/arom for LUE with table slides with pt demonstrating improved activation of biceps.  Pt needed to have a BM.  Pt taken back to room, wife requesting for pt to sit on BSC.  Explained that it was not safe at this point but tomorrow we could try a "dry run" practice.  His LPN assisted me with a +2 mod A slide board to the bed.  Pt then worked on bridging with mod cues to have his pants removed and to place bed pan.  Pt resting in bed with pan and wife will press call light when he is ready to be cleaned up.   Therapy Documentation Precautions:  Restrictions Weight Bearing Restrictions: No   Pain: Pain Assessment Pain Scale: 0-10 Pain Score: 0-No pain ADL: ADL Grooming:  Moderate assistance Upper Body Bathing: Maximal assistance Where Assessed-Upper Body Bathing: Bed level Lower Body Bathing: Dependent Where Assessed-Lower Body Bathing: Bed level Upper Body Dressing: Maximal assistance Where Assessed-Upper Body Dressing: Chair Lower Body Dressing: Dependent Where Assessed-Lower Body Dressing: Bed level Toileting: Dependent Where Assessed-Toileting: Bed level Toilet Transfer: Unable to assess   Therapy/Group: Individual Therapy  Latron Ribas 04/24/2020, 10:38 AM

## 2020-04-24 NOTE — Progress Notes (Signed)
Physical Therapy Session Note  Patient Details  Name: Anthony Huber MRN: [id number] Date of Birth: [birth year]  Today's Date: 04/24/2020 PT Individual Time: 0800-0900 PT Individual Time Calculation (min): 60 min   Short Term Goals: Week 2:  PT Short Term Goal 1 (Week 2): Pt will complete supine<>sit with modA and use of hospital bed features PT Short Term Goal 2 (Week 2): Pt will sit unsupported with modA x10 minutes PT Short Term Goal 3 (Week 2): Pt will perform bed<>chair transfer with maxA of 1 person  Skilled Therapeutic Interventions/Progress Updates:    Pt greeted supine in bed sleeping, wife at bedside, pt agreeable to PT session. In person interpreter not at bedside on arrival but arriving 1/2 way through session in rehab gym. No reports of pain. Pt fully dressed on arrival. Supine<>sit with modA with use of bed features. Pt initially requires maxA for sitting balance but progresses to minA with cues. 6inch platform under BLE for support. Squat<>Pivot transfer with maxA from EOB to TIS w/c - needing 2 scoots to achieve transfer. W/c transport for time management to main rehab gym. Squat<>pivot transfer with similar technique to mat table, requiring MODA (!) with improved initiation and technique. Pt able to sit unsupported at mat table with CGA/minA with less frequent LOB to the L.   Ace wrapped LUE to unweighted dowel rod. While seated EOB, pt completed bilateral shldr flexion and chest press with unweighted dowel rod but needing AAROM for LUE. Good sitting balance noted while completing dynamic movement, only needing minA.   Demonstrated purposes and uses of [place] Plus to patient. With +2 assist (for safety), pt able to initiate sit<>stand in [place] with therapist facilitating trunk/hip extension and +2 assist for adjusting Sara Plus height. Pt able to maintain standing in [place] Plus with min/modA for standing balance with therapist arm draped across chest to promote extension. He demo's L  trunk shortening in standing. In standing, he was able to initiate functional reaching with RUE across midline and ipsilateral reaching to targets (cones). Performed several repetitions of this.  Attempted to assess his vision but language barrier and attention limiting. Appears to have L eye peripheral deficits with questionable field cut. Pt slow to track but able.   Handoff of care to OT in rehab gym.  Therapy Documentation Precautions:  Restrictions Weight Bearing Restrictions: No General:    Therapy/Group: Individual Therapy  Evangelyne Loja P Natoria Archibald PT 04/24/2020, 7:37 AM

## 2020-04-24 NOTE — Progress Notes (Signed)
[place] PHYSICAL MEDICINE & REHABILITATION PROGRESS NOTE  Subjective/Complaints: Patient seen sitting up, working with therapy this morning discussed and interpreted with daughter-in-law by phone.  Patient slept well overnight.  Wife at bedside, who confirms.  Discussed improvement today with therapies.   ROS: Limited due to language/communication.  Objective: Vital Signs: Blood pressure 123/78, pulse 74, temperature 97.8 F (36.6 C), temperature source Oral, resp. rate 18, height 4\' 11"  (1.499 m), weight 48.9 kg, SpO2 95 %. No results found. Recent Labs    04/23/20 0535  WBC 11.2*  HGB 14.4  HCT 45.0  PLT 393   Recent Labs    04/24/20 0543  NA 136  K 4.7  CL 102  CO2 23  GLUCOSE 106*  BUN 35*  CREATININE 1.32*  CALCIUM 9.5    Intake/Output Summary (Last 24 hours) at 04/24/2020 1002 Last data filed at 04/23/2020 2015 Gross per 24 hour  Intake 240 ml  Output 600 ml  Net -360 ml       Physical Exam: BP 123/78 (BP Location: Right Arm)   Pulse 74   Temp 97.8 F (36.6 C) (Oral)   Resp 18   Ht 4\' 11"  (1.499 m)   Wt 48.9 kg   SpO2 95%   BMI 21.77 kg/m  Constitutional: No distress . Vital signs reviewed. HENT: Normocephalic.  Atraumatic. Eyes: EOMI. No discharge. Cardiovascular: No JVD.  RRR. Respiratory: Normal effort.  No stridor.  Bilateral clear to auscultation. GI: Non-distended.  BS +. Skin: Warm and dry.  Intact. Psych: Normal mood.  Normal behavior. Musc: No edema in extremities.  No tenderness in extremities. Neuro: Alert Left facial weakness, unchanged Left gaze preference LUE 0/5 proximal to distal LLE: HF 1/5, KE 3-/5, ADF 0/5 No increase in tone noted  Assessment/Plan: 1. Functional deficits which require 3+ hours per day of interdisciplinary therapy in a comprehensive inpatient rehab setting.  Physiatrist is providing close team supervision and 24 hour management of active medical problems listed below.  Physiatrist and rehab team  continue to assess barriers to discharge/monitor patient progress toward functional and medical goals   Care Tool:  Bathing    Body parts bathed by patient: Face,Chest,Abdomen,Left arm   Body parts bathed by helper: Right arm,Left arm,Chest,Abdomen,Front perineal area,Buttocks,Right upper leg,Left upper leg,Right lower leg,Left lower leg,Face     Bathing assist Assist Level: Maximal Assistance - Patient 24 - 49%     Upper Body Dressing/Undressing Upper body dressing   What is the patient wearing?: Pull over shirt    Upper body assist Assist Level: Maximal Assistance - Patient 25 - 49%    Lower Body Dressing/Undressing Lower body dressing      What is the patient wearing?: Pants,Incontinence brief     Lower body assist Assist for lower body dressing: Maximal Assistance - Patient 25 - 49% (bed level)     Toileting Toileting    Toileting assist Assist for toileting: Total Assistance - Patient < 25%     Transfers Chair/bed transfer  Transfers assist     Chair/bed transfer assist level: 2 Helpers     Locomotion Ambulation   Ambulation assist   Ambulation activity did not occur: Safety/medical concerns          Walk 10 feet activity   Assist  Walk 10 feet activity did not occur: Safety/medical concerns        Walk 50 feet activity   Assist Walk 50 feet with 2 turns activity did not occur: Safety/medical concerns  Walk 150 feet activity   Assist Walk 150 feet activity did not occur: Safety/medical concerns         Walk 10 feet on uneven surface  activity   Assist Walk 10 feet on uneven surfaces activity did not occur: Safety/medical concerns         Wheelchair     Assist Will patient use wheelchair at discharge?: Yes Type of Wheelchair: Manual Wheelchair activity did not occur: Safety/medical concerns         Wheelchair 50 feet with 2 turns activity    Assist    Wheelchair 50 feet with 2 turns activity  did not occur: Safety/medical concerns       Wheelchair 150 feet activity     Assist  Wheelchair 150 feet activity did not occur: Safety/medical concerns       Medical Problem List and Plan: 1.Left hemiparesissecondary to right basal ganglia intraparenchymal hemorrhage secondary to hypertensive crisis  Continue CIR  Team conference today to discuss current and goals and coordination of care, home and environmental barriers, and discharge planning with nursing, case manager, and therapies. Please see conference note from today as well.  2. Impaired mobility -DVT/anticoagulation:Continue Subcutaneous continue heparin initiated for 02/25/2020 -antiplatelet therapy: N/A 3. Pain Management:continue Tylenol as needed. D/c tramadol.  4. Mood:Provide emotional support -antipsychotic agents: N/A 5. Neuropsych: This patientis notcapable of making decisions on hisown behalf. 6. Skin/Wound Care:Routine skin checks 7. Fluids/Electrolytes/Nutrition:Routine in and outs. 8. Hypertension.   Norvasc 10 mg daily  Lopressor 50 mg twice daily.   D/c'ed clonidine.   Controlled on 4/20 9. History of open Uretlithotomy2005 in [place] as well as left PCNL and laser ureterotomyOctober 2018 followed by Dr. Marlou Porch. 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  -CONTINUE FOLEY TUBE.DO NOT REMOVE .Foley catheter to remain in place until follow-up in the office of Dr. Modena Slater for cystogram. 10. Diet controlled diabetes mellitus. Hemoglobin A1c 5.8. SSI -Educate pt/family as needed CBG (last 3)  Recent Labs    04/23/20 1642 04/23/20 2115 04/24/20 0607  GLUCAP 196* 92 108*   Metformin decreased back to 500mg  daily and restarted CBG checks.   Slightly labile on 4/20 11.  Leukocytosis  WBCs 11.2 on 4/19   Afebrile  Recheck 4/19 12.  Acute blood loss  anemia  Hemoglobin 14.4 on 4/19  Recheck 4/19 13.  AKI vs CKD   Creatinine 1.32 on 4/20  Encourage fluids 14.  Gout - in right knee  Improved- now off colchicine 15.  Slow transit constipation  Bowel meds increased on 4/8  Improving 16. Lethargy: increased ritalin to 5mg  BID  Appears to be improving 17. Emesis   Appears to have resolved   Changed Lipitor to HS to spread out medications   LOS: 13 days A FACE TO FACE EVALUATION WAS PERFORMED  Amit Meloy 6/8 04/24/2020, 10:02 AM

## 2020-04-24 NOTE — Progress Notes (Signed)
Speech Language Pathology Daily Session Note  Patient Details  Name: Anthony Huber MRN: [id number] Date of Birth: Dec 14, 1960  Today's Date: 04/24/2020 SLP Individual Time: 1302-1400 SLP Individual Time Calculation (min): 58 min  Short Term Goals: Week 2: SLP Short Term Goal 1 (Week 2): Patient will tolerate regular texture solids without significant c/o fatigue from mastication with supervisionA SLP Short Term Goal 2 (Week 2): Patient will perform basic level functional problem solving tasks with modA cues. SLP Short Term Goal 3 (Week 2): Patient will initiate during conversation and functional tasks with minA cues to perform consistently SLP Short Term Goal 4 (Week 2): Patient will demonstrate recall of daily events (therapeutic and nursing) with minA cues. SLP Short Term Goal 5 (Week 2): Patient will demonstrate adequate selective attention during tasks performance with modA cues.  Skilled Therapeutic Interventions:Skilled ST services focused on swallow and cognitive skills. SLP facilitated PO consumption of lunch tray presented as dys 3 textures, Pt was min A verbal cues to continue self-feeding and extra time for bolus manipulation/masictaion with ability to clear oral cavity. [name] reports slight increase in mastication time verse prior to hospitalization. SLP facilitated basic problem solving, verbal and function initiation in 3 step picture card sequence task of ADLs. Pt required mod A verbal cues fade to supervision A verbal cues for verbal problem solving due to reduced understanding and cultural differences. Pt required max A fade to mod A verbal cues for functional problem solving, sequencing. [name] reported 65% intelligibility improving to 90% with cuing, however p's wife reports 50% intelligibility.  Pt was left in room with wife, call bell within reach and bed alarm set. SLP recommends to continue skilled services.     Pain Pain Assessment Pain Score: 0-No  pain  Therapy/Group: Individual Therapy  [name]  [facility] 04/24/2020, 4:35 PM

## 2020-04-24 NOTE — Plan of Care (Signed)
  Problem: Consults Goal: RH STROKE PATIENT EDUCATION Description: See Patient Education module for education specifics  Outcome: Progressing Goal: Diabetes Guidelines if Diabetic/Glucose > 140 Description: If diabetic or lab glucose is > 140 mg/dl - Initiate Diabetes/Hyperglycemia Guidelines & Document Interventions  Outcome: Progressing   Problem: RH BOWEL ELIMINATION Goal: RH STG MANAGE BOWEL WITH ASSISTANCE Description: STG Manage Bowel with mod I Assistance. Outcome: Progressing   Problem: RH KNOWLEDGE DEFICIT Goal: RH STG INCREASE KNOWLEDGE OF DIABETES Description: Pt and family will demonstrated understanding of medication regimen, dietary and lifestyle modifications to better control blood glucose levels independently upon discharge.  Outcome: Progressing Goal: RH STG INCREASE KNOWLEDGE OF HYPERTENSION Description: Pt and family will demonstrated understanding of medication regimen, dietary and lifestyle modifications to better control blood pressure independently upon discharge.  Outcome: Progressing Goal: RH STG INCREASE KNOWLEGDE OF HYPERLIPIDEMIA Description: Pt and family will demonstrated understanding of medication regimen, dietary and lifestyle modifications to better control blood cholesterol levels independently upon discharge.  Outcome: Progressing Goal: RH STG INCREASE KNOWLEDGE OF STROKE PROPHYLAXIS Description: Pt and family will demonstrated understanding of medication regimen, dietary and lifestyle modifications to better control blood pressure levels and prevent stroke independently upon discharge.  Outcome: Progressing   Problem: RH BLADDER ELIMINATION Goal: RH STG MANAGE BLADDER WITH EQUIPMENT WITH ASSISTANCE Description: STG Manage Bladder With Equipment With mod Assistance Outcome: Progressing   

## 2020-04-24 NOTE — Progress Notes (Signed)
Patient ID: Anthony Huber, male   DOB: 11/09/1960, 60 y.o.   MRN: [id number]  Met with wife, pt and interpreter to update regarding team conference and contacted son via telephone to inform of progress this week and target date still 5/13. Discussed again with son to come in and attend therapies with pt, he reports he is working. Informed main goal is to get pt to a level where wife can manage him at home. Will continue to work on discharge needs. Wife is here daily and attending therapies with pt. Made son aware pt will be going home at a wheelchair level.  

## 2020-04-25 LAB — GLUCOSE, CAPILLARY
Glucose-Capillary: 103 mg/dL — ABNORMAL HIGH (ref 70–99)
Glucose-Capillary: 122 mg/dL — ABNORMAL HIGH (ref 70–99)
Glucose-Capillary: 127 mg/dL — ABNORMAL HIGH (ref 70–99)
Glucose-Capillary: 91 mg/dL (ref 70–99)

## 2020-04-25 MED ORDER — CHLORHEXIDINE GLUCONATE CLOTH 2 % EX PADS
6.0000 | MEDICATED_PAD | Freq: Two times a day (BID) | CUTANEOUS | Status: DC
  Administered 2020-04-25 – 2020-05-17 (×36): 6 via TOPICAL

## 2020-04-25 NOTE — Progress Notes (Signed)
[facility] PHYSICAL MEDICINE & REHABILITATION PROGRESS NOTE  Subjective/Complaints: Patient seen laying in bed this morning.  Discussed with son by telephone, who also translates.  Wife at bedside.  Son states patient slept well overnight.  He is about to begin therapies.  ROS: Limited due to communication  Objective: Vital Signs: Blood pressure 131/76, pulse 75, temperature 98.4 F (36.9 C), resp. rate 16, height 4\' 11"  (1.499 m), weight 48.9 kg, SpO2 95 %. No results found. Recent Labs    04/23/20 0535  WBC 11.2*  HGB 14.4  HCT 45.0  PLT 393   Recent Labs    04/24/20 0543  NA 136  K 4.7  CL 102  CO2 23  GLUCOSE 106*  BUN 35*  CREATININE 1.32*  CALCIUM 9.5    Intake/Output Summary (Last 24 hours) at 04/25/2020 1226 Last data filed at 04/25/2020 0818 Gross per 24 hour  Intake 480 ml  Output 600 ml  Net -120 ml       Physical Exam: BP 131/76 (BP Location: Right Arm)   Pulse 75   Temp 98.4 F (36.9 C)   Resp 16   Ht 4\' 11"  (1.499 m)   Wt 48.9 kg   SpO2 95%   BMI 21.77 kg/m  Constitutional: No distress . Vital signs reviewed. HENT: Normocephalic.  Atraumatic. Eyes: EOMI. No discharge. Cardiovascular: No JVD.  RRR. Respiratory: Normal effort.  No stridor.  Bilateral clear to auscultation. GI: Non-distended.  BS +. Skin: Warm and dry.  Intact. Psych: Normal mood.  Normal behavior. Musc: No edema in extremities.  No tenderness in extremities. Neuro: Alert Left facial weakness, stable Left gaze preference LUE 0/5 proximal to distal, unchanged LLE: HF 1/5, KE 3-/5, ADF 0/5, need significant encouragement No increase in tone noted  Assessment/Plan: 1. Functional deficits which require 3+ hours per day of interdisciplinary therapy in a comprehensive inpatient rehab setting.  Physiatrist is providing close team supervision and 24 hour management of active medical problems listed below.  Physiatrist and rehab team continue to assess barriers to  discharge/monitor patient progress toward functional and medical goals   Care Tool:  Bathing    Body parts bathed by patient: Face,Chest,Abdomen,Left arm   Body parts bathed by helper: Right arm,Left arm,Chest,Abdomen,Front perineal area,Buttocks,Right upper leg,Left upper leg,Right lower leg,Left lower leg,Face     Bathing assist Assist Level: Maximal Assistance - Patient 24 - 49%     Upper Body Dressing/Undressing Upper body dressing   What is the patient wearing?: Pull over shirt    Upper body assist Assist Level: Maximal Assistance - Patient 25 - 49%    Lower Body Dressing/Undressing Lower body dressing      What is the patient wearing?: Pants,Incontinence brief     Lower body assist Assist for lower body dressing: Maximal Assistance - Patient 25 - 49% (bed level)     Toileting Toileting    Toileting assist Assist for toileting: Total Assistance - Patient < 25%     Transfers Chair/bed transfer  Transfers assist     Chair/bed transfer assist level: 2 Helpers     Locomotion Ambulation   Ambulation assist   Ambulation activity did not occur: Safety/medical concerns          Walk 10 feet activity   Assist  Walk 10 feet activity did not occur: Safety/medical concerns        Walk 50 feet activity   Assist Walk 50 feet with 2 turns activity did not occur: Safety/medical concerns  Walk 150 feet activity   Assist Walk 150 feet activity did not occur: Safety/medical concerns         Walk 10 feet on uneven surface  activity   Assist Walk 10 feet on uneven surfaces activity did not occur: Safety/medical concerns         Wheelchair     Assist Will patient use wheelchair at discharge?: Yes Type of Wheelchair: Manual Wheelchair activity did not occur: Safety/medical concerns         Wheelchair 50 feet with 2 turns activity    Assist    Wheelchair 50 feet with 2 turns activity did not occur: Safety/medical  concerns       Wheelchair 150 feet activity     Assist  Wheelchair 150 feet activity did not occur: Safety/medical concerns       Medical Problem List and Plan: 1.Left hemiparesissecondary to right basal ganglia intraparenchymal hemorrhage secondary to hypertensive crisis  Continue CIR 2. Impaired mobility -DVT/anticoagulation:Continue Subcutaneous continue heparin initiated for 02/25/2020 -antiplatelet therapy: N/A 3. Pain Management:continue Tylenol as needed. D/c tramadol.  4. Mood:Provide emotional support -antipsychotic agents: N/A 5. Neuropsych: This patientis notcapable of making decisions on hisown behalf. 6. Skin/Wound Care:Routine skin checks 7. Fluids/Electrolytes/Nutrition:Routine in and outs. 8. Hypertension.   Norvasc 10 mg daily  Lopressor 50 mg twice daily.   D/c'ed clonidine.   Controlled on 4/21 9. History of open Uretlithotomy2005 in [place] as well as left PCNL and laser ureterotomyOctober 2018 followed by Dr. Marlou Porch. 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  -CONTINUE FOLEY TUBE.DO NOT REMOVE .Foley catheter to remain in place until follow-up in the office of Dr. Modena Slater for cystogram. 10. Diet controlled diabetes mellitus. Hemoglobin A1c 5.8. SSI -Educate pt/family as needed CBG (last 3)  Recent Labs    04/24/20 2101 04/25/20 0610 04/25/20 1140  GLUCAP 127* 103* 122*   Metformin decreased back to 500mg  daily and restarted CBG checks.   Relatively controlled on 4/21 11.  Leukocytosis  WBCs 11.2 on 4/19   Afebrile  Recheck 4/19 12.  Acute blood loss anemia  Hemoglobin 14.4 on 4/19  Recheck 4/19 13.  AKI vs CKD   Creatinine 1.32 to on 4/20, labs ordered for tomorrow  Encourage fluids 14.  Gout - in right knee  Improved- now off colchicine 15.  Slow transit constipation  Bowel meds  increased on 4/8  Improving 16. Lethargy: increased ritalin to 5mg  BID  Improving 17. Emesis   Appears to have resolved   Changed Lipitor to HS to spread out medications   LOS: 14 days A FACE TO FACE EVALUATION WAS PERFORMED  Anaisha Mago 6/8 04/25/2020, 12:26 PM

## 2020-04-25 NOTE — Progress Notes (Signed)
Nutrition Follow-up  DOCUMENTATION CODES:   Not applicable  INTERVENTION:   - Please obtain updated weight  - Continue Glucerna Shake po TID, each supplement provides 220 kcal and 10 grams of protein  - Continue MVI with minerals daily  NUTRITION DIAGNOSIS:   Increased nutrient needs related to chronic illness (CAD, DM) as evidenced by estimated needs.  Ongoing, being addressed via supplements  GOAL:   Patient will meet greater than or equal to 90% of their needs  Progressing  MONITOR:   PO intake,Supplement acceptance,Labs,Weight trends,Skin,I & O's  REASON FOR ASSESSMENT:   Consult Assessment of nutrition requirement/status  ASSESSMENT:   Anthony Huber is a 60 year old right-handed male with history of open ureterlithotomy 2005 in [place] as well as left PCNL and laser ureterotomy October 2018 per Dr. Marlou Porch, CAD, diabetes mellitus and hypertension with remote tobacco use.  Noted target d/c date remains 5/13.  No new weight since 4/07. Please obtain updated weight.  Discussed pt with RN who reports pt eating fairly well and is drinking some Glucerna supplements. PO intake has been picking up over the last few days with 75-100% meal completions documented for the last 4 meals.  Pt unavailable at time of RD visit. Will continue with current supplement regimen at this time.  Meal Completion: 40-100% x last 8 documented meals (averaging 77%)  Medications reviewed and include: Glucerna Shake TID, metformin, ritalin, MVI with minerals daily, protonix, miralax, senna  Labs reviewed: BUN 35, creatinine 1.32 CBG's: 103-135 x 24 hours  Diet Order:   Diet Order            Diet Carb Modified Fluid consistency: Thin; Room service appropriate? Yes with Assist  Diet effective now                 EDUCATION NEEDS:   Education needs have been addressed  Skin:  Skin Assessment: Reviewed RN Assessment  Last BM:  04/24/20 type 6  Height:   Ht Readings from Last 1  Encounters:  04/11/20 4\' 11"  (1.499 m)    Weight:   Wt Readings from Last 1 Encounters:  04/11/20 48.9 kg    Ideal Body Weight:  44.5 kg  BMI:  Body mass index is 21.77 kg/m.  Estimated Nutritional Needs:   Kcal:  1450-1650  Protein:  65-80 grams  Fluid:  > 1.4 L    06/11/20, MS, RD, LDN Inpatient Clinical Dietitian Please see [facility] for contact information.

## 2020-04-25 NOTE — Progress Notes (Signed)
Speech Language Pathology Daily Session Note  Patient Details  Name: Anthony Huber MRN: [id number] Date of Birth: [birth year]  Today's Date: 04/25/2020 SLP Individual Time: 1300-1330 SLP Individual Time Calculation (min): 30 min  Short Term Goals: Week 2: SLP Short Term Goal 1 (Week 2): Patient will tolerate regular texture solids without significant c/o fatigue from mastication with supervisionA SLP Short Term Goal 2 (Week 2): Patient will perform basic level functional problem solving tasks with modA cues. SLP Short Term Goal 3 (Week 2): Patient will initiate during conversation and functional tasks with minA cues to perform consistently SLP Short Term Goal 4 (Week 2): Patient will demonstrate recall of daily events (therapeutic and nursing) with minA cues. SLP Short Term Goal 5 (Week 2): Patient will demonstrate adequate selective attention during tasks performance with modA cues.  Skilled Therapeutic Interventions: Skilled SLP intervention focused on dysphagia. Pt received regular lunch tray and thin liquids. He demonstrated increased rate of intake with rice and needed cues to clear all food before taking additional bites. Pt only seen with rice and whole cooked carrot slices. No meat was present on lunch tray for trials. Demonstrated adequate oral clearance and tolerated with no overt s/sx of aspiration or penetration. [name] and SLP Assisted wife with ordering food for patient for next 2 days. Recommend pt cont with current diet. Cont with therapy per plan of care.      Pain Pain Assessment Pain Scale: Faces Pain Score: 0-No pain Faces Pain Scale: No hurt  Therapy/Group: Individual Therapy  Carlean Jews Brenleigh Collet 04/25/2020, 1:48 PM

## 2020-04-25 NOTE — Progress Notes (Signed)
Physical Therapy Weekly Progress Note  Patient Details  Name: Anthony Huber MRN: [id number] Date of Birth: 1960/01/30  Beginning of progress report period: April 18, 2020 End of progress report period: April 25, 2020  Today's Date: 04/25/2020 PT Individual Time: 0800-0900 + 1340-1455 PT Individual Time Calculation (min): 60 min  + 15 min  Patient has met 2 of 3 short term goals. Pt is making steady progress towards functional mobility goals. He is improving his initiation, ability to follow commands, attend to L hemibody, and improve overall alertness. Bed mobility fluctuates b/w mod to maxA depending on fatigue level and amount of initiation. He typically with requires maxA for initial sitting balance but progress to minA after a few minutes. He requires maxA of 1 person for squat<>pivot transfers. Therapy has began implementing sit<>stand transfers which require maxA +2 with both Clarise Cruz Plus and with no AD. He has also shown improved LLE strength with 2-/5 L hip flexion and 3/5 L quad extension. Pt has very good participation with therapies - language barrier sometimes affecting performance/instructions.  Patient continues to demonstrate the following deficits muscle weakness, decreased cardiorespiratoy endurance, impaired timing and sequencing, unbalanced muscle activation, motor apraxia and decreased motor planning, decreased visual acuity, decreased visual perceptual skills and ?field cut (L), decreased midline orientation, decreased attention to left and decreased motor planning, decreased initiation, decreased attention, decreased awareness, decreased problem solving, decreased safety awareness and delayed processing and decreased sitting balance, decreased standing balance, decreased postural control, hemiplegia and decreased balance strategies and therefore will continue to benefit from skilled PT intervention to increase functional independence with mobility.  Patient progressing toward long term  goals. Continue plan of care.  PT Short Term Goals Week 2:  PT Short Term Goal 1 (Week 2): Pt will complete supine<>sit with modA and use of hospital bed features PT Short Term Goal 1 - Progress (Week 2): Partly met (Performance level fluctuates) PT Short Term Goal 2 (Week 2): Pt will sit unsupported with modA x10 minutes PT Short Term Goal 2 - Progress (Week 2): Met PT Short Term Goal 3 (Week 2): Pt will perform bed<>chair transfer with maxA of 1 person PT Short Term Goal 3 - Progress (Week 2): Met Week 3:  PT Short Term Goal 1 (Week 3): Pt will consistently perform bed mobility with modA PT Short Term Goal 2 (Week 3): Pt will consistently perform bed<>chair transfers wtih modA and LRAD PT Short Term Goal 3 (Week 3): Pt will perform sit<>stand transfers with modA and LRAD PT Short Term Goal 4 (Week 3): Pt will sit unsupported for >10 minutes with no more than minA  Skilled Therapeutic Interventions/Progress Updates:     1st session: Pt received supine in bed, wife at bedside, pt agreeable to therapy. No interpreter present. Pt appears to be in no pain. Supine<>sit with maxA with hospital bed features. Requires maxA for initial sitting EOB, progressing to modA. Donned button-up shirt with totalA while seated EOB while requiring maxA for sitting balance - wife assist with donning shirt. 6inch platform under BLE's. Squat<>pivot transfer towards his L with maxA to TIS w/c. Needing maxA for repositioning in w/c. W/c transport to main rehab gym for time management.  Demonstrated Stedy with wife and pt observing to improve carryover into his ability. Pt performed Stedy transfer from TIS w/c to mat table, - needing modA to power to rise and minA for sitting balance in perched position on [place].   With mirror in front of him for visual feedback,  worked on functional sit<>stands in [place]. Ace-wrapped his LUE to [facility] bar as pt unable to maintain grip to bar with LUE. Pt progressed from sit<>stands from  perched position with modA to CGA (!!!!) and then progressed to sit<>stands from mat table height with modA to minA (!!!). Pt needing tactile cues and assist for faciliatiting thoracic extension and hip extension as well as promoting R lean and L trunk elongation.   Worked on sitting balance in Perched position in [place] - completed targeted reaching with cones in multi plane directions including midline, ipsilateral, and upward reach with his RUE. Pt needing minA for sittting balance while doing this.   Performed trials of standing with NO UE support within the [facility] and he was able to do this with min/modA (!!). Increased assist needed due to increased L lateral lean and difficulty achieving midline.   Pt with excellent participation and had a great session. Handoff of care to OT with pt sitting edge of mat in [place] with wife present as well.   2nd session: Pt greeted seated in manual w/c, agreeable to therapy. Wife and interpeter at bedside. Pt reports no pain but does endorse fatigue from sitting in up w/c all day, requesiting to get back to bed at end of therapy session. Pt needing modA for repositioning in manual w/c with multi-modal cues needed. In [place], performed several repeated sit<>stands from perched position with mostly minA while using bathroom mirror for visual feedback for midline awareness. He required minA for sit<>stand in [place] from w/c height as well. Stedy transfer to EOB and needed maxA for sit>supine. Pt remained semi-reclined in bed at end of session with LUE supported with pillow. Needs in reach and bed alarm on. Pt appreciative of therapy services.    Therapy Documentation Precautions:  Restrictions Weight Bearing Restrictions: No General:    Therapy/Group: Individual Therapy  Cayle Thunder P Jazelle Achey PT 04/25/2020, 7:42 AM

## 2020-04-25 NOTE — Progress Notes (Signed)
Occupational Therapy Session Note  Patient Details  Name: Anthony Huber MRN: [id number] Date of Birth: 1960/04/25  Today's Date: 04/25/2020 OT Individual Time: 0900-1000 OT Individual Time Calculation (min): 60 min    Short Term Goals: Week 2:  OT Short Term Goal 1 (Week 2): Pt will be able to sit EOB with mod A in prep for self care. OT Short Term Goal 2 (Week 2): Pt will demonstrate improved L side awareness with bathing UB with min a. OT Short Term Goal 3 (Week 2): Pt will demonstrate improved apraxia to don shirt with mod a. OT Short Term Goal 4 (Week 2): Pt will transfer bed>< wc with mod A of 2.  Skilled Therapeutic Interventions/Progress Updates:    Pt seen this session (hand off from PT sitting on mat) to work on sit to stand skills using the stedy to work on transfers to standard wc and BSC. Obtained a standard wc for pt to trial,  Lifted front of seat to "dump" seat slightly to prevent forward wt shift and padded foot rests to acomodate pt's leg length.   Cued his wife to only have the interpretor speak as pt was having difficulty processing 2 voices at once.  Much improved ability to follow directions.  Pt demonstrated significant progress this session: -Sit to stand in stedy with only min A  - squat pivot to R with min, to L with mod ( multiple trials)  -scooting back in wc seat with min A  -static sitting balance close S. Pt able to sit on BSC without support  Practiced a "dry run" as pt did not need to toilet of stedy to [facility] in room.  This process went very well. Updated safety plan for nursing to use this.    Also a few minutes of LUE A/arom practice with table top arm slides.    Pt in room in new wc with belt alarm on and all needs met.     Therapy Documentation Precautions:  Restrictions Weight Bearing Restrictions: No       Pain: Pain Assessment Pain Score: 0-No pain ADL: ADL Grooming: Moderate assistance Upper Body Bathing: Maximal assistance Where  Assessed-Upper Body Bathing: Bed level Lower Body Bathing: Dependent Where Assessed-Lower Body Bathing: Bed level Upper Body Dressing: Maximal assistance Where Assessed-Upper Body Dressing: Chair Lower Body Dressing: Dependent Where Assessed-Lower Body Dressing: Bed level Toileting: Dependent Where Assessed-Toileting: Bed level Toilet Transfer: Moderate assistance Toilet Transfer Method: Other (comment) (stedy) Toilet Transfer Equipment: Bedside commode   Therapy/Group: Individual Therapy  Shaya Altamura 04/25/2020, 12:54 PM

## 2020-04-26 LAB — BASIC METABOLIC PANEL
Anion gap: 8 (ref 5–15)
BUN: 21 mg/dL (ref 8–23)
CO2: 25 mmol/L (ref 22–32)
Calcium: 9.5 mg/dL (ref 8.9–10.3)
Chloride: 102 mmol/L (ref 98–111)
Creatinine, Ser: 1.09 mg/dL (ref 0.61–1.24)
GFR, Estimated: >60 mL/min (ref >60)
Glucose, Bld: 105 mg/dL — ABNORMAL HIGH (ref 70–99)
Potassium: 4.6 mmol/L (ref 3.5–5.1)
Sodium: 135 mmol/L (ref 135–145)

## 2020-04-26 LAB — GLUCOSE, CAPILLARY
Glucose-Capillary: 115 mg/dL — ABNORMAL HIGH (ref 70–99)
Glucose-Capillary: 122 mg/dL — ABNORMAL HIGH (ref 70–99)
Glucose-Capillary: 143 mg/dL — ABNORMAL HIGH (ref 70–99)
Glucose-Capillary: 127 mg/dL — ABNORMAL HIGH (ref 70–99)

## 2020-04-26 NOTE — Progress Notes (Addendum)
[facility] PHYSICAL MEDICINE & REHABILITATION PROGRESS NOTE  Subjective/Complaints: Patient seen laying in bed this morning.  Wife at bedside.  Communicated with patient and wife via son on telephone.  Patient slept well overnight.  ROS: Appears to deny CP, shortness of breath, nausea, vomiting, diarrhea.  Objective: Vital Signs: Blood pressure 118/82, pulse 82, temperature 97.6 F (36.4 C), resp. rate 18, height 4\' 11"  (1.499 m), weight 43.1 kg, SpO2 96 %. No results found. No results for input(s): WBC, HGB, HCT, PLT in the last 72 hours. Recent Labs    04/24/20 0543 04/26/20 0610  NA 136 135  K 4.7 4.6  CL 102 102  CO2 23 25  GLUCOSE 106* 105*  BUN 35* 21  CREATININE 1.32* 1.09  CALCIUM 9.5 9.5    Intake/Output Summary (Last 24 hours) at 04/26/2020 1307 Last data filed at 04/26/2020 0700 Gross per 24 hour  Intake 236 ml  Output 500 ml  Net -264 ml       Physical Exam: BP 118/82 (BP Location: Right Arm)   Pulse 82   Temp 97.6 F (36.4 C)   Resp 18   Ht 4\' 11"  (1.499 m)   Wt 43.1 kg   SpO2 96%   BMI 19.19 kg/m  Constitutional: No distress . Vital signs reviewed. HENT: Normocephalic.  Atraumatic. Eyes: EOMI. No discharge. Cardiovascular: No JVD.  RRR. Respiratory: Normal effort.  No stridor.  Bilateral clear to auscultation. GI: Non-distended.  BS +. Skin: Warm and dry.  Intact. Psych: Normal mood.  Normal behavior. Musc: No edema in extremities.  No tenderness in extremities. Neuro: Alert Left facial weakness, stable Left gaze preference LUE 0/5 proximal to distal, persistent LLE: HF 1+/5, KE 3-/5, ADF 0/5, need encouragement at times No increase in tone noted  Assessment/Plan: 1. Functional deficits which require 3+ hours per day of interdisciplinary therapy in a comprehensive inpatient rehab setting.  Physiatrist is providing close team supervision and 24 hour management of active medical problems listed below.  Physiatrist and rehab team continue  to assess barriers to discharge/monitor patient progress toward functional and medical goals   Care Tool:  Bathing    Body parts bathed by patient: Face,Chest,Abdomen,Left arm,Front perineal area,Right upper leg,Left upper leg   Body parts bathed by helper: Right arm,Buttocks,Right lower leg,Left lower leg     Bathing assist Assist Level: Moderate Assistance - Patient 50 - 74%     Upper Body Dressing/Undressing Upper body dressing   What is the patient wearing?: Pull over shirt    Upper body assist Assist Level: Moderate Assistance - Patient 50 - 74%    Lower Body Dressing/Undressing Lower body dressing      What is the patient wearing?: Pants,Incontinence brief     Lower body assist Assist for lower body dressing: Maximal Assistance - Patient 25 - 49%     Toileting Toileting    Toileting assist Assist for toileting: Total Assistance - Patient < 25%     Transfers Chair/bed transfer  Transfers assist     Chair/bed transfer assist level: Dependent - mechanical lift     Locomotion Ambulation   Ambulation assist   Ambulation activity did not occur: Safety/medical concerns          Walk 10 feet activity   Assist  Walk 10 feet activity did not occur: Safety/medical concerns        Walk 50 feet activity   Assist Walk 50 feet with 2 turns activity did not occur: Safety/medical concerns  Walk 150 feet activity   Assist Walk 150 feet activity did not occur: Safety/medical concerns         Walk 10 feet on uneven surface  activity   Assist Walk 10 feet on uneven surfaces activity did not occur: Safety/medical concerns         Wheelchair     Assist Will patient use wheelchair at discharge?: Yes Type of Wheelchair: Manual Wheelchair activity did not occur: Safety/medical concerns         Wheelchair 50 feet with 2 turns activity    Assist    Wheelchair 50 feet with 2 turns activity did not occur: Safety/medical  concerns       Wheelchair 150 feet activity     Assist  Wheelchair 150 feet activity did not occur: Safety/medical concerns       Medical Problem List and Plan: 1.Left hemiparesissecondary 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 Impaired mobility -DVT/anticoagulation:Continue Subcutaneous continue heparin initiated for 02/25/2020 -antiplatelet therapy: N/A 3. Pain Management:continue Tylenol as needed. D/c tramadol.  4. Mood:Provide emotional support -antipsychotic agents: N/A 5. Neuropsych: This patientis notcapable of making decisions on hisown behalf. 6. Skin/Wound Care:Routine skin checks 7. Fluids/Electrolytes/Nutrition:Routine in and outs. 8. Hypertension.   Norvasc 10 mg daily  Lopressor 50 mg twice daily.   D/c'ed clonidine.   Controlled on 4/22 9. History of open Uretlithotomy2005 in [place] as well as left PCNL and laser ureterotomyOctober 2018 followed by Dr. Marlou Porch. 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  -CONTINUE FOLEY TUBE.DO NOT REMOVE .Foley catheter to remain in place until follow-up in the office of Dr. Modena Slater for cystogram. 10. Diet controlled diabetes mellitus. Hemoglobin A1c 5.8. SSI -Educate pt/family as needed CBG (last 3)  Recent Labs    04/25/20 2120 04/26/20 0637 04/26/20 1122  GLUCAP 91 115* 122*   Metformin decreased back to 500mg  daily and restarted CBG checks.   Mildly elevated on 4/22 11.  Leukocytosis  WBCs 11.2 on 4/19, labs ordered for Monday  Afebrile  Recheck 4/19 12.  Acute blood loss anemia  Hemoglobin 14.4 on 4/19  Recheck 4/19 13.  CKD   Creatinine 1.09 on 4/22  Encourage fluids 14.  Gout - in right knee  Improved- now off colchicine 15.  Slow transit constipation  Bowel meds  increased on 4/8  Improved 16. Lethargy: increased ritalin to 5mg  BID  Improving 17. Emesis   Appears to have resolved   Changed Lipitor to HS to spread out medications   LOS: 15 days A FACE TO FACE EVALUATION WAS PERFORMED  Virgel Haro 6/8 04/26/2020, 1:07 PM

## 2020-04-26 NOTE — Progress Notes (Signed)
Orthopedic Tech Progress Note Patient Details:  Anthony Huber Feb 24, 1960 [id number] Ordered brace Patient ID: [name], male   DOB: 1960-03-26, 60 y.o.   MRN: [id number]   [name] 04/26/2020, 6:27 PM

## 2020-04-26 NOTE — Progress Notes (Signed)
Speech Language Pathology Weekly Progress and Session Note  Patient Details  Name: Cobin Cadavid MRN: [id number] Date of Birth: May 06, 1960  Beginning of progress report period: 04/19/2020 End of progress report period: 04/26/2020  Today's Date: 04/26/2020 SLP Individual Time: 0930-1000 SLP Individual Time Calculation (min): 30 min  Short Term Goals: Week 2: SLP Short Term Goal 1 (Week 2): Patient will tolerate regular texture solids without significant c/o fatigue from mastication with supervisionA SLP Short Term Goal 1 - Progress (Week 2): Met SLP Short Term Goal 2 (Week 2): Patient will perform basic level functional problem solving tasks with modA cues. SLP Short Term Goal 2 - Progress (Week 2): Met SLP Short Term Goal 3 (Week 2): Patient will initiate during conversation and functional tasks with minA cues to perform consistently SLP Short Term Goal 3 - Progress (Week 2): Progressing toward goal SLP Short Term Goal 4 (Week 2): Patient will demonstrate recall of daily events (therapeutic and nursing) with minA cues. SLP Short Term Goal 4 - Progress (Week 2): Progressing toward goal SLP Short Term Goal 5 (Week 2): Patient will demonstrate adequate selective attention during tasks performance with modA cues. SLP Short Term Goal 5 - Progress (Week 2): Met    New Short Term Goals: Week 3: SLP Short Term Goal 1 (Week 3): Patient will initiate during conversation and functional tasks with minA cues to perform consistently SLP Short Term Goal 2 (Week 3): Patient will perform basic level functional and simulated problem solving tasks with minA to initiate and maintain attention. SLP Short Term Goal 3 (Week 3): Patient will recall and describe at least two daily events (nursing and/or therapy related) with minA cues. SLP Short Term Goal 4 (Week 3): Patient will demonstrate awareness to errors during structured task completion with modA cues.  Weekly Progress Updates:  Patient made good progress  and met 4/6 STG';s and making progress to those he did not meet. He is demonstrating improved alertness and participation but continues with significant impairments in memory, awareness, attention. He had his diet consistency changed briefly from regular to dysphagia 3 but has since had that upgraded back to regular.    Intensity: Minumum of 1-2 x/day, 30 to 90 minutes Frequency: 3 to 5 out of 7 days Duration/Length of Stay: 5/13 Treatment/Interventions: Environmental controls;Functional tasks;Speech/Language facilitation;Patient/family education;Internal/external aids;Cueing hierarchy;Cognitive remediation/compensation   Daily Session  Skilled Therapeutic Interventions: Patient seen for skilled ST session with wife and interpreter both present. Patient was sitting in [facility] when SLP arrived and had already completed PT and OT sessions back to back. He was noticeably fatigued and had significant difficulty maintaining adequate participation in activities. During simplified task of describing picture scenes, he required mod-maxA cues to initiate responses and to elaborate upon responses. He was able to complete transfer to bed from [facility] via Stedy with min-modA. Patient continues to benefit from skilled SLP intervention to maximize cognitive-linguistic function prior to discharge.  General    Pain Pain Assessment Pain Scale: 0-10 Pain Score: 0-No pain  Therapy/Group: Individual Therapy  Sonia Baller, MA, CCC-SLP 04/26/20 4:22 PM

## 2020-04-26 NOTE — Progress Notes (Signed)
Physical Therapy Session Note  Patient Details  Name: Anthony Huber MRN: [id number] Date of Birth: 1960/11/21  Today's Date: 04/26/2020 PT Individual Time: 9371-6967 + 1300-1356 PT Individual Time Calculation (min): 45 min  + 56 min  Short Term Goals: Week 3:  PT Short Term Goal 1 (Week 3): Pt will consistently perform bed mobility with modA PT Short Term Goal 2 (Week 3): Pt will consistently perform bed<>chair transfers wtih modA and LRAD PT Short Term Goal 3 (Week 3): Pt will perform sit<>stand transfers with modA and LRAD PT Short Term Goal 4 (Week 3): Pt will sit unsupported for >10 minutes with no more than minA  Skilled Therapeutic Interventions/Progress Updates:      1st session: Pt greeted supine in bed, wife at bedside, no interpreter present. Pt appears to be in no pain. Supine<>sit with modA +2 (wife assisting) with hospital bed features. Requires maxA for initial sitting balance, progressing to minA. Sit<>stand in [place] with minA with cues for general sequencing and reducing L lateral lean. Able to sit in perched position with minA due to L lateral lean which he is able to correct. Stedy transfer to w/c with totalA and pt able to stand from perched position with CGA but needing minA for standing balance. While seated in w/c, wife assisted with doffing and donning a t-shirt. RN present as well for morning medications. W/c transport for time management to main rehab gym. Retrieved hemi w/c to switch out for regular manual w/c as next session will focus on w/c propulsion with hemi technique. Stedy transfer with minA (similar fashion as above) from w/c to mat table - needing cues for general sequencing and initiation. Able to sit edge of mat with CGA while unsupported. Used mirror for visual feedback and performed repeated sit<>stands in [place], requiring mostly minA and cues for midline awareness. Worked on sitting balance in perched position on [place] - needing CGA/minA for balance while  performing ball toss with wife (unweighted ball). Pt then completed modified sit-ups from edge of mat table, needing minA to go from partial lying to sitting up fully - wife assisting with cues (pt and wife non [place] speaking). Improved trunk activation with this activity. Handoff of care to OT at end of session with patient sitting edge of mat table.   2nd session: Pt received supine in bed, wife and intrepter at bedside. Pt agreeable ot therapy but reports generalized fatigue. Donned slip on shoes with totalA for time management. Supine<>sit with modA with bed features. Pt reports being cold so wife assist with donning a 2nd t-shirt with maxA while seated EOB with modA for sitting balance. Squat<>pivot transfer with modA from EOB to hemi height w/c. W/c transport to main rehab gym.  Focused first 1/2 of session on w/c mobility. Provided multiple forms of demonstration to improve carryover. Pt able to use R hemibody to propel but needs min/modA for maintaining straight path. Pt propelled 271ft + 2103ft with min/modA due to veering L. Pt with difficulty (motor apraxia?) impacting ability to coordinate R leg and R arm during propulsion. Also demonstrates excessive sacral sitting in w/c during w/c propulsion and has difficulty maintaining upright posture during w/c mobility.   Focused second 1/2 of session on gait training in hallway. With mirror for visual feedback and R hand rail with wife providing +2 assist by pushing w/c for safety, pt ambulated 13ft + 49ft (!!!). L foot ace-wrapped for DF assist. Therapist on stool with pt's L arm draped over therapist shoulder.  Needed assist for trunk/hip extension and totalA for LLE management including swing, stabilizing stance, and stepping.   Pt returned to room in w/c. Squat<>pivot transfer with modA back to bed from hemi w/c and needing maxA for sit>supine for LE management. Pt remained semi-reclined in bed with needs in reach at end of session, bed alarm on.    Therapy Documentation Precautions:  Restrictions Weight Bearing Restrictions: No General:    Therapy/Group: Individual Therapy  Nao Linz P [place] Fan[place] PT 04/26/2020, 7:31 AM

## 2020-04-26 NOTE — Progress Notes (Signed)
Occupational Therapy Weekly Progress Note  Patient Details  Name: Anthony Huber MRN: [id number] Date of Birth: 29-May-1960  Beginning of progress report period: April 19, 2020 End of progress report period: April 26, 2020  Today's Date: 04/26/2020 OT Individual Time: 6378-5885 OT Individual Time Calculation (min): 45 min    Patient has met 4 of 4 short term goals.  Pt has made excellent progress this week with trunk control/balance/midline awareness, praxis with sit to stands and transfers, attention to task.  His transfers are becoming easier and this week we will be able to move into pt doing more self care tasks.  Overall pt has progressed from dependent to mod/max with his self care and he is now able to sit on a BSC with occasional min A. He continues to have a severely delayed balance reaction so that impacts his ability to do more independently.  Patient continues to demonstrate the following deficits: abnormal tone, unbalanced muscle activation and motor apraxia, decreased visual perceptual skills and decreased visual motor skills, decreased midline orientation and decreased attention to left, decreased awareness, decreased problem solving, decreased safety awareness and delayed processing and decreased sitting balance, decreased standing balance, decreased postural control and hemiplegia and therefore will continue to benefit from skilled OT intervention to enhance overall performance with BADL and Reduce care partner burden.  Patient progressing toward long term goals..  Continue plan of care.  OT Short Term Goals Week 1:  OT Short Term Goal 1 (Week 1): pt will sit EOB wiht MAX A of 1 for 3 min in prep for ADL OT Short Term Goal 1 - Progress (Week 1): Met OT Short Term Goal 2 (Week 1): Pt will manage LUE with multimodal cuing in prep for UB dressing OT Short Term Goal 2 - Progress (Week 1): Progressing toward goal OT Short Term Goal 3 (Week 1): Pt will intiate during transfer training 75%  of trials to demo improved command following OT Short Term Goal 3 - Progress (Week 1): Met OT Short Term Goal 4 (Week 1): Pt will groom with MOD A OT Short Term Goal 4 - Progress (Week 1): Met Week 2:  OT Short Term Goal 1 (Week 2): Pt will be able to sit EOB with mod A in prep for self care. OT Short Term Goal 1 - Progress (Week 2): Met OT Short Term Goal 2 (Week 2): Pt will demonstrate improved L side awareness with bathing UB with min a. OT Short Term Goal 2 - Progress (Week 2): Met OT Short Term Goal 3 (Week 2): Pt will demonstrate improved apraxia to don shirt with mod a. OT Short Term Goal 3 - Progress (Week 2): Met OT Short Term Goal 4 (Week 2): Pt will transfer bed>< wc with mod A of 2. OT Short Term Goal 4 - Progress (Week 2): Met Week 3:  OT Short Term Goal 1 (Week 3): Pt will complete sq pivot transfer from wc >< BSC with mod A of 1. OT Short Term Goal 2 (Week 3): Pt will be able to sit to stand with min A of 1 and hold balance with mod A to prep for clothing management. OT Short Term Goal 3 (Week 3): Pt will be able to don a shirt with min A.  Skilled Therapeutic Interventions/Progress Updates:     (interpretor and spouse present)  Pt received in hand off from PT.  Used stedy to transfer back to wc.  Pt taken to room and used stedy with min  A to sit on BSC.  Pt initially leaning to Left and needed max A to correct, it took several minutes for pt to adjust to sitting independently but he was able to achieve STATIC sit with close S.  Once we integrated task of removing shirt, washing UB and donning new shirt (mod A overall) he needed constant cues and physical A to not lean to left.  He struggled with divided attention.  Pt was able to wash his perineal area and upper thighs and place R foot into pant leg.  Used stedy for support with sit to stands for therapist to wash his bottom and to adjust clothing over hips.   Pt participated well but was less energetic today.  Provided pt with  L half lap tray.  trialed pt self propelling wc with R hand and foot. Due to his short leg length, pt was reaching his leg forward for enough of a heel strike to move chair, but this was causing him to slide forward.  Informed PT of this for adjustments of the chair to be worked on in his next session.    Pt in chair with belt alarm on and all needs met.   Therapy Documentation Precautions:  Restrictions Weight Bearing Restrictions: No   Pain: Pain Assessment Pain Score: 0-No pain ADL: ADL Grooming: Moderate assistance Upper Body Bathing: Moderate assistance Where Assessed-Upper Body Bathing: Other (Comment) (BSC) Lower Body Bathing: Moderate assistance Where Assessed-Lower Body Bathing: Other (Comment) (BSC) Upper Body Dressing: Moderate assistance Where Assessed-Upper Body Dressing:  (BSC) Lower Body Dressing: Maximal assistance Where Assessed-Lower Body Dressing: Other (Comment) (BSC) Toileting: Dependent Where Assessed-Toileting: Bedside Commode Toilet Transfer: Minimal assistance Toilet Transfer Method: Other (comment) (stedy) Toilet Transfer Equipment: Bedside commode   Therapy/Group: Individual Therapy  [place] 04/26/2020, 10:12 AM

## 2020-04-27 LAB — CBC WITH DIFFERENTIAL/PLATELET
Abs Immature Granulocytes: 0.04 10*3/uL (ref 0.00–0.07)
Basophils Absolute: 0.1 10*3/uL (ref 0.0–0.1)
Basophils Relative: 1 %
Eosinophils Absolute: 1.5 10*3/uL — ABNORMAL HIGH (ref 0.0–0.5)
Eosinophils Relative: 13 %
HCT: 37.7 % — ABNORMAL LOW (ref 39.0–52.0)
Hemoglobin: 12 g/dL — ABNORMAL LOW (ref 13.0–17.0)
Immature Granulocytes: 0 %
Lymphocytes Relative: 18 %
Lymphs Abs: 2.1 10*3/uL (ref 0.7–4.0)
MCH: 22.1 pg — ABNORMAL LOW (ref 26.0–34.0)
MCHC: 31.8 g/dL (ref 30.0–36.0)
MCV: 69.4 fL — ABNORMAL LOW (ref 80.0–100.0)
Monocytes Absolute: 1.2 10*3/uL — ABNORMAL HIGH (ref 0.1–1.0)
Monocytes Relative: 10 %
Neutro Abs: 6.5 10*3/uL (ref 1.7–7.7)
Neutrophils Relative %: 58 %
Platelets: 337 10*3/uL (ref 150–400)
RBC: 5.43 MIL/uL (ref 4.22–5.81)
RDW: 15.3 % (ref 11.5–15.5)
WBC: 11.3 10*3/uL — ABNORMAL HIGH (ref 4.0–10.5)
nRBC: 0 % (ref 0.0–0.2)

## 2020-04-27 LAB — GLUCOSE, CAPILLARY
Glucose-Capillary: 110 mg/dL — ABNORMAL HIGH (ref 70–99)
Glucose-Capillary: 93 mg/dL (ref 70–99)
Glucose-Capillary: 109 mg/dL — ABNORMAL HIGH (ref 70–99)
Glucose-Capillary: 142 mg/dL — ABNORMAL HIGH (ref 70–99)

## 2020-04-27 NOTE — Progress Notes (Signed)
[facility] PHYSICAL MEDICINE & REHABILITATION PROGRESS NOTE  Subjective/Complaints: No new issues this morning. Slept well  ROS: limited due to language/communication    Objective: Vital Signs: Blood pressure 116/73, pulse 71, temperature 98.5 F (36.9 C), resp. rate 18, height 4\' 11"  (1.499 m), weight 43.1 kg, SpO2 96 %. No results found. Recent Labs    04/27/20 0440  WBC 11.3*  HGB 12.0*  HCT 37.7*  PLT 337   Recent Labs    04/26/20 0610  NA 135  K 4.6  CL 102  CO2 25  GLUCOSE 105*  BUN 21  CREATININE 1.09  CALCIUM 9.5    Intake/Output Summary (Last 24 hours) at 04/27/2020 1116 Last data filed at 04/27/2020 0505 Gross per 24 hour  Intake 90 ml  Output 1100 ml  Net -1010 ml       Physical Exam: BP 116/73 (BP Location: Right Arm)   Pulse 71   Temp 98.5 F (36.9 C)   Resp 18   Ht 4\' 11"  (1.499 m)   Wt 43.1 kg   SpO2 96%   BMI 19.19 kg/m  Constitutional: No distress . Vital signs reviewed. HEENT: EOMI, oral membranes moist Neck: supple Cardiovascular: RRR without murmur. No JVD    Respiratory/Chest: CTA Bilaterally without wheezes or rales. Normal effort    GI/Abdomen: BS +, non-tender, non-distended Ext: no clubbing, cyanosis, or edema Psych: pleasant and cooperative Musc: No edema in extremities.  No tenderness in extremities. Neuro: Alert Left facial weakness, stable Left gaze preference LUE 0/5 proximal to distal--no changes LLE: HF 1+/5, KE 3-/5, ADF 0/5--no changes No increase in tone noted  Assessment/Plan: 1. Functional deficits which require 3+ hours per day of interdisciplinary therapy in a comprehensive inpatient rehab setting.  Physiatrist is providing close team supervision and 24 hour management of active medical problems listed below.  Physiatrist and rehab team continue to assess barriers to discharge/monitor patient progress toward functional and medical goals   Care Tool:  Bathing    Body parts bathed by patient:  Face,Chest,Abdomen,Left arm,Front perineal area,Right upper leg,Left upper leg   Body parts bathed by helper: Right arm,Buttocks,Right lower leg,Left lower leg     Bathing assist Assist Level: Moderate Assistance - Patient 50 - 74%     Upper Body Dressing/Undressing Upper body dressing   What is the patient wearing?: Pull over shirt    Upper body assist Assist Level: Moderate Assistance - Patient 50 - 74%    Lower Body Dressing/Undressing Lower body dressing      What is the patient wearing?: Pants,Incontinence brief     Lower body assist Assist for lower body dressing: Maximal Assistance - Patient 25 - 49%     Toileting Toileting    Toileting assist Assist for toileting: Total Assistance - Patient < 25%     Transfers Chair/bed transfer  Transfers assist     Chair/bed transfer assist level: Moderate Assistance - Patient 50 - 74%     Locomotion Ambulation   Ambulation assist   Ambulation activity did not occur: Safety/medical concerns  Assist level: 2 helpers (w/c follow) Assistive device: Other (comment) (hand rail) Max distance: 60ft   Walk 10 feet activity   Assist  Walk 10 feet activity did not occur: Safety/medical concerns  Assist level: 2 helpers (w/c follow) Assistive device: Other (comment) (hand rail)   Walk 50 feet activity   Assist Walk 50 feet with 2 turns activity did not occur: Safety/medical concerns  Walk 150 feet activity   Assist Walk 150 feet activity did not occur: Safety/medical concerns         Walk 10 feet on uneven surface  activity   Assist Walk 10 feet on uneven surfaces activity did not occur: Safety/medical concerns         Wheelchair     Assist Will patient use wheelchair at discharge?: Yes Type of Wheelchair: Manual Wheelchair activity did not occur: Safety/medical concerns         Wheelchair 50 feet with 2 turns activity    Assist    Wheelchair 50 feet with 2 turns  activity did not occur: Safety/medical concerns       Wheelchair 150 feet activity     Assist  Wheelchair 150 feet activity did not occur: Safety/medical concerns       Medical Problem List and Plan: 1.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 Impaired mobility -DVT/anticoagulation:Continue Subcutaneous continue heparin initiated for 02/25/2020 -antiplatelet therapy: N/A 3. Pain Management:continue Tylenol as needed. D/c tramadol.  4. Mood:Provide emotional support -antipsychotic agents: N/A 5. Neuropsych: This patientis notcapable of making decisions on hisown behalf. 6. Skin/Wound Care:Routine skin checks 7. Fluids/Electrolytes/Nutrition:Routine in and outs. 8. Hypertension.   Norvasc 10 mg daily  Lopressor 50 mg twice daily.   D/c'ed clonidine.   Controlled on 4/23 9. History of open Uretlithotomy2005 in [place] as well as left PCNL and laser ureterotomyOctober 2018 followed by Dr. Marlou Porch. 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  -CONTINUE FOLEY TUBE.DO NOT REMOVE .Foley catheter to remain in place until follow-up in the office of Dr. Modena Slater for cystogram. 10. Diet controlled diabetes mellitus. Hemoglobin A1c 5.8. SSI -Educate pt/family as needed CBG (last 3)  Recent Labs    04/26/20 1628 04/26/20 2110 04/27/20 0618  GLUCAP 143* 127* 110*   Metformin decreased back to 500mg  daily and restarted CBG checks.   Fair control 4/23 11.  Leukocytosis  WBCs 11.2 on 4/19, labs ordered for Monday  Afebrile  Recheck 4/19 12.  Acute blood loss anemia  Hemoglobin 14.4 on 4/19  Recheck 4/19 13.  CKD   Creatinine 1.09 on 4/22  Encourage fluids 14.  Gout - in right  knee  Improved- now off colchicine 15.  Slow transit constipation  Bowel meds increased on 4/8  Improved 16. Lethargy: increased ritalin to 5mg  BID  Improving 17. Emesis   Appears to have resolved   Changed Lipitor to HS to spread out medications   LOS: 16 days A FACE TO FACE EVALUATION WAS PERFORMED  6/8 04/27/2020, 11:16 AM

## 2020-04-28 LAB — GLUCOSE, CAPILLARY
Glucose-Capillary: 108 mg/dL — ABNORMAL HIGH (ref 70–99)
Glucose-Capillary: 115 mg/dL — ABNORMAL HIGH (ref 70–99)
Glucose-Capillary: 155 mg/dL — ABNORMAL HIGH (ref 70–99)
Glucose-Capillary: 95 mg/dL (ref 70–99)

## 2020-04-28 NOTE — Progress Notes (Signed)
Physical Therapy Session Note  Patient Details  Name: Anthony Huber MRN: [id number] Date of Birth: 15-May-1960  Today's Date: 04/28/2020 PT Individual Time: 1000-1050 PT Individual Time Calculation (min): 50 min   Short Term Goals: Week 2:  PT Short Term Goal 1 (Week 2): Pt will complete supine<>sit with modA and use of hospital bed features PT Short Term Goal 1 - Progress (Week 2): Partly met (Performance level fluctuates) PT Short Term Goal 2 (Week 2): Pt will sit unsupported with modA x10 minutes PT Short Term Goal 2 - Progress (Week 2): Met PT Short Term Goal 3 (Week 2): Pt will perform bed<>chair transfer with maxA of 1 person PT Short Term Goal 3 - Progress (Week 2): Met  Skilled Therapeutic Interventions/Progress Updates: Pt presents supine in bed and appears agreeable to therapy.  No interpreter present and pt unable to hear electronic interpreter.  Pt transfers to right w/ mod A and total A for L extremities.  Pt sat EOB w/ mod A pushing to left, but decreases w/ time to min A.  Called son for interpreter services but only responding to pain in R knee when weight-bearing.  Pt stood multiple trials w/ Stedy and mod A w/ facilitation at L elbow and knee w/ HOH on bar. Pt c/o pain R knee and pt returned to supine via R sidelying.  Pt tolerated LLE there ex all motions w/ noted AAROM HS.  Bed alarm on and all needs in reach.     Therapy Documentation Precautions:  Restrictions Weight Bearing Restrictions: No General:   Vital Signs:   Pain: c/o R knee pain w/ son on phone as interpreter, but unable to quantify.  Nurse brought pain meds at end of rx. Pain Assessment Pain Scale: Faces Faces Pain Scale: Hurts even more Pain Type: Acute pain Pain Location: Knee Pain Orientation: Right Pain Descriptors / Indicators: Aching Pain Frequency: Intermittent Pain Onset: On-going Pain Intervention(s): Medication (See eMAR) (tylenol given) Mobility:    Therapy/Group: Individual  Therapy  Ladoris Gene 04/28/2020, 10:53 AM

## 2020-04-29 ENCOUNTER — Inpatient Hospital Stay (HOSPITAL_COMMUNITY): Payer: BC Managed Care – PPO

## 2020-04-29 LAB — GLUCOSE, CAPILLARY
Glucose-Capillary: 101 mg/dL — ABNORMAL HIGH (ref 70–99)
Glucose-Capillary: 125 mg/dL — ABNORMAL HIGH (ref 70–99)
Glucose-Capillary: 106 mg/dL — ABNORMAL HIGH (ref 70–99)

## 2020-04-29 MED ORDER — COLCHICINE 0.6 MG PO TABS
1.2000 mg | ORAL_TABLET | Freq: Once | ORAL | Status: AC
  Administered 2020-04-29: 1.2 mg via ORAL
  Filled 2020-04-29: qty 2

## 2020-04-29 MED ORDER — COLCHICINE 0.6 MG PO TABS
0.6000 mg | ORAL_TABLET | Freq: Every day | ORAL | Status: DC
  Administered 2020-04-30 – 2020-05-17 (×18): 0.6 mg via ORAL
  Filled 2020-04-29 (×18): qty 1

## 2020-04-29 NOTE — Progress Notes (Signed)
Occupational Therapy Session Note  Patient Details  Name: Anthony Huber MRN: [id number] Date of Birth: [birth year]  Today's Date: 04/29/2020 OT Individual Time: 0900-1000 OT Individual Time Calculation (min): 60 min    Short Term Goals: Week 2:  OT Short Term Goal 1 (Week 2): Pt will be able to sit EOB with mod A in prep for self care. OT Short Term Goal 1 - Progress (Week 2): Met OT Short Term Goal 2 (Week 2): Pt will demonstrate improved L side awareness with bathing UB with min a. OT Short Term Goal 2 - Progress (Week 2): Met OT Short Term Goal 3 (Week 2): Pt will demonstrate improved apraxia to don shirt with mod a. OT Short Term Goal 3 - Progress (Week 2): Met OT Short Term Goal 4 (Week 2): Pt will transfer bed>< wc with mod A of 2. OT Short Term Goal 4 - Progress (Week 2): Met  Skilled Therapeutic Interventions/Progress Updates:    Pt received in wc ready for therapy. Interpretor and wife present. Had pt work on self propelling his wc about 50% of the way to the gym with mod A. He continues to slide forward so trialed a thinner cushion with a supportive wc back. This did help somewhat but he continues to pull forward slightly out of seat when using R foot to guide chair.  Squat pivot to mat to L with mod A. Worked on sitting balance and LUE NMR with a/arom guided reaching in various planes and grasping cones with improved finger squeeze.   Pt needed significant cues to attend to L hand, decreased energy levels today but he still participated fairly well.  Pt taken back to room and encouraged to sit up in wc for awhile.  Belt alarm on, all needs met.   Therapy Documentation Precautions:  Restrictions Weight Bearing Restrictions: No    Vital Signs: Therapy Vitals Pulse Rate: 74 BP: 122/70 Pain: Pain Assessment Faces Pain Scale: Hurts little more Pain Type: Acute pain Pain Location: Knee Pain Orientation: Right Pain Descriptors / Indicators: Aching Pain Onset: On-going Pain  Intervention(s):  (premedicated, voltaren gel) ADL: ADL Grooming: Moderate assistance Upper Body Bathing: Moderate assistance Where Assessed-Upper Body Bathing: Other (Comment) (BSC) Lower Body Bathing: Moderate assistance Where Assessed-Lower Body Bathing: Other (Comment) (BSC) Upper Body Dressing: Moderate assistance Where Assessed-Upper Body Dressing:  (BSC) Lower Body Dressing: Maximal assistance Where Assessed-Lower Body Dressing: Other (Comment) (BSC) Toileting: Dependent Where Assessed-Toileting: Bedside Commode Toilet Transfer: Minimal assistance Toilet Transfer Method: Other (comment) (stedy) Toilet Transfer Equipment: Bedside commode   Therapy/Group: Individual Therapy  [place] 04/29/2020, 12:17 PM

## 2020-04-29 NOTE — Progress Notes (Signed)
Physical Therapy Session Note  Patient Details  Name: Anthony Huber MRN: [id number] Date of Birth: 1960/04/12  Today's Date: 04/29/2020 PT Individual Time: 0800-0858 PT Individual Time Calculation (min): 58 min   Short Term Goals: Week 3:  PT Short Term Goal 1 (Week 3): Pt will consistently perform bed mobility with modA PT Short Term Goal 2 (Week 3): Pt will consistently perform bed<>chair transfers wtih modA and LRAD PT Short Term Goal 3 (Week 3): Pt will perform sit<>stand transfers with modA and LRAD PT Short Term Goal 4 (Week 3): Pt will sit unsupported for >10 minutes with no more than minA  Skilled Therapeutic Interventions/Progress Updates:     Pt greeted supine in bed, wife and interpreter at bedside. Pt agreeable to therapy but reports 8/10 R (non-paretic) knee pain that he feels worsened over night. Knee observed to be positioned in flexed posture. Painful palpation to proximal tibia and patella tendon with some edema noted. Painful PROM, especially in extension. MD arriving for morning rounds and was made aware who reported he would order XR. Pt had not received medications for morning yet, RN made aware who arrived mid-session to deliver. Pt requesting to remain in bed until medications arrived. Removed LUE WHO and LLE PRAFO with totalA for time management. Wife emptied catheter with appropriate understanding and cleanliness. Retrieved E-STIM for NMES to LLE. With bolster under B knees, performed the following while supine in bed in hooklying position: -2x10 RLE SAQ with e-stim to proximal/distal quad (AAROM) -2x10 RLE knee-to-chest with e-stim to proximal quad (AAROM) -2x10 RLE ankle DF with e-stim to ant tib (AAROM).   Knee ext 3-/5, hip flex 2+/5, ankle DF 2-/5.  Educated wife on performing LLE SAQ and knee-to-chest while supine in bed outside of therapy to maximize strengthening. Educated on positioning and technique, which she verbalized appropriate understanding.  Supine<>sit  with modA with use of bed features. Squat<>pivot transfer with maxA from EOB to manual w/c - cues for technique and sequencing. Required modA for repositioning in w/c. Remained seated in w/c with needs in reach and safety belt alarm on with LUE supported with 1/2 lap tray.   Therapy Documentation Precautions:  Restrictions Weight Bearing Restrictions: No General:    Therapy/Group: Individual Therapy  Ambria Mayfield P Arielle Eber PT 04/29/2020, 7:38 AM

## 2020-04-29 NOTE — Progress Notes (Signed)
Anthony Huber PHYSICAL MEDICINE & REHABILITATION PROGRESS NOTE  Subjective/Complaints: Patient seen laying in bed this morning.  He slept well overnight.  Wife at bedside.  Interpreter at bedside.  Patient complains of right knee pain.  He is working with therapies.  Discussed orthoses with therapy  ROS: Denies CP, SOB, N/V/D  Objective: Vital Signs: Blood pressure 122/70, pulse 74, temperature 97.6 F (36.4 C), temperature source Oral, resp. rate 20, height 4\' 11"  (1.499 m), weight 43.1 kg, SpO2 98 %. No results found. Recent Labs    04/27/20 0440  WBC 11.3*  HGB 12.0*  HCT 37.7*  PLT 337   No results for input(s): NA, K, CL, CO2, GLUCOSE, BUN, CREATININE, CALCIUM in the last 72 hours.  Intake/Output Summary (Last 24 hours) at 04/29/2020 1241 Last data filed at 04/28/2020 1700 Gross per 24 hour  Intake --  Output 700 ml  Net -700 ml       Physical Exam: BP 122/70   Pulse 74   Temp 97.6 F (36.4 C) (Oral)   Resp 20   Ht 4\' 11"  (1.499 m)   Wt 43.1 kg   SpO2 98%   BMI 19.19 kg/m  Constitutional: No distress . Vital signs reviewed. HENT: Normocephalic.  Atraumatic. Eyes: EOMI. No discharge. Cardiovascular: No JVD.   Respiratory: Normal effort.  No stridor.   GI: Non-distended.   Skin: Warm and dry.  Intact. Psych: Normal mood.  Normal behavior. Musc: No edema in extremities.  No tenderness in extremities. Neuro: Alert Left facial weakness, stable Left gaze preference LUE 0/5 proximal to distal, unchanged LLE: HF 2/5, KE 3-/5, ADF 2 - /5 with apraxia No increase in tone noted  Assessment/Plan: 1. Functional deficits which require 3+ hours per day of interdisciplinary therapy in a comprehensive inpatient rehab setting.  Physiatrist is providing close team supervision and 24 hour management of active medical problems listed below.  Physiatrist and rehab team continue to assess barriers to discharge/monitor patient progress toward functional and medical  goals   Care Tool:  Bathing    Body parts bathed by patient: Face,Chest,Abdomen,Left arm,Front perineal area,Right upper leg,Left upper leg   Body parts bathed by helper: Right arm,Buttocks,Right lower leg,Left lower leg     Bathing assist Assist Level: Moderate Assistance - Patient 50 - 74%     Upper Body Dressing/Undressing Upper body dressing   What is the patient wearing?: Pull over shirt    Upper body assist Assist Level: Moderate Assistance - Patient 50 - 74%    Lower Body Dressing/Undressing Lower body dressing      What is the patient wearing?: Pants,Incontinence brief     Lower body assist Assist for lower body dressing: Maximal Assistance - Patient 25 - 49%     Toileting Toileting    Toileting assist Assist for toileting: Total Assistance - Patient < 25%     Transfers Chair/bed transfer  Transfers assist     Chair/bed transfer assist level: Moderate Assistance - Patient 50 - 74%     Locomotion Ambulation   Ambulation assist   Ambulation activity did not occur: Safety/medical concerns  Assist level: 2 helpers (w/c follow) Assistive device: Other (comment) (hand rail) Max distance: 5ft   Walk 10 feet activity   Assist  Walk 10 feet activity did not occur: Safety/medical concerns  Assist level: 2 helpers (w/c follow) Assistive device: Other (comment) (hand rail)   Walk 50 feet activity   Assist Walk 50 feet with 2 turns activity did not  occur: Safety/medical concerns         Walk 150 feet activity   Assist Walk 150 feet activity did not occur: Safety/medical concerns         Walk 10 feet on uneven surface  activity   Assist Walk 10 feet on uneven surfaces activity did not occur: Safety/medical concerns         Wheelchair     Assist Will patient use wheelchair at discharge?: Yes Type of Wheelchair: Manual Wheelchair activity did not occur: Safety/medical concerns         Wheelchair 50 feet with 2 turns  activity    Assist    Wheelchair 50 feet with 2 turns activity did not occur: Safety/medical concerns       Wheelchair 150 feet activity     Assist  Wheelchair 150 feet activity did not occur: Safety/medical concerns       Medical Problem List and Plan: 1.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 Impaired mobility -DVT/anticoagulation:Continue Subcutaneous continue heparin initiated for 02/25/2020 -antiplatelet therapy: N/A 3. Pain Management:continue Tylenol as needed. D/c tramadol.  4. Mood:Provide emotional support -antipsychotic agents: N/A 5. Neuropsych: This patientis notcapable of making decisions on hisown behalf. 6. Skin/Wound Care:Routine skin checks 7. Fluids/Electrolytes/Nutrition:Routine in and outs. 8. Hypertension.   Norvasc 10 mg daily  Lopressor 50 mg twice daily.   D/c'ed clonidine.   Controlled on 4/25 9. History of open Uretlithotomy2005 in [place] as well as left PCNL and laser ureterotomyOctober 2018 followed by Dr. Marlou Porch. 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  -CONTINUE FOLEY TUBE.DO NOT REMOVE .Foley catheter to remain in place until follow-up in the office of Dr. Modena Slater for cystogram. 10. Diet controlled diabetes mellitus. Hemoglobin A1c 5.8. SSI -Educate pt/family as needed CBG (last 3)  Recent Labs    04/28/20 2106 04/29/20 0607 04/29/20 1148  GLUCAP 95 101* 125*   Metformin decreased back to 500mg  daily and restarted CBG checks.   Relatively controlled on 4/25 11.  Leukocytosis  WBCs 11.3 on 4/23  Afebrile  Recheck 4/19 12.  Acute blood loss anemia  Hemoglobin 12.0 on 4/23  Recheck 4/19 13.  CKD   Creatinine 1.09 on 4/22  Encourage fluids 14.  Gout - in right  knee  X-ray ordered  Colchicine restarted 15.  Slow transit constipation  Bowel meds increased on 4/8  Improved 16. Lethargy: increased ritalin to 5mg  BID  Improving 17. Emesis   Appears to have resolved   Changed Lipitor to HS to spread out medications   LOS: 18 days A FACE TO FACE EVALUATION WAS PERFORMED  Anthony Huber 6/8 04/29/2020, 12:41 PM

## 2020-04-29 NOTE — Progress Notes (Signed)
Speech Language Pathology Daily Session Note  Patient Details  Name: Anthony Huber MRN: [id number] Date of Birth: Oct 23, 1960  Today's Date: 04/29/2020 SLP Individual Time: 1300-1345 SLP Individual Time Calculation (min): 45 min  Short Term Goals: Week 3: SLP Short Term Goal 1 (Week 3): Patient will initiate during conversation and functional tasks with minA cues to perform consistently SLP Short Term Goal 2 (Week 3): Patient will perform basic level functional and simulated problem solving tasks with minA to initiate and maintain attention. SLP Short Term Goal 3 (Week 3): Patient will recall and describe at least two daily events (nursing and/or therapy related) with minA cues. SLP Short Term Goal 4 (Week 3): Patient will demonstrate awareness to errors during structured task completion with modA cues.  Skilled Therapeutic Interventions:   Skilled SLP intervention focused on cognition. Family member on the phone interpreted for patient this session. Sustained attention card sorting task completed with max cues for initiation and redirection. Pt responded to questions regardign daily events with max A verbal cues choice of 2 to elaborate on responses. He demonstrated awareness of errors with sustained attention card sorting task with max A visual cues. Pt assisted with ordering meals for next 2 days using translator on phone. Cont with therapy per plan of care.   Pain Pain Assessment Pain Scale: Faces Faces Pain Scale: No hurt Pain Type: Acute pain Pain Location: Knee Pain Orientation: Right Pain Descriptors / Indicators: Aching Pain Onset: On-going Pain Intervention(s):  (premedicated, voltaren gel)  Therapy/Group: Individual Therapy  Carlean Jews Bao Bazen 04/29/2020, 1:38 PM

## 2020-04-30 LAB — GLUCOSE, CAPILLARY
Glucose-Capillary: 96 mg/dL (ref 70–99)
Glucose-Capillary: 137 mg/dL — ABNORMAL HIGH (ref 70–99)
Glucose-Capillary: 144 mg/dL — ABNORMAL HIGH (ref 70–99)
Glucose-Capillary: 113 mg/dL — ABNORMAL HIGH (ref 70–99)
Glucose-Capillary: 118 mg/dL — ABNORMAL HIGH (ref 70–99)

## 2020-04-30 NOTE — Progress Notes (Signed)
Physical Therapy Session Note  Patient Details  Name: Anthony Huber MRN: [id number] Date of Birth: [birth year]  Today's Date: 04/30/2020 PT Individual Time: 6301-6010 + 1300-1357 PT Individual Time Calculation (min): 58 min  + 57 min  Short Term Goals: Week 3:  PT Short Term Goal 1 (Week 3): Pt will consistently perform bed mobility with modA PT Short Term Goal 2 (Week 3): Pt will consistently perform bed<>chair transfers wtih modA and LRAD PT Short Term Goal 3 (Week 3): Pt will perform sit<>stand transfers with modA and LRAD PT Short Term Goal 4 (Week 3): Pt will sit unsupported for >10 minutes with no more than minA  Skilled Therapeutic Interventions/Progress Updates:     1st session: Pt greeted supine in bed with wife and intrepreter at bedside - pt agreeable to PT session. Reports moderate R knee pain, had not received morning medications yet. Rest breaks and repositioning provided as needed during session for pain management. Supine<>sit with modA with hospital bed features, HOB ~20deg elevated. Improved initial sitting balance requiring minA for steadying. Squat<>pivot transfer requiring maxA from EOB to manual w/c. Requires minA for repositioning in w/c but mod cues. W/c transport for time management to main rehab gym.  Squat<>pivot with modA from w/c to mat table, cues for hand placement, sequencing, and technique. Worked on static and dynamic sitting balance while using mirror for visual feedback. Performed targeted reaching with cones with RUE including across midline, forward, overhead, and ipsilateral reaching. Pt with frequent LOB to the L during this activity, requiring totalA for recovery due to delayed protective responses.   Pt reporting feeling "dizzy" and that his "head is turning." Attempted to differentiate b/w dizziness and lightheadedness, pt denies lightheadedness. BP assessed reading 126/84, HR 99. RN notified who arrived to deliver morning mediations. MD also present during  morning rounds who was made aware. Pt reports dizziness starts once he sat on mat table. No nystagmus noted during   Pt performed x2 sit<>stands with max/totalA +2 with L knee block and assist for truncal extension and midline awareness. Unable to adequately achieve full standing and deferred further trials for safety concerns.   Squat<>pivot with modA back to his w/c and returned to his room with totalA for time management. Pt remained seated in w/c with safety belt alarm on and needs in reach. LUE supported with 1/2 lap tray.   2nd session: Pt greeted supine in bed, wife finishing up feeding him lunch. Pt agreeable to therapy session. Wife also emptied catheter and discarded appropriately. In person interpreter at bedside. 1st part of session focused on DC planning. Lengthy discussion with wife educated her on pt's current deficits, his progress, and general stroke recovery. Also discussed barriers to DC home and concern for her ability to provide amount of assist necessary for functional transfers and self care tasks. Wife reports her son and daughter in law live with them and there will be 3 days of the week where she is by herself. Wife called son to schedule family ed this weekend so he can witness and get hands on training as appropriate.   Bed mobility completed with modA with hospital bed features. Sit<>stand in stedy with minA needing cues for hand placement and upright posture. Able to sit in perched position on Stedy with CGA and transferred to w/c. W/c transport for time management to day room gym. Squat<>pivot transfer with modA from w/c to Nustep. Used WellGrip for LUE to handle and at workload of 5, pt completed 6 minutes (  intermittent rest breaks) while using both BUE and BLE. Pt needing minA for L knee control for proper alignment. ModA squat pivot transfer back to his w/c and returned to his room with ottalA for time management. Completed Stedy transfer in similar fashion as above from  w/c to bed. Needing modA for sit>supine for BLE and trunk control. Remained supine in bed with needs in reach and bed alarm on.    Therapy Documentation Precautions:  Restrictions Weight Bearing Restrictions: No General:    Therapy/Group: Individual Therapy  [name] PT 04/30/2020, 7:34 AM

## 2020-04-30 NOTE — Progress Notes (Signed)
[facility] PHYSICAL MEDICINE & REHABILITATION PROGRESS NOTE  Subjective/Complaints: Patient seen sitting up, working with therapy this morning.  Noted to be dizzy, discussed with therapies as well as nursing.  Wife and interpreter at bedside.  Patient slept well overnight.  Fluid intake noted to be minimal.  Encourage fluid intake.  Discussed with therapies.  ROS: + Dizziness.  Denies CP, SOB, N/V/D  Objective: Vital Signs: Blood pressure 126/84, pulse 98, temperature 98.1 F (36.7 C), temperature source Oral, resp. rate 20, height 4\' 11"  (1.499 m), weight 45.1 kg, SpO2 98 %. DG Knee Complete 4 Views Right  Result Date: 04/29/2020 CLINICAL DATA:  Right knee pain and immobility for unknown period of time. EXAM: RIGHT KNEE - COMPLETE 4+ VIEW COMPARISON:  None. FINDINGS: Normal alignment and joint spaces. Trace peripheral spurring of the patellofemoral compartment. Probable subchondral cystic change in the patellofemoral compartment. Small joint effusion. No erosion or bone destruction. No fracture. IMPRESSION: Mild osteoarthritis of the patellofemoral compartment with small joint effusion. Electronically Signed   By: 05/01/2020 M.D.   On: 04/29/2020 16:33   No results for input(s): WBC, HGB, HCT, PLT in the last 72 hours. No results for input(s): NA, K, CL, CO2, GLUCOSE, BUN, CREATININE, CALCIUM in the last 72 hours.  Intake/Output Summary (Last 24 hours) at 04/30/2020 1232 Last data filed at 04/30/2020 0741 Gross per 24 hour  Intake 240 ml  Output 1300 ml  Net -1060 ml       Physical Exam: BP 126/84 (BP Location: Right Arm)   Pulse 98   Temp 98.1 F (36.7 C) (Oral)   Resp 20   Ht 4\' 11"  (1.499 m)   Wt 45.1 kg   SpO2 98%   BMI 20.08 kg/m  Constitutional: No distress . Vital signs reviewed. HENT: Normocephalic.  Atraumatic. Eyes: EOMI. No discharge. Cardiovascular: No JVD.  RRR. Respiratory: Normal effort.  No stridor.  Bilateral clear to auscultation. GI: Non-distended.   BS +. Skin: Warm and dry.  Intact. Psych: Normal mood.  Normal behavior. Musc: No edema in extremities.  No tenderness in extremities. Neuro: Alert Left facial weakness, stable Left gaze preference, unchanged LUE 0/5 proximal to distal, stable LLE: HF 2/5, KE 3-/5, ADF 2 - /5 with apraxia No increase in tone noted  Assessment/Plan: 1. Functional deficits which require 3+ hours per day of interdisciplinary therapy in a comprehensive inpatient rehab setting.  Physiatrist is providing close team supervision and 24 hour management of active medical problems listed below.  Physiatrist and rehab team continue to assess barriers to discharge/monitor patient progress toward functional and medical goals   Care Tool:  Bathing    Body parts bathed by patient: Face,Chest,Abdomen,Left arm,Front perineal area,Right upper leg,Left upper leg   Body parts bathed by helper: Right arm,Buttocks,Right lower leg,Left lower leg     Bathing assist Assist Level: Moderate Assistance - Patient 50 - 74%     Upper Body Dressing/Undressing Upper body dressing   What is the patient wearing?: Pull over shirt    Upper body assist Assist Level: Moderate Assistance - Patient 50 - 74%    Lower Body Dressing/Undressing Lower body dressing      What is the patient wearing?: Pants,Incontinence brief     Lower body assist Assist for lower body dressing: Maximal Assistance - Patient 25 - 49%     Toileting Toileting    Toileting assist Assist for toileting: Total Assistance - Patient < 25%     Transfers Chair/bed transfer  Transfers assist     Chair/bed transfer assist level: Moderate Assistance - Patient 50 - 74%     Locomotion Ambulation   Ambulation assist   Ambulation activity did not occur: Safety/medical concerns  Assist level: 2 helpers (w/c follow) Assistive device: Other (comment) (hand rail) Max distance: 70ft   Walk 10 feet activity   Assist  Walk 10 feet activity did  not occur: Safety/medical concerns  Assist level: 2 helpers (w/c follow) Assistive device: Other (comment) (hand rail)   Walk 50 feet activity   Assist Walk 50 feet with 2 turns activity did not occur: Safety/medical concerns         Walk 150 feet activity   Assist Walk 150 feet activity did not occur: Safety/medical concerns         Walk 10 feet on uneven surface  activity   Assist Walk 10 feet on uneven surfaces activity did not occur: Safety/medical concerns         Wheelchair     Assist Will patient use wheelchair at discharge?: Yes Type of Wheelchair: Manual Wheelchair activity did not occur: Safety/medical concerns         Wheelchair 50 feet with 2 turns activity    Assist    Wheelchair 50 feet with 2 turns activity did not occur: Safety/medical concerns       Wheelchair 150 feet activity     Assist  Wheelchair 150 feet activity did not occur: Safety/medical concerns       Medical Problem List and Plan: 1.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 Impaired mobility -DVT/anticoagulation:Continue Subcutaneous continue heparin initiated for 02/25/2020 -antiplatelet therapy: N/A 3. Pain Management:continue Tylenol as needed. D/c tramadol.  4. Mood:Provide emotional support -antipsychotic agents: N/A 5. Neuropsych: This patientis notcapable of making decisions on hisown behalf. 6. Skin/Wound Care:Routine skin checks 7. Fluids/Electrolytes/Nutrition:Routine in and outs. 8. Hypertension.   Norvasc 10 mg daily  Lopressor 50 mg twice daily.   D/c'ed clonidine.   Controlled on 4/26 9. History of open [place]lith[place] in [place] as well as left PCNL and laser ureterotomyOctober 2018 followed by Dr. Marlou Porch. 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  -CONTINUE FOLEY TUBE.DO NOT REMOVE .Foley catheter to remain in place until follow-up in the office of Dr. Modena Slater for cystogram. 10. Diet controlled diabetes mellitus. Hemoglobin A1c 5.8. SSI -Educate pt/family as needed CBG (last 3)  Recent Labs    04/30/20 0627 04/30/20 0849 04/30/20 1129  GLUCAP 96 137* 144*   Metformin decreased back to 500mg  daily and restarted CBG checks.   Slightly labile on 4/26 11.  Leukocytosis  WBCs 11.3 on 4/23, labs ordered for tomorrow  Afebrile  Recheck 4/19 12.  Acute blood loss anemia  Hemoglobin 12.0 on 4/23  Recheck 4/19 13.  CKD   Creatinine 1.09 on 4/22, labs ordered for tomorrow  Encourage fluids, discussed again 14.  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  Slow transit constipation  Bowel meds increased on 4/8  Improved 16. Lethargy: increased ritalin to 5mg  BID  Improving 17. Emesis   Appears to have resolved   Changed Lipitor to HS to spread out medications   LOS: 19 days A FACE TO FACE EVALUATION WAS PERFORMED  Grayling Schranz 6/8 04/30/2020, 12:32 PM

## 2020-04-30 NOTE — Progress Notes (Signed)
   04/30/20 0842  Vitals  BP 126/84  MAP (mmHg) 98  BP Location Right Arm  BP Method Automatic  Patient Position (if appropriate) Sitting  Pulse Rate 98  Pulse Rate Source Dinamap  ECG Heart Rate 98  Resp 20  Level of Consciousness  Level of Consciousness Alert  Oxygen Therapy  SpO2 98 %  O2 Device Room Air  Patient Activity (if Appropriate) Other (Comment) (sitting)   Patient complaining of dizziness while in therapy.  States, "when I look at the door, it is moving".  CBG is WNL.   Dr. Allena Katz in attendance.  Advises to push fluids.

## 2020-04-30 NOTE — Progress Notes (Signed)
Occupational Therapy Session Note  Patient Details  Name: Eland Lamantia MRN: [id number] Date of Birth: [birth year]  Today's Date: 04/30/2020 OT Individual Time: 0900-1000 OT Individual Time Calculation (min): 60 min    Short Term Goals: Week 3:  OT Short Term Goal 1 (Week 3): Pt will complete sq pivot transfer from wc >< BSC with mod A of 1. OT Short Term Goal 2 (Week 3): Pt will be able to sit to stand with min A of 1 and hold balance with mod A to prep for clothing management. OT Short Term Goal 3 (Week 3): Pt will be able to don a shirt with min A.  Skilled Therapeutic Interventions/Progress Updates:    Pt received in wc and taken to gym.  Mod A transfer. For first 30 min worked on LUE wt bearing with R hand reaching tasks focusing on visual tracking, balance.  He has a significant field cut and needs to turn his head to the L fully to see objects on his L.  He did fairly well holding his balance when reaching to the R or forward but easily lost it reaching left. Focused on pt's protective responses and cued him to pull up himself which he did after each cue.    Pt c/o dizziness, moved to supine for LUE NMR with a/arom and bimanual bar  Work with L hand cobaned to bar.  Continues to only have trace to minimal movements in his arm and needs cues to initiate his movement.    Transferred back to wc with mod A and then used stedy to get back to bed.  Max A to stand up in stedy.  Pt not initiating well.  Pt resting in bed with all needs met.   Therapy Documentation Precautions:  Restrictions Weight Bearing Restrictions: No    Vital Signs: Therapy Vitals Temp: 98.1 F (36.7 C) Temp Source: Oral Pulse Rate: (P) 98 Resp: (P) 20 BP: (P) 126/84 Patient Position (if appropriate): (P) Sitting Oxygen Therapy SpO2: (P) 98 % O2 Device: (P) Room Air Patient Activity (if Appropriate): (P) Other (Comment) (sitting) Pain:   ADL: ADL Grooming: Moderate assistance Upper Body Bathing: Moderate  assistance Where Assessed-Upper Body Bathing: Other (Comment) (BSC) Lower Body Bathing: Moderate assistance Where Assessed-Lower Body Bathing: Other (Comment) (BSC) Upper Body Dressing: Moderate assistance Where Assessed-Upper Body Dressing:  (BSC) Lower Body Dressing: Maximal assistance Where Assessed-Lower Body Dressing: Other (Comment) (BSC) Toileting: Dependent Where Assessed-Toileting: Bedside Commode Toilet Transfer: Minimal assistance Toilet Transfer Method: Other (comment) (stedy) Toilet Transfer Equipment: Bedside commode   Therapy/Group: Individual Therapy  N[place]itt 04/30/2020, 8:46 AM

## 2020-04-30 NOTE — Progress Notes (Signed)
Nutrition Follow-up  DOCUMENTATION CODES:   Not applicable  INTERVENTION:   - Continue Glucerna Shake po TID, each supplement provides 220 kcal and 10 grams of protein  - Magic Cup BID with meals, each supplement provides 290 kcal and 9 grams of protein  - Continue MVI with minerals daily  - Encourage adequate PO intake  NUTRITION DIAGNOSIS:   Increased nutrient needs related to chronic illness (CAD, DM) as evidenced by estimated needs.  Ongoing, being addressed via supplements  GOAL:   Patient will meet greater than or equal to 90% of their needs  Progressing  MONITOR:   PO intake,Supplement acceptance,Labs,Weight trends,Skin,I & O's  REASON FOR ASSESSMENT:   Consult Assessment of nutrition requirement/status  ASSESSMENT:   Anthony Huber is a 60 year old right-handed male with history of open ureterlithotomy 2005 in [place] as well as left PCNL and laser ureterotomy October 2018 per Dr. Marlou Porch, CAD, diabetes mellitus and hypertension with remote tobacco use.  Per notes, pt with poor fluid intake. PO intake at meals has improved with average of last 8 recorded meals being 93%. Pt accepting most Glucerna shakes per [facility] documentation.  Reviewed weights. Weight on 4/07 documented as 48.9 kg. Weight on 4/21 was 43.1 kg and weight this AM was 45.1 kg. If weights are accurate, pt has experienced a 3.8 kg weight loss since 4/07. This is a 7.8% weight loss in less than 1 month which is severe and significant for timeframe. Given weight loss, RD to add Magic Cups to lunch and dinner meal trays to aid pt in meeting kcal and protein needs.  Meal Completion: 75-100%  Medications reviewed and include: Glucerna TID, metformin, ritalin, MVI with minerals, protonix, miralax, senna  Labs reviewed. CBG's: 96-144 x 24 hours  UOP: 1300 ml x 24 hours  Diet Order:   Diet Order            Diet Carb Modified Fluid consistency: Thin; Room service appropriate? Yes with Assist  Diet  effective now                 EDUCATION NEEDS:   Education needs have been addressed  Skin:  Skin Assessment: Reviewed RN Assessment  Last BM:  04/29/20  Height:   Ht Readings from Last 1 Encounters:  04/11/20 4\' 11"  (1.499 m)    Weight:   Wt Readings from Last 1 Encounters:  04/30/20 45.1 kg    Ideal Body Weight:  44.5 kg  BMI:  Body mass index is 20.08 kg/m.  Estimated Nutritional Needs:   Kcal:  1450-1650  Protein:  65-80 grams  Fluid:  > 1.4 L    05/02/20, MS, RD, LDN Inpatient Clinical Dietitian Please see [facility] for contact information.

## 2020-05-01 LAB — CBC WITH DIFFERENTIAL/PLATELET
Abs Immature Granulocytes: 0.03 10*3/uL (ref 0.00–0.07)
Basophils Absolute: 0.1 10*3/uL (ref 0.0–0.1)
Basophils Relative: 1 %
Eosinophils Absolute: 2.4 10*3/uL — ABNORMAL HIGH (ref 0.0–0.5)
Eosinophils Relative: 23 %
HCT: 38.8 % — ABNORMAL LOW (ref 39.0–52.0)
Hemoglobin: 12.1 g/dL — ABNORMAL LOW (ref 13.0–17.0)
Immature Granulocytes: 0 %
Lymphocytes Relative: 18 %
Lymphs Abs: 1.9 10*3/uL (ref 0.7–4.0)
MCH: 21.8 pg — ABNORMAL LOW (ref 26.0–34.0)
MCHC: 31.2 g/dL (ref 30.0–36.0)
MCV: 69.8 fL — ABNORMAL LOW (ref 80.0–100.0)
Monocytes Absolute: 1 10*3/uL (ref 0.1–1.0)
Monocytes Relative: 10 %
Neutro Abs: 5.2 10*3/uL (ref 1.7–7.7)
Neutrophils Relative %: 48 %
Platelets: 340 10*3/uL (ref 150–400)
RBC: 5.56 MIL/uL (ref 4.22–5.81)
RDW: 15 % (ref 11.5–15.5)
WBC: 10.7 10*3/uL — ABNORMAL HIGH (ref 4.0–10.5)
nRBC: 0 % (ref 0.0–0.2)

## 2020-05-01 LAB — BASIC METABOLIC PANEL
Anion gap: 6 (ref 5–15)
BUN: 21 mg/dL (ref 8–23)
CO2: 27 mmol/L (ref 22–32)
Calcium: 9.4 mg/dL (ref 8.9–10.3)
Chloride: 103 mmol/L (ref 98–111)
Creatinine, Ser: 1.07 mg/dL (ref 0.61–1.24)
GFR, Estimated: >60 mL/min (ref >60)
Glucose, Bld: 97 mg/dL (ref 70–99)
Potassium: 5 mmol/L (ref 3.5–5.1)
Sodium: 136 mmol/L (ref 135–145)

## 2020-05-01 LAB — GLUCOSE, CAPILLARY
Glucose-Capillary: 105 mg/dL — ABNORMAL HIGH (ref 70–99)
Glucose-Capillary: 167 mg/dL — ABNORMAL HIGH (ref 70–99)
Glucose-Capillary: 123 mg/dL — ABNORMAL HIGH (ref 70–99)
Glucose-Capillary: 115 mg/dL — ABNORMAL HIGH (ref 70–99)

## 2020-05-01 LAB — PATHOLOGIST SMEAR REVIEW

## 2020-05-01 NOTE — Progress Notes (Signed)
Physical Therapy Session Note  Patient Details  Name: Anthony Huber MRN: [id number] Date of Birth: 06/21/1960  Today's Date: 05/01/2020 PT Individual Time: 2878-6767 + 1310-1400 PT Individual Time Calculation (min): 30 min  + 50 min  Short Term Goals: Week 3:  PT Short Term Goal 1 (Week 3): Pt will consistently perform bed mobility with modA PT Short Term Goal 2 (Week 3): Pt will consistently perform bed<>chair transfers wtih modA and LRAD PT Short Term Goal 3 (Week 3): Pt will perform sit<>stand transfers with modA and LRAD PT Short Term Goal 4 (Week 3): Pt will sit unsupported for >10 minutes with no more than minA  Skilled Therapeutic Interventions/Progress Updates:     1st session: Handoff of care from OT at start of session with patient sitting with SBA, unsupported at mat table (feet supported with 4inch platform). Pt denies dizziness or pain.   Squat<>pivot transfer with modA from mat table to w/c, cues for sequencing and L knee block. Pt able to reposition himself in w/c with minA and cues.   Wheeled in // bars and focused remainder of session on NMR for standing balance/posture and pre-gait training. Able to sit<>stand with minA in // bar with L knee block. Max cues needed to reduce L lateral lean and reduce forward flexed trunk. Once this was achieved, performed pre-gait training with L foot to target, needing modA for balance and minA for managing LLE. Performance fluctuated depending postural ability. Wife assisting in holding LUE to // bar to maintain grasp.   Wheeled back to room with totalA for time management. Squat<>pivot transfer with modA and L knee block from w/c to EOB. Cues for "hooking" his LLE with RLE during sit>supine transition to improve indep. Pt needed modA for BLE management and had difficulty motor planning the compensatory strategy. Pt able to bridge in bed with assist for stabilizing LLE in hooklying position.   Pt remained supine in bed with bed alarm on and  needs within reach. Wife and interpreter at bedside.   2nd session:  Pt greeted supine in bed at start of session. Wife and interpreter at bedside. Pt agreeable to PT session. Supine<>sit with modA and able to sit EOB initially with CGA (!). Squat<>pivot transfer with modA from EOB to manual w/c and able to reposition with minA. Donned tennis shoes with totalA for time management and removed cushion in w/c to allow pt to sit lower to the ground in order to work on hemi w/c mobility. Pt propelled himself ~270ft + ~289ft, fluctuating b/w supervision assist and minA as pt had difficulty maintaining straight path and would veer L to R. Pt with L field cut in L eye and needed max cues for avoiding obstacles on L side such as the door frame. He also used his LLE > LUE for propulsion.   Performed stand<>step transfer with modA from w/c to mat table. Completed repeated sit<>stand with minA and L knee block with cues for placing his R hand on therapist shoulder to encourage forward weight shift. Pt continues to require modA for standing balance due to L lateral lean and difficulty correcting to midline. With +2 assist for standing with L knee block, performed bean bag toss to target, focusing on L weight shift and dynamic standing balance. Donned GRAFO to LLE and explained to pt benefits and purpose. Attempted gait training in hallway for NMR with R hand rail and +2 assist for w/c follow. Pt able to ambulate ~38ft + ~40ft. Pt unable to  achieve adequate thoracic extension and safe positioning of LLE. Pt also being mildly impulsive with attempting to step before therapist instruction. Due to safety concerns, unable to continue NMR gait training.  Pt wheeled back to his room in w/c. Stand<>pivot with modA from w/c to EOB. Sit>supine with minA for LLE management. Supine in bed with needs in reach and bed alarm on, wife at bedside.   Therapy Documentation Precautions:  Restrictions Weight Bearing Restrictions:  No General:    Therapy/Group: Individual Therapy  Gershom Brobeck P Cuinn Westerhold PT 05/01/2020, 7:36 AM

## 2020-05-01 NOTE — Patient Care Conference (Signed)
Inpatient RehabilitationTeam Conference and Plan of Care Update Date: 05/01/2020   Time: 11:17 AM    Patient Name: Anthony Huber      Medical Record Number: [id number]  Date of Birth: [birth year] Sex: Male         Room/Bed: 4W20C/4W20C-01 Payor Info: Payor: BLUE CROSS BLUE [employer]IELD / Plan: BCBS COMM PPO / Product Type: *No Product type* /    Admit Date/Time:  04/11/2020  2:09 PM  Primary Diagnosis:  ICH (intracerebral hemorrhage) [facility])  Hospital Problems: Principal Problem:   ICH (intracerebral hemorrhage) (HCC) Active Problems:   Slow transit constipation   Chronic gout of right knee   Acute blood loss anemia   Leukocytosis   Controlled type 2 diabetes mellitus with hyperglycemia, without long-term current use of insulin (HCC)   Essential hypertension   AKI (acute kidney injury) (HCC)   Benign essential HTN    Expected Discharge Date: Expected Discharge Date: 05/17/20  Team Members Present: Physician leading conference: Dr. Maryla Morrow Care Coodinator Present: Chana Bode, RN, BSN, CRRN;Becky Dupree, LCSW Nurse Present: Chana Bode, RN PT Present: Wynelle Link, PT OT Present: Primitivo Gauze, OT SLP Present: Elio Forget, SLP PPS Coordinator present : Edson Snowball, PT     Current Status/Progress Goal Weekly Team Focus  Bowel/Bladder   foley in place. continent of bowel. LBM 4/24  Remain continent bowel, maintain foley- no s/s of infection  foley care, assess bowel and bladder needs qshift and PRN   Swallow/Nutrition/ Hydration             ADL's   mod -max self care, limited progress this last week but is starting to be able to sit on BSC  min - mod A overall  LUE NMR, balance, functional mobility, pt/family education   Mobility   modA bed mobility. Heavy modA squat<>pivot transfers. Sitting balance CGA/minA. Ambulated 41ft with maxA +2 with R hand rail.  modA overall. Will likely be w/c level  Sitting balance, functional transfers, e-stim for LLE NMR, pt education.  Need to begin family training to prep for DC.   Communication             Safety/Cognition/ Behavioral Observations            Pain   No c/o pain. Voltaren gel scheduled  less than 3 out of 10  assess pain qshift and PRN   Skin   no skin impairments  remain free of breakdown  assess skin qshift and PRN     Discharge Planning:  Wife continues to be here daily participates in therapies, pt making progress and hopefully will get to a one person level-so wife can manage. Son voiced can;t come in for therapies due to works. offered weekend therapy   Team Discussion: Slow steady gradual progress noted. MD monitoring kidney function and encourage fluids. Staff note son has limited awareness of patient's deficit/care needs. Wife with patient and attending therapy sessions.  Patient on target to meet rehab goals: yes, currently mod assist for wheelchair mobility and mod assst for stand ivot transfers, stands with a stedy and  min assist. Continent on a BSC.  *See Care Plan and progress notes for long and short-term goals.   Revisions to Treatment Plan:  Education to correct left lean Neuromuscular re-education  Teaching Needs: Transfers, toileting, foley care, medications, secondary stroke risk management, etc.   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  Barriers to Discharge: Medical stability;Other (comments)  Barriers to Discharge Comments: Foley, gout Possible Resolutions to [employer] Focus: Therapies, follow labs - WBCs, Hb, Cr, knee pain   Continued Need for Acute Rehabilitation Level of Care: The patient requires daily medical management by a physician  with specialized training in physical medicine and rehabilitation for the following reasons: Direction of a multidisciplinary physical rehabilitation program to maximize functional independence : Yes Medical management of patient stability for increased activity during participation in an intensive rehabilitation regime.: Yes Analysis of laboratory values and/or radiology reports with any subsequent need for medication adjustment and/or medical intervention. : Yes   I attest that I was present, lead the team conference, and concur with the assessment and plan of the team.   Chana Bode B 05/01/2020, 3:01 PM

## 2020-05-01 NOTE — Progress Notes (Signed)
Speech Language Pathology Daily Session Note  Patient Details  Name: Anthony Huber MRN: [id number] Date of Birth: March 04, 1960  Today's Date: 05/01/2020 SLP Individual Time: 0800-0830 SLP Individual Time Calculation (min): 30 min  Short Term Goals: Week 3: SLP Short Term Goal 1 (Week 3): Patient will initiate during conversation and functional tasks with minA cues to perform consistently SLP Short Term Goal 2 (Week 3): Patient will perform basic level functional and simulated problem solving tasks with minA to initiate and maintain attention. SLP Short Term Goal 3 (Week 3): Patient will recall and describe at least two daily events (nursing and/or therapy related) with minA cues. SLP Short Term Goal 4 (Week 3): Patient will demonstrate awareness to errors during structured task completion with modA cues.  Skilled Therapeutic Interventions:   Patient seen with wife and live interpreter present in room. He was awake and alert, lying in bed when SLP arrived. Per patient report and confirmed when looking at meal try that he was finished with, he ate a very small amount of breakfast. Patient had eyeglasses in room and his wife put them on him at [facility] request. He did report that they helped with his vision. He continues to have a strong left gaze preference and did not attempt to make eye contact with SLP who was standing on right side. He required modA cues to describe action photos for 2-3 word responses. He required modA cues to elaborate on details, offering very minimal amount of new information. SLP then set patient up with pen and blank sheet of paper. He was able to write his name and current address, with only errors being adding an extra '0' in house number and substituting 'H' for 'S' ("[place]" instead of "[place]"). Accuracy was from chart review of patient's reported address as his wife is not able to read or write. SLP then wrote out his complete address and he proceeded to seem to  perseverate on looking at it and required modA cues to redirect attention. Patient continues to benefit from skilled SLP intervention to maximize cognitive-linguistic function prior to discharge.  Pain Pain Assessment Pain Scale: 0-10 Pain Score: 0-No pain  Therapy/Group: Individual Therapy  Angela Nevin, MA, CCC-SLP Speech Therapy

## 2020-05-01 NOTE — Progress Notes (Signed)
Patient ID: Anthony Huber, male   DOB: 10-31-1960, 60 y.o.   MRN: [id number]  Spoke with son via telephone to inform of team conference progress this week and discharge still 5/13. Son scheduled to come in this Sat to attend therapies with pt/dad to see his current level and how much care he is requiring. Wife is here daily and participates in therapies with husband. Continue to work on discharge needs.

## 2020-05-01 NOTE — Progress Notes (Signed)
[place] PHYSICAL MEDICINE & REHABILITATION PROGRESS NOTE  Subjective/Complaints: Patient seen laying in bed this AM.  He is working with therapies.  No interpretor present.  He appears to have slept well overnight, no issues reported.  Knee pain and dizziness appear to have improved.   ROS: Denies CP, SOB, N/V/D  Objective: Vital Signs: Blood pressure 135/83, pulse 84, temperature 98 F (36.7 C), resp. rate 18, height 4\' 11"  (1.499 m), weight 44.9 kg, SpO2 96 %. DG Knee Complete 4 Views Right  Result Date: 04/29/2020 CLINICAL DATA:  Right knee pain and immobility for unknown period of time. EXAM: RIGHT KNEE - COMPLETE 4+ VIEW COMPARISON:  None. FINDINGS: Normal alignment and joint spaces. Trace peripheral spurring of the patellofemoral compartment. Probable subchondral cystic change in the patellofemoral compartment. Small joint effusion. No erosion or bone destruction. No fracture. IMPRESSION: Mild osteoarthritis of the patellofemoral compartment with small joint effusion. Electronically Signed   By: 05/01/2020 M.D.   On: 04/29/2020 16:33   Recent Labs    05/01/20 0507  WBC 10.7*  HGB 12.1*  HCT 38.8*  PLT 340   Recent Labs    05/01/20 0507  NA 136  K 5.0  CL 103  CO2 27  GLUCOSE 97  BUN 21  CREATININE 1.07  CALCIUM 9.4    Intake/Output Summary (Last 24 hours) at 05/01/2020 1119 Last data filed at 05/01/2020 0720 Gross per 24 hour  Intake 120 ml  Output 500 ml  Net -380 ml       Physical Exam: BP 135/83 (BP Location: Right Arm)   Pulse 84   Temp 98 F (36.7 C)   Resp 18   Ht 4\' 11"  (1.499 m)   Wt 44.9 kg   SpO2 96%   BMI 19.99 kg/m  Constitutional: No distress . Vital signs reviewed. HENT: Normocephalic.  Atraumatic. Eyes: EOMI. No discharge. Cardiovascular: No JVD.  RRR. Respiratory: Normal effort.  No stridor.  Bilateral clear to auscultation. GI: Non-distended.  BS +. Skin: Warm and dry.  Intact. Psych: Normal mood.  Normal behavior. Musc:  Right knee with improving edema and tenderness Neuro: Alert Left facial weakness, unchanged Left gaze preference, unchanged LUE 0/5 proximal to distal, unchanged LLE: HF 2/5, KE 3-/5, ADF 2 - /5 with apraxia, stable No increase in tone noted  Assessment/Plan: 1. Functional deficits which require 3+ hours per day of interdisciplinary therapy in a comprehensive inpatient rehab setting.  Physiatrist is providing close team supervision and 24 hour management of active medical problems listed below.  Physiatrist and rehab team continue to assess barriers to discharge/monitor patient progress toward functional and medical goals   Care Tool:  Bathing    Body parts bathed by patient: Face,Chest,Abdomen,Left arm,Front perineal area,Right upper leg,Left upper leg   Body parts bathed by helper: Right arm,Buttocks,Right lower leg,Left lower leg     Bathing assist Assist Level: Moderate Assistance - Patient 50 - 74%     Upper Body Dressing/Undressing Upper body dressing   What is the patient wearing?: Pull over shirt    Upper body assist Assist Level: Moderate Assistance - Patient 50 - 74%    Lower Body Dressing/Undressing Lower body dressing      What is the patient wearing?: Pants,Incontinence brief     Lower body assist Assist for lower body dressing: Maximal Assistance - Patient 25 - 49%     Toileting Toileting    Toileting assist Assist for toileting: Total Assistance - Patient < 25%  Transfers Chair/bed transfer  Transfers assist     Chair/bed transfer assist level: Moderate Assistance - Patient 50 - 74%     Locomotion Ambulation   Ambulation assist   Ambulation activity did not occur: Safety/medical concerns  Assist level: 2 helpers (w/c follow) Assistive device: Other (comment) (hand rail) Max distance: 1ft   Walk 10 feet activity   Assist  Walk 10 feet activity did not occur: Safety/medical concerns  Assist level: 2 helpers (w/c  follow) Assistive device: Other (comment) (hand rail)   Walk 50 feet activity   Assist Walk 50 feet with 2 turns activity did not occur: Safety/medical concerns         Walk 150 feet activity   Assist Walk 150 feet activity did not occur: Safety/medical concerns         Walk 10 feet on uneven surface  activity   Assist Walk 10 feet on uneven surfaces activity did not occur: Safety/medical concerns         Wheelchair     Assist Will patient use wheelchair at discharge?: Yes Type of Wheelchair: Manual Wheelchair activity did not occur: Safety/medical concerns         Wheelchair 50 feet with 2 turns activity    Assist    Wheelchair 50 feet with 2 turns activity did not occur: Safety/medical concerns       Wheelchair 150 feet activity     Assist  Wheelchair 150 feet activity did not occur: Safety/medical concerns       Medical Problem List and Plan: 1.Left hemiparesissecondary to right basal ganglia intraparenchymal hemorrhage secondary to hypertensive crisis  Continue CIR  WHO/PRAFO nightly  Team conference today to discuss current and goals and coordination of care, home and environmental barriers, and discharge planning with nursing, case manager, and therapies. Please see conference note from today as well.  2. Impaired mobility -DVT/anticoagulation:Continue Subcutaneous continue heparin initiated for 02/25/2020 -antiplatelet therapy: N/A 3. Pain Management:continue Tylenol as needed. D/c tramadol.  4. Mood:Provide emotional support -antipsychotic agents: N/A 5. Neuropsych: This patientis notcapable of making decisions on hisown behalf. 6. Skin/Wound Care:Routine skin checks 7. Fluids/Electrolytes/Nutrition:Routine in and outs. 8. Hypertension.   Norvasc 10 mg daily  Lopressor 50 mg twice daily.   D/c'ed clonidine.   Controlled on 4/27 9. History of open Uretlithotomy2005 in [place] as well as  left PCNL and laser ureterotomyOctober 2018 followed by Dr. Marlou Porch. 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  -CONTINUE FOLEY TUBE.DO NOT REMOVE .Foley catheter to remain in place until follow-up in the office of Dr. Modena Slater for cystogram. 10. Diet controlled diabetes mellitus. Hemoglobin A1c 5.8. SSI -Educate pt/family as needed CBG (last 3)  Recent Labs    04/30/20 1652 04/30/20 2102 05/01/20 0607  GLUCAP 113* 118* 105*   Metformin decreased back to 500mg  daily and restarted CBG checks.   Mildly elevated on 4/27 11.  Leukocytosis  WBCs 10.7 on 4/27  Afebrile  Recheck 4/19 12.  Acute blood loss anemia  Hemoglobin 12.1 on 4/27  Recheck 4/19 13.  CKD   Creatinine 1.07 on 4/27  Encourage fluids, discussed again 14.  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  Slow transit constipation  Bowel meds increased on 4/8  Improved 16. Lethargy: increased ritalin to 5mg  BID  Improving 17. Emesis   Appears to have resolved  Changed Lipitor to HS to spread out medications   LOS: 20 days A FACE TO FACE EVALUATION WAS PERFORMED  Mattilyn Crites Karis Juba 05/01/2020, 11:19 AM

## 2020-05-01 NOTE — Progress Notes (Signed)
Occupational Therapy Session Note  Patient Details  Name: Anthony Huber MRN: [id number] Date of Birth: Jun 17, 1960  Today's Date: 05/01/2020 OT Individual Time: 4098-1191 OT Individual Time Calculation (min): 45 min    Short Term Goals: Week 3:  OT Short Term Goal 1 (Week 3): Pt will complete sq pivot transfer from wc >< BSC with mod A of 1. OT Short Term Goal 2 (Week 3): Pt will be able to sit to stand with min A of 1 and hold balance with mod A to prep for clothing management. OT Short Term Goal 3 (Week 3): Pt will be able to don a shirt with min A.  Skilled Therapeutic Interventions/Progress Updates:    Pt received in bed and agreeable to trying to sit on BSC to have a BM. He demonstrated more energy and initiation today and was able to roll to R with min, sit up with mod A then held static sit with RUE support with S.  Squat pivot to R to [facility] with mod A.  Min sit to stand with Rue on sink, but then needed mod A to hold static stand for support as therapist pulled pants down.  Using R arm on BSC handle he held static sit with S. Pt able to have a continent BM. Total A with cleansing and clothing. Mod A to sq pivot to L to bed then to L to wc.  Changed catheter bag to leg bag for ease of mobility with therapy.  Pt then transferred with min to his R to therapy mat.  Worked on LUE wt bearing with body rotations for L sh mobility.  LUE A/arom with table slides with trunk mobility forward and in rotations to increase dynamic balance.   Improved participation and effort today.  Handoff to PT.   Therapy Documentation Precautions:  Restrictions Weight Bearing Restrictions: No   Pain: Pain Assessment Pain Score: 0-No pain ADL: ADL Grooming: Moderate assistance Upper Body Bathing: Moderate assistance Where Assessed-Upper Body Bathing: Other (Comment) (BSC) Lower Body Bathing: Moderate assistance Where Assessed-Lower Body Bathing: Other (Comment) (BSC) Upper Body Dressing: Moderate  assistance Where Assessed-Upper Body Dressing:  (BSC) Lower Body Dressing: Maximal assistance Where Assessed-Lower Body Dressing: Other (Comment) (BSC) Toileting: Maximal assistance Where Assessed-Toileting: Bedside Commode Toilet Transfer: Moderate assistance Toilet Transfer Method: Stand pivot Toilet Transfer Equipment: Drop arm bedside commode   Therapy/Group: Co-Treatment  Silvie Obremski 05/01/2020, 9:30 AM

## 2020-05-02 LAB — GLUCOSE, CAPILLARY
Glucose-Capillary: 112 mg/dL — ABNORMAL HIGH (ref 70–99)
Glucose-Capillary: 79 mg/dL (ref 70–99)
Glucose-Capillary: 117 mg/dL — ABNORMAL HIGH (ref 70–99)
Glucose-Capillary: 114 mg/dL — ABNORMAL HIGH (ref 70–99)

## 2020-05-02 NOTE — Progress Notes (Signed)
Physical Therapy Weekly Progress Note  Patient Details  Name: Anthony Huber MRN: [id number] Date of Birth: [birth year]  Beginning of progress report period: April 25, 2020 End of progress report period: May 02, 2020  Today's Date: 05/02/2020 PT Individual Time: 2025-4270 PT Individual Time Calculation (min): 53 min   Patient has met 4 of 4 short term goals. Pt is continuing to make steady progress with therapy. Performances have fluctuated based on fatigue, R knee pain, and attention levels. Pt has transitioned from TIS w/c to hemi-height manual w/c. Pt is completing bed mobility with modA, squat<>pivot transfers with modA, unsupported sitting balance with minA, and can propel his w/c >270f with minA using hemi technique. Pt requires +2 maxA for gait up to ~14fwith R hand rail.  Patient continues to demonstrate the following deficits muscle weakness, decreased cardiorespiratoy endurance, impaired timing and sequencing, unbalanced muscle activation, motor apraxia and decreased motor planning, field cut, decreased midline orientation, decreased attention to left, decreased motor planning and ideational apraxia, decreased initiation, decreased attention, decreased awareness, decreased problem solving, decreased safety awareness and delayed processing and decreased sitting balance, decreased standing balance, decreased postural control, hemiplegia and decreased balance strategies and therefore will continue to benefit from skilled PT intervention to increase functional independence with mobility.  Patient progressing toward long term goals..  Continue plan of care. LTG upgraded to reflect progress.   PT Short Term Goals Week 3:  PT Short Term Goal 1 (Week 3): Pt will consistently perform bed mobility with modA PT Short Term Goal 1 - Progress (Week 3): Met PT Short Term Goal 2 (Week 3): Pt will consistently perform bed<>chair transfers wtih modA and LRAD PT Short Term Goal 2 - Progress (Week 3):  Met PT Short Term Goal 3 (Week 3): Pt will perform sit<>stand transfers with modA and LRAD PT Short Term Goal 3 - Progress (Week 3): Met PT Short Term Goal 4 (Week 3): Pt will sit unsupported for >10 minutes with no more than minA PT Short Term Goal 4 - Progress (Week 3): Met Week 4:  PT Short Term Goal 1 (Week 4): Pt will complete bed mobility with minA PT Short Term Goal 2 (Week 4): Pt will complete bed<>chair transfers with minA and LRAD PT Short Term Goal 3 (Week 4): Pt will perform sit<>stand transfers with minA and LRAD PT Short Term Goal 4 (Week 4): Pt will propel himself [id number] w/c with supervision  Skilled Therapeutic Interventions/Progress Updates:    Pt greeted supine in bed at start of session. Wife and interpreter at bedside. Supine<>sit with modA for LLE and trunk management. Squat<>pivot transfer with modA from EOB to w/c with L knee block. Scoots backwards in w/c for repositioning with minA. Donned tennis shoes with totalA for time management. Pt propelled himself ~[id number] w/c using R hemibody, requiring minA for maintaining straight path and frequent cues for avoiding obstacles on L side of visual field. Squat<>pivot with modA from w/c to mat table with similar technique above. Performed repeated sit<>stands with min/modA and L knee block from low mat table height and task overlay for hanging horseshoes on his L side and in front of him to promote upright posture and LLE weight bearing. Pt needs max cues for midline awareness to reduce L lateral lean in standing.   Pt then completed dynamic sitting balance task with instructions to complete peg board puzzle (mild complexity). Pt needs max cues initially for sequencing of colors, fading to minA. Peg board placed on  L side to encourage L scanning. Pt with LOB to the L while reaching RUE across midline requiring max/totalA for correction. LUE also placed in weight bearing position to facilitate NMR. Squat<> pivot back to w/c with modA  and wheeled back to room with totalA for energy conservation. Squat<>pivot with modA to EOB with cues for safety approach and sequencing and using hands to bed rails to assist. Sit>supine with modA for LLE management and trunk control. Able to bridge in bed to realign hips for repositioning in bed. He remained supine in bed with LUE supported with pillow and needs within reach. Bed alarm on.   Therapy Documentation Precautions:  Restrictions Weight Bearing Restrictions: No General:    Therapy/Group: Individual Therapy  [name] P Dev Dhondt  PT 05/02/2020, 7:37 AM

## 2020-05-02 NOTE — Progress Notes (Signed)
Occupational Therapy Weekly Progress Note  Patient Details  Name: Anthony Huber MRN: [id number] Date of Birth: [birth year]  Beginning of progress report period: April 25, 2020 End of progress report period: May 02, 2020  Today's Date: 05/02/2020 OT Individual Time: 5974-1638 OT Individual Time Calculation (min): 80 min    Patient has met 3 of 3 short term goals.  Pt has made great progress this week with his functional mobility, balance, ability to follow directions.  He is now completing transfers with mod A and has improved sitting balance.    Patient continues to demonstrate the following deficits: muscle weakness, abnormal tone, unbalanced muscle activation and motor apraxia, decreased visual perceptual skills, decreased visual motor skills and hemianopsia, decreased attention to left, decreased attention, decreased awareness, decreased problem solving and delayed processing and decreased sitting balance, decreased standing balance, decreased postural control, hemiplegia and decreased balance strategies and therefore will continue to benefit from skilled OT intervention to enhance overall performance with BADL and Reduce care partner burden.  Patient progressing toward long term goals..  Continue plan of care.  OT Short Term Goals Week 1:  OT Short Term Goal 1 (Week 1): pt will sit EOB wiht MAX A of 1 for 3 min in prep for ADL OT Short Term Goal 1 - Progress (Week 1): Met OT Short Term Goal 2 (Week 1): Pt will manage LUE with multimodal cuing in prep for UB dressing OT Short Term Goal 2 - Progress (Week 1): Progressing toward goal OT Short Term Goal 3 (Week 1): Pt will intiate during transfer training 75% of trials to demo improved command following OT Short Term Goal 3 - Progress (Week 1): Met OT Short Term Goal 4 (Week 1): Pt will groom with MOD A OT Short Term Goal 4 - Progress (Week 1): Met Week 2:  OT Short Term Goal 1 (Week 2): Pt will be able to sit EOB with mod A in prep for self  care. OT Short Term Goal 1 - Progress (Week 2): Met OT Short Term Goal 2 (Week 2): Pt will demonstrate improved L side awareness with bathing UB with min a. OT Short Term Goal 2 - Progress (Week 2): Met OT Short Term Goal 3 (Week 2): Pt will demonstrate improved apraxia to don shirt with mod a. OT Short Term Goal 3 - Progress (Week 2): Met OT Short Term Goal 4 (Week 2): Pt will transfer bed>< wc with mod A of 2. OT Short Term Goal 4 - Progress (Week 2): Met Week 3:  OT Short Term Goal 1 (Week 3): Pt will complete sq pivot transfer from wc >< BSC with mod A of 1. OT Short Term Goal 1 - Progress (Week 3): Met OT Short Term Goal 2 (Week 3): Pt will be able to sit to stand with min A of 1 and hold balance with mod A to prep for clothing management. OT Short Term Goal 2 - Progress (Week 3): Met OT Short Term Goal 3 (Week 3): Pt will be able to don a shirt with min A. OT Short Term Goal 3 - Progress (Week 3): Met Week 4:  OT Short Term Goal 1 (Week 4): Pt will don a shirt with S demonstrating improved L side awareness. OT Short Term Goal 2 (Week 4): Pt will transfer to [facility] with CGA to min A. OT Short Term Goal 3 (Week 4): From [facility] pt will self cleanse with min A to support his balance.  Skilled Therapeutic Interventions/Progress Updates:  Pt received in bed and ready for therapy, already dressed and ready. Wife and interpretor present.  Had pt work on [employer] by holding arm with R hand and use his core strength to come to sit with min A.  Min sq pivot to wc to his L with great initiation.   In gym, trialed standing at parallel bars for 5 min. Placement of LLE very challenging as pt rotates leg outward and tone may be increasing.  But pt had much improve trunk control in upright and neutral posture with mod A to facilitate.  Pt moved to mat to sit EOB.   Applied saebo estim to L forearm for finger and wrist extension for 30 min while working on A/arom and then a functional grasp and release  task of picking up and dropping plastic eggs into basket. Pt did extremely well with this task and responded well to the estim.  Had pt try to hold basket with both hands, needed hand over hand A with L hand.  Estim removed and no adverse skin reactions.   Saebo Stim One 330 pulse width 35 Hz pulse rate On 8 sec/ off 8 sec Ramp up/ down 2 sec Symmetrical Biphasic wave form  Max intensity 182m at 500 Ohm load   LUE wt bearing with R arm reaching and working on pt focusing on balance reactions. If pt reached too far to the left, and would lose his balance cued pt to use his trunk muscles only to pull back to midline.  Pt did extremely well with this activity today.  Min sq to wc to L and then back to bed to R with min A. Pt resting in bed with all needs met and alarm set.    Therapy Documentation Precautions:  Restrictions Weight Bearing Restrictions: No   Pain:  no c/o pain ADL: ADL Grooming: Moderate assistance Upper Body Bathing: Moderate assistance Where Assessed-Upper Body Bathing: Other (Comment) (BSC) Lower Body Bathing: Moderate assistance Where Assessed-Lower Body Bathing: Other (Comment) (BSC) Upper Body Dressing: Moderate assistance Where Assessed-Upper Body Dressing:  (BSC) Lower Body Dressing: Maximal assistance Where Assessed-Lower Body Dressing: Other (Comment) (BSC) Toileting: Maximal assistance Where Assessed-Toileting: Bedside Commode Toilet Transfer: Moderate assistance Toilet Transfer Method: Stand pivot Toilet Transfer Equipment: Drop arm bedside commode   Therapy/Group: Individual Therapy  Jazaria Jarecki 05/02/2020, 12:42 PM

## 2020-05-02 NOTE — Plan of Care (Signed)
  Problem: Sit to Stand Goal: LTG:  Patient will perform sit to stand with assistance level (PT) Description: LTG:  Patient will perform sit to stand with assistance level (PT) Flowsheets (Taken 05/02/2020 0750) LTG: PT will perform sit to stand in preparation for functional mobility with assistance level: Contact Guard/Touching assist Note: Upgraded due to improved LLE strength and sitting balance   Problem: RH Bed Mobility Goal: LTG Patient will perform bed mobility with assist (PT) Description: LTG: Patient will perform bed mobility with assistance, with/without cues (PT). Flowsheets (Taken 05/02/2020 0750) LTG: Pt will perform bed mobility with assistance level of: Contact Guard/Touching assist Note: Upgraded due to improved awareness and strength   Problem: RH Bed to Chair Transfers Goal: LTG Patient will perform bed/chair transfers w/assist (PT) Description: LTG: Patient will perform bed to chair transfers with assistance (PT). Flowsheets (Taken 05/02/2020 0750) LTG: Pt will perform Bed to Chair Transfers with assistance level: Minimal Assistance - Patient > 75% Note: Upgraded due to improved awareness and strength   Problem: RH Wheelchair Mobility Goal: LTG Patient will propel w/c in controlled environment (PT) Description: LTG: Patient will propel wheelchair in controlled environment, # of feet with assist (PT) Flowsheets (Taken 05/02/2020 0750) LTG: Pt will propel w/c in controlled environ  assist needed:: Supervision/Verbal cueing LTG: Propel w/c distance in controlled environment: 200 Note: Upgraded due to improved awareness and coordination

## 2020-05-02 NOTE — Progress Notes (Signed)
Speech Language Pathology Daily Session Note  Patient Details  Name: Anthony Huber MRN: [id number] Date of Birth: 1960/11/15  Today's Date: 05/02/2020 SLP Individual Time: 1779-3903 SLP Individual Time Calculation (min): 45 min  Short Term Goals: Week 3: SLP Short Term Goal 1 (Week 3): Patient will initiate during conversation and functional tasks with minA cues to perform consistently SLP Short Term Goal 2 (Week 3): Patient will perform basic level functional and simulated problem solving tasks with minA to initiate and maintain attention. SLP Short Term Goal 3 (Week 3): Patient will recall and describe at least two daily events (nursing and/or therapy related) with minA cues. SLP Short Term Goal 4 (Week 3): Patient will demonstrate awareness to errors during structured task completion with modA cues.  Skilled Therapeutic Interventions: Skilled SLP intervention focused on cognition and verbal expression. Live interpretor present for session. He required max cues for initiation in conversation. Pt formulated sentences to describe photos with max A for expanding his responses. He increased verbal expression with photos and videos of familiar places and fruits grown in [place] but continued to require mod A verbalcues with SLP asking questions to elaborate on responses. Cont with therapy per plan of care.      Pain Pain Assessment Pain Scale: Faces Faces Pain Scale: No hurt  Therapy/Group: Individual Therapy  Carlean Jews Patrisia Faeth 05/02/2020, 8:42 AM

## 2020-05-02 NOTE — Progress Notes (Signed)
[place] PHYSICAL MEDICINE & REHABILITATION PROGRESS NOTE  Subjective/Complaints: Patient seen sitting up in bed working with therapies this morning.  Interpreter present.  Patient states he slept well overnight.  He denies dizziness.  He denies knee pain.  Discussed apraxia/motor planning and processing with therapies.  Also discussed NMES.  ROS: Denies CP, SOB, N/V/D  Objective: Vital Signs: Blood pressure 122/80, pulse 77, temperature 97.7 F (36.5 C), temperature source Oral, resp. rate 16, height 4\' 11"  (1.499 m), weight 44.9 kg, SpO2 96 %. No results found. Recent Labs    05/01/20 0507  WBC 10.7*  HGB 12.1*  HCT 38.8*  PLT 340   Recent Labs    05/01/20 0507  NA 136  K 5.0  CL 103  CO2 27  GLUCOSE 97  BUN 21  CREATININE 1.07  CALCIUM 9.4    Intake/Output Summary (Last 24 hours) at 05/02/2020 1404 Last data filed at 05/02/2020 0824 Gross per 24 hour  Intake 517 ml  Output 700 ml  Net -183 ml       Physical Exam: BP 122/80 (BP Location: Right Arm)   Pulse 77   Temp 97.7 F (36.5 C) (Oral)   Resp 16   Ht 4\' 11"  (1.499 m)   Wt 44.9 kg   SpO2 96%   BMI 19.99 kg/m  Constitutional: No distress . Vital signs reviewed. HENT: Normocephalic.  Atraumatic. Eyes: EOMI. No discharge. Cardiovascular: No JVD.  RRR. Respiratory: Normal effort.  No stridor.  Bilateral clear to auscultation. GI: Non-distended.  BS +. Skin: Warm and dry.  Intact. Psych: Normal mood.  Normal behavior. Musc: No edema in extremities.  No tenderness in extremities. Neuro: Alert Left facial weakness, unchanged Left gaze preference, unchanged LUE some movement noted in left upper extremity, with significant apraxia LLE: HF 2/5, KE 3-/5, ADF 2 - /5 with apraxia, stable No increase in tone noted  Assessment/Plan: 1. Functional deficits which require 3+ hours per day of interdisciplinary therapy in a comprehensive inpatient rehab setting.  Physiatrist is providing close team  supervision and 24 hour management of active medical problems listed below.  Physiatrist and rehab team continue to assess barriers to discharge/monitor patient progress toward functional and medical goals   Care Tool:  Bathing    Body parts bathed by patient: Face,Chest,Abdomen,Left arm,Front perineal area,Right upper leg,Left upper leg   Body parts bathed by helper: Right arm,Buttocks,Right lower leg,Left lower leg     Bathing assist Assist Level: Moderate Assistance - Patient 50 - 74%     Upper Body Dressing/Undressing Upper body dressing   What is the patient wearing?: Pull over shirt    Upper body assist Assist Level: Moderate Assistance - Patient 50 - 74%    Lower Body Dressing/Undressing Lower body dressing      What is the patient wearing?: Pants,Incontinence brief     Lower body assist Assist for lower body dressing: Maximal Assistance - Patient 25 - 49%     Toileting Toileting    Toileting assist Assist for toileting: Total Assistance - Patient < 25%     Transfers Chair/bed transfer  Transfers assist     Chair/bed transfer assist level: Minimal Assistance - Patient > 75%     Locomotion Ambulation   Ambulation assist   Ambulation activity did not occur: Safety/medical concerns  Assist level: 2 helpers (w/c follow) Assistive device: Other (comment) (hand rail) Max distance: 35ft   Walk 10 feet activity   Assist  Walk 10 feet activity did  not occur: Safety/medical concerns  Assist level: 2 helpers (w/c follow) Assistive device: Other (comment) (hand rail)   Walk 50 feet activity   Assist Walk 50 feet with 2 turns activity did not occur: Safety/medical concerns         Walk 150 feet activity   Assist Walk 150 feet activity did not occur: Safety/medical concerns         Walk 10 feet on uneven surface  activity   Assist Walk 10 feet on uneven surfaces activity did not occur: Safety/medical concerns          Wheelchair     Assist Will patient use wheelchair at discharge?: Yes Type of Wheelchair: Manual Wheelchair activity did not occur: Safety/medical concerns         Wheelchair 50 feet with 2 turns activity    Assist    Wheelchair 50 feet with 2 turns activity did not occur: Safety/medical concerns       Wheelchair 150 feet activity     Assist  Wheelchair 150 feet activity did not occur: Safety/medical concerns       Medical Problem List and Plan: 1.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 Impaired mobility -DVT/anticoagulation:Continue Subcutaneous continue heparin initiated for 02/25/2020 -antiplatelet therapy: N/A 3. Pain Management:continue Tylenol as needed. D/c tramadol.  4. Mood:Provide emotional support -antipsychotic agents: N/A 5. Neuropsych: This patientis notcapable of making decisions on hisown behalf. 6. Skin/Wound Care:Routine skin checks 7. Fluids/Electrolytes/Nutrition:Routine in and outs. 8. Hypertension.   Norvasc 10 mg daily  Lopressor 50 mg twice daily.   D/c'ed clonidine.   Controlled on 4/28 9. History of open Uretlithotomy2005 in [place] as well as left PCNL and laser ureterotomyOctober 2018 followed by Dr. Marlou Porch. 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  -CONTINUE FOLEY TUBE.DO NOT REMOVE .Foley catheter to remain in place until follow-up in the office of Dr. Modena Slater for cystogram. 10. Diet controlled diabetes mellitus. Hemoglobin A1c 5.8. SSI -Educate pt/family as needed CBG (last 3)  Recent Labs    05/01/20 2124 05/02/20 0613 05/02/20 1142  GLUCAP 115* 112* 79   Metformin decreased back to 500mg  daily and restarted CBG checks.   Labile, but?  Improving  on 4/28 11.  Leukocytosis  WBCs 10.7 on 4/27  Afebrile  Recheck 4/19 12.  Acute blood loss anemia  Hemoglobin 12.1 on 4/27  Recheck 4/19 13.  CKD   Creatinine 1.07 on 4/27  Encourage fluids, discussed again 14.  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  Slow transit constipation  Bowel meds increased on 4/8  Improved 16. Lethargy: increased ritalin to 5mg  BID  Improving 17. Emesis   Appears to have resolved   Changed Lipitor to HS to spread out medications   LOS: 21 days A FACE TO FACE EVALUATION WAS PERFORMED  Rad Gramling 6/8 05/02/2020, 2:04 PM

## 2020-05-03 LAB — GLUCOSE, CAPILLARY
Glucose-Capillary: 92 mg/dL (ref 70–99)
Glucose-Capillary: 100 mg/dL — ABNORMAL HIGH (ref 70–99)
Glucose-Capillary: 110 mg/dL — ABNORMAL HIGH (ref 70–99)
Glucose-Capillary: 110 mg/dL — ABNORMAL HIGH (ref 70–99)

## 2020-05-03 MED ORDER — LIDOCAINE 5 % EX PTCH
1.0000 | MEDICATED_PATCH | CUTANEOUS | Status: DC
  Administered 2020-05-03 – 2020-05-16 (×14): 1 via TRANSDERMAL
  Filled 2020-05-03 (×15): qty 1

## 2020-05-03 NOTE — Progress Notes (Signed)
Speech Language Pathology Weekly Progress and Session Note  Patient Details  Name: Jermain Curt MRN: [id number] Date of Birth: 28-Aug-1960  Beginning of progress report period: 04/26/2020 End of progress report period: 05/03/2020  Today's Date: 05/03/2020 SLP Individual Time: 5974-1638 SLP Individual Time Calculation (min): 50 min  Short Term Goals: Week 3: SLP Short Term Goal 1 (Week 3): Patient will initiate during conversation and functional tasks with minA cues to perform consistently SLP Short Term Goal 1 - Progress (Week 3): Not met SLP Short Term Goal 2 (Week 3): Patient will perform basic level functional and simulated problem solving tasks with minA to initiate and maintain attention. SLP Short Term Goal 2 - Progress (Week 3): Progressing toward goal SLP Short Term Goal 3 (Week 3): Patient will recall and describe at least two daily events (nursing and/or therapy related) with minA cues. SLP Short Term Goal 3 - Progress (Week 3): Met SLP Short Term Goal 4 (Week 3): Patient will demonstrate awareness to errors during structured task completion with modA cues. SLP Short Term Goal 4 - Progress (Week 3): Met    New Short Term Goals: Week 4: SLP Short Term Goal 1 (Week 4): Patient will initiate during conversation and functional tasks with modA cues to perform consistently. SLP Short Term Goal 2 (Week 4): Patient will maintain attention to task/conversational topic with modA cues to redirect. SLP Short Term Goal 3 (Week 4): Patient will demonstrate functional problem solving for basic level tasks with minA cues. SLP Short Term Goal 4 (Week 4): Patient will initiate and maintain adequate eye contact with speaker when on right side visual field with modA cues.  Weekly Progress Updates:     Intensity: Minumum of 1-2 x/day, 30 to 90 minutes Frequency: 3 to 5 out of 7 days Duration/Length of Stay: 5/13 Treatment/Interventions: Environmental controls;Functional tasks;Speech/Language  facilitation;Patient/family education;Internal/external aids;Cueing hierarchy;Cognitive remediation/compensation   Daily Session  Skilled Therapeutic Interventions: Patient seen with wife present and interpreter for skilled ST session focusing on cognitive-linguistic goals. Patient required modA verbal cues to initiate using his right arm and leg to push up to reposition in bed. He was then able to write at word level when given a category with minA and then phrase level with modA cues to write activities he enjoyed at home. He required min-modA cues to redirect to new topics during tasks/conversation. Patient picked up banana he wanted to eat and started handing to his wife for her to open it. SLP then provided modA cues to direct patient to problem solve how he could attempt to open it on his own.  Patient continues to demonstrate benefit from skilled SLP  Intervention to maximize cognitive-linguistic abilities prior to discharge.  General    Pain Pain Assessment Pain Scale: 0-10 Faces Pain Scale: No hurt  Therapy/Group: Individual Therapy  Sonia Baller, MA, CCC-SLP Speech Therapy

## 2020-05-03 NOTE — Progress Notes (Signed)
[facility] PHYSICAL MEDICINE & REHABILITATION PROGRESS NOTE  Subjective/Complaints: C/o lower back pain worst at night: added kpad prn and lidocaine patch overnight No other complaints Working with therapy in gym  ROS: Denies CP, SOB, N/V/D, +low back pain  Objective: Vital Signs: Blood pressure 115/69, pulse 64, temperature 97.7 F (36.5 C), temperature source Oral, resp. rate 14, height 4\' 11"  (1.499 m), weight 44.9 kg, SpO2 96 %. No results found. Recent Labs    05/01/20 0507  WBC 10.7*  HGB 12.1*  HCT 38.8*  PLT 340   Recent Labs    05/01/20 0507  NA 136  K 5.0  CL 103  CO2 27  GLUCOSE 97  BUN 21  CREATININE 1.07  CALCIUM 9.4    Intake/Output Summary (Last 24 hours) at 05/03/2020 1159 Last data filed at 05/03/2020 0603 Gross per 24 hour  Intake 240 ml  Output 900 ml  Net -660 ml       Physical Exam: BP 115/69 (BP Location: Right Arm)   Pulse 64   Temp 97.7 F (36.5 C) (Oral)   Resp 14   Ht 4\' 11"  (1.499 m)   Wt 44.9 kg   SpO2 96%   BMI 19.99 kg/m  Gen: no distress, normal appearing HEENT: oral mucosa pink and moist, NCAT Cardio: Reg rate Chest: normal effort, normal rate of breathing Abd: soft, non-distended Ext: no edema Psych: pleasant, normal affect Skin: intact Musc: No edema in extremities.  No tenderness in extremities. Neuro: Alert Left facial weakness, unchanged Left gaze preference, unchanged LUE some movement noted in left upper extremity, with significant apraxia LLE: HF 2/5, KE 3-/5, ADF 2 - /5 with apraxia, stable No increase in tone noted  Assessment/Plan: 1. Functional deficits which require 3+ hours per day of interdisciplinary therapy in a comprehensive inpatient rehab setting.  Physiatrist is providing close team supervision and 24 hour management of active medical problems listed below.  Physiatrist and rehab team continue to assess barriers to discharge/monitor patient progress toward functional and medical  goals   Care Tool:  Bathing    Body parts bathed by patient: Face,Chest,Abdomen,Left arm,Front perineal area,Right upper leg,Left upper leg   Body parts bathed by helper: Right arm,Buttocks,Right lower leg,Left lower leg     Bathing assist Assist Level: Moderate Assistance - Patient 50 - 74%     Upper Body Dressing/Undressing Upper body dressing   What is the patient wearing?: Pull over shirt    Upper body assist Assist Level: Moderate Assistance - Patient 50 - 74%    Lower Body Dressing/Undressing Lower body dressing      What is the patient wearing?: Pants,Incontinence brief     Lower body assist Assist for lower body dressing: Maximal Assistance - Patient 25 - 49%     Toileting Toileting    Toileting assist Assist for toileting: Total Assistance - Patient < 25%     Transfers Chair/bed transfer  Transfers assist     Chair/bed transfer assist level: Minimal Assistance - Patient > 75%     Locomotion Ambulation   Ambulation assist   Ambulation activity did not occur: Safety/medical concerns  Assist level: 2 helpers (w/c follow) Assistive device: Other (comment) (hand rail) Max distance: 28ft   Walk 10 feet activity   Assist  Walk 10 feet activity did not occur: Safety/medical concerns  Assist level: 2 helpers (w/c follow) Assistive device: Other (comment) (hand rail)   Walk 50 feet activity   Assist Walk 50 feet with 2  turns activity did not occur: Safety/medical concerns         Walk 150 feet activity   Assist Walk 150 feet activity did not occur: Safety/medical concerns         Walk 10 feet on uneven surface  activity   Assist Walk 10 feet on uneven surfaces activity did not occur: Safety/medical concerns         Wheelchair     Assist Will patient use wheelchair at discharge?: Yes Type of Wheelchair: Manual Wheelchair activity did not occur: Safety/medical concerns         Wheelchair 50 feet with 2 turns  activity    Assist    Wheelchair 50 feet with 2 turns activity did not occur: Safety/medical concerns       Wheelchair 150 feet activity     Assist  Wheelchair 150 feet activity did not occur: Safety/medical concerns       Medical Problem List and Plan: 1.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 Impaired mobility -DVT/anticoagulation:Continue Subcutaneous heparin initiated for 02/25/2020 -antiplatelet therapy: N/A 3. Lower back pain:continue Tylenol as needed. D/c tramadol. Added kpad and lidocaine patch overnight 4. Mood:Provide emotional support -antipsychotic agents: N/A 5. Neuropsych: This patientis notcapable of making decisions on hisown behalf. 6. Skin/Wound Care:Routine skin checks 7. Fluids/Electrolytes/Nutrition:Routine in and outs. 8. Hypertension.   Continue Norvasc 10 mg daily  Lopressor 50 mg twice daily.   D/c'ed clonidine.   Controlled on 4/29 9. History of open Uretlithotomy2005 in [place] as well as left PCNL and laser ureterotomyOctober 2018 followed by Dr. Marlou Porch. 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  -CONTINUE FOLEY TUBE.DO NOT REMOVE .Foley catheter to remain in place until follow-up in the office of Dr. Modena Slater for cystogram. 10. Diet controlled diabetes mellitus. Hemoglobin A1c 5.8. SSI -Educate pt/family as needed CBG (last 3)  Recent Labs    05/02/20 2022 05/03/20 0618 05/03/20 1158  GLUCAP 114* 92 100*   Metformin decreased back to 500mg  daily and restarted CBG checks.   Labile, but?  Improving on 4/28 11.  Leukocytosis  WBCs 10.7 on 4/27  Afebrile  Recheck 4/19 12.  Acute blood loss anemia  Hemoglobin 12.1 on 4/27  Recheck 4/19 13.  CKD   Creatinine 1.07  on 4/27  Encourage fluids, discussed again 14.  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  Slow transit constipation  Bowel meds increased on 4/8  Improved 16. Lethargy: increased ritalin to 5mg  BID  Improving 17. Emesis   Appears to have resolved   Changed Lipitor to HS to spread out medications   LOS: 22 days A FACE TO FACE EVALUATION WAS PERFORMED  Truth Barot P Anapaola Kinsel 05/03/2020, 11:59 AM

## 2020-05-03 NOTE — Progress Notes (Signed)
Occupational Therapy Session Note  Patient Details  Name: Anthony Huber MRN: [id number] Date of Birth: Apr 09, 1960  Today's Date: 05/03/2020 OT Individual Time: 0903-1003 OT Individual Time Calculation (min): 60 min    Short Term Goals: Week 4:  OT Short Term Goal 1 (Week 4): Pt will don a shirt with S demonstrating improved L side awareness. OT Short Term Goal 2 (Week 4): Pt will transfer to [facility] with CGA to min A. OT Short Term Goal 3 (Week 4): From [facility] pt will self cleanse with min A to support his balance.  Skilled Therapeutic Interventions/Progress Updates:    Pt received semi-reclined in bed, denies pain throughout session, agreeable to therapy. Wife and interpreter present throughout session. Session focus on dynamic sitting balance, functional mobility, and LUE NMR/WB in prep for improved ADL performance. Came to sitting EOB with min A to progress BLE off bed + to lift trunk. Maintains static sitting balance close S to CGA, although when initiating squat-pivot to the L req mod A to return to midline. Squat-pivot > w/c on L with mod A to prevent L lean and to lift hips. Pt self-propelled w/c to and from gym with consistent min A to prevent L veering. Squat-pivot to and from mat same manner as above. Completed massed practice of LUE WB/NMR via lateral leans, forward towel slides, side lying L shoulder flexion, and scapular elevation/retraction. Pt able to initiate some scapular elevation and slight elbow flexion, but cont to req overall total A to complete movements and max A to return to midline when lean L. Finally, focused on L visual scanning with use of BITS single target game. Pt able to complete 2 rounds, initially req heavy VCs to locate targets on L side, but demonstrating improvement in initiating L head turns towards final round.    Pt left in w/c with lap tray on, safety belt on (no alarm present in room, NT notified,) wife present,  call bell in reach, and all immediate needs met.     Therapy Documentation Precautions:  Restrictions Weight Bearing Restrictions: No Pain:   denies ADL: See Care Tool for more details. Therapy/Group: Individual Therapy  Volanda Napoleon MS, OTR/L  05/03/2020, 6:42 AM

## 2020-05-03 NOTE — Progress Notes (Signed)
Physical Therapy Session Note  Patient Details  Name: Anthony Huber MRN: [id number] Date of Birth: 1960-12-12  Today's Date: 05/03/2020 PT Individual Time: 1300-1356 PT Individual Time Calculation (min): 56 min   Short Term Goals: Week 4:  PT Short Term Goal 1 (Week 4): Pt will complete bed mobility with minA PT Short Term Goal 2 (Week 4): Pt will complete bed<>chair transfers with minA and LRAD PT Short Term Goal 3 (Week 4): Pt will perform sit<>stand transfers with minA and LRAD PT Short Term Goal 4 (Week 4): Pt will propel himself 112ft in w/c with supervision  Skilled Therapeutic Interventions/Progress Updates:     Pt greeted supine in bed. Wife and interpreter at bedside. Pt agreeable to PT and no reports of pain. Pt performed supine<>sit with SUPERVISION (!) with use of bedrail and HOB minimally elevated. Wife emptied catheter bag with appropriate understanding and cleanliness. Donned tennis-shoes with totalA for time management.   Performed squat<>pivot transfer with min/modA and L knee block from EOB to w/c with cues for sequencing and technique. Lengthy discussion with wife regarding DC planning and confirming family ed for tomorrow with their son. Called son and they confirmed. Asked wife if she felt comfortable attempting transfer from EOB to w/c and she confidently reported 'yes.' She completed x4 reps of stand<pivot transfers with therapist providing CGA for safety. Instructed wife on L knee block technique and allowing patient to do AS MUCH AS POSSIBLE because he can stand with CGA/minA. Also discussed squat <>pivot vs stand<>pivot transfers. Wife will benefit from further training on this.   W/c transport for time management to main rehab gym. Educated wife on w/c positioning and management to prepare for transfers (brake locks, removing leg and arm rests, positioning of chair, etc). Instructed wife on squat<>pivot technique instead of stand<>pivot technique. Wife performed x4  additional squat<>pivot transfers with therapist providing CGA for safety. Again, needing cues for general technique, sequencing, and safety.  Asked wife for son to bring in home measurements and discussed home safety - wife reports bed height at home is "very small." Language barrier limiting understanding but appears that bed height = a mattress laying on ground. Showed her the 8inch curb in the rehab gym and she reports that this is similar height to their bed. Discussed options such as hospital bed vs bed rises for theirs, will need to discuss this with son.   Performed car transfer with car height set to simulate small sedan - pt needing a heavy modA for completing this and a heavy modA for repositioning once seated in car. He need assist for LE and trunk management for this. Deferred wife attempting until patient becomes more proficient.   Returned to room with totalA in w/c for time management. Completed squat<>pivot transfer with min/modA from w/c to EOB. Removed shoes with totalA for time management. Sit>supine with modA for BLE management and pt remained supine in bed at end of session with needs in reach and bed alarm on.   Therapy Documentation Precautions:  Restrictions Weight Bearing Restrictions: No General:    Therapy/Group: Individual Therapy  [name] P Tijuana Scheidegger PT 05/03/2020, 7:42 AM

## 2020-05-04 LAB — GLUCOSE, CAPILLARY
Glucose-Capillary: 92 mg/dL (ref 70–99)
Glucose-Capillary: 115 mg/dL — ABNORMAL HIGH (ref 70–99)
Glucose-Capillary: 76 mg/dL (ref 70–99)

## 2020-05-04 NOTE — Progress Notes (Signed)
Physical Therapy Session Note  Patient Details  Name: Anthony Huber MRN: [id number] Date of Birth: 1960/10/08  Today's Date: 05/04/2020 PT Individual Time: 1009-1102 PT Individual Time Calculation (min): 53 min   Short Term Goals: Week 4:  PT Short Term Goal 1 (Week 4): Pt will complete bed mobility with minA PT Short Term Goal 2 (Week 4): Pt will complete bed<>chair transfers with minA and LRAD PT Short Term Goal 3 (Week 4): Pt will perform sit<>stand transfers with minA and LRAD PT Short Term Goal 4 (Week 4): Pt will propel himself 156ft in w/c with supervision  Skilled Therapeutic Interventions/Progress Updates:    Pt received supine in bed with his wife present and pt agreeable to therapy session. In-person interpreter, Susa Raring, present throughout session. Pt's son shortly arriving after. Therapy session focused on hands-on education/training. Supine>sitting R EOB, with min assist for L hemibody management. Pt sat EOB using R UE support as needed with distant supervision during the following education with no LOB noted.  Discussed the following with the family:  - pt's L inattention/field cut with implications on mobility - pt's CLOF with need to perform squat pivot transfers at home with family assistance - plan for custom wheelchair consultation so that pt can be more independent with functional household mobility at wheelchair level - option for family to purchase a stedy OOP if needed to assist patient at home  - bed height - pt's son reports currently pt just has a mattress on the ground and therefore has a height of 8" from the floor - extensive education to family that pt would need a bed height of ~18inches as that is the height of his w/c and would allow for level transfers - pt's son in agreement and planning to purchase a bed frame (briefly discussed hospital bed option; however, unsure at this time if pt will need those features at D/C) - demonstrated w/c part management  including: brakes, flipping back armrests, and leg rest management - pt's need for full time support at home with pt's wife planning to be the primary caregiver as pt's son works full time [employer] of therapy session focusing on hands-on training. [name] demonstrated ability to properly set-up wheelchair for squat pivot transfer to EOB without cuing, demonstrating proper w/c part management. [name] assisted pt in squat pivot transfers w/c<>EOB 2x with only min cuing - pt's wife demonstrates proper guarding of pt's L LE and assistance to complete transfer with only close supervision of therapist for safety - pt's wife demos great assistance technique. Pt's son repeated the same transfer demonstrating good understanding; however, has a tendency to over-assist pt and lift him up too high, but is safe. Pt/family report no questions/concerns at this time. Pt left sitting in w/c with hand-off to [place], [place].    Therapy Documentation Precautions:  Precautions Precautions: Fall,Other (comment) Precaution Comments: L hemi, neglect Restrictions Weight Bearing Restrictions: No  Pain: No indications or reports of pain during session.   Therapy/Group: Individual Therapy  Ginny Forth , PT, DPT, CSRS  05/04/2020, 7:54 AM

## 2020-05-04 NOTE — Progress Notes (Signed)
Occupational Therapy Session Note  Patient Details  Name: Anthony Huber MRN: [id number] Date of Birth: 1960-11-06  Today's Date: 05/04/2020 OT Individual Time: 1102-1202 OT Individual Time Calculation (min): 60 min    Short Term Goals: Week 4:  OT Short Term Goal 1 (Week 4): Pt will don a shirt with S demonstrating improved L side awareness. OT Short Term Goal 2 (Week 4): Pt will transfer to [facility] with CGA to min A. OT Short Term Goal 3 (Week 4): From [facility] pt will self cleanse with min A to support his balance.  Skilled Therapeutic Interventions/Progress Updates:    Pt's spouse and son present for session to begin family education.  Worked on toilet transfers from wheelchair to the drop arm commode as well as sit to stand for clothing management.  Discussed with son about DME needs and that currently pt would need a drop arm commode for transfers squat pivot at that is the safest level currently.  Had pt complete squat pivot transfer to the drop arm commode to the left side as well as completion of standing for simulated clothing management at the same level.  Educated spouse on safe assist with scooting to the edge of the chair or drop arm in preparation for transfer as well.  She was able to assist him with squat pivot transfer to and from the drop arm commode safely as well as completion of clothing management sit to stand.  She tended to complete more of a half standing pivot transfer instead of staying low in a squat, but it still looked pretty safe.  Feel she will benefit from more continued practice.  Pt's son did not practice this session.  Took them down to the ADL apartment where therapist educated them on need for a tub bench to complete walk-in shower transfers.  Pt completed squat pivot transfer to and from the bench with mod assist, but family did not attempt secondary to time.  Discussed the need to measure how wide the sliding doors are on the shower as they may need to be removed if there is  not enough room for the bench to fit and pt to be able to bring his LEs in.  Finished session with return to the room and pt left sitting up in the wheelchair with the call button and phone in reach and safety belt in place.  Family to remain present as well.    Therapy Documentation Precautions:  Precautions Precautions: Fall,Other (comment) Precaution Comments: L hemi, neglect Restrictions Weight Bearing Restrictions: No   Pain: Pain Assessment Pain Scale: Faces Pain Score: 0-No pain Faces Pain Scale: No hurt ADL: See Care Tool Section for some details of mobility and selfcare  Therapy/Group: Individual Therapy  Allard Lightsey OTR/L 05/04/2020, 12:46 PM

## 2020-05-04 NOTE — Progress Notes (Signed)
PROGRESS NOTE   Subjective/Complaints: Had family education today with Anthony Huber. Patient's spouse and son involved. Patient has no concerns today Vitals stable  ROS: denies pain  Objective:   No results found. No results for input(s): WBC, HGB, HCT, PLT in the last 72 hours. No results for input(s): NA, K, CL, CO2, GLUCOSE, BUN, CREATININE, CALCIUM in the last 72 hours.  Intake/Output Summary (Last 24 hours) at 05/04/2020 1644 Last data filed at 05/04/2020 0800 Gross per 24 hour  Intake 177 ml  Output 500 ml  Net -323 ml        Physical Exam: Vital Signs Blood pressure 115/75, pulse 71, temperature 98.1 F (36.7 C), temperature source Oral, resp. rate 17, height 4\' 11"  (1.499 m), weight 44.9 kg, SpO2 98 %. Gen: no distress, normal appearing HEENT: oral mucosa pink and moist, NCAT Cardio: Reg rate Chest: normal effort, normal rate of breathing Abd: soft, non-distended Ext: no edema Psych: pleasant, normal affect Skin: intact Neuro: Alert Left facial weakness, unchanged Left gaze preference, unchanged LUE some movement noted in left upper extremity, with significant apraxia LLE: HF 2/5, KE 3-/5, ADF 2 - /5 with apraxia, stable No increase in tone noted   Assessment/Plan: 1. Functional deficits which require 3+ hours per day of interdisciplinary therapy in a comprehensive inpatient rehab setting.  Physiatrist is providing close team supervision and 24 hour management of active medical problems listed below.  Physiatrist and rehab team continue to assess barriers to discharge/monitor patient progress toward functional and medical goals  Care Tool:  Bathing    Body parts bathed by patient: Face,Chest,Abdomen,Left arm,Front perineal area,Right upper leg,Left upper leg   Body parts bathed by helper: Right arm,Buttocks,Right lower leg,Left lower leg     Bathing assist Assist Level: Moderate Assistance - Patient  50 - 74%     Upper Body Dressing/Undressing Upper body dressing   What is the patient wearing?: Pull over shirt    Upper body assist Assist Level: Moderate Assistance - Patient 50 - 74%    Lower Body Dressing/Undressing Lower body dressing      What is the patient wearing?: Pants,Incontinence brief     Lower body assist Assist for lower body dressing: Maximal Assistance - Patient 25 - 49%     Toileting Toileting    Toileting assist Assist for toileting: Total Assistance - Patient < 25%     Transfers Chair/bed transfer  Transfers assist     Chair/bed transfer assist level: Moderate Assistance - Patient 50 - 74%     Locomotion Ambulation   Ambulation assist   Ambulation activity did not occur: Safety/medical concerns  Assist level: 2 helpers (w/c follow) Assistive device: Other (comment) (hand rail) Max distance: 2ft   Walk 10 feet activity   Assist  Walk 10 feet activity did not occur: Safety/medical concerns  Assist level: 2 helpers (w/c follow) Assistive device: Other (comment) (hand rail)   Walk 50 feet activity   Assist Walk 50 feet with 2 turns activity did not occur: Safety/medical concerns         Walk 150 feet activity   Assist Walk 150 feet activity did not occur: Safety/medical concerns  Walk 10 feet on uneven surface  activity   Assist Walk 10 feet on uneven surfaces activity did not occur: Safety/medical concerns         Wheelchair     Assist Will patient use wheelchair at discharge?: Yes Type of Wheelchair: Manual Wheelchair activity did not occur: Safety/medical concerns         Wheelchair 50 feet with 2 turns activity    Assist    Wheelchair 50 feet with 2 turns activity did not occur: Safety/medical concerns       Wheelchair 150 feet activity     Assist  Wheelchair 150 feet activity did not occur: Safety/medical concerns       Blood pressure 115/75, pulse 71, temperature 98.1  F (36.7 C), temperature source Oral, resp. rate 17, height 4\' 11"  (1.499 m), weight 44.9 kg, SpO2 98 %.  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 Impaired mobility -DVT/anticoagulation:Continue Subcutaneous heparin initiated for 02/25/2020 -antiplatelet therapy: N/A 3. Lower back pain:continue Tylenol as needed. D/c tramadol. Added kpad and lidocaine patch overnight 4. Mood:Provide emotional support -antipsychotic agents: N/A 5. Neuropsych: This patientis notcapable of making decisions on hisown behalf. 6. Skin/Wound Care:Routine skin checks 7. Fluids/Electrolytes/Nutrition:Routine in and outs. 8. Hypertension.              Continue Norvasc 10 mg daily             Lopressor 50 mg twice daily.              D/c'ed clonidine.              Controlled on 4/30- continue current regimen 9. History of open Uretlithotomy2005 in 5/30 as well as left PCNL and laser ureterotomyOctober 2018 followed by Dr. November 2018. 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  -CONTINUE FOLEY TUBE.DO NOT REMOVE .Foley catheter to remain in place until follow-up in the office of Dr. Marlou Porch for cystogram. 10. Diet controlled diabetes mellitus. Hemoglobin A1c 5.8. SSI -Educate pt/family as needed CBG (last 3)  Recent Labs (last 2 labs)        Recent Labs    05/02/20 2022 05/03/20 0618 05/03/20 1158  GLUCAP 114* 92 100*                Metformin decreased back to 500mg  daily and restarted CBG checks. Continue metformin             Labile, but?  Improving on 4/30 11.  Leukocytosis             WBCs 10.7 on 4/27             Afebrile             Recheck 4/19 12.  Acute blood loss  anemia             Hemoglobin 12.1 on 4/27             Recheck 4/19 13.  CKD              Creatinine 1.07 on 4/27             Encourage fluids, discussed  again 14.  Gout and OA- in right knee             X-ray ordered showing mild degenerative changes with small effusion             Voltaren gel             Colchicine restarted             Improving 15.  Slow transit constipation             Bowel meds increased on 4/8             Improved 16. Lethargy: increased ritalin to 5mg  BID             Improving 17. Emesis              Appears to have resolved              Changed Lipitor to HS to spread out medications     LOS: 23 days A FACE TO FACE EVALUATION WAS PERFORMED  Anthony Huber 05/04/2020, 4:44 PM

## 2020-05-05 LAB — GLUCOSE, CAPILLARY
Glucose-Capillary: 79 mg/dL (ref 70–99)
Glucose-Capillary: 106 mg/dL — ABNORMAL HIGH (ref 70–99)
Glucose-Capillary: 137 mg/dL — ABNORMAL HIGH (ref 70–99)

## 2020-05-05 NOTE — Progress Notes (Signed)
PROGRESS NOTE   Subjective/Complaints: Patient's chart reviewed- No issues reported overnight Vitals signs stable  ROS: denies pain  Objective:   No results found. No results for input(s): WBC, HGB, HCT, PLT in the last 72 hours. No results for input(s): NA, K, CL, CO2, GLUCOSE, BUN, CREATININE, CALCIUM in the last 72 hours.  Intake/Output Summary (Last 24 hours) at 05/05/2020 1249 Last data filed at 05/05/2020 0600 Gross per 24 hour  Intake --  Output 200 ml  Net -200 ml        Physical Exam: Vital Signs Blood pressure 107/68, pulse 71, temperature 98 F (36.7 C), temperature source Oral, resp. rate 16, height 4\' 11"  (1.499 m), weight 44.9 kg, SpO2 94 %. Gen: no distress, normal appearing HEENT: oral mucosa pink and moist, NCAT Cardio: Reg rate Chest: normal effort, normal rate of breathing Abd: soft, non-distended Ext: no edema Psych: pleasant, normal affect Left facial weakness, unchanged Left gaze preference, unchanged LUE some movement noted in left upper extremity, with significant apraxia LLE: HF 2/5, KE 3-/5, ADF 2 - /5 with apraxia, stable No increase in tone noted   Assessment/Plan: 1. Functional deficits which require 3+ hours per day of interdisciplinary therapy in a comprehensive inpatient rehab setting.  Physiatrist is providing close team supervision and 24 hour management of active medical problems listed below.  Physiatrist and rehab team continue to assess barriers to discharge/monitor patient progress toward functional and medical goals  Care Tool:  Bathing    Body parts bathed by patient: Face,Chest,Abdomen,Left arm,Front perineal area,Right upper leg,Left upper leg   Body parts bathed by helper: Right arm,Buttocks,Right lower leg,Left lower leg     Bathing assist Assist Level: Moderate Assistance - Patient 50 - 74%     Upper Body Dressing/Undressing Upper body dressing   What is  the patient wearing?: Pull over shirt    Upper body assist Assist Level: Moderate Assistance - Patient 50 - 74%    Lower Body Dressing/Undressing Lower body dressing      What is the patient wearing?: Pants,Incontinence brief     Lower body assist Assist for lower body dressing: Maximal Assistance - Patient 25 - 49%     Toileting Toileting    Toileting assist Assist for toileting: Total Assistance - Patient < 25%     Transfers Chair/bed transfer  Transfers assist     Chair/bed transfer assist level: Moderate Assistance - Patient 50 - 74%     Locomotion Ambulation   Ambulation assist   Ambulation activity did not occur: Safety/medical concerns  Assist level: 2 helpers (w/c follow) Assistive device: Other (comment) (hand rail) Max distance: 31ft   Walk 10 feet activity   Assist  Walk 10 feet activity did not occur: Safety/medical concerns  Assist level: 2 helpers (w/c follow) Assistive device: Other (comment) (hand rail)   Walk 50 feet activity   Assist Walk 50 feet with 2 turns activity did not occur: Safety/medical concerns         Walk 150 feet activity   Assist Walk 150 feet activity did not occur: Safety/medical concerns         Walk 10 feet on uneven surface  activity   Assist Walk 10 feet on uneven surfaces activity did not occur: Safety/medical concerns         Wheelchair     Assist Will patient use wheelchair at discharge?: Yes Type of Wheelchair: Manual Wheelchair activity did not occur: Safety/medical concerns         Wheelchair 50 feet with 2 turns activity    Assist    Wheelchair 50 feet with 2 turns activity did not occur: Safety/medical concerns       Wheelchair 150 feet activity     Assist  Wheelchair 150 feet activity did not occur: Safety/medical concerns       Blood pressure 107/68, pulse 71, temperature 98 F (36.7 C), temperature source Oral, resp. rate 16, height 4\' 11"  (1.499 m),  weight 44.9 kg, SpO2 94 %.  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 Impaired mobility -DVT/anticoagulation:Continue Subcutaneous heparin initiated for 02/25/2020 -antiplatelet therapy: N/A 3. Lower back pain:continue Tylenol as needed. D/c tramadol. Added kpad and lidocaine patch overnight 4. Mood:Provide emotional support -antipsychotic agents: N/A 5. Neuropsych: This patientis notcapable of making decisions on hisown behalf. 6. Skin/Wound Care:Routine skin checks 7. Fluids/Electrolytes/Nutrition:Routine in and outs. 8. Hypertension.              Continue Norvasc 10 mg daily             Lopressor 50 mg twice daily.              D/c'ed clonidine.              Controlled on 5/1- continue current regimen 9. History of open Uretlithotomy2005 in 7/1 as well as left PCNL and laser ureterotomyOctober 2018 followed by Dr. November 2018. 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  -CONTINUE FOLEY TUBE.DO NOT REMOVE .Foley catheter to remain in place until follow-up in the office of Dr. Marlou Porch for cystogram. 10. Diet controlled diabetes mellitus. Hemoglobin A1c 5.8. SSI -Educate pt/family as needed CBG (last 3)  Recent Labs (last 2 labs)        Recent Labs    05/02/20 2022 05/03/20 0618 05/03/20 1158  GLUCAP 114* 92 100*                Metformin decreased back to 500mg  daily and restarted CBG checks. Continue metformin             Labile, but?  Improving on 5/1 11.  Leukocytosis             WBCs 10.7 on 4/27             Afebrile             Recheck 4/19 12.  Acute blood loss anemia             Hemoglobin 12.1 on 4/27             Recheck 4/19 13.  CKD               Creatinine 1.07 on 4/27             Encourage fluids, discussed again 14.  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  Slow transit constipation             Bowel meds increased on 4/8             Improved 16. Lethargy: increased ritalin to 5mg  BID             Improving 17. Emesis              Appears to have resolved              Changed Lipitor to HS to spread out medications     LOS: 24 days A FACE TO FACE EVALUATION WAS PERFORMED  Tamma Brigandi 05/05/2020, 12:49 PM

## 2020-05-06 NOTE — Progress Notes (Signed)
PROGRESS NOTE   Subjective/Complaints: Patient seen lying in bed this morning, about to work with therapies.  Wife at bedside.  Patient indicates he slept well overnight.  No reported issues.  ROS: Limited due to language  Objective:   No results found. No results for input(s): WBC, HGB, HCT, PLT in the last 72 hours. No results for input(s): NA, K, CL, CO2, GLUCOSE, BUN, CREATININE, CALCIUM in the last 72 hours.  Intake/Output Summary (Last 24 hours) at 05/06/2020 1310 Last data filed at 05/05/2020 1835 Gross per 24 hour  Intake 240 ml  Output 450 ml  Net -210 ml        Physical Exam: Vital Signs Blood pressure 127/88, pulse 80, temperature 98 F (36.7 C), resp. rate 16, height 4\' 11"  (1.499 m), weight 44.9 kg, SpO2 96 %. Constitutional: No distress . Vital signs reviewed. HENT: Normocephalic.  Atraumatic. Eyes: EOMI. No discharge. Cardiovascular: No JVD.   Respiratory: Normal effort.  No stridor.   GI: Non-distended.   Skin: Warm and dry.  Intact. Psych: Normal mood.  Normal behavior. Musc: No edema in extremities.  No tenderness in extremities. Neuro: Alert Left facial weakness LUE 1+/5, with significant apraxia LLE: HF 2+/5, KE 3/5, ADF 1 /5 with apraxia No increase in tone noted   Assessment/Plan: 1. Functional deficits which require 3+ hours per day of interdisciplinary therapy in a comprehensive inpatient rehab setting.  Physiatrist is providing close team supervision and 24 hour management of active medical problems listed below.  Physiatrist and rehab team continue to assess barriers to discharge/monitor patient progress toward functional and medical goals  Care Tool:  Bathing    Body parts bathed by patient: Face,Chest,Abdomen,Left arm,Front perineal area,Right upper leg,Left upper leg,Right lower leg   Body parts bathed by helper: Right arm,Buttocks,Left lower leg     Bathing assist Assist  Level: Moderate Assistance - Patient 50 - 74%     Upper Body Dressing/Undressing Upper body dressing   What is the patient wearing?: Pull over shirt    Upper body assist Assist Level: Moderate Assistance - Patient 50 - 74%    Lower Body Dressing/Undressing Lower body dressing      What is the patient wearing?: Pants,Incontinence brief     Lower body assist Assist for lower body dressing: Maximal Assistance - Patient 25 - 49%     Toileting Toileting    Toileting assist Assist for toileting: Total Assistance - Patient < 25%     Transfers Chair/bed transfer  Transfers assist     Chair/bed transfer assist level: Minimal Assistance - Patient > 75%     Locomotion Ambulation   Ambulation assist   Ambulation activity did not occur: Safety/medical concerns  Assist level: 2 helpers (w/c follow) Assistive device: Other (comment) (hand rail) Max distance: 23ft   Walk 10 feet activity   Assist  Walk 10 feet activity did not occur: Safety/medical concerns  Assist level: 2 helpers (w/c follow) Assistive device: Other (comment) (hand rail)   Walk 50 feet activity   Assist Walk 50 feet with 2 turns activity did not occur: Safety/medical concerns         Walk 150 feet activity  Assist Walk 150 feet activity did not occur: Safety/medical concerns         Walk 10 feet on uneven surface  activity   Assist Walk 10 feet on uneven surfaces activity did not occur: Safety/medical concerns         Wheelchair     Assist Will patient use wheelchair at discharge?: Yes Type of Wheelchair: Manual Wheelchair activity did not occur: Safety/medical concerns         Wheelchair 50 feet with 2 turns activity    Assist    Wheelchair 50 feet with 2 turns activity did not occur: Safety/medical concerns       Wheelchair 150 feet activity     Assist  Wheelchair 150 feet activity did not occur: Safety/medical concerns       Medical Problem  List and Plan: 1.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 Impaired mobility -DVT/anticoagulation:Continue Subcutaneous heparin initiated for 02/25/2020 -antiplatelet therapy: N/A 3. Lower back pain:continue Tylenol as needed. D/c tramadol. Added kpad and lidocaine patch overnight 4. Mood:Provide emotional support -antipsychotic agents: N/A 5. Neuropsych: This patientis notcapable of making decisions on hisown behalf. 6. Skin/Wound Care:Routine skin checks 7. Fluids/Electrolytes/Nutrition:Routine in and outs. 8. Hypertension.              Continue Norvasc 10 mg daily             Lopressor 50 mg twice daily.              D/c'ed clonidine.              Controlled on 5/2 9. History of open Uretlithotomy2005 in [place] as well as left PCNL and laser ureterotomyOctober 2018 followed by Dr. Marlou Porch. 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  -CONTINUE FOLEY TUBE.DO NOT REMOVE .Foley catheter to remain in place until follow-up in the office of Dr. Modena Slater for cystogram. 10. Diet controlled diabetes mellitus. Hemoglobin A1c 5.8. SSI -Educate pt/family as needed             Metformin decreased back to 500mg  daily and restarted CBG checks. Continue metformin            Labile on 5/2 11.  Leukocytosis             WBCs 10.7 on 4/27 plan to order labs later this week             Afebrile             Recheck 4/19 12.  Acute blood loss anemia             Hemoglobin 12.1 on 4/27             Recheck 4/19 13.  CKD              Creatinine 1.07 on 4/27             Encourage fluids, discussed again 14.  Gout and OA- in right knee             X-ray ordered showing mild degenerative changes with small  effusion             Voltaren gel  Colchicine restarted             proving 15.  Slow transit constipation             Bowel meds increased on 4/8             Improved 16. Lethargy: increased ritalin to 5mg  BID             improving 17. Emesis              Appears to have resolved              Changed Lipitor to HS to spread out medications     LOS: 25 days A FACE TO FACE EVALUATION WAS PERFORMED  Efrata Brunner 05/06/2020, 1:10 PM

## 2020-05-06 NOTE — Progress Notes (Signed)
Speech Language Pathology Daily Session Note  Patient Details  Name: Anthony Huber MRN: [id number] Date of Birth: [birth year]  Today's Date: 05/06/2020 SLP Individual Time: 0801-0830 SLP Individual Time Calculation (min): 29 min  Short Term Goals: Week 4: SLP Short Term Goal 1 (Week 4): Patient will initiate during conversation and functional tasks with modA cues to perform consistently. SLP Short Term Goal 2 (Week 4): Patient will maintain attention to task/conversational topic with modA cues to redirect. SLP Short Term Goal 3 (Week 4): Patient will demonstrate functional problem solving for basic level tasks with minA cues. SLP Short Term Goal 4 (Week 4): Patient will initiate and maintain adequate eye contact with speaker when on right side visual field with modA cues.  Skilled Therapeutic Interventions:Skilled ST services focused on education and language skills. SLP utilized Archivist. Pt required mod A fading to min A verbal cues to initiate conversation after education and max A fading to mod A verbal cues to make eye contact with interpretor in right visual field. Pt consumed regular textures and thin liquid breakfast with ability to self-fed, SLP only assisting in getting items on spoon. Pt asked questions about current health/diet restrictions. SLP educated pt and pt's wife on current carb modified diet, providing handout in [place] and in [place]. SLP reviewed key points on how to measure/count carbs and types of foods that have high carb count. [name] stated, her daughter in law can read viennese and she will give the packet to her. Pt was left in room with wife, call bell within reach and bed alarm set. SLP recommends to continue skilled services.     Pain Pain Assessment Pain Score: 0-No pain  Therapy/Group: Individual Therapy  Jaedah Lords  [facility] 05/06/2020, 1:31 PM

## 2020-05-06 NOTE — Progress Notes (Signed)
Physical Therapy Session Note  Patient Details  Name: Colum Colt MRN: [id number] Date of Birth: [birth year]  Today's Date: 05/06/2020 PT Individual Time: 1300-1356 PT Individual Time Calculation (min): 56 min   Short Term Goals: Week 4:  PT Short Term Goal 1 (Week 4): Pt will complete bed mobility with minA PT Short Term Goal 2 (Week 4): Pt will complete bed<>chair transfers with minA and LRAD PT Short Term Goal 3 (Week 4): Pt will perform sit<>stand transfers with minA and LRAD PT Short Term Goal 4 (Week 4): Pt will propel himself 167ft in w/c with supervision  Skilled Therapeutic Interventions/Progress Updates:    Pt greeted supine in bed at start of session. Wife and interpreter at bedside. Pt agreeable to therapy session with no reports of pain. Asked wife for son to provide door width measurements and to begin search for bed frame, wife voiced understanding. Instructed wife on assisting with bed mobility without bed features (HOB flat and no bed rails), wife able to assist patient from supine to sit with therapist supervision and cues for technique. Wife then instructed on squat<>pivot transfer which she completed with therapist provided close supervision. Wife did an excellent job with blocking LLE and providing safe guarding. Reinforced to her to allow patient to complete as much as he can to prevent caregiver burnout and reduce her burden.   Pt propelled himself with minA in w/c (w/c cushion removed to allow lower access to ground) with hemi technique, continues to drift L and has difficulty correcting this. Primarily uses his RLE for propulsion > RUE.   Wife assisted with 2nd squat<>pivot transfer from w/c to mat table with similar cues as above - able to lock the brakes and remove leg rests without cues.   Pt performed repeated sit<>stands with L knee block with minA with cues for upright posture and reducing his L lateral lean. Pt then performed standing NMR with cone reaching with modA  for balance ; cones stacking to L to promote L visual scanning.  Wife assisted with 3rd squat<>pivot transfer from mat table to w/c with similar cues as above, continued to demonstrate appropriate guarding and L knee blocking.   Wheeled inside // bars to perform gait for NMR. Pt ambulated length of // bars (~60ft) x4 bouts (seated rest break) with modA and +2 assist for w/c follow. Pt using only RUE on // bar. Pt needing assist for facilitating L swing, L foot placement, and L knee block during stance. Pt with significant difficulty timing the sequence of gait and having trouble maintaining erect posture as he would collapse into forward trunk flexion. Cues throughout for corrections and sequencing.   Wheeled back to his room with totalA for time management.   Wife assisted with 4th squat<>pivot transfer from w/c to EOB - therapist again providing close supervision. She then also assisted with sit>supine with cues for technique from therapist.   Wife checked off on safety plan for transfers only and this was relayed to wife. She has consistently shown great safety technique and understanding with functional transfers.   Pt remained supine in bed at end of session with needs in reach and bed alarm on.   Therapy Documentation Precautions:  Precautions Precautions: Fall,Other (comment) Precaution Comments: L hemi, neglect Restrictions Weight Bearing Restrictions: No General:    Therapy/Group: Individual Therapy  Zali Kamaka P Saquan Furtick PT 05/06/2020, 7:45 AM

## 2020-05-06 NOTE — Progress Notes (Signed)
Occupational Therapy Session Note  Patient Details  Name: Anthony Huber MRN: [id number] Date of Birth: 19-Feb-1960  Today's Date: 05/06/2020 OT Individual Time: 0830-1000 OT Individual Time Calculation (min): 90 min    Short Term Goals: Week 4:  OT Short Term Goal 1 (Week 4): Pt will don a shirt with S demonstrating improved L side awareness. OT Short Term Goal 2 (Week 4): Pt will transfer to [facility] with CGA to min A. OT Short Term Goal 3 (Week 4): From [facility] pt will self cleanse with min A to support his balance.  Skilled Therapeutic Interventions/Progress Updates:    Pt received in bed with wife in the room. Pt agreeable to trying a shower today. Pt demonstrated excellent initiation today and was able to sit up to EOB, stand, transfer bed to wc, wc >< shower bench all with min A.   Min A UB bathing, mod LB but used grab bar for support 50% of the time for his sitting balance. No severe LOB.  Pt dressed from wc with max cues for hemidressing techniques.  Interpretor arrived at this point.     Pt taken to gym to work on LUE NMR with estim facilitation to forearm with saebo stim one. Pt transferred to mat and maintained sitting balance unsupported for 30 min with NO support!  Worked on grasp and release with estim of picking up plastic eggs.  Used estim for 15 min and then removed and only trace finger extension.    Saebo Stim One 330 pulse width 35 Hz pulse rate On 8 sec/ off 8 sec Ramp up/ down 2 sec Symmetrical Biphasic wave form  Max intensity 152m at 500 Ohm load  A/arom of L shoulder/ tricep on table.  Pt continues to gain motion of arm but it is a slow progression. He continues to have limited sensation and L side attention.  He needs frequent cues to attend to his L arm.  Pt tolerated estim and therapy well. Pt returned to the room with all needs met. Belt alarm on.     Therapy Documentation Precautions:  Precautions Precautions: Fall,Other (comment) Precaution Comments: L hemi,  neglect Restrictions Weight Bearing Restrictions: No      Pain: Pain Assessment Pain Score: 0-No pain ADL: ADL Grooming: Moderate assistance Upper Body Bathing: Minimal assistance Where Assessed-Upper Body Bathing: Shower Lower Body Bathing: Moderate assistance Where Assessed-Lower Body Bathing: Shower Upper Body Dressing: Moderate assistance Where Assessed-Upper Body Dressing:  (BSC) Lower Body Dressing: Maximal assistance Where Assessed-Lower Body Dressing: Other (Comment) (BSC) Toileting: Maximal assistance Where Assessed-Toileting: Bedside Commode Toilet Transfer: Moderate assistance Toilet Transfer Method: Stand pivot Toilet Transfer Equipment: Drop arm bGeophysical data processor Moderate assistance WSocial research officer, governmentMethod: SEducation officer, environmental Transfer tub bench,Grab bars   Therapy/Group: Individual Therapy  SSorrento5/02/2020, 12:22 PM

## 2020-05-06 NOTE — Progress Notes (Signed)
Nutrition Follow-up  DOCUMENTATION CODES:   Not applicable  INTERVENTION:   - Please obtain updated weight  - ContinueGlucerna Shake po TID, each supplement provides 220 kcal and 10 grams of protein  - Continue Magic Cup BID with meals, each supplement provides 290 kcal and 9 grams of protein  - Continue MVI with minerals daily  - Encourage adequate PO intake  NUTRITION DIAGNOSIS:   Increased nutrient needs related to chronic illness (CAD, DM) as evidenced by estimated needs.  Ongoing, being addressed via supplements  GOAL:   Patient will meet greater than or equal to 90% of their needs  Progressing  MONITOR:   PO intake,Supplement acceptance,Labs,Weight trends,Skin,I & O's  REASON FOR ASSESSMENT:   Consult Assessment of nutrition requirement/status  ASSESSMENT:   Anthony Huber is a 60 year old right-handed male with history of open ureterlithotomy 2005 in [place] as well as left PCNL and laser ureterotomy October 2018 per Dr. Louis Meckel, CAD, diabetes mellitus and hypertension with remote tobacco use.  Met with pt and wife briefly at bedside. Unable to communicate successfully with pt and wife as interpreter was not present.  PO intake has been excellent, averaging 93% over the last 8 meals. Most meal completions have been 100%. Pt accepting most Glucerna Shakes per [facility] documentation. Will continue with current oral nutrition supplement regimen and MVI.  Weight was down from 48.9 kg on 4/07 to 43.1 kg on 4/21 then trending back up to 44.9 kg on 4/27. No new weights since 4/27.  Meal Completion: 80-100% x last 8 documented meals (averaging 93%)  Medications reviewed and include: Glucerna TID, metformin, ritalin, MVI with minerals, protonix, miralax, senna  Labs reviewed.  Diet Order:   Diet Order            Diet Carb Modified Fluid consistency: Thin; Room service appropriate? Yes with Assist  Diet effective now                 EDUCATION NEEDS:    Education needs have been addressed  Skin:  Skin Assessment: Reviewed RN Assessment  Last BM:  05/05/20  Height:   Ht Readings from Last 1 Encounters:  04/11/20 4' 11" (1.499 m)    Weight:   Wt Readings from Last 1 Encounters:  05/01/20 44.9 kg    Ideal Body Weight:  44.5 kg  BMI:  Body mass index is 19.99 kg/m.  Estimated Nutritional Needs:   Kcal:  1450-1650  Protein:  65-80 grams  Fluid:  > 1.4 L    Gustavus Bryant, MS, RD, LDN Inpatient Clinical Dietitian Please see AMiON for contact information.

## 2020-05-07 NOTE — Progress Notes (Signed)
Patient ID: Anthony Huber, male   DOB: [birth year], 60 y.o.   MRN: [id number] Wheelchair evaluation today via [facility].

## 2020-05-07 NOTE — Progress Notes (Signed)
PROGRESS NOTE   Subjective/Complaints: Patient seen laying in bed this AM.  He slept well overnight.  Wife at bedside.    ROS: Limited due to language.  Objective:   No results found. No results for input(s): WBC, HGB, HCT, PLT in the last 72 hours. No results for input(s): NA, K, CL, CO2, GLUCOSE, BUN, CREATININE, CALCIUM in the last 72 hours.  Intake/Output Summary (Last 24 hours) at 05/07/2020 1028 Last data filed at 05/07/2020 0900 Gross per 24 hour  Intake 480 ml  Output --  Net 480 ml        Physical Exam: Vital Signs Blood pressure 125/76, pulse 79, temperature 97.9 F (36.6 C), resp. rate 18, height 4\' 11"  (1.499 m), weight 44.9 kg, SpO2 95 %.  Constitutional: No distress . Vital signs reviewed. HENT: Normocephalic.  Atraumatic. Eyes: EOMI. No discharge. Cardiovascular: No JVD.  RRR. Respiratory: Normal effort.  No stridor.  Bilateral clear to auscultation. GI: Non-distended.  BS +. Skin: Warm and dry.  Intact. Psych: Normal mood.  Normal behavior. Musc: No edema in extremities.  No tenderness in extremities. Neuro: Alert Left facial weakness LUE 1+/5, with significant apraxia, unchanged LLE: HF 2+/5, KE 3/5, ADF 1 /5 with apraxia No increase in tone noted   Assessment/Plan: 1. Functional deficits which require 3+ hours per day of interdisciplinary therapy in a comprehensive inpatient rehab setting.  Physiatrist is providing close team supervision and 24 hour management of active medical problems listed below.  Physiatrist and rehab team continue to assess barriers to discharge/monitor patient progress toward functional and medical goals  Care Tool:  Bathing    Body parts bathed by patient: Face,Chest,Abdomen,Left arm,Front perineal area,Right upper leg,Left upper leg,Right lower leg   Body parts bathed by helper: Right arm,Buttocks,Left lower leg     Bathing assist Assist Level: Moderate  Assistance - Patient 50 - 74%     Upper Body Dressing/Undressing Upper body dressing   What is the patient wearing?: Pull over shirt    Upper body assist Assist Level: Moderate Assistance - Patient 50 - 74%    Lower Body Dressing/Undressing Lower body dressing      What is the patient wearing?: Pants,Incontinence brief     Lower body assist Assist for lower body dressing: Maximal Assistance - Patient 25 - 49%     Toileting Toileting    Toileting assist Assist for toileting: Total Assistance - Patient < 25%     Transfers Chair/bed transfer  Transfers assist     Chair/bed transfer assist level: Minimal Assistance - Patient > 75%     Locomotion Ambulation   Ambulation assist   Ambulation activity did not occur: Safety/medical concerns  Assist level: 2 helpers (w/c follow) Assistive device: Other (comment) (hand rail) Max distance: 79ft   Walk 10 feet activity   Assist  Walk 10 feet activity did not occur: Safety/medical concerns  Assist level: 2 helpers (w/c follow) Assistive device: Other (comment) (hand rail)   Walk 50 feet activity   Assist Walk 50 feet with 2 turns activity did not occur: Safety/medical concerns         Walk 150 feet activity   Assist Walk  150 feet activity did not occur: Safety/medical concerns         Walk 10 feet on uneven surface  activity   Assist Walk 10 feet on uneven surfaces activity did not occur: Safety/medical concerns         Wheelchair     Assist Will patient use wheelchair at discharge?: Yes Type of Wheelchair: Manual Wheelchair activity did not occur: Safety/medical concerns         Wheelchair 50 feet with 2 turns activity    Assist    Wheelchair 50 feet with 2 turns activity did not occur: Safety/medical concerns       Wheelchair 150 feet activity     Assist  Wheelchair 150 feet activity did not occur: Safety/medical concerns       Medical Problem List and  Plan: 1.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 Impaired mobility -DVT/anticoagulation:Continue Subcutaneous heparin initiated for 02/25/2020 -antiplatelet therapy: N/A 3. Lower back pain:continue Tylenol as needed. D/c tramadol. Added kpad and lidocaine patch overnight 4. Mood:Provide emotional support -antipsychotic agents: N/A 5. Neuropsych: This patientis notcapable of making decisions on hisown behalf. 6. Skin/Wound Care:Routine skin checks 7. Fluids/Electrolytes/Nutrition:Routine in and outs. 8. Hypertension.              Continue Norvasc 10 mg daily             Lopressor 50 mg twice daily.              D/c'ed clonidine.              Controlled on 5/3 9. History of open Uretlithotomy2005 in [place] as well as left PCNL and laser ureterotomyOctober 2018 followed by Dr. Marlou Porch. 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  -CONTINUE FOLEY TUBE.DO NOT REMOVE .Foley catheter to remain in place until follow-up in the office of Dr. Modena Slater for cystogram. 10. Diet controlled diabetes mellitus. Hemoglobin A1c 5.8. SSI -Educate pt/family as needed             Metformin decreased back to 500mg  daily and restarted CBG checks. Continue metformin            Labile on 5/2 11.  Leukocytosis             WBCs 10.7 on 4/27 plan to order labs later this week             Afebrile             Recheck 4/19 12.  Acute blood loss anemia             Hemoglobin 12.1 on 4/27             Recheck 4/19 13.  CKD              Creatinine 1.07 on 4/27             Encourage fluids, discussed again 14.  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  Slow transit constipation             Bowel meds increased on 4/8             Improved 16. Lethargy:   Continue Ritalin 5mg  BID  Improving 17. Emesis              Appears to have resolved              Changed Lipitor to HS to spread out medications     LOS: 26 days A FACE TO FACE EVALUATION WAS PERFORMED  Velna Hedgecock 05/07/2020, 10:28 AM

## 2020-05-07 NOTE — Progress Notes (Signed)
Occupational Therapy Session Note  Patient Details  Name: Anthony Huber MRN: [id number] Date of Birth: 1960/06/12  Today's Date: 05/07/2020 OT Individual Time: 0900-1000 OT Individual Time Calculation (min): 60 min (+60 min of unattended estim)   Short Term Goals: Week 4:  OT Short Term Goal 1 (Week 4): Pt will don a shirt with S demonstrating improved L side awareness. OT Short Term Goal 2 (Week 4): Pt will transfer to [facility] with CGA to min A. OT Short Term Goal 3 (Week 4): From [facility] pt will self cleanse with min A to support his balance.  Skilled Therapeutic Interventions/Progress Updates:  -interpretor and wife present   Pt received in bed ready for therapy and agreeable to trying to toilet first.  Completed bed mobility and bed to wc min A.  Wc>< toilet with grab bar mod A.  Used rail on R side for support with sit to stand for clothing management of total A.  Max A to support L lateral lean while pt did self cleanse after having a BM.  Worked on dressing with hemidressing techniques.  Improved attention to L side, able to get shirt on with min A today.  Transported to gym to work on sitting balance, LUE NMR with a/arom rolling ball forward and back with max A,using hand skate board with max A.  Used estim on forearm for 10 min to facilitate finger and wrist extension. With all activities pt only needed min cues to attend to left arm. Much improved L tracking and focus on L arm today. Pt reports he has very dull sensation of arm.  Applied estim to L shoulder for cyclic estim to facilitate deltoid contraction for sh subluxation.  Instructed pt and wife to leave it on for 60 min and I would come to remove it.  Pt tolerated estim well and no skin redness when removed at 11am.   Saebo Stim One 330 pulse width 35 Hz pulse rate On 8 sec/ off 8 sec Ramp up/ down 2 sec Symmetrical Biphasic wave form  Max intensity at 500 Ohm load  Pt resting in wc with spouse in the room.   Therapy  Documentation Precautions:  Precautions Precautions: Fall,Other (comment) Precaution Comments: L hemi, neglect Restrictions Weight Bearing Restrictions: No       Pain: Pain Assessment Pain Scale: Faces Faces Pain Scale: No hurt ADL: ADL Grooming: Moderate assistance Upper Body Bathing: Minimal assistance Where Assessed-Upper Body Bathing: Shower Lower Body Bathing: Moderate assistance Where Assessed-Lower Body Bathing: Shower Upper Body Dressing: Moderate assistance Where Assessed-Upper Body Dressing:  (BSC) Lower Body Dressing: Maximal assistance Where Assessed-Lower Body Dressing: Other (Comment) (BSC) Toileting: Maximal assistance Where Assessed-Toileting: Bedside Commode Toilet Transfer: Moderate assistance Toilet Transfer Method: Stand pivot Toilet Transfer Equipment: Drop arm Psychiatric nurse: Moderate assistance Film/video editor Method: Administrator: Transfer tub bench,Grab bars   Therapy/Group: Individual Therapy  Anthony Huber 05/07/2020, 12:28 PM

## 2020-05-07 NOTE — Progress Notes (Signed)
Physical Therapy Session Note  Patient Details  Name: Isacc Turney MRN: [id number] Date of Birth: 1960-08-28  Today's Date: 05/07/2020 PT Individual Time: 1300-1353 PT Individual Time Calculation (min): 53 min   Short Term Goals: Week 4:  PT Short Term Goal 1 (Week 4): Pt will complete bed mobility with minA PT Short Term Goal 2 (Week 4): Pt will complete bed<>chair transfers with minA and LRAD PT Short Term Goal 3 (Week 4): Pt will perform sit<>stand transfers with minA and LRAD PT Short Term Goal 4 (Week 4): Pt will propel himself 144ft in w/c with supervision  Skilled Therapeutic Interventions/Progress Updates:    Pt greeted supine in bed at start of session, wife and interpreter at bedside. Focus of session to begin custom w/c evaluation with Barbara Cower from [facility]. Discussion with Barbara Cower regarding pt's current mobility status, sitting balance, w/c propulsion, and various needs to maximize pt's indep, skin protection, and comfort. Wife assisted with stand<>pivot transfer with therapist providing close supervision - wife demonstrates great safety awareness and ability to block L knee and perform transfer appropriately. Pt propelled himself with minA in w/c using hemi technique with R hemibody (minA for maintaining straight path due to L deviation). Wife then assisted with stand<>pivot transfer from w/c to mat table with similar technique as listed above. Jason ATP performed measurements with patient sitting unsupported on mat table - collaboartion with discussion on needs. Decided on K5 ultra lightweight w/c with padded 1/2 lap tray and super hemi height ~17 inches WITH cushion) and contoured back/cushion. POC discussed with wife and patient via interpreter and they voiced understanding.   Focused remainder of session on NMR in // bars. Gait 2x22ft with modA overall (+2 assist for w/c follow) with assist for advancing, stabilizing, and placing his L foot during gait and also assisting with trunk  control and upright posture. Performed repeated sit<>stands, 1x15, with L knee block and minA in // bars. Then performed static standing reaching (unsupported) with RUE and minA with L knee block via targets and cone reaching L<>R.  Emphasizing L visual gaze and L attention.  Pt propelled himself with minA back to his room via hemi technique (as listed above). Performed stand<>pivot transfer with minA from w/c to EOB with L knee block and cues for technique. Pt requesting to remain seated EOB at end of session. Reminded wife that she must be sitting next to him if he is sitting EOB and to NOT leave him unattended while seated EOB; she voiced understanding. Bed alarm on and needs within reach.  Therapy Documentation Precautions:  Precautions Precautions: Fall,Other (comment) Precaution Comments: L hemi, neglect Restrictions Weight Bearing Restrictions: No General:    Therapy/Group: Individual Therapy  Monigue Spraggins P Tahjai Schetter PT 05/07/2020, 7:35 AM

## 2020-05-07 NOTE — Progress Notes (Signed)
Speech Language Pathology Daily Session Note  Patient Details  Name: Anthony Huber MRN: [id number] Date of Birth: [birth year]  Today's Date: 05/07/2020 SLP Individual Time: 0160-1093 SLP Individual Time Calculation (min): 55 min  Short Term Goals: Week 4: SLP Short Term Goal 1 (Week 4): Patient will initiate during conversation and functional tasks with modA cues to perform consistently. SLP Short Term Goal 2 (Week 4): Patient will maintain attention to task/conversational topic with modA cues to redirect. SLP Short Term Goal 3 (Week 4): Patient will demonstrate functional problem solving for basic level tasks with minA cues. SLP Short Term Goal 4 (Week 4): Patient will initiate and maintain adequate eye contact with speaker when on right side visual field with modA cues.  Skilled Therapeutic Interventions: Skilled SLP intervention focused on cognition. Difficulty with comprehension of tasks this session due language barrier with interpretor. Sustained attention task with photos completed with min A verbal cues. He initiated conversation to describe photos with mod A with verbal direction. Pt maintained eye contact with therapist on left side with occasional verbal cues from SLP and wife.  Cont with therapy per plan of care.      Pain Pain Assessment Pain Scale: Faces Faces Pain Scale: No hurt  Therapy/Group: Individual Therapy  Anthony Huber Anthony Huber 05/07/2020, 8:46 AM

## 2020-05-08 NOTE — Plan of Care (Signed)
  Problem: RH Balance Goal: LTG: Patient will maintain dynamic sitting balance (OT) Description: LTG:  Patient will maintain dynamic sitting balance with assistance during activities of daily living (OT) Flowsheets (Taken 05/08/2020 1127) LTG: Pt will maintain dynamic sitting balance during ADLs with: (LTG upgraded due to excellent progress.) Contact Guard/Touching assist Note: LTG upgraded due to excellent progress.   Problem: RH Grooming Goal: LTG Patient will perform grooming w/assist,cues/equip (OT) Description: LTG: Patient will perform grooming with assist, with/without cues using equipment (OT) Flowsheets (Taken 05/08/2020 1127) LTG: Pt will perform grooming with assistance level of: (LTG upgraded due to excellent progress.) Set up assist  Note: LTG upgraded due to excellent progress.   Problem: RH Bathing Goal: LTG Patient will bathe all body parts with assist levels (OT) Description: LTG: Patient will bathe all body parts with assist levels (OT) Flowsheets (Taken 05/08/2020 1127) LTG: Pt will perform bathing with assistance level/cueing: (LTG upgraded due to excellent progress.) Minimal Assistance - Patient > 75% Note: LTG upgraded due to excellent progress.   Problem: RH Dressing Goal: LTG Patient will perform upper body dressing (OT) Description: LTG Patient will perform upper body dressing with assist, with/without cues (OT). Flowsheets (Taken 05/08/2020 1127) LTG: Pt will perform upper body dressing with assistance level of: (LTG upgraded due to excellent progress.) Contact Guard/Touching assist Note: LTG upgraded due to excellent progress.   Problem: RH Toileting Goal: LTG Patient will perform toileting task (3/3 steps) with assistance level (OT) Description: LTG: Patient will perform toileting task (3/3 steps) with assistance level (OT)  Flowsheets (Taken 05/08/2020 1127) LTG: Pt will perform toileting task (3/3 steps) with assistance level: (LTG upgraded due to excellent  progress.) Moderate Assistance - Patient 50 - 74% Note: LTG upgraded due to excellent progress.   Problem: RH Toilet Transfers Goal: LTG Patient will perform toilet transfers w/assist (OT) Description: LTG: Patient will perform toilet transfers with assist, with/without cues using equipment (OT) Flowsheets (Taken 05/08/2020 1127) LTG: Pt will perform toilet transfers with assistance level of: Minimal Assistance - Patient > 75%   Problem: RH Tub/Shower Transfers Goal: LTG Patient will perform tub/shower transfers w/assist (OT) Description: LTG: Patient will perform tub/shower transfers with assist, with/without cues using equipment (OT) Flowsheets (Taken 05/08/2020 1127) LTG: Pt will perform tub/shower stall transfers with assistance level of: Minimal Assistance - Patient > 75%   Problem: RH Memory Goal: LTG Patient will demonstrate ability for day to day recall/carry over during activities of daily living with assistance level (OT) Description: LTG:  Patient will demonstrate ability for day to day recall/carry over during activities of daily living with assistance level (OT). Flowsheets (Taken 05/08/2020 1127) LTG:  Patient will demonstrate ability for day to day recall/carry over during activities of daily living with assistance level (OT): (LTG upgraded due to excellent progress.) Minimal Assistance - Patient > 75% Note: LTG upgraded due to excellent progress.   Problem: RH Dressing Goal: LTG Patient will perform lower body dressing w/assist (OT) Description: LTG: Patient will perform lower body dressing with assist, with/without cues in positioning using equipment (OT) Flowsheets (Taken 05/08/2020 1127) LTG: Pt will perform lower body dressing with assistance level of: Moderate Assistance - Patient 50 - 74%

## 2020-05-08 NOTE — Progress Notes (Signed)
PROGRESS NOTE   Subjective/Complaints: Patient seen sitting up at the edge of his bed working with therapies.  Wife at bedside.  Patient states he slept well overnight.  He denies dizziness or knee pain.  ROS: Limited due to language.   Objective:   No results found. No results for input(s): WBC, HGB, HCT, PLT in the last 72 hours. No results for input(s): NA, K, CL, CO2, GLUCOSE, BUN, CREATININE, CALCIUM in the last 72 hours.  Intake/Output Summary (Last 24 hours) at 05/08/2020 1044 Last data filed at 05/08/2020 0830 Gross per 24 hour  Intake 600 ml  Output 655 ml  Net -55 ml        Physical Exam: Vital Signs Blood pressure 124/83, pulse 76, temperature (!) 97.4 F (36.3 C), temperature source Oral, resp. rate 17, height 4\' 11"  (1.499 m), weight 44.9 kg, SpO2 96 %.  Constitutional: No distress . Vital signs reviewed. HENT: Normocephalic.  Atraumatic. Eyes: EOMI. No discharge. Cardiovascular: No JVD.  RRR. Respiratory: Normal effort.  No stridor.  Bilateral clear to auscultation. GI: Non-distended.  BS +. Skin: Warm and dry.  Intact. Psych: Normal mood.  Normal behavior. Musc: No edema in extremities.  No tenderness in extremities. Neuro: Alert Left facial weakness, unchanged Left gaze preference LUE 1+/5, with significant apraxia, stable LLE: HF 2+/5, KE 3/5, ADF 1 /5 with apraxia No increase in tone noted   Assessment/Plan: 1. Functional deficits which require 3+ hours per day of interdisciplinary therapy in a comprehensive inpatient rehab setting.  Physiatrist is providing close team supervision and 24 hour management of active medical problems listed below.  Physiatrist and rehab team continue to assess barriers to discharge/monitor patient progress toward functional and medical goals  Care Tool:  Bathing    Body parts bathed by patient: Face,Chest,Abdomen,Left arm,Front perineal area,Right upper leg,Left  upper leg,Right lower leg   Body parts bathed by helper: Right arm,Buttocks,Left lower leg     Bathing assist Assist Level: Moderate Assistance - Patient 50 - 74%     Upper Body Dressing/Undressing Upper body dressing   What is the patient wearing?: Pull over shirt    Upper body assist Assist Level: Minimal Assistance - Patient > 75%    Lower Body Dressing/Undressing Lower body dressing      What is the patient wearing?: Pants,Incontinence brief     Lower body assist Assist for lower body dressing: Maximal Assistance - Patient 25 - 49%     Toileting Toileting    Toileting assist Assist for toileting: Maximal Assistance - Patient 25 - 49%     Transfers Chair/bed transfer  Transfers assist     Chair/bed transfer assist level: Minimal Assistance - Patient > 75%     Locomotion Ambulation   Ambulation assist   Ambulation activity did not occur: Safety/medical concerns  Assist level: 2 helpers (w/c follow) Assistive device: Other (comment) (hand rail) Max distance: 61ft   Walk 10 feet activity   Assist  Walk 10 feet activity did not occur: Safety/medical concerns  Assist level: 2 helpers (w/c follow) Assistive device: Other (comment) (hand rail)   Walk 50 feet activity   Assist Walk 50 feet with 2  turns activity did not occur: Safety/medical concerns         Walk 150 feet activity   Assist Walk 150 feet activity did not occur: Safety/medical concerns         Walk 10 feet on uneven surface  activity   Assist Walk 10 feet on uneven surfaces activity did not occur: Safety/medical concerns         Wheelchair     Assist Will patient use wheelchair at discharge?: Yes Type of Wheelchair: Manual Wheelchair activity did not occur: Safety/medical concerns         Wheelchair 50 feet with 2 turns activity    Assist    Wheelchair 50 feet with 2 turns activity did not occur: Safety/medical concerns       Wheelchair 150 feet  activity     Assist  Wheelchair 150 feet activity did not occur: Safety/medical concerns       Medical Problem List and Plan: 1.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 Please see conference note from today as well.  2. Impaired mobility -DVT/anticoagulation:Continue Subcutaneous heparin initiated for 02/25/2020 -antiplatelet therapy: N/A 3. Lower back pain:continue Tylenol as needed. D/c tramadol. Added kpad and lidocaine patch overnight 4. Mood:Provide emotional support -antipsychotic agents: N/A 5. Neuropsych: This patientis notcapable of making decisions on hisown behalf. 6. Skin/Wound Care:Routine skin checks 7. Fluids/Electrolytes/Nutrition:Routine in and outs. 8. Hypertension.              Continue Norvasc 10 mg daily             Lopressor 50 mg twice daily.              D/c'ed clonidine.              Controlled on 5/4 9. History of open Uretlithotomy2005 in [place] as well as left PCNL and laser ureterotomyOctober 2018 followed by Dr. Marlou Porch. 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  -CONTINUE FOLEY TUBE.DO NOT REMOVE .Foley catheter to remain in place until follow-up in the office of Dr. Modena Slater for cystogram. 10. Diet controlled diabetes mellitus. Hemoglobin A1c 5.8. SSI -Educate pt/family as needed             Metformin decreased back to 500mg  daily and restarted CBG checks. Continue metformin            Labile on 5/2, plan to order labs later this week 11.  Leukocytosis             WBCs 10.7 on  4/27 plan to order labs later this week             Afebrile             Recheck 4/19 12.  Acute blood loss anemia             Hemoglobin 12.1 on 4/27             Recheck 4/19 13.  CKD              Creatinine  1.07 on 4/27             Encourage fluids, discussed again 14.  Gout and OA- in right knee             X-ray ordered showing mild degenerative changes with small effusion             Voltaren gel             Colchicine restarted             Improved 15.  Slow transit constipation             Bowel meds increased on 4/8             Improved 16. Lethargy:   Continue Ritalin 5mg  BID  Improving 17. Emesis              Appears to have resolved              Changed Lipitor to HS to spread out medications     LOS: 27 days A FACE TO FACE EVALUATION WAS PERFORMED  Anthony Huber 05/08/2020, 10:44 AM

## 2020-05-08 NOTE — Progress Notes (Signed)
Speech Language Pathology Daily Session Note  Patient Details  Name: Anthony Huber MRN: [id number] Date of Birth: Jun 01, 1960  Today's Date: 05/08/2020 SLP Individual Time: 0810-0857 SLP Individual Time Calculation (min): 47 min  Short Term Goals: Week 4: SLP Short Term Goal 1 (Week 4): Patient will initiate during conversation and functional tasks with modA cues to perform consistently. SLP Short Term Goal 2 (Week 4): Patient will maintain attention to task/conversational topic with modA cues to redirect. SLP Short Term Goal 3 (Week 4): Patient will demonstrate functional problem solving for basic level tasks with minA cues. SLP Short Term Goal 4 (Week 4): Patient will initiate and maintain adequate eye contact with speaker when on right side visual field with modA cues.  Skilled Therapeutic Interventions:Skilled SLP intervention focused on cognition. Session started late due to language barrier and difficulty connecting to an interpreter using ipad. Session completed without interpretor session due to language being unavailable on ipad system. Pt completed visual scanning task to locate cards with symbol specified by SLP with mod A visual cues for cards in left visual field. Pt required increased time with task and needed visual cues to locate correct symbol. Cues faded to min A as task continued. Con with therapy per plan of care.      Pain Pain Assessment Pain Scale: Faces Faces Pain Scale: No hurt  Therapy/Group: Individual Therapy  Carlean Jews Anthony Huber 05/08/2020, 8:58 AM

## 2020-05-08 NOTE — Progress Notes (Signed)
Occupational Therapy Session Note  Patient Details  Name: Anthony Huber MRN: [id number] Date of Birth: 13-Oct-1960  Today's Date: 05/08/2020 OT Individual Time: 0900-1000 OT Individual Time Calculation (min): 60 min    Short Term Goals: Week 4:  OT Short Term Goal 1 (Week 4): Pt will don a shirt with S demonstrating improved L side awareness. OT Short Term Goal 2 (Week 4): Pt will transfer to [facility] with CGA to min A. OT Short Term Goal 3 (Week 4): From [facility] pt will self cleanse with min A to support his balance.  Skilled Therapeutic Interventions/Progress Updates:    pt received sitting EOB with wife. Interpretor present.  Pt opted to not shower today as he thought it was too cold.  - Pt worked on doffing old clothes and donning new ones while maintaining sit balance with min A with doffing donning pants over feet.   - able to remove shirt with only min cues and don shirt with set up/CGA and mod cues.   -sit to stand with CGA with R hand support and then mod A to stabilize balance as pt adjusted clothing over hips min A. -CGA stand pivot to wc.  -pt worked on Presenter, broadcasting with cue to turn head like a lighthouse to find obstacles on his R. Pt did better with this technique.  -in gym, pt placed at // bars for static standing and integration of LUE a/arom.  He needs A through L knee for leg extension.  Pt had more upright midline posture today and only needed min to support balance. -in sitting worked on LUE a/arom with sliding hand up and down angled board, finger flexion, and pulling back on a bar.    -estim to forearm for 10 min for finger extension and then unattended estim 60 on L shoulder for deltoids/subluxation for cyclic estim. Pt tolerated it well.  Saebo Stim One 330 pulse width 35 Hz pulse rate On 8 sec/ off 8 sec Ramp up/ down 2 sec Symmetrical Biphasic wave form  Max intensity 196m at 500 Ohm load  Pt returned to room with wife. All needs met. Safety belt on.    Therapy  Documentation Precautions:  Precautions Precautions: Fall,Other (comment) Precaution Comments: L hemi, neglect Restrictions Weight Bearing Restrictions: No      Pain: Pain Assessment Pain Scale: Faces Pain Score: 0-No pain Faces Pain Scale: No hurt ADL: ADL Grooming: Moderate assistance Upper Body Bathing: Minimal assistance Where Assessed-Upper Body Bathing: Shower Lower Body Bathing: Moderate assistance Where Assessed-Lower Body Bathing: Shower Upper Body Dressing: Contact guard Where Assessed-Upper Body Dressing: Edge of bed Lower Body Dressing: Moderate assistance Where Assessed-Lower Body Dressing: Edge of bed Toileting: Moderate assistance Where Assessed-Toileting: TGlass blower/designer Moderate assistance Toilet Transfer Method: Stand pivot Toilet Transfer Equipment: GEnergy manager Moderate assistance WSocial research officer, governmentMethod: SEducation officer, environmental Transfer tub bench,Grab bars   Therapy/Group: Individual Therapy  SLime Ridge5/04/2020, 11:10 AM

## 2020-05-08 NOTE — Progress Notes (Signed)
Physical Therapy Session Note  Patient Details  Name: Anthony Huber MRN: [id number] Date of Birth: September 02, 1960  Today's Date: 05/08/2020 PT Individual Time: 1030-1058 + [id number] PT Individual Time Calculation (min): 28 min + 72 min  Short Term Goals: Week 4:  PT Short Term Goal 1 (Week 4): Pt will complete bed mobility with minA PT Short Term Goal 2 (Week 4): Pt will complete bed<>chair transfers with minA and LRAD PT Short Term Goal 3 (Week 4): Pt will perform sit<>stand transfers with minA and LRAD PT Short Term Goal 4 (Week 4): Pt will propel himself 166ft in w/c with supervision  Skilled Therapeutic Interventions/Progress Updates:   1st session:   Pt greeted seated in w/c, actively working on LUE arm exercises on 1/2 lap tray with towel roll. Wife at bedside. Pt denies pain. Unscheduled therapy session. Focused session on LLE NMR with NMES via EMPI select.   Applied NMES pads to anterior tibialis muscle group to facilitate ankle DF. Tolerated well with 2 second ramp up with frequency set to 60hz . Able to initiate active DF to neutral and pt encouraged to participate with AAROM during this.  Applies NMES pads to quads and VMO muscle group to facilitate L knee extension. Tolerated well with 2 second ramp up with frequency also set to 60hz . Able to achieve full knee extension but this fluctuated with fatigue - again, encouraging AAROM during this.   Skin c/d/i after treatment. Pt remained seated in w/c with 1/2 lap tray in place, safety belt alarm on, needs within reach, wife at bedside.  2nd session: Pt greeted supine in bed at start of session, wife and interpreter at bedside. Pt reports no pain. Supine<>sit with minA with use of hospital bed features. Donned shoes with totalA for time management. Stand<>pivot with minA and L knee block from EOB to w/c. Able to reposition in w/c with CGA. Pt propelled himself to/from dayroom gym, ~140ft, with CGA/minA while using R hemibody. Continues to veer  L during w/c propulsion requiring minA for those corrections.  Focused remainder of session on NMR with LiteGait system over treadmill.  Instructed patient and wife on purpose of LiteGait for NMR and gait purposes. Also educated them on safety features. Donned small sized harness with totalA while pt sat in w/c. Able to stand from w/c with minA to LiteGait with L knee block. Pt needing min/modA for stepping up onto and off of the Treadmill. Performed the following gait trials with seated rest breaks b/w sets:  Trial 1: 0.1 mph, 40ft, 65f - partial weightbearing with offloading - Ankle DF ace wrap assist. Cues for upright posture and needing totalA to facilitate terminal L knee extension. MaxA for facilitating LLE swing/advancement/placement.   Trial 2: 0.2 mph, 4ft, 12 seconds - partial weightbearing with offloading -Removed ankle DF ace wrap assist due to concern for cause of L knee flexion moment. Pt with improved ability to achieve terminal knee extension but increased difficulty clearing L foot during swing.   Trial 3: 0.3 mph, 113ft, 8 min - partial weightbearing with offloading -With increased gait speed, demo's improved and more "fluid" gait cycle. Continues to require maxA for LLE management.  Trial 4: 0.4 mph, 345ft, 12 min - partial weightbearing with offloading - Pt with impaired stepping sequencing and stepping too early with RLE. Offloading harness allowed him to sometimes "hop" when cadence interrupted. Wife assisting with cues but sometimes this would cause overloaded stimulus and too much instruction.  Completed stand<>pivot transfer with minA  back to his bed - needed cues for safety awareness due to mild impulsivity during transfer. Needing minA for sit>supine for BLE management. Able to pull himself up in the bed with use of bedrail. Remained supine in bed with bed alarm on, wife at bedside, needs within reach.  Therapy Documentation Precautions:   Precautions Precautions: Fall,Other (comment) Precaution Comments: L hemi, neglect Restrictions Weight Bearing Restrictions: No  Therapy/Group: Individual Therapy  Marci Polito P Faizah Kandler PT 05/08/2020, 7:38 AM

## 2020-05-08 NOTE — Patient Care Conference (Signed)
Inpatient RehabilitationTeam Conference and Plan of Care Update Date: 05/08/2020   Time: 11:17 AM    Patient Name: Anthony Huber      Medical Record Number: [id number]  Date of Birth: 1960-08-16 Sex: Male         Room/Bed: 4W20C/4W20C-01 Payor Info: Payor: [employer]UE [place] / Plan: BCBS COMM PPO / Product Type: *No Product type* /    Admit Date/Time:  04/11/2020  2:09 PM  Primary Diagnosis:  ICH (intracerebral hemorrhage) [facility])  Hospital Problems: Principal Problem:   ICH (intracerebral hemorrhage) (HCC) Active Problems:   Slow transit constipation   Chronic gout of right knee   Acute blood loss anemia   Leukocytosis   Controlled type 2 diabetes mellitus with hyperglycemia, without long-term current use of insulin (HCC)   Essential hypertension   AKI (acute kidney injury) ([place])   Benign essential HTN    Expected Discharge Date: Expected Discharge Date: 05/17/20  Team Members Present: Physician leading conference: Dr. Delice Lesch Care Coodinator Present: Dorien Chihuahua, RN, BSN, CRRN;Becky Dupree, LCSW Nurse Present: Dorien Chihuahua, RN PT Present: Ginnie Smart, PT OT Present: Meriel Pica, OT SLP Present: Charolett Bumpers, SLP PPS Coordinator present : Gunnar Fusi, SLP     Current Status/Progress Goal Weekly Team Focus  Bowel/Bladder             Swallow/Nutrition/ Hydration   supervision A  Supervision A - goal met      ADL's   mod A overall with self care with improved transfers of mod A to toilet and tub bench.  much improved postural control. developing LUE motor activity.  min - mod A overall  LUE NMR, estim, balance, functional mobility, ADL training, pt/fam education   Mobility   minA bed mobility, minA to modA for squat<>pivot transfers. Ambulating in // bars wtih maxA. Sitting balance CGA.  Goals have been upgraded to minA. Will be w/c level  Dynamic sitting balance, functional transfers, family and pt education, DC planning, w/c eval complete and loaner  being delivered by end of week.   Communication   mod-min A  Min A  pragmatics and expression at sentence level   Safety/Cognition/ Behavioral Observations  mod-min A  min A  right attention, problem solving   Pain             Skin               Discharge Planning:  Wife continues to be here daily and particiapting in his therapies, son was here over the weekend and saw him in therapies and see's his current level.   Team Discussion: BP still fluctuates. Gait is better. Son came in for education over the weekend. Wife involved in care daily.  Patient on target to meet rehab goals: yes, min assist for upper body care and mod assist for lower body care. Min assist for squat pivot transfers. Min assist for w/c mobility. Able to ambulate in parallel bars.  *See Care Plan and progress notes for long and short-term goals.   Revisions to Treatment Plan:  W/C eval; loaner chair trials SLP working on cognition for problem solving and expression at a sentence level Teaching Needs:  Medication management, transfers, toileting, foley care/pericare, etc.   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  Barriers to Discharge: Medical stability;Other (comments)  Barriers to Discharge Comments: Foley, left neglect Possible Resolutions to Barriers/Weekly Focus: Therapies, follow labs - Hb, Cr, WBCs, follow CBGs   Continued Need for Acute Rehabilitation Level of Care: The patient requires daily medical management by a physician with specialized training in physical medicine and rehabilitation for the following reasons: Direction of a multidisciplinary physical rehabilitation program to maximize functional independence :  Yes Medical management of patient stability for increased activity during participation in an intensive rehabilitation regime.: Yes Analysis of laboratory values and/or radiology reports with any subsequent need for medication adjustment and/or medical intervention. : Yes   I attest that I was present, lead the team conference, and concur with the assessment and plan of the team.   Dorien Chihuahua B 05/08/2020, 2:08 PM

## 2020-05-08 NOTE — Progress Notes (Addendum)
Patient ID: Anthony Huber, male   DOB: July 14, 1960, 60 y.o.   MRN: [id number]  Met with pt and wife with interpreter to update regarding team conference progress toward his goals of min-mod assist. Target discharge still 5/13. Attempted to leave a message with son but his VM is not set up. Work toward discharge end of next week.  2:55 PM Did speak with son to give team conference update. He vliced the measurement into bathroom door is 25 and to tub 22. Will let PT know this. He feels he can lift Dad into tub.

## 2020-05-09 NOTE — Progress Notes (Signed)
Speech Language Pathology Daily Session Note  Patient Details  Name: Anthony Huber MRN: [id number] Date of Birth: [birth year]  Today's Date: 05/09/2020 SLP Individual Time: 8850-2774 SLP Individual Time Calculation (min): 40 min  Short Term Goals: Week 1: SLP Short Term Goal 1 (Week 1): Patient will maintain attention to basic level function tasks for increments of 1-2 minutes with modA cues SLP Short Term Goal 1 - Progress (Week 1): Met SLP Short Term Goal 2 (Week 1): Patient will perform basic level functional tasks with maxA cues for initiation. SLP Short Term Goal 2 - Progress (Week 1): Met SLP Short Term Goal 3 (Week 1): Patient will respond to open ended questions at least 75% of the time with modA cues. SLP Short Term Goal 3 - Progress (Week 1): Met SLP Short Term Goal 4 (Week 1): Patient will tolerate regular solids, thin liquids diet without overt s/s aspiration or penetration. SLP Short Term Goal 4 - Progress (Week 1): Partly met  Skilled Therapeutic Interventions:   Patient seen for skilled ST session with wife in room. Live interpreter was not present and SLP no video or audio interpreters available through [employer] service. SLP directed patient to complete tasks with visual and gestural cues. He was able to slide up in bed with supervision A cues and SLP assisting on his left side. He then completed task of connecting pvc pipes of varying sizes and types to complete design depicted in picture. Initially, he required modA cues to perform; monitor and correct errors; problem solve placing pieces, etc. He improved to be able to perform with supervision to minA cues with his wife assisting. After setup A, patient able to then brush teeth at supervision to mod I. Patient continues to benefit from skilled SLP intervention to maximize cognitive-linguistic abilities prior to discharge.  Pain Pain Assessment Pain Scale: Faces Faces Pain Scale: No hurt  Therapy/Group: Individual Therapy   Sonia Baller, MA, CCC-SLP Speech Therapy

## 2020-05-09 NOTE — Progress Notes (Signed)
PROGRESS NOTE   Subjective/Complaints: Patient seen laying in bed this morning, working with therapies.  Wife at bedside.  They indicate patient slept well overnight.  Discussed cognitive improvement with therapies.  ROS: Limited due to language.  Denies dizziness, right knee pain.  Objective:   No results found. No results for input(s): WBC, HGB, HCT, PLT in the last 72 hours. No results for input(s): NA, K, CL, CO2, GLUCOSE, BUN, CREATININE, CALCIUM in the last 72 hours.  Intake/Output Summary (Last 24 hours) at 05/09/2020 0934 Last data filed at 05/09/2020 0848 Gross per 24 hour  Intake 440 ml  Output 800 ml  Net -360 ml        Physical Exam: Vital Signs Blood pressure 126/85, pulse 77, temperature 99.2 F (37.3 C), temperature source Oral, resp. rate 16, height 4\' 11"  (1.499 m), weight 44.1 kg, SpO2 97 %.  Constitutional: No distress . Vital signs reviewed. HENT: Normocephalic.  Atraumatic. Eyes: EOMI. No discharge. Cardiovascular: No JVD.  RRR. Respiratory: Normal effort.  No stridor.  Bilateral clear to auscultation. GI: Non-distended.  BS +. Skin: Warm and dry.  Intact. Psych: Normal mood.  Normal behavior. Musc: No edema in extremities.  No tenderness in extremities. Neuro: Alert Left facial weakness, unchanged Left gaze preference, unchanged LUE 1+/5 proximal to distal with significant apraxia LLE: HF 2+/5, KE 3/5, ADF 1 /5 with apraxia No increase in tone noted   Assessment/Plan: 1. Functional deficits which require 3+ hours per day of interdisciplinary therapy in a comprehensive inpatient rehab setting.  Physiatrist is providing close team supervision and 24 hour management of active medical problems listed below.  Physiatrist and rehab team continue to assess barriers to discharge/monitor patient progress toward functional and medical goals  Care Tool:  Bathing    Body parts bathed by patient:  Face,Chest,Abdomen,Left arm,Front perineal area,Right upper leg,Left upper leg,Right lower leg   Body parts bathed by helper: Right arm,Buttocks,Left lower leg     Bathing assist Assist Level: Moderate Assistance - Patient 50 - 74%     Upper Body Dressing/Undressing Upper body dressing   What is the patient wearing?: Pull over shirt    Upper body assist Assist Level: Contact Guard/Touching assist    Lower Body Dressing/Undressing Lower body dressing      What is the patient wearing?: Pants     Lower body assist Assist for lower body dressing: Moderate Assistance - Patient 50 - 74%     Toileting Toileting    Toileting assist Assist for toileting: Maximal Assistance - Patient 25 - 49%     Transfers Chair/bed transfer  Transfers assist     Chair/bed transfer assist level: Contact Guard/Touching assist     Locomotion Ambulation   Ambulation assist   Ambulation activity did not occur: Safety/medical concerns  Assist level: 2 helpers (w/c follow) Assistive device: Other (comment) (hand rail) Max distance: 12ft   Walk 10 feet activity   Assist  Walk 10 feet activity did not occur: Safety/medical concerns  Assist level: 2 helpers (w/c follow) Assistive device: Other (comment) (hand rail)   Walk 50 feet activity   Assist Walk 50 feet with 2 turns activity did not  occur: Safety/medical concerns         Walk 150 feet activity   Assist Walk 150 feet activity did not occur: Safety/medical concerns         Walk 10 feet on uneven surface  activity   Assist Walk 10 feet on uneven surfaces activity did not occur: Safety/medical concerns         Wheelchair     Assist Will patient use wheelchair at discharge?: Yes Type of Wheelchair: Manual Wheelchair activity did not occur: Safety/medical concerns         Wheelchair 50 feet with 2 turns activity    Assist    Wheelchair 50 feet with 2 turns activity did not occur: Safety/medical  concerns       Wheelchair 150 feet activity     Assist  Wheelchair 150 feet activity did not occur: Safety/medical concerns       Medical Problem List and Plan: 1.Left hemiparesissecondary to right basal ganglia intraparenchymal hemorrhage secondary to hypertensive crisis             continue CIR             WHO/PRAFO nightly 2. Impaired mobility -DVT/anticoagulation:Continue Subcutaneous heparin initiated for 02/25/2020 -antiplatelet therapy: N/A 3. Lower back pain:continue Tylenol as needed. D/c tramadol. Added kpad and lidocaine patch overnight 4. Mood:Provide emotional support -antipsychotic agents: N/A 5. Neuropsych: This patientis notcapable of making decisions on hisown behalf. 6. Skin/Wound Care:Routine skin checks 7. Fluids/Electrolytes/Nutrition:Routine in and outs. 8. Hypertension.              Continue Norvasc 10 mg daily             Lopressor 50 mg twice daily.              D/c'ed clonidine.              Controlled on 5/5 9. History of open Uretlithotomy2005 in [place] as well as left PCNL and laser ureterotomyOctober 2018 followed by Dr. Marlou Porch. 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  -CONTINUE FOLEY TUBE.DO NOT REMOVE .Foley catheter to remain in place until follow-up in the office of Dr. Modena Slater for cystogram. 10. Diet controlled diabetes mellitus. Hemoglobin A1c 5.8. SSI -Educate pt/family as needed             Metformin decreased back to 500mg  daily and restarted CBG checks. Continue metformin            Labile on 5/2, labs ordered for tomorrow 11.  Leukocytosis             WBCs 10.7 on 4/27, labs ordered for tomorrow             Afebrile             Recheck 4/19 12.  Acute blood loss anemia             Hemoglobin 12.1 on 4/27, labs ordered for tomorrow             Recheck 4/19 13.  CKD               Creatinine 1.07 on 4/27, labs are for tomorrow             Encourage fluids, discussed again 14.  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  Slow transit constipation             Bowel meds increased on 4/8             Improved 16. Lethargy:   Continue Ritalin 5mg  BID, will plan on weaning  Improving 17. Emesis              Appears to have resolved              Changed Lipitor to HS to spread out medications     LOS: 28 days A FACE TO FACE EVALUATION WAS PERFORMED  Naba Sneed 05/09/2020, 9:34 AM

## 2020-05-09 NOTE — Progress Notes (Signed)
Physical Therapy Weekly Progress Note  Patient Details  Name: Anthony Huber MRN: [id number] Date of Birth: 13-Nov-1960  Beginning of progress report period: May 02, 2020 End of progress report period: May 09, 2020  Patient has met 3 of 4 short term goals. Pt has made excellent progress over the past week. He is able to complete bed mobility with minA (needs extra time for processing and initiation at times), is able to immediately sit EOB with supervision, consistently performs bed<>chair transfers with minA via squat pivot and L knee block, can rise to standing from seated position with minA and L knee block, and can propel himself >225f with minA in w/c via hemi technique. He has ambulated 3522fon the LiteGait over treadmill with partial body weight support and maxA for LLE facilitation. Family education has been ongoing during therapies as wife is consistently present everyday and she has shown great ability to safely transfer the patient from bed<>chair and assist with bed mobility.  Patient continues to demonstrate the following deficits muscle weakness, decreased cardiorespiratoy endurance, impaired timing and sequencing, abnormal tone, unbalanced muscle activation, motor apraxia, decreased coordination and decreased motor planning, field cut, decreased attention, decreased awareness, decreased problem solving, decreased safety awareness and delayed processing and decreased sitting balance, decreased standing balance, decreased postural control, hemiplegia and decreased balance strategies and therefore will continue to benefit from skilled PT intervention to increase functional independence with mobility.  Patient progressing toward long term goals.  Continue plan of care.  PT Short Term Goals Week 4:  PT Short Term Goal 1 (Week 4): Pt will complete bed mobility with minA PT Short Term Goal 1 - Progress (Week 4): Met PT Short Term Goal 2 (Week 4): Pt will complete bed<>chair transfers with minA  and LRAD PT Short Term Goal 2 - Progress (Week 4): Met PT Short Term Goal 3 (Week 4): Pt will perform sit<>stand transfers with minA and LRAD PT Short Term Goal 3 - Progress (Week 4): Met PT Short Term Goal 4 (Week 4): Pt will propel himself [id number] w/c with supervision PT Short Term Goal 4 - Progress (Week 4): Not met (Requires minA due to L path deviation) Week 5:  PT Short Term Goal 1 (Week 5): STG = LTG due to ELOS  Therapy Documentation Precautions:  Precautions Precautions: Fall,Other (comment) Precaution Comments: L hemi, neglect Restrictions Weight Bearing Restrictions: No   [place] Natarajan P Mackson Botz PT 05/09/2020, 7:37 AM

## 2020-05-09 NOTE — Progress Notes (Signed)
Physical Therapy Session Note  Patient Details  Name: Anthony Huber MRN: [id number] Date of Birth: [birth year]  Today's Date: 05/09/2020 PT Individual Time: 4174-0814 PT Individual Time Calculation (min): 46 min   Short Term Goals: Week 5:  PT Short Term Goal 1 (Week 5): STG = LTG due to ELOS  Skilled Therapeutic Interventions/Progress Updates:    Pt received supine in bed with his wife present. In-person interpreter, Luberta Robertson, present throughout session. Supine>sitting R EOB, HOB flat but using bedrail, with close supervision for safety. Sitting EOB donned shoes max assist for time management - able to maintain sitting balance during this with supervision. L squat pivot EOB>w/c min assist for balance while pivoting and guarding L LE. R hemi-technique w/c propulsion ~146ft to main therapy gym with close supervision and repeated max cuing for L attention as pt continues to demo R gaze preference with lack of ability to reorient self to L side without max cuing. [name] in agreement to provide pt physical assistance during therapy session to gain additional hands-on practice. Sit>stands w/c>R UE support with min/mod assist for balance and therapist facilitating L hip/knee extension and cuing for trunk extension and upright posturing. Gait training 8ft, 64ft +2 heavy mod assist., using R UE support around wife's shoulders and therapist providing max assist for balance and L LE management - therapist providing max manual facilitation for L weight shift and L hip/knee extension during stance phase  to prevent buckling and improve gait mechanics with verbal/tactile cuing to maintain upright posture - max manual facilitation for  LLE swing phase with pt able to initiate advancement in an incoordinated excessive hip/knee flexion pattern (similar to flexor tone pattern but do not believe it was true tone) - 3rd person providing w/c follow for safety and assisting with line management. Standing L LE NMR and dynamic  balance challenge via repeated R LE foot taps on/off 4" step with R HHA from 2nd person and therapist providing mod/max assist for balance and blocking L knee buckle - mirror feedback and max manual facilitation to maintain L hip/knee extension and trunk upright as pt tends to flex trunk forward and down towards L side requiring max cuing for correction. R hemi-technique w/c mobility back to room as described above. Pt left sitting in w/c with L UE supported on 1/2 lap tray with wife and interpreter present and another PT arriving for next session.  Therapy Documentation Precautions:  Precautions Precautions: Fall,Other (comment) Precaution Comments: L hemi, neglect Restrictions Weight Bearing Restrictions: No  Pain:   Reports L knee pain during standing balance task; however, with readjustment of LE positioning and providing R HHA for additional support, pt reports pain dissipated.   Therapy/Group: Individual Therapy  Ginny Forth , PT, DPT, CSRS  05/09/2020, 8:01 AM

## 2020-05-09 NOTE — Progress Notes (Signed)
Occupational Therapy Session Note  Patient Details  Name: Anthony Huber MRN: [id number] Date of Birth: 1960-12-15  Today's Date: 05/09/2020 OT Individual Time: 0900-1005 OT Individual Time Calculation (min): 65 min    Short Term Goals: Week 4:  OT Short Term Goal 1 (Week 4): Pt will don a shirt with S demonstrating improved L side awareness. OT Short Term Goal 2 (Week 4): Pt will transfer to [facility] with CGA to min A. OT Short Term Goal 3 (Week 4): From [facility] pt will self cleanse with min A to support his balance.  Skilled Therapeutic Interventions/Progress Updates:    Pt received in bed with wife and interpretor in room. Pt declined shower due to feeling cold. Wife stated he was washed up earlier. Pt was able to sit to EOB with S with HOB raised slightly and with rail. He actively lifted L leg up and over to side of bed.  From EOB worked on dressing with min A overall and close S with balance.  CGA to STAND pivot to wc.  Pt taken outside as he and his wife have not been outside since April 2.  Pt loved being out in the sunshine and warm breeze.  Outside worked on A/arom of LUE by sliding arm up and down on sliding board propped against a table for sh flex and ext and then with board on his lap for horizontal abd and add.  Pt worked on squeezing wash cloth with assist to extend fingers.    Then worked on a scavenger hunt to find plastic colored eggs placed to his L side on a wall at his shoulder level. Pt worked on self propelling a wc with min A as he searched for the eggs. Min -mod cues to find eggs. He has to turn his head fully to the L to see objects directly on his left due to homonymous hemianopsia.    Once back in hospital, worked on self propelling wc but continues to slide forward in seat.  Pt needed to be cued to readjust several times.   At end of session, resting in wc with wife with him. Placed saebo stim go on L sh for deltoid stimulation for 60 min of unattended cyclic estim.  Pt  tolerated well, skin intact.   Saebo Stim One 330 pulse width 35 Hz pulse rate On 8 sec/ off 8 sec Ramp up/ down 2 sec Symmetrical Biphasic wave form  Max intensity at 500 Ohm load  Therapy Documentation Precautions:  Precautions Precautions: Fall,Other (comment) Precaution Comments: L hemi, neglect Restrictions Weight Bearing Restrictions: No   Pain: Pain Assessment Pain Scale: Faces Pain Score: 0-No pain Faces Pain Scale: No hurt ADL: ADL Grooming: Moderate assistance Upper Body Bathing: Minimal assistance Where Assessed-Upper Body Bathing: Shower Lower Body Bathing: Moderate assistance Where Assessed-Lower Body Bathing: Shower Upper Body Dressing: Contact guard Where Assessed-Upper Body Dressing: Edge of bed Lower Body Dressing: Moderate assistance Where Assessed-Lower Body Dressing: Edge of bed Toileting: Moderate assistance Where Assessed-Toileting: Teacher, adult education: Moderate assistance Toilet Transfer Method: Stand pivot Toilet Transfer Equipment: Acupuncturist: Moderate assistance Film/video editor Method: Administrator: Transfer tub bench,Grab bars  Therapy/Group: Individual Therapy  Layli Capshaw 05/09/2020, 1:09 PM

## 2020-05-09 NOTE — Progress Notes (Signed)
Physical Therapy Session Note  Patient Details  Name: Anthony Huber MRN: [id number] Date of Birth: [birth year]  Today's Date: 05/09/2020 PT Individual Time: 1350-1416 PT Individual Time Calculation (min): 26 min   Short Term Goals: Week 4:  PT Short Term Goal 1 (Week 5): STG = LTG due to ELOS  Skilled Therapeutic Interventions/Progress Updates:    Patient in w/c in room following previous PT session with wife and interpreter in the room.  Patient squat pivot to bed with mod A.  Performed sit to supine with min A for L LE and supine for single limb bridge on L x 15 w/ 5 sec hold.  Supine D2 PNF L hip flexion with resistance to hip and mod cues with quick stretch for L ankle DF.  Sidelying hamstring curls attempted, but noted into flexion synergy and no hamstring activation.  Patient supine to sit with min A and attempted in sitting, but pt unable to activate so performed sit to squat with assist for L lateral weight shift x 10 reps.  Patient scooting to [facility] with mod A and cues for stepping in squat position to R with R hand on bed rail.  Patient left seated EOB with wife sitting beside.    Therapy Documentation Precautions:  Precautions Precautions: Fall,Other (comment) Precaution Comments: L hemi, neglect Restrictions Weight Bearing Restrictions: No Pain: Pain Assessment Pain Score: 0-No pain   Therapy/Group: Individual Therapy  Elray Mcgregor  Sheran Lawless, PT 05/09/2020, 5:33 PM

## 2020-05-10 LAB — CBC WITH DIFFERENTIAL/PLATELET
Abs Immature Granulocytes: 0.04 10*3/uL (ref 0.00–0.07)
Basophils Absolute: 0.1 10*3/uL (ref 0.0–0.1)
Basophils Relative: 1 %
Eosinophils Absolute: 2.6 10*3/uL — ABNORMAL HIGH (ref 0.0–0.5)
Eosinophils Relative: 24 %
HCT: 42.9 % (ref 39.0–52.0)
Hemoglobin: 13.4 g/dL (ref 13.0–17.0)
Immature Granulocytes: 0 %
Lymphocytes Relative: 19 %
Lymphs Abs: 2 10*3/uL (ref 0.7–4.0)
MCH: 21.8 pg — ABNORMAL LOW (ref 26.0–34.0)
MCHC: 31.2 g/dL (ref 30.0–36.0)
MCV: 69.8 fL — ABNORMAL LOW (ref 80.0–100.0)
Monocytes Absolute: 0.9 10*3/uL (ref 0.1–1.0)
Monocytes Relative: 9 %
Neutro Abs: 5 10*3/uL (ref 1.7–7.7)
Neutrophils Relative %: 47 %
Platelets: 280 10*3/uL (ref 150–400)
RBC: 6.15 MIL/uL — ABNORMAL HIGH (ref 4.22–5.81)
RDW: 15.6 % — ABNORMAL HIGH (ref 11.5–15.5)
WBC: 10.6 10*3/uL — ABNORMAL HIGH (ref 4.0–10.5)
nRBC: 0 % (ref 0.0–0.2)

## 2020-05-10 LAB — BASIC METABOLIC PANEL
Anion gap: 11 (ref 5–15)
BUN: 28 mg/dL — ABNORMAL HIGH (ref 8–23)
CO2: 24 mmol/L (ref 22–32)
Calcium: 10.1 mg/dL (ref 8.9–10.3)
Chloride: 101 mmol/L (ref 98–111)
Creatinine, Ser: 1.28 mg/dL — ABNORMAL HIGH (ref 0.61–1.24)
GFR, Estimated: >60 mL/min (ref >60)
Glucose, Bld: 90 mg/dL (ref 70–99)
Potassium: 4.8 mmol/L (ref 3.5–5.1)
Sodium: 136 mmol/L (ref 135–145)

## 2020-05-10 NOTE — Progress Notes (Signed)
Occupational Therapy Weekly Progress Note  Patient Details  Name: Anthony Huber MRN: [id number] Date of Birth: 06/25/1960  Beginning of progress report period: May 02, 2020 End of progress report period: May 10, 2020  Today's Date: 05/10/2020 OT Individual Time: 0845-1000 OT Individual Time Calculation (min): 75 min (60 min of unattended estim)  Patient has met 1 of 3 short term goals.  Pt is continuing to make excellent progress and is now moving much more independently which is greatly reducing the burden of care on his wife.  She is now comfortable with assisting with transfers and she has observed and participated in every session so she knows how to cue him with hemidressing techniques.    Patient continues to demonstrate the following deficits: abnormal tone and unbalanced muscle activation, decreased visual perceptual skills, decreased visual motor skills and hemianopsia, left side neglect, decreased attention, decreased awareness, decreased problem solving, decreased safety awareness, decreased memory and delayed processing and decreased sitting balance, decreased standing balance, decreased postural control, hemiplegia and decreased balance strategies and therefore will continue to benefit from skilled OT intervention to enhance overall performance with BADL and Reduce care partner burden.  Patient progressing toward long term goals..  Plan of care revisions:  Problem: RH Balance Goal: LTG: Patient will maintain dynamic sitting balance (OT) Description: LTG:  Patient will maintain dynamic sitting balance with assistance during activities of daily living (OT) Flowsheets (Taken 05/08/2020 1127) LTG: Pt will maintain dynamic sitting balance during ADLs with: (LTG upgraded due to excellent progress.) Contact Guard/Touching assist Note: LTG upgraded due to excellent progress.   Problem: RH Grooming Goal: LTG Patient will perform grooming w/assist,cues/equip (OT) Description: LTG: Patient will  perform grooming with assist, with/without cues using equipment (OT) Flowsheets (Taken 05/08/2020 1127) LTG: Pt will perform grooming with assistance level of: (LTG upgraded due to excellent progress.) Set up assist Note: LTG upgraded due to excellent progress.   Problem: RH Bathing Goal: LTG Patient will bathe all body parts with assist levels (OT) Description: LTG: Patient will bathe all body parts with assist levels (OT) Flowsheets (Taken 05/08/2020 1127) LTG: Pt will perform bathing with assistance level/cueing: (LTG upgraded due to excellent progress.) Minimal Assistance - Patient > 75% Note: LTG upgraded due to excellent progress.   Problem: RH Dressing Goal: LTG Patient will perform upper body dressing (OT) Description: LTG Patient will perform upper body dressing with assist, with/without cues (OT). Flowsheets (Taken 05/08/2020 1127) LTG: Pt will perform upper body dressing with assistance level of: (LTG upgraded due to excellent progress.) Contact Guard/Touching assist Note: LTG upgraded due to excellent progress.   Problem: RH Toileting Goal: LTG Patient will perform toileting task (3/3 steps) with assistance level (OT) Description: LTG: Patient will perform toileting task (3/3 steps) with assistance level (OT)  Flowsheets (Taken 05/08/2020 1127) LTG: Pt will perform toileting task (3/3 steps) with assistance level: (LTG upgraded due to excellent progress.) Moderate Assistance - Patient 50 - 74% Note: LTG upgraded due to excellent progress.   Problem: RH Toilet Transfers Goal: LTG Patient will perform toilet transfers w/assist (OT) Description: LTG: Patient will perform toilet transfers with assist, with/without cues using equipment (OT) Flowsheets (Taken 05/08/2020 1127) LTG: Pt will perform toilet transfers with assistance level of: Minimal Assistance - Patient > 75%   Problem: RH Tub/Shower Transfers Goal: LTG Patient will perform tub/shower transfers w/assist (OT) Description:  LTG: Patient will perform tub/shower transfers with assist, with/without cues using equipment (OT) Flowsheets (Taken 05/08/2020 1127) LTG: Pt will perform tub/shower  stall transfers with assistance level of: Minimal Assistance - Patient > 75%   Problem: RH Memory Goal: LTG Patient will demonstrate ability for day to day recall/carry over during activities of daily living with assistance level (OT) Description: LTG:  Patient will demonstrate ability for day to day recall/carry over during activities of daily living with assistance level (OT). Flowsheets (Taken 05/08/2020 1127) LTG:  Patient will demonstrate ability for day to day recall/carry over during activities of daily living with assistance level (OT): (LTG upgraded due to excellent progress.) Minimal Assistance - Patient > 75% Note: LTG upgraded due to excellent progress.   Problem: RH Dressing Goal: LTG Patient will perform lower body dressing w/assist (OT) Description: LTG: Patient will perform lower body dressing with assist, with/without cues in positioning using equipment (OT) Flowsheets (Taken 05/08/2020 1127) LTG: Pt will perform lower body dressing with assistance level of: Moderate Assistance - Patient 50 - 74%     OT Short Term Goals Week 4:  OT Short Term Goal 1 (Week 4): Pt will don a shirt with S demonstrating improved L side awareness. OT Short Term Goal 1 - Progress (Week 4): Progressing toward goal OT Short Term Goal 2 (Week 4): Pt will transfer to [facility] with CGA to min A. OT Short Term Goal 2 - Progress (Week 4): Met OT Short Term Goal 3 (Week 4): From [facility] pt will self cleanse with min A to support his balance. OT Short Term Goal 3 - Progress (Week 4): Progressing toward goal Week 5:  OT Short Term Goal 1 (Week 5): STGs = LTGs  Skilled Therapeutic Interventions/Progress Updates:    Pt seen this session for ADL training with his spouse. No interpretor present.  Pt did not need to toilet but agreeable to a shower.  (I still  have not received measurements of bathroom doorway, so unsure if he will be able to access shower at home).  Pt did extremely well with self care today.  See ADL documentation below.  He only has nonslip socks which make donning sneakers more difficult. Provided pt with a pair of regular socks and elastic shoelaces. Now he can don socks and shoes with mod A.    Pt taken to gym to sit on mat to engage in LUE NMR with a/arom with towel slides, skate board and then estim to forearm for wrist and finger extensors for 10 minutes. estim moved to L deltoid for cyclic estim unattended for 60 min. estim removed an hour later with no adverse skin reactions.  Pt tolerated well.    Pt opted to stay sitting up in wc. Wife in room with patient.     Therapy Documentation Precautions:  Precautions Precautions: Fall,Other (comment) Precaution Comments: L hemi, neglect Restrictions Weight Bearing Restrictions: No   Pain: Pain Assessment Pain Score: 0-No pain ADL: ADL Eating: Set up Grooming: Setup Upper Body Bathing: Minimal assistance Where Assessed-Upper Body Bathing: Shower Lower Body Bathing: Minimal assistance Where Assessed-Lower Body Bathing: Shower Upper Body Dressing: Contact guard Where Assessed-Upper Body Dressing: Edge of bed Lower Body Dressing: Minimal assistance Where Assessed-Lower Body Dressing: Edge of bed Toileting: Moderate assistance Where Assessed-Toileting: Glass blower/designer: Psychiatric nurse Method: Arts development officer: Energy manager: Environmental education officer Method: Education officer, environmental: Transfer tub bench,Grab bars   Therapy/Group: Individual Therapy  Vici 05/10/2020, 12:25 PM

## 2020-05-10 NOTE — Progress Notes (Signed)
PROGRESS NOTE   Subjective/Complaints: Patient seen laying in bed this morning.  Wife at bedside.  He indicates he slept well overnight.  He denies dizziness and knee pain.  ROS: Limited due to language.  Denies dizziness, right knee pain.  Objective:   No results found. Recent Labs    05/10/20 0504  WBC 10.6*  HGB 13.4  HCT 42.9  PLT 280   Recent Labs    05/10/20 0504  NA 136  K 4.8  CL 101  CO2 24  GLUCOSE 90  BUN 28*  CREATININE 1.28*  CALCIUM 10.1    Intake/Output Summary (Last 24 hours) at 05/10/2020 1008 Last data filed at 05/10/2020 0900 Gross per 24 hour  Intake 238 ml  Output 325 ml  Net -87 ml        Physical Exam: Vital Signs Blood pressure 114/79, pulse 81, temperature 97.9 F (36.6 C), temperature source Oral, resp. rate 16, height 4\' 11"  (1.499 m), weight 43.7 kg, SpO2 96 %.  Constitutional: No distress . Vital signs reviewed. HENT: Normocephalic.  Atraumatic. Eyes: EOMI. No discharge. Cardiovascular: No JVD.  RRR. Respiratory: Normal effort.  No stridor.  Bilateral clear to auscultation. GI: Non-distended.  BS +. Skin: Warm and dry.  Intact. Psych: Normal mood.  Normal behavior. Musc: No edema in extremities.  No tenderness in extremities. Neuro: Alert Left facial weakness, unchanged Left gaze preference, stable LUE: 1+/5 proximal to distal with significant apraxia, stable LLE: HF 2+/5, KE 3/5, ADF 1 /5 with apraxia, stable No increase in tone noted   Assessment/Plan: 1. Functional deficits which require 3+ hours per day of interdisciplinary therapy in a comprehensive inpatient rehab setting.  Physiatrist is providing close team supervision and 24 hour management of active medical problems listed below.  Physiatrist and rehab team continue to assess barriers to discharge/monitor patient progress toward functional and medical goals  Care Tool:  Bathing    Body parts bathed by  patient: Face,Chest,Abdomen,Left arm,Front perineal area,Right upper leg,Left upper leg,Right lower leg   Body parts bathed by helper: Right arm,Buttocks,Left lower leg     Bathing assist Assist Level: Moderate Assistance - Patient 50 - 74%     Upper Body Dressing/Undressing Upper body dressing   What is the patient wearing?: Pull over shirt    Upper body assist Assist Level: Contact Guard/Touching assist    Lower Body Dressing/Undressing Lower body dressing      What is the patient wearing?: Pants     Lower body assist Assist for lower body dressing: Minimal Assistance - Patient > 75%     Toileting Toileting    Toileting assist Assist for toileting: Maximal Assistance - Patient 25 - 49%     Transfers Chair/bed transfer  Transfers assist     Chair/bed transfer assist level: Minimal Assistance - Patient > 75%     Locomotion Ambulation   Ambulation assist   Ambulation activity did not occur: Safety/medical concerns  Assist level: 2 helpers (+2 mod A) Assistive device: Other (comment) (3 Musketeer) Max distance: 39ft   Walk 10 feet activity   Assist  Walk 10 feet activity did not occur: Safety/medical concerns  Assist level: 2  helpers (+2 mod A) Assistive device: Other (comment) (3 Musketeer)   Walk 50 feet activity   Assist Walk 50 feet with 2 turns activity did not occur: Safety/medical concerns  Assist level: 2 helpers (+2 mod A) Assistive device: Other (comment) (3 Musketeer)    Walk 150 feet activity   Assist Walk 150 feet activity did not occur: Safety/medical concerns         Walk 10 feet on uneven surface  activity   Assist Walk 10 feet on uneven surfaces activity did not occur: Safety/medical concerns         Wheelchair     Assist Will patient use wheelchair at discharge?: Yes Type of Wheelchair: Manual Wheelchair activity did not occur: Safety/medical concerns  Wheelchair assist level: Supervision/Verbal cueing  (max cuing) Max wheelchair distance: 112ft    Wheelchair 50 feet with 2 turns activity    Assist    Wheelchair 50 feet with 2 turns activity did not occur: Safety/medical concerns       Wheelchair 150 feet activity     Assist  Wheelchair 150 feet activity did not occur: Safety/medical concerns       Medical Problem List and Plan: 1.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 Impaired mobility -DVT/anticoagulation:Continue Subcutaneous heparin initiated for 02/25/2020 -antiplatelet therapy: N/A 3. Lower back pain:continue Tylenol as needed. D/c tramadol. Added kpad and lidocaine patch overnight 4. Mood:Provide emotional support -antipsychotic agents: N/A 5. Neuropsych: This patientis notcapable of making decisions on hisown behalf. 6. Skin/Wound Care:Routine skin checks 7. Fluids/Electrolytes/Nutrition:Routine in and outs. 8. Hypertension.              Continue Norvasc 10 mg daily             Lopressor 50 mg twice daily.              D/c'ed clonidine.              Controlled on 5/6 9. History of open Uretlithotomy2005 in [place] as well as left PCNL and laser ureterotomyOctober 2018 followed by Dr. Marlou Porch. 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  -CONTINUE FOLEY TUBE.DO NOT REMOVE .Foley catheter to remain in place until follow-up in the office of Dr. Modena Slater for cystogram. 10. Diet controlled diabetes mellitus. Hemoglobin A1c 5.8. SSI  Educate pt/family as needed  Metformin decreased back to 500mg  daily and restarted CBG checks. Continue metformin  Controlled on 5/6 11.  Leukocytosis  WBCs 10.6 on 5/6             Afebrile             Recheck 4/19 12.  Acute blood loss anemia             Hemoglobin  13.4 on 5/6             Recheck 4/19 13.  CKD              Creatinine 1.28 on 5/6, labs ordered for Monday             Encourage fluids, discussed again 14.  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  Slow transit constipation             Bowel meds increased on 4/8             Improved 16. Lethargy:   Continue Ritalin 5mg  BID, will plan on weaning  Improving 17. Emesis              Appears to have resolved              Changed Lipitor to HS to spread out medications     LOS: 29 days A FACE TO FACE EVALUATION WAS PERFORMED  Rollen Selders 05/10/2020, 10:08 AM

## 2020-05-10 NOTE — Progress Notes (Signed)
Physical Therapy Session Note  Patient Details  Name: Anthony Huber MRN: [id number] Date of Birth: [birth year]  Today's Date: 05/10/2020 PT Individual Time: 1300-1355 PT Individual Time Calculation (min): 55 min   Short Term Goals: Week 5:  PT Short Term Goal 1 (Week 5): STG = LTG due to ELOS  Skilled Therapeutic Interventions/Progress Updates:    Pt greeted supine in bed at start. Wife and interpreter at bedside. Pt's custom w/c has been delivered to room. Educated and demonstrated to wife w/c management of parts (brakes, drop-arm rests, removal back rest, removable w/c cushion, removable wheels, and how to dissessemble and fold the chair. Had wife perform teach-back demonstration and she was quick to learn all the parts and management. Pt completed supine<>sit with minA for L hemibody management. Sit's EOB with supervision. Donned tennis shoes with totalA for time management - x1 large LOB posteriorly while applying tennis shoes. Stand<>pivot transfer with minA from EOB to w/c and able to reposition with cues.  Pt propelled himself to main rehab gym with supervision and intermittent minA for straight path - using R hemibody for propulsion. Cues for L gaze and awareness of L side of environment to reduce risk of injury.  Stand<>pivot transfer from w/c to mat table with minA and L knee block. Performed repeated sit<>stands with minA and L knee block for "warm up." With min/modA guard and L knee block, performed dynamic standing balance with ball toss (unweighted ball) with wife without BUE support. Pt able to use his RUE to catch and toss the ball with good and appropriate coordination. Newman Pies toss completed in forward, R, & L, directions to challenge balance and L awareness.  Completed short sitting edge of mat to tall kneeling on high/low mat table with +2 assist from wife. With UE support onto table, worked on [employer] for LLE in tall kneeling and multi-modal cues for achieving erect posture and  upright/midline positioning. Pt able to complete repeated hip flex/ext (sitting on butt to full tall kneeling) but needs modA for trunk support due to L lateran lean. Worked on static tall kneeling for L glut strengthening and LLE weight bearing. Needed +2 assist to achieve tall kneeling to short sitting transition on mat table.   Stand<>pivot transfer with minA back to his w/c with L knee block and he propelled himself back to his room, 138ft, with supervision/minA (for straight path). Pt requesting to remain seated in w/c. Wife at bedside, needs in reach, at end of session.  Therapy Documentation Precautions:  Precautions Precautions: Fall,Other (comment) Precaution Comments: L hemi, neglect Restrictions Weight Bearing Restrictions: No General:    Therapy/Group: Individual Therapy  [facility] General P Shequila Neglia PT 05/10/2020, 7:40 AM

## 2020-05-10 NOTE — Progress Notes (Signed)
Speech Language Pathology Weekly Progress and Session Note  Patient Details  Name: Anthony Huber MRN: [id number] Date of Birth: [birth year]  Beginning of progress report period: 06/02/2020 End of progress report period: 05/10/2020 Today's Date: 05/10/2020    Short Term Goals: Week 4: SLP Short Term Goal 1 (Week 4): Patient will initiate during conversation and functional tasks with modA cues to perform consistently. SLP Short Term Goal 1 - Progress (Week 4): Met SLP Short Term Goal 2 (Week 4): Patient will maintain attention to task/conversational topic with modA cues to redirect. SLP Short Term Goal 2 - Progress (Week 4): Met SLP Short Term Goal 3 (Week 4): Patient will demonstrate functional problem solving for basic level tasks with minA cues. SLP Short Term Goal 3 - Progress (Week 4): Met SLP Short Term Goal 4 (Week 4): Patient will initiate and maintain adequate eye contact with speaker when on right side visual field with modA cues. SLP Short Term Goal 4 - Progress (Week 4): Progressing toward goal    New Short Term Goals: Week 5: SLP Short Term Goal 1 (Week 5): STG=LTG (ELOS: 5/13)  Weekly Progress Updates:  Patient has made good progress and met 3/4 STG's. He continues to demonstrate progress with problem solving, ability to maintain attention during tasks and conversation, but requires cues to initiate, visually attend to speaker on right side visual field and requires cues for awareness to and attempts to compensate for deficits. Plan continues to be discharge home with family providing 24 hour supervision and assistance.    Intensity: Minumum of 1-2 x/day, 30 to 90 minutes Frequency: 3 to 5 out of 7 days Duration/Length of Stay: 5/13 Treatment/Interventions: Environmental controls;Functional tasks;Speech/Language facilitation;Patient/family education;Internal/external aids;Cueing hierarchy;Cognitive remediation/compensation   Daily Session  Session cancelled due to no access to  live or audio/video interpreter.    Therapy/Group: Individual Therapy  Sonia Baller, MA, CCC-SLP Speech Therapy

## 2020-05-11 NOTE — Progress Notes (Signed)
Occupational Therapy Session Note  Patient Details  Name: Anthony Huber MRN: [id number] Date of Birth: 1960-05-30  Today's Date: 05/11/2020 OT Individual Time: 1000-1100 OT Individual Time Calculation (min): 60 min (plus 60 min of unattended estim)   Short Term Goals: Week 5:  OT Short Term Goal 1 (Week 5): STGs = LTGs  Skilled Therapeutic Interventions/Progress Updates:    Pt received in room with wife and interpretor present.   Pt sat to EOB with increased cuing today and CGA.  Worked on dressing with increased cuing. Not sure if pt was tired or just more distracted.  Overall he was able to dress with min A he just needed more cuing.   Had his wife practice transfers with him and she was still helping too much, so demonstrated to her with pt stand pivot transfers 4x and then repeat demo.  She did fairly well. At this point, pt does best if helper places their L arm under his L axilla to support shoulder and then places R hand on L hip to ensure smooth pivot. Pt taken to gym to engage in LUE NMR with wt bearing, dynamic reaching, visual tracking to L, active grasping with soft lego blocks, and a/arom with UE ranger.   Educated spouse with pictures on sh subluxation.   saebo estim placed on L deltoid for 60 min of unattended estim for sh subluxation.  Saebo Stim One 330 pulse width 35 Hz pulse rate On 8 sec/ off 8 sec Ramp up/ down 2 sec Symmetrical Biphasic wave form  Max intensity 168m at 500 Ohm load  Pt tolerated well.  Pt in room with spouse - all needs met.   Therapy Documentation Precautions:  Precautions Precautions: Fall,Other (comment) Precaution Comments: L hemi, neglect Restrictions Weight Bearing Restrictions: No   Pain: Pain Assessment Pain Score: 0-No pain ADL: ADL Eating: Set up Grooming: Setup Upper Body Bathing: Minimal assistance Where Assessed-Upper Body Bathing: Shower Lower Body Bathing: Minimal assistance Where Assessed-Lower Body Bathing:  Shower Upper Body Dressing: Contact guard Where Assessed-Upper Body Dressing: Edge of bed Lower Body Dressing: Minimal assistance Where Assessed-Lower Body Dressing: Edge of bed Toileting: Moderate assistance Where Assessed-Toileting: TGlass blower/designer MPsychiatric nurseMethod: SArts development officer GEnergy manager MEnvironmental education officerMethod: SEducation officer, environmental Transfer tub bench,Grab bars  Therapy/Group: Individual Therapy  [place]5/07/2020, 12:56 PM

## 2020-05-11 NOTE — Progress Notes (Signed)
PROGRESS NOTE   Subjective/Complaints:  Pt reports no issues- LBM yesterday- knee pain "OK". Wife at bedside.  ROS: limited by language- used son over the phone as interpretor  Objective:   No results found. Recent Labs    05/10/20 0504  WBC 10.6*  HGB 13.4  HCT 42.9  PLT 280   Recent Labs    05/10/20 0504  NA 136  K 4.8  CL 101  CO2 24  GLUCOSE 90  BUN 28*  CREATININE 1.28*  CALCIUM 10.1    Intake/Output Summary (Last 24 hours) at 05/11/2020 1444 Last data filed at 05/11/2020 0827 Gross per 24 hour  Intake 60 ml  Output 600 ml  Net -540 ml        Physical Exam: Vital Signs Blood pressure 119/78, pulse 75, temperature 98.2 F (36.8 C), temperature source Oral, resp. rate 18, height 4\' 11"  (1.499 m), weight 44.2 kg, SpO2 96 %.     General: awake, alert,  Awoke pt, but appropriate NAD HENT: conjugate gaze; oropharynx moist CV: regular rate; no JVD Pulmonary: CTA B/L; no W/R/R- good air movement GI: soft, NT, ND, (+)BS Psychiatric: appropriate/language barrier Neurological: alert Skin: Warm and dry.  Intact. Psych: Normal mood.  Normal behavior. Musc: No edema in extremities.  No tenderness in extremities. Neuro: Alert Left facial weakness, unchanged Left gaze preference, stable LUE: 1+/5 proximal to distal with significant apraxia, stable LLE: HF 2+/5, KE 3/5, ADF 1 /5 with apraxia, stable No increase in tone noted   Assessment/Plan: 1. Functional deficits which require 3+ hours per day of interdisciplinary therapy in a comprehensive inpatient rehab setting.  Physiatrist is providing close team supervision and 24 hour management of active medical problems listed below.  Physiatrist and rehab team continue to assess barriers to discharge/monitor patient progress toward functional and medical goals  Care Tool:  Bathing    Body parts bathed by patient: Face,Chest,Abdomen,Left arm,Front  perineal area,Right upper leg,Left upper leg,Right lower leg,Left lower leg   Body parts bathed by helper: Right arm,Buttocks     Bathing assist Assist Level: Minimal Assistance - Patient > 75%     Upper Body Dressing/Undressing Upper body dressing   What is the patient wearing?: Pull over shirt    Upper body assist Assist Level: Contact Guard/Touching assist    Lower Body Dressing/Undressing Lower body dressing      What is the patient wearing?: Pants     Lower body assist Assist for lower body dressing: Minimal Assistance - Patient > 75%     Toileting Toileting    Toileting assist Assist for toileting: Maximal Assistance - Patient 25 - 49%     Transfers Chair/bed transfer  Transfers assist     Chair/bed transfer assist level: Minimal Assistance - Patient > 75%     Locomotion Ambulation   Ambulation assist   Ambulation activity did not occur: Safety/medical concerns  Assist level: 2 helpers (+2 mod A) Assistive device: Other (comment) (3 Musketeer) Max distance: 56ft   Walk 10 feet activity   Assist  Walk 10 feet activity did not occur: Safety/medical concerns  Assist level: 2 helpers (+2 mod A) Assistive device: Other (comment) (3  Musketeer)   Walk 50 feet activity   Assist Walk 50 feet with 2 turns activity did not occur: Safety/medical concerns  Assist level: 2 helpers (+2 mod A) Assistive device: Other (comment) (3 Musketeer)    Walk 150 feet activity   Assist Walk 150 feet activity did not occur: Safety/medical concerns         Walk 10 feet on uneven surface  activity   Assist Walk 10 feet on uneven surfaces activity did not occur: Safety/medical concerns         Wheelchair     Assist Will patient use wheelchair at discharge?: Yes Type of Wheelchair: Manual Wheelchair activity did not occur: Safety/medical concerns  Wheelchair assist level: Supervision/Verbal cueing (max cuing) Max wheelchair distance: 177ft     Wheelchair 50 feet with 2 turns activity    Assist    Wheelchair 50 feet with 2 turns activity did not occur: Safety/medical concerns       Wheelchair 150 feet activity     Assist  Wheelchair 150 feet activity did not occur: Safety/medical concerns       Medical Problem List and Plan: 1.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 Impaired mobility -DVT/anticoagulation:Continue Subcutaneous heparin initiated for 02/25/2020 -antiplatelet therapy: N/A 3. Lower back pain:continue Tylenol as needed. D/c tramadol. Added kpad and lidocaine patch overnight  5/7- pain controlled- con't regimen 4. Mood:Provide emotional support -antipsychotic agents: N/A 5. Neuropsych: This patientis notcapable of making decisions on hisown behalf. 6. Skin/Wound Care:Routine skin checks 7. Fluids/Electrolytes/Nutrition:Routine in and outs. 8. Hypertension.              Continue Norvasc 10 mg daily             Lopressor 50 mg twice daily.              D/c'ed clonidine.              5/7- BP controlled- con't regimen 9. History of open Uretlithotomy2005 in [place] as well as left PCNL and laser ureterotomyOctober 2018 followed by Dr. Marlou Porch. 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  -CONTINUE FOLEY TUBE.DO NOT REMOVE .Foley catheter to remain in place until follow-up in the office of Dr. Modena Slater for cystogram. 10. Diet controlled diabetes mellitus. Hemoglobin A1c 5.8. SSI  Educate pt/family as needed  Metformin decreased back to 500mg  daily and restarted CBG checks. Continue metformin  Controlled on 5/6  5/7- BGs borderline low- at low of 76 in last 24 hours- but 76-137- will moitor 1 more day before looking at decreasing metformin.   11.  Leukocytosis  WBCs 10.6 on 5/6             Afebrile             Recheck 4/19 12.  Acute blood loss anemia             Hemoglobin 13.4 on 5/6             Recheck 4/19 13.  CKD              Creatinine 1.28 on 5/6, labs ordered for Monday             Encourage fluids, discussed again 14.  Gout and OA- in  right knee             X-ray ordered showing mild degenerative changes with small effusion             Voltaren gel             Colchicine restarted             Improved 15.  Slow transit constipation             Bowel meds increased on 4/8             Improved 16. Lethargy:   Continue Ritalin 5mg  BID, will plan on weaning  5/7- woke easily and saw with therapy- doing well- con't regimen  Improving 17. Emesis              Appears to have resolved              Changed Lipitor to HS to spread out medications     LOS: 30 days A FACE TO FACE EVALUATION WAS PERFORMED  Jakaiya Netherland 05/11/2020, 2:44 PM

## 2020-05-12 NOTE — Plan of Care (Signed)
  Problem: Consults Goal: RH STROKE PATIENT EDUCATION Description: See Patient Education module for education specifics  Outcome: Progressing Goal: Diabetes Guidelines if Diabetic/Glucose > 140 Description: If diabetic or lab glucose is > 140 mg/dl - Initiate Diabetes/Hyperglycemia Guidelines & Document Interventions  Outcome: Progressing   Problem: RH BOWEL ELIMINATION Goal: RH STG MANAGE BOWEL WITH ASSISTANCE Description: STG Manage Bowel with mod I Assistance. Outcome: Progressing   Problem: RH KNOWLEDGE DEFICIT Goal: RH STG INCREASE KNOWLEDGE OF DIABETES Description: Pt and family will demonstrated understanding of medication regimen, dietary and lifestyle modifications to better control blood glucose levels independently upon discharge.  Outcome: Progressing Goal: RH STG INCREASE KNOWLEDGE OF HYPERTENSION Description: Pt and family will demonstrated understanding of medication regimen, dietary and lifestyle modifications to better control blood pressure independently upon discharge.  Outcome: Progressing Goal: RH STG INCREASE KNOWLEGDE OF HYPERLIPIDEMIA Description: Pt and family will demonstrated understanding of medication regimen, dietary and lifestyle modifications to better control blood cholesterol levels independently upon discharge.  Outcome: Progressing Goal: RH STG INCREASE KNOWLEDGE OF STROKE PROPHYLAXIS Description: Pt and family will demonstrated understanding of medication regimen, dietary and lifestyle modifications to better control blood pressure levels and prevent stroke independently upon discharge.  Outcome: Progressing   Problem: RH BLADDER ELIMINATION Goal: RH STG MANAGE BLADDER WITH EQUIPMENT WITH ASSISTANCE Description: STG Manage Bladder With Equipment With mod Assistance Outcome: Progressing   

## 2020-05-12 NOTE — Plan of Care (Signed)
  Problem: Consults Goal: RH STROKE PATIENT EDUCATION Description: See Patient Education module for education specifics  Outcome: Progressing Goal: Diabetes Guidelines if Diabetic/Glucose > 140 Description: If diabetic or lab glucose is > 140 mg/dl - Initiate Diabetes/Hyperglycemia Guidelines & Document Interventions  Outcome: Progressing   Problem: RH BOWEL ELIMINATION Goal: RH STG MANAGE BOWEL WITH ASSISTANCE Description: STG Manage Bowel with mod I Assistance. Outcome: Progressing   Problem: RH KNOWLEDGE DEFICIT Goal: RH STG INCREASE KNOWLEDGE OF DIABETES Description: Pt and family will demonstrated understanding of medication regimen, dietary and lifestyle modifications to better control blood glucose levels independently upon discharge.  Outcome: Progressing Goal: RH STG INCREASE KNOWLEDGE OF HYPERTENSION Description: Pt and family will demonstrated understanding of medication regimen, dietary and lifestyle modifications to better control blood pressure independently upon discharge.  Outcome: Progressing Goal: RH STG INCREASE KNOWLEGDE OF HYPERLIPIDEMIA Description: Pt and family will demonstrated understanding of medication regimen, dietary and lifestyle modifications to better control blood cholesterol levels independently upon discharge.  Outcome: Progressing Goal: RH STG INCREASE KNOWLEDGE OF STROKE PROPHYLAXIS Description: Pt and family will demonstrated understanding of medication regimen, dietary and lifestyle modifications to better control blood pressure levels and prevent stroke independently upon discharge.  Outcome: Progressing   Problem: RH BLADDER ELIMINATION Goal: RH STG MANAGE BLADDER WITH EQUIPMENT WITH ASSISTANCE Description: STG Manage Bladder With Equipment With mod Assistance Outcome: Progressing

## 2020-05-13 DIAGNOSIS — R52 Pain, unspecified: Secondary | ICD-10-CM

## 2020-05-13 LAB — BASIC METABOLIC PANEL
Anion gap: 9 (ref 5–15)
BUN: 25 mg/dL — ABNORMAL HIGH (ref 8–23)
CO2: 22 mmol/L (ref 22–32)
Calcium: 9.8 mg/dL (ref 8.9–10.3)
Chloride: 103 mmol/L (ref 98–111)
Creatinine, Ser: 1.14 mg/dL (ref 0.61–1.24)
GFR, Estimated: >60 mL/min (ref >60)
Glucose, Bld: 141 mg/dL — ABNORMAL HIGH (ref 70–99)
Potassium: 4.7 mmol/L (ref 3.5–5.1)
Sodium: 134 mmol/L — ABNORMAL LOW (ref 135–145)

## 2020-05-13 LAB — GLUCOSE, CAPILLARY
Glucose-Capillary: 99 mg/dL (ref 70–99)
Glucose-Capillary: 106 mg/dL — ABNORMAL HIGH (ref 70–99)
Glucose-Capillary: 115 mg/dL — ABNORMAL HIGH (ref 70–99)
Glucose-Capillary: 130 mg/dL — ABNORMAL HIGH (ref 70–99)

## 2020-05-13 NOTE — Progress Notes (Signed)
PROGRESS NOTE   Subjective/Complaints: Patient seen laying in bed this AM.  Wife at bedside. He states he slept well overnight.  He denies complaints. He states he had a good weekend.   ROS: limited by language  Objective:   No results found. No results for input(s): WBC, HGB, HCT, PLT in the last 72 hours. No results for input(s): NA, K, CL, CO2, GLUCOSE, BUN, CREATININE, CALCIUM in the last 72 hours.  Intake/Output Summary (Last 24 hours) at 05/13/2020 1229 Last data filed at 05/13/2020 0849 Gross per 24 hour  Intake 300 ml  Output 950 ml  Net -650 ml        Physical Exam: Vital Signs Blood pressure 114/74, pulse 80, temperature 98 F (36.7 C), resp. rate 16, height 4\' 11"  (1.499 m), weight 44.5 kg, SpO2 95 %.  Constitutional: No distress . Vital signs reviewed. HENT: Normocephalic.  Atraumatic. Eyes: EOMI. No discharge. Cardiovascular: No JVD.  RRR. Respiratory: Normal effort.  No stridor.  Bilateral clear to auscultation. GI: Non-distended.  BS +. Skin: Warm and dry.  Intact. Psych: Normal mood.  Normal behavior. Musc: No edema in extremities.  No tenderness in extremities. Neurological: Alert Left facial weakness, stable Left gaze preference, ?improving LUE: 1+/5 proximal to distal with significant apraxia, stable LLE: HF 2+/5, KE 3/5, ADF 1 /5 with apraxia, stable No increase in tone noted   Assessment/Plan: 1. Functional deficits which require 3+ hours per day of interdisciplinary therapy in a comprehensive inpatient rehab setting.  Physiatrist is providing close team supervision and 24 hour management of active medical problems listed below.  Physiatrist and rehab team continue to assess barriers to discharge/monitor patient progress toward functional and medical goals  Care Tool:  Bathing    Body parts bathed by patient: Face,Chest,Abdomen,Left arm,Front perineal area,Right upper leg,Left upper  leg,Right lower leg,Left lower leg   Body parts bathed by helper: Right arm,Buttocks     Bathing assist Assist Level: Minimal Assistance - Patient > 75%     Upper Body Dressing/Undressing Upper body dressing   What is the patient wearing?: Pull over shirt    Upper body assist Assist Level: Contact Guard/Touching assist    Lower Body Dressing/Undressing Lower body dressing      What is the patient wearing?: Pants     Lower body assist Assist for lower body dressing: Minimal Assistance - Patient > 75%     Toileting Toileting    Toileting assist Assist for toileting: Maximal Assistance - Patient 25 - 49%     Transfers Chair/bed transfer  Transfers assist     Chair/bed transfer assist level: Minimal Assistance - Patient > 75%     Locomotion Ambulation   Ambulation assist   Ambulation activity did not occur: Safety/medical concerns  Assist level: 2 helpers (+2 mod A) Assistive device: Other (comment) (3 Musketeer) Max distance: 70ft   Walk 10 feet activity   Assist  Walk 10 feet activity did not occur: Safety/medical concerns  Assist level: 2 helpers (+2 mod A) Assistive device: Other (comment) (3 Musketeer)   Walk 50 feet activity   Assist Walk 50 feet with 2 turns activity did not occur: Safety/medical concerns  Assist level: 2 helpers (+2 mod A) Assistive device: Other (comment) (3 Musketeer)    Walk 150 feet activity   Assist Walk 150 feet activity did not occur: Safety/medical concerns         Walk 10 feet on uneven surface  activity   Assist Walk 10 feet on uneven surfaces activity did not occur: Safety/medical concerns         Wheelchair     Assist Will patient use wheelchair at discharge?: Yes Type of Wheelchair: Manual Wheelchair activity did not occur: Safety/medical concerns  Wheelchair assist level: Supervision/Verbal cueing (max cuing) Max wheelchair distance: 156ft    Wheelchair 50 feet with 2 turns  activity    Assist    Wheelchair 50 feet with 2 turns activity did not occur: Safety/medical concerns       Wheelchair 150 feet activity     Assist  Wheelchair 150 feet activity did not occur: Safety/medical concerns       Medical Problem List and Plan: 1.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 Impaired mobility -DVT/anticoagulation:Continue Subcutaneous heparin initiated for 02/25/2020 -antiplatelet therapy: N/A 3. Lower back pain:continue Tylenol as needed. D/c tramadol. Added kpad and lidocaine patch overnight  Controlled on 5/6 4. Mood:Provide emotional support -antipsychotic agents: N/A 5. Neuropsych: This patientis notcapable of making decisions on hisown behalf. 6. Skin/Wound Care:Routine skin checks 7. Fluids/Electrolytes/Nutrition:Routine in and outs. 8. Hypertension.              Continue Norvasc 10 mg daily             Lopressor 50 mg twice daily.              D/c'ed clonidine.              Controlled on 5/6 9. History of open [place] in [place] as well as left PCNL and laser ureterotomyOctober 2018 followed by Dr. Marlou Porch. 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  -CONTINUE FOLEY TUBE.DO NOT REMOVE .Foley catheter to remain in place until follow-up in the office of Dr. Modena Slater for cystogram. 10. Diet controlled diabetes mellitus. Hemoglobin A1c 5.8. SSI  Educate pt/family as needed  Metformin decreased back to 500mg  daily and restarted CBG checks. Continue metformin  Controlled on 5/6  Controlled on 5/9 11.  Leukocytosis  WBCs 10.6 on 5/6             Afebrile             Recheck 4/19 12.  Acute blood loss anemia             Hemoglobin 13.4 on 5/6             Recheck  4/19 13.  CKD              Creatinine 1.28 on 5/6, labs pending             Encourage fluids, discussed again 14.  Gout and OA- in right knee             X-ray ordered showing mild degenerative changes with small effusion             Voltaren gel             Colchicine restarted  Improved 15.  Slow transit constipation             Bowel meds increased on 4/8             Improved 16. Lethargy:   Continue Ritalin 5mg  BID, will plan on weaning this week  Improving 17. Emesis              Appears to have resolved              Changed Lipitor to HS to spread out medications     LOS: 32 days A FACE TO FACE EVALUATION WAS PERFORMED  Anthony Huber 05/13/2020, 12:29 PM

## 2020-05-13 NOTE — Progress Notes (Signed)
Speech Language Pathology Daily Session Note  Patient Details  Name: Anthony Huber MRN: [id number] Date of Birth: 09-13-1960  Today's Date: 05/13/2020 SLP Individual Time: 4782-9562 SLP Individual Time Calculation (min): 43 min  Short Term Goals: Week 5: SLP Short Term Goal 1 (Week 5): STG=LTG (ELOS: 5/13)  Skilled Therapeutic Interventions:Skilled ST services focused on cognitive skills. SLP facilitated basic problem solving and right visual scanning in novel card sorting task. Pt required initial supervision A verbal cues fading to mod I when sorting cards by colors and then shapes up to a field of 6. Pt was left in room with wife, call bell within reach and chair alarm set. SLP recommends to continue skilled services.     Pain Pain Assessment Pain Score: 0-No pain  Therapy/Group: Individual Therapy  Dorothyann Mourer  [facility] 05/13/2020, 2:34 PM

## 2020-05-13 NOTE — Progress Notes (Signed)
Occupational Therapy Session Note  Patient Details  Name: Uchechukwu Dhawan MRN: [id number] Date of Birth: 04-May-1960  Today's Date: 05/13/2020 OT Individual Time: 6168-3729 OT Individual Time Calculation (min): 70 min    Short Term Goals: Week 4:  OT Short Term Goal 1 (Week 4): Pt will don a shirt with S demonstrating improved L side awareness. OT Short Term Goal 1 - Progress (Week 4): Progressing toward goal OT Short Term Goal 2 (Week 4): Pt will transfer to [facility] with CGA to min A. OT Short Term Goal 2 - Progress (Week 4): Met OT Short Term Goal 3 (Week 4): From [facility] pt will self cleanse with min A to support his balance. OT Short Term Goal 3 - Progress (Week 4): Progressing toward goal  Skilled Therapeutic Interventions/Progress Updates:    Pt and spouse received, interpreter present and consented to OT tx. Session focused on L NMR, sitting balance this session. Pt instructed to complete large peg board activity with visual reference to match. Pt required mod cuing for executive functioning for correct placement and colors of pegs, therapist part of reference picture covered to simplify to focus on one portion at a time. Pegs and picture placed on L side of pt to increase attention to L side and promote visual scanning. Pt seen for NMR including weight bearing through LUE while sitting edge of mat with NMES applied to tricep muscles for elbow extension. Therapist blocked pt's elbow during weightbearing and encouraged to push through extended hand. NMES on triceps: 25 msCC for 15 mins, NMES on biceps 24 maCC for 10 minutes with therapist assisting facilitation of elbow flexion. Pt instructed in functional reach activity to pick up cones from floor with CGA. Pt demo's good core strength and trunk control during functional reach activity. Pt instructed in SPT from mat to w/c and req min A with w/c mobility back to room as pt almost ran into a cart on his L side while propelling with hemi-technique. After tx,  pt, spouse, and interpreter left in room with all needs met and all questions answered, awaiting speech therapy session to follow.  Therapy Documentation Precautions:  Precautions Precautions: Fall,Other (comment) Precaution Comments: L hemi, neglect Restrictions Weight Bearing Restrictions: No    Pain: none     Therapy/Group: Individual Therapy  Kinnedy Mongiello 05/13/2020, 10:35 AM

## 2020-05-13 NOTE — Progress Notes (Signed)
Physical Therapy Session Note  Patient Details  Name: Anthony Huber MRN: [id number] Date of Birth: December 17, 1960  Today's Date: 05/13/2020 PT Individual Time: 0825-0905 + 1300-1410 PT Individual Time Calculation (min): 40 min + 70 min  Short Term Goals: Week 5:  PT Short Term Goal 1 (Week 5): STG = LTG due to ELOS  Skilled Therapeutic Interventions/Progress Updates:   1st session:   Pt greeted supine in bed with wife at bedside, no interpreter present. Pt agreeable to PT tx with no reports of pain. Bed mobility completed with minA, cues for initiaition and LLE management. Sit's EOB with supervision and donned shoes with totalA for time management. Squat<>pivot transfer with minA from EOB to w/c with L knee block and cues for technique. Needs cues for scooting back and repositioning in w/c. Pt propelled himself with CGA/minA ~127ft from his room to main rehab gym with hemi technique. Needed cues to push bottom back in chair as he was sliding forwards. Squat<>pivot with minA and L knee block to mat table. Completed repeated sit<>stands with minA and L knee block with task overlay for matching playing cards to back of mirror in standing. Needs min/modA for balance in standing to reduce L lean. Reached for cards in sitting to his L to promote L gaze and L visual scanning. Completed a total of 20 stands during this activity. Pt then performed standing ball toss with min/modA for standing balance while wife tossed ball to him - working on dynamic standing balance, righting reactions, and coordination. OT arriving thereafter for handoff of care.   2nd session: Pt received supine in bed, wife and interpreter at bedside. Pt consenting to PT tx without reoprts of pain. Bed mobility completed with CGA with use of bedrail. Donned tennis shoes with totalA for time management, no posterior LOB noted while seated during this. Squat<>pivot transfer with minA from EOB to w/c, needing L knee block. Pt propelled himself with  CGA/minA ~156ft via hemi technique from his room to day room therapy gym. Retrieved AFO for L foot and this was donned with totalA - note that this had a poor fit due to being too large as pt is petite in nature. Focused remainder of session on NMR with LiteGait over Treadmill. Donned/doffed harness in sitting in w/c and needing modA for stepping onto and off of the treadmill. Completed the following durations:  Trial 1: 5 minutes, 169ft, ranging 0.3-0.6 mph. Partial offloading  -Needing assist for LLE facilitation in swing, stance, and placement. Poor ability to isolate movement patterns and pt "sitting" in harness as if it was a sling/swing. Max multi-modal cues needed for achieving upright standing and L knee extension.   Trial 2: 11 minutes and 32 seconds, 446ft, 0.4 mph. Partial offloading with trials of full weight bearing -With full weight bearing with no harness support, pt really struggles with achieving full upright and significant difficulty producing functional gait movement. Requires totalA for LLE management and modA for standing balance. Only brief amount of time spent in this position due to gait quality.  Pt then reporting urgent need to have a BM. Therefore, returned to room with totalA in w/c and wheeled in bathroom. Performed stand<>pivot with minA and use of grab bar from w/c to toilet. Wife assisted with lower body dressing in standing. Pt continent of bowel, charted in flowsheets. Wife also assisting with pericare and performing stand<>pivot transfer back to w/c, PT performed distant supervision and wife continues to show great safety awareness with functional transfers.  Pt requesting to remain seated in w/c at end of session. Needs within reach and wife remains at bedside.   Therapy Documentation Precautions:  Precautions Precautions: Fall,Other (comment) Precaution Comments: L hemi, neglect Restrictions Weight Bearing Restrictions: No General:    Therapy/Group:  Individual Therapy  Sequoyah Counterman P [place] Millon PT 05/13/2020, 7:44 AM

## 2020-05-14 LAB — GLUCOSE, CAPILLARY: Glucose-Capillary: 113 mg/dL — ABNORMAL HIGH (ref 70–99)

## 2020-05-14 NOTE — Progress Notes (Signed)
Speech Language Pathology Daily Session Note  Patient Details  Name: Anthony Huber MRN: [id number] Date of Birth: 1960-09-12  Today's Date: 05/14/2020 SLP Individual Time: 1017-1100 SLP Individual Time Calculation (min): 43 min  Short Term Goals: Week 5: SLP Short Term Goal 1 (Week 5): STG=LTG (ELOS: 5/13)  Skilled Therapeutic Interventions:Skilled ST services focused on cognitive skills. Today's session was frustrating and limited due to interpretor and pt's wife continuing to provide extra cuing and information, despite constant education for SLP. SLP facilitated basic problem solving and right scanning in novel PEG design task. Pt required initial min A verbal cues to start pattern on the right and place PEGs in appropriate areas on the right. Pt required overall mod A fade to min A verbal cues to complete pattern (1/2 through each task), however judgement of pt's skills was limited by interference from others. Pt expressed continued intermittent blurriness in right eye impacting function/vision. Pt was left in room with wife, call bell within reach and chair alarm set. SLP recommends to continue skilled services.      Pain Pain Assessment Pain Scale: 0-10 Pain Score: 0-No pain  Therapy/Group: Individual Therapy  Shaquille Murdy  [facility] 05/14/2020, 11:36 AM

## 2020-05-14 NOTE — Progress Notes (Incomplete)
Resting in bed  Notified son to interpret care questions and any concerns from patient , wife at [place] Skin warm wife at bedside

## 2020-05-14 NOTE — Progress Notes (Signed)
Occupational Therapy Session Note  Patient Details  Name: Anthony Huber MRN: [id number] Date of Birth: Mar 13, 1960  Today's Date: 05/14/2020 OT Individual Time: 0900-1013 OT Individual Time Calculation (min): 73 min    Short Term Goals: Week 1:  OT Short Term Goal 1 (Week 1): pt will sit EOB wiht MAX A of 1 for 3 min in prep for ADL OT Short Term Goal 1 - Progress (Week 1): Met OT Short Term Goal 2 (Week 1): Pt will manage LUE with multimodal cuing in prep for UB dressing OT Short Term Goal 2 - Progress (Week 1): Progressing toward goal OT Short Term Goal 3 (Week 1): Pt will intiate during transfer training 75% of trials to demo improved command following OT Short Term Goal 3 - Progress (Week 1): Met OT Short Term Goal 4 (Week 1): Pt will groom with MOD A OT Short Term Goal 4 - Progress (Week 1): Met Week 2:  OT Short Term Goal 1 (Week 2): Pt will be able to sit EOB with mod A in prep for self care. OT Short Term Goal 1 - Progress (Week 2): Met OT Short Term Goal 2 (Week 2): Pt will demonstrate improved L side awareness with bathing UB with min a. OT Short Term Goal 2 - Progress (Week 2): Met OT Short Term Goal 3 (Week 2): Pt will demonstrate improved apraxia to don shirt with mod a. OT Short Term Goal 3 - Progress (Week 2): Met OT Short Term Goal 4 (Week 2): Pt will transfer bed>< wc with mod A of 2. OT Short Term Goal 4 - Progress (Week 2): Met Week 3:  OT Short Term Goal 1 (Week 3): Pt will complete sq pivot transfer from wc >< BSC with mod A of 1. OT Short Term Goal 1 - Progress (Week 3): Met OT Short Term Goal 2 (Week 3): Pt will be able to sit to stand with min A of 1 and hold balance with mod A to prep for clothing management. OT Short Term Goal 2 - Progress (Week 3): Met OT Short Term Goal 3 (Week 3): Pt will be able to don a shirt with min A. OT Short Term Goal 3 - Progress (Week 3): Met Week 4:  OT Short Term Goal 1 (Week 4): Pt will don a shirt with S demonstrating improved L  side awareness. OT Short Term Goal 1 - Progress (Week 4): Progressing toward goal OT Short Term Goal 2 (Week 4): Pt will transfer to [facility] with CGA to min A. OT Short Term Goal 2 - Progress (Week 4): Met OT Short Term Goal 3 (Week 4): From [facility] pt will self cleanse with min A to support his balance. OT Short Term Goal 3 - Progress (Week 4): Progressing toward goal  Skilled Therapeutic Interventions/Progress Updates:    Pt received in room with spouse and interpreter present and consented to OT tx. Pt refused shower/ADLs this day. Session focused on functional transfer training, sitting balance, RUE strengthening HEP, and LUE NMR.  All following activities completed while sitting unsupported on edge of mat for increased core strength and trunk control for ADLs. Sabo applied to L wrist extensors for 15 mins to increase strength and improve tone in LUE. While Sabo was on LUE, trained pt in [place] with 2# db to increase strength and activity tolerance for ADLs. Pt instructed in elbow flexion, chest press, and shoulder press for 3x10 with min cuing for proper technique with good carryover. Pt  req CGA for sitting balance during exercises. Afterwards, pt instructed in weightbearing activity while e-stim applied to L triceps, 50mCC for 20 mins attended as therapist would block L elbow for weightbearing through LUE while e-stim activated for improved tone and increased strength. 10 seconds on, 10 seconds off with 2 second ramp. Finally, pt instructed in functional reaching task, utilizing card matching activity to promote visual scanning and functional reach with forward trunk flexion for increased trunk stability during ADLs. Pt requires increased time and cuing to scan to find correct card matches. After tx, spouse, interpreter, and pt handed off to SLP in room.   Therapy Documentation Precautions:  Precautions Precautions: Fall,Other (comment) Precaution Comments: L hemi,  neglect Restrictions Weight Bearing Restrictions: No   Vital Signs: Therapy Vitals Temp: 98.2 F (36.8 C) Pulse Rate: 100 Resp: 19 BP: 106/69 Patient Position (if appropriate): Sitting Oxygen Therapy SpO2: 97 % O2 Device: Room Air Pain: Pain Assessment Pain Scale: 0-10 Pain Score: 0-No pain   Therapy/Group: Individual Therapy  Neta Upadhyay 05/14/2020, 11:01 AM

## 2020-05-14 NOTE — Progress Notes (Addendum)
Nutrition Follow-up  DOCUMENTATION CODES:  Not applicable  INTERVENTION:  ContinueGlucerna Shake po TID, each supplement provides 220 kcal and 10 grams of protein.  Continue MagicCupBID with meals, each supplement provides 290 kcal and 9 grams of protein.  Continue MVI with minerals daily.  Encourage adequate PO intake.  NUTRITION DIAGNOSIS:  Increased nutrient needs related to chronic illness (CAD, DM) as evidenced by estimated needs. - ongoing  GOAL:  Patient will meet greater than or equal to 90% of their needs - variably meeting  MONITOR:  PO intake,Supplement acceptance,Labs,Weight trends,Skin,I & O's  REASON FOR ASSESSMENT:  Consult Assessment of nutrition requirement/status  ASSESSMENT:  Anthony Huber is a 60 year old right-handed male with history of open ureterlithotomy 2005 in [place] as well as left PCNL and laser ureterotomy October 2018 per Dr. Marlou Porch, CAD, diabetes mellitus and hypertension with remote tobacco use.  Per Epic, meal completion was 100% on 5/7, but has decreased with 50-100% on 5/8 and 40-100% on 5/9.  Pt unavailable x2 for RD visit. Spoke with LPN. She reports that pt is getting tired of hospital food and is eating less of his meals from here. His wife is bringing [place] food from their culture, which he does eat. He is also drinking all 3 Glucernas daily.  Weight has continued to trend down slightly. Most current wt is 44.5 kg, slightly lower than 44.9 kg on 4/27. Admit wt: 48.9 kg Current wt: 44.5 kg  Recommend continuing Glucerna TID, Magic Cup BID, and MVI with minerals daily. Any PO intake is preferred, so if pt prefers homemade food from wife, this is fine, as long as he is eating PO.  Medications: reviewed; Glucerna shake TID, metformin, MVI with minerals, Protonix, Senokot  Labs: reviewed; Na 134, CBG 99-130  Diet Order:   Diet Order            Diet Carb Modified Fluid consistency: Thin; Room service appropriate? Yes with Assist   Diet effective now                EDUCATION NEEDS:  Education needs have been addressed  Skin:  Skin Assessment: Reviewed RN Assessment  Last BM:  05/05/20  Height:  Ht Readings from Last 1 Encounters:  04/11/20 4\' 11"  (1.499 m)   Weight:  Wt Readings from Last 1 Encounters:  05/12/20 44.5 kg   Ideal Body Weight:  44.5 kg  BMI:  Body mass index is 19.81 kg/m.  Estimated Nutritional Needs:  Kcal:  1450-1650 Protein:  65-80 grams Fluid:  > 1.4 L  07/12/20, RD, LDN Registered Dietitian After Hours/Weekend Pager # in [place]

## 2020-05-14 NOTE — Progress Notes (Signed)
PROGRESS NOTE   Subjective/Complaints: Patient seen laying in bed this AM.  Wife at bedside appears to indicate penile pain and patient feeling cold.  Per nursing, fever overnight.    ROS: limited by language  Objective:   No results found. No results for input(s): WBC, HGB, HCT, PLT in the last 72 hours. Recent Labs    05/13/20 1253  NA 134*  K 4.7  CL 103  CO2 22  GLUCOSE 141*  BUN 25*  CREATININE 1.14  CALCIUM 9.8    Intake/Output Summary (Last 24 hours) at 05/14/2020 1204 Last data filed at 05/13/2020 1834 Gross per 24 hour  Intake 297 ml  Output 350 ml  Net -53 ml        Physical Exam: Vital Signs Blood pressure 106/69, pulse 100, temperature 98.2 F (36.8 C), resp. rate 19, height 4\' 11"  (1.499 m), weight 44.5 kg, SpO2 97 %.  Constitutional: No distress . Vital signs reviewed. HENT: Normocephalic.  Atraumatic. Eyes: EOMI. No discharge. Cardiovascular: No JVD.  RRR. Respiratory: Normal effort.  No stridor.  Bilateral clear to auscultation. GI: Non-distended.  BS +. Skin: Warm and dry.  Intact. Psych: Normal mood.  Normal behavior. Musc: No edema in extremities.  No tenderness in extremities. Neurological: Alert Left facial weakness, stable Left gaze preference, ?improving LUE: 1+-2-/5 proximal to distal with significant apraxia, stable LLE: HF 2+/5, KE 3/5, ADF 1+/5 with apraxia, No increase in tone noted   Assessment/Plan: 1. Functional deficits which require 3+ hours per day of interdisciplinary therapy in a comprehensive inpatient rehab setting.  Physiatrist is providing close team supervision and 24 hour management of active medical problems listed below.  Physiatrist and rehab team continue to assess barriers to discharge/monitor patient progress toward functional and medical goals  Care Tool:  Bathing    Body parts bathed by patient: Face,Chest,Abdomen,Left arm,Front perineal area,Right  upper leg,Left upper leg,Right lower leg,Left lower leg   Body parts bathed by helper: Right arm,Buttocks     Bathing assist Assist Level: Minimal Assistance - Patient > 75%     Upper Body Dressing/Undressing Upper body dressing   What is the patient wearing?: Pull over shirt    Upper body assist Assist Level: Contact Guard/Touching assist    Lower Body Dressing/Undressing Lower body dressing      What is the patient wearing?: Pants     Lower body assist Assist for lower body dressing: Minimal Assistance - Patient > 75%     Toileting Toileting    Toileting assist Assist for toileting: Maximal Assistance - Patient 25 - 49%     Transfers Chair/bed transfer  Transfers assist     Chair/bed transfer assist level: Minimal Assistance - Patient > 75%     Locomotion Ambulation   Ambulation assist   Ambulation activity did not occur: Safety/medical concerns  Assist level: 2 helpers (+2 mod A) Assistive device: Other (comment) (3 Musketeer) Max distance: 60ft   Walk 10 feet activity   Assist  Walk 10 feet activity did not occur: Safety/medical concerns  Assist level: 2 helpers (+2 mod A) Assistive device: Other (comment) (3 Musketeer)   Walk 50 feet activity   Assist  Walk 50 feet with 2 turns activity did not occur: Safety/medical concerns  Assist level: 2 helpers (+2 mod A) Assistive device: Other (comment) (3 Musketeer)    Walk 150 feet activity   Assist Walk 150 feet activity did not occur: Safety/medical concerns         Walk 10 feet on uneven surface  activity   Assist Walk 10 feet on uneven surfaces activity did not occur: Safety/medical concerns         Wheelchair     Assist Will patient use wheelchair at discharge?: Yes Type of Wheelchair: Manual Wheelchair activity did not occur: Safety/medical concerns  Wheelchair assist level: Supervision/Verbal cueing (max cuing) Max wheelchair distance: 189ft    Wheelchair 50 feet  with 2 turns activity    Assist    Wheelchair 50 feet with 2 turns activity did not occur: Safety/medical concerns       Wheelchair 150 feet activity     Assist  Wheelchair 150 feet activity did not occur: Safety/medical concerns       Medical Problem List and Plan: 1.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 Impaired mobility -DVT/anticoagulation:Continue Subcutaneous heparin initiated for 02/25/2020 -antiplatelet therapy: N/A 3. Lower back pain:continue Tylenol as needed. D/c tramadol. Added kpad and lidocaine patch overnight  Controlled on 5/10 4. Mood:Provide emotional support -antipsychotic agents: N/A 5. Neuropsych: This patientis notcapable of making decisions on hisown behalf. 6. Skin/Wound Care:Routine skin checks 7. Fluids/Electrolytes/Nutrition:Routine in and outs. 8. Hypertension.              Continue Norvasc 10 mg daily             Lopressor 50 mg twice daily.              D/c'ed clonidine.              Controlled on 5/10 9. History of open Uretlithotomy2005 in [place] as well as left PCNL and laser ureterotomyOctober 2018 followed by Dr. Marlou Porch. 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  -CONTINUE FOLEY TUBE.DO NOT REMOVE .Foley catheter to remain in place until follow-up in the office of Dr. Modena Slater for cystogram.  Will follow up with Urology regarding replacement give symptoms  Plan to order UA/Ucx afterward 10. Diet controlled diabetes mellitus. Hemoglobin A1c 5.8. SSI  Educate pt/family as needed  Metformin decreased back to 500mg  daily and restarted CBG checks. Continue metformin  Relatively controlled on 5/10 11.  Leukocytosis  WBCs 10.6 on 5/6, labs ordered for tomorrow              Afebrile             Recheck 4/19 12.  Acute blood loss anemia             Hemoglobin 13.4 on 5/6             Recheck 4/19 13.  CKD              Creatinine 1.14 on 5/9             Encourage fluids, discussed again 14.  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  Slow transit constipation             Bowel meds increased on 4/8             Improved 16. Lethargy:   Continue Ritalin 5mg  BID, will plan on weaning this week  Improving 17. Emesis              Appears to have resolved              Changed Lipitor to HS to spread out medications     LOS: 33 days A FACE TO FACE EVALUATION WAS PERFORMED  Leitha Hyppolite 05/14/2020, 12:04 PM

## 2020-05-14 NOTE — Progress Notes (Incomplete)
qNotified by staff NT of patient shaking visually with several blankets placed on him in bed by spouse,she indicating he is cold, VS monitored and recorded at

## 2020-05-14 NOTE — Progress Notes (Signed)
Physical Therapy Session Note  Patient Details  Name: Anthony Huber MRN: [id number] Date of Birth: 11-09-1960  Today's Date: 05/14/2020 PT Individual Time: 1638-4536 PT Individual Time Calculation (min): 54 min   Short Term Goals: Week 5:  PT Short Term Goal 1 (Week 5): STG = LTG due to ELOS  Skilled Therapeutic Interventions/Progress Updates:    Pt received supine in bed, wife and interpreter at bedside. Pt consenting to PT tx. No reports of pain. Supine<>sit with minA with hospital bed features. RN arriving for medications which pt took while unsupported sitting at EOB. Donned tennis shoes with totalA for time management and performed squat<>pivot transfer with minA to w/c. Wife called Son to confirm bed height at home = 22 inches. Pt transported to main rehab gym for time management. Wife completed stand<>pivot transfer with therapist providing supervision for safety. Wife continues to show great safety awareness during transfer and manages well with L knee block. Practiced bed mobility on mat table with mat height set to 22 inches. Wife attempting to assist patient without need - encouraged her to allow pt to achieve as much as he can prior to offering assistance. Pt completed several repetitions for sit<>supine on flat mat table, requires grossly minA for trunk elevation and LLE management. Wife performed additional stand<>pivot transfer with similar technique above to w/c. W/c transport to ortho gym for time management and focused remainder of session on functional car transfers. Car height set to that of a small sedan. PT provided demonstration of car transfer with L knee block - needs assist as well for trunk management during BLE management in/out of car. Wife performed several repetitions of car transfers with therapist providing CGA - pt requires a heavy minA for car transfers. Pt transported back to room for time management and pt requesting to remain seated in w/c. Needs within reach, wife at  bedside.   Therapy Documentation Precautions:  Precautions Precautions: Fall,Other (comment) Precaution Comments: L hemi, neglect Restrictions Weight Bearing Restrictions: No General:    Therapy/Group: Individual Therapy  Orrin Brigham 05/14/2020, 7:42 AM

## 2020-05-14 NOTE — Progress Notes (Signed)
Pt is in [place]: PA Dan A notified and awaiting orders from urology concerning changing pt  foley.

## 2020-05-15 LAB — CBC WITH DIFFERENTIAL/PLATELET
Abs Immature Granulocytes: 0.07 10*3/uL (ref 0.00–0.07)
Basophils Absolute: 0.1 10*3/uL (ref 0.0–0.1)
Basophils Relative: 1 %
Eosinophils Absolute: 2 10*3/uL — ABNORMAL HIGH (ref 0.0–0.5)
Eosinophils Relative: 11 %
HCT: 38.5 % — ABNORMAL LOW (ref 39.0–52.0)
Hemoglobin: 12.2 g/dL — ABNORMAL LOW (ref 13.0–17.0)
Immature Granulocytes: 0 %
Lymphocytes Relative: 13 %
Lymphs Abs: 2.4 10*3/uL (ref 0.7–4.0)
MCH: 22 pg — ABNORMAL LOW (ref 26.0–34.0)
MCHC: 31.7 g/dL (ref 30.0–36.0)
MCV: 69.4 fL — ABNORMAL LOW (ref 80.0–100.0)
Monocytes Absolute: 1.4 10*3/uL — ABNORMAL HIGH (ref 0.1–1.0)
Monocytes Relative: 8 %
Neutro Abs: 12 10*3/uL — ABNORMAL HIGH (ref 1.7–7.7)
Neutrophils Relative %: 67 %
Platelets: 221 10*3/uL (ref 150–400)
RBC: 5.55 MIL/uL (ref 4.22–5.81)
RDW: 15.4 % (ref 11.5–15.5)
WBC: 18 10*3/uL — ABNORMAL HIGH (ref 4.0–10.5)
nRBC: 0 % (ref 0.0–0.2)

## 2020-05-15 NOTE — Progress Notes (Signed)
PROGRESS NOTE   Subjective/Complaints: Patient seen laying in bed this AM. Wife at bedside.  She indicates he slept well overnight.  He denies fevers, feeling cold, penile pain today.   ROS: limited by language.   Objective:   No results found. Recent Labs    05/15/20 0426  WBC 18.0*  HGB 12.2*  HCT 38.5*  PLT 221   Recent Labs    05/13/20 1253  NA 134*  K 4.7  CL 103  CO2 22  GLUCOSE 141*  BUN 25*  CREATININE 1.14  CALCIUM 9.8    Intake/Output Summary (Last 24 hours) at 05/15/2020 1010 Last data filed at 05/15/2020 0900 Gross per 24 hour  Intake 195 ml  Output 475 ml  Net -280 ml        Physical Exam: Vital Signs Blood pressure 102/63, pulse 70, temperature 97.7 F (36.5 C), temperature source Oral, resp. rate 20, height 4\' 11"  (1.499 m), weight 44.5 kg, SpO2 96 %.  Constitutional: No distress . Vital signs reviewed. HENT: Normocephalic.  Atraumatic. Eyes: EOMI. No discharge. Cardiovascular: No JVD.  RRR. Respiratory: Normal effort.  No stridor.  Bilateral clear to auscultation. GI: Non-distended.  BS +. Skin: Warm and dry.  Intact. Psych: Normal mood.  Normal behavior. Musc: No edema in extremities.  No tenderness in extremities. Neurological: Alert Left facial weakness, unchanged Left gaze preference, ?improving LUE: 1+-2-/5 proximal to distal with significant apraxia, improving LLE: HF 2+/5, KE 3/5, ADF 1+/5 with apraxia, stable No increase in tone noted   Assessment/Plan: 1. Functional deficits which require 3+ hours per day of interdisciplinary therapy in a comprehensive inpatient rehab setting.  Physiatrist is providing close team supervision and 24 hour management of active medical problems listed below.  Physiatrist and rehab team continue to assess barriers to discharge/monitor patient progress toward functional and medical goals  Care Tool:  Bathing    Body parts bathed by  patient: Face,Chest,Abdomen,Left arm,Front perineal area,Right upper leg,Left upper leg,Right lower leg,Left lower leg   Body parts bathed by helper: Right arm,Buttocks     Bathing assist Assist Level: Minimal Assistance - Patient > 75%     Upper Body Dressing/Undressing Upper body dressing   What is the patient wearing?: Pull over shirt    Upper body assist Assist Level: Contact Guard/Touching assist    Lower Body Dressing/Undressing Lower body dressing      What is the patient wearing?: Pants     Lower body assist Assist for lower body dressing: Minimal Assistance - Patient > 75%     Toileting Toileting    Toileting assist Assist for toileting: Maximal Assistance - Patient 25 - 49%     Transfers Chair/bed transfer  Transfers assist     Chair/bed transfer assist level: Minimal Assistance - Patient > 75%     Locomotion Ambulation   Ambulation assist   Ambulation activity did not occur: Safety/medical concerns  Assist level: 2 helpers (+2 mod A) Assistive device: Other (comment) (3 Musketeer) Max distance: 74ft   Walk 10 feet activity   Assist  Walk 10 feet activity did not occur: Safety/medical concerns  Assist level: 2 helpers (+2 mod A) Assistive device:  Other (comment) (3 Musketeer)   Walk 50 feet activity   Assist Walk 50 feet with 2 turns activity did not occur: Safety/medical concerns  Assist level: 2 helpers (+2 mod A) Assistive device: Other (comment) (3 Musketeer)    Walk 150 feet activity   Assist Walk 150 feet activity did not occur: Safety/medical concerns         Walk 10 feet on uneven surface  activity   Assist Walk 10 feet on uneven surfaces activity did not occur: Safety/medical concerns         Wheelchair     Assist Will patient use wheelchair at discharge?: Yes Type of Wheelchair: Manual Wheelchair activity did not occur: Safety/medical concerns  Wheelchair assist level: Supervision/Verbal cueing (max  cuing) Max wheelchair distance: 148ft    Wheelchair 50 feet with 2 turns activity    Assist    Wheelchair 50 feet with 2 turns activity did not occur: Safety/medical concerns       Wheelchair 150 feet activity     Assist  Wheelchair 150 feet activity did not occur: Safety/medical concerns       Medical Problem List and Plan: 1.Left hemiparesissecondary 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 Please see conference note from today as well.  2. Impaired mobility -DVT/anticoagulation:Continue Subcutaneous heparin initiated for 02/25/2020 -antiplatelet therapy: N/A 3. Lower back pain:continue Tylenol as needed. D/c tramadol. Added kpad and lidocaine patch overnight  Controlled on 5/11 4. Mood:Provide emotional support -antipsychotic agents: N/A 5. Neuropsych: This patientis notcapable of making decisions on hisown behalf. 6. Skin/Wound Care:Routine skin checks 7. Fluids/Electrolytes/Nutrition:Routine in and outs. 8. Hypertension.              Continue Norvasc 10 mg daily             Lopressor 50 mg twice daily.              D/c'ed clonidine.              Controlled on 5/11 9. History of open Uretlithotomy2005 in [place] as well as left PCNL and laser ureterotomyOctober 2018 followed by Dr. Marlou Porch. 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   CONTINUE FOLEY TUBE.DO NOT REMOVE .Foley catheter to remain in place until follow-up in the office of Dr. Modena Slater for cystogram.  Will follow up with Urology regarding replacement give symptoms, awaiting eval 10. Diet controlled diabetes  mellitus. Hemoglobin A1c 5.8. SSI  Educate pt/family as needed  Metformin decreased back to 500mg  daily and restarted CBG checks. Continue metformin  Relatively controlled on 5/11 11.  Leukocytosis  WBCs 18.0 on 5/11, labs ordered for tomorrow             Afebrile             Recheck 4/19 12.  Acute blood loss anemia             Hemoglobin 12.2 on 5/11, labs ordered for tomorrow             Recheck 4/19 13.  CKD  Creatinine 1.14 on 5/9             Encourage fluids, discussed again 14.  Gout and OA- in right knee             X-ray ordered showing mild degenerative changes with small effusion             Voltaren gel             Colchicine restarted             Improved 15.  Slow transit constipation             Bowel meds increased on 4/8             Improved 16. Lethargy:   Continue Ritalin 5mg  BID, will plan on weaning this week  Improving 17. Emesis              Appears to have resolved              Changed Lipitor to HS to spread out medications 18. Likely UTI  Will order UA/Ucx after cath change  Will likely requre abx  See #9,11    LOS: 34 days A FACE TO FACE EVALUATION WAS PERFORMED  Cheral Cappucci 05/15/2020, 10:10 AM

## 2020-05-15 NOTE — Progress Notes (Signed)
Speech Language Pathology Daily Session Note  Patient Details  Name: Anthony Huber MRN: [id number] Date of Birth: [birth year]  Today's Date: 05/15/2020 SLP Individual Time: 1020-1100 SLP Individual Time Calculation (min): 40 min  Short Term Goals: Week 5: SLP Short Term Goal 1 (Week 5): STG=LTG (ELOS: 5/13)  Skilled Therapeutic Interventions:   Patient seen for skilled ST session with wife and live interpreter present. Patient sitting up in [facility] when SLP arrived. He was much more interactive today and SLP only needed to give minA cues to elaborate/more fully describe/respond during therapy tasks. He was able to correctly identify problem in action photos and was 80% accurate in describing logical solution. SLP observed patient and wife while she assisted him in [facility] to bed transfer. Wife performed all steps accurately without cues and patient did lock brake on one side of his WC. Wife continues to provide more assistance than is needed but as SLP is not completely familiar with PT's instructions for her during transfers. SLP spoke with PT later on in morning and confirmed that patient's wife does indeed provide more assistance than needed. SLP plans to provide further education to patient and wife regarding this and other recommendations to prepare him for discharge 5/13.  Pain Pain Assessment Pain Scale: Faces Faces Pain Scale: No hurt  Therapy/Group: Individual Therapy   Angela Nevin, MA, CCC-SLP Speech Therapy

## 2020-05-15 NOTE — Progress Notes (Signed)
Patient ID: Anthony Huber, male   DOB: 12-04-1960, 60 y.o.   MRN: [id number]  Spoke with son via telephone to inform of team conference progress and education for going home this Friday. He voiced his Mom feels ready and Dad wants to go home has been in hospital too long. Equipment ordered and home health arranged. Prepare for discharge Friday 5/13. Wife working with PT on car transfers this week.

## 2020-05-15 NOTE — Progress Notes (Signed)
Physical Therapy Session Note  Patient Details  Name: Anthony Huber MRN: [id number] Date of Birth: 03/04/1960  Today's Date: 05/15/2020 PT Individual Time: 0900-1015 PT Individual Time Calculation (min): 75 min   Short Term Goals: Week 5:  PT Short Term Goal 1 (Week 5): STG = LTG due to ELOS  Skilled Therapeutic Interventions/Progress Updates:    Pt greeted supine in bed, wife at bedside. Interpreter arriving later during session. Pt agreeable to PT session and denies pain. Supine<>sit with minA with bedrail. Donned tennis shoes with totalA for time management and completed squat<>pivot transfer with minA to w/c. Pt propelled himself with wife assist to ortho gym. Completed NMR while seated in w/c with BITS system: -Trail making: Worked on sequencing and task attenuation with trail making #1-#25. Encouraged wife to allow pt to complete himself for NMR but she continued to assist with verbal and visual cues, so therefore, accuracy was skewed. Completed with x7 errors and x26 interruptions -Reaction test: Reaction time of 3.75 seconds with time impacted due to L inattention and cues needed for L visual scanning. Accuracy 90%. -Maze test: Wife providing verbal and visual cues to assist but pt had good understanding of rules and purpose. Able to complete Maze test with x7 errors in a timely manner. -Bell cancellation Test: Again, reminded wife and interpreter to allow patient to complete without aid but they continued to provide assist despite instruction. Pt with obvious L inattention and poor ability to visually scan without cues. -Clock Test: See below for image when pt was instructed to draw "A quarter past 7." Language barrier may be impacting understanding and interpreter pointed to his watch, thus, pt drew a watch with time dictated.    Continued NMR with EMPI select for Large atrophy quad activation and small atrophy anterior tib muscle group activation. 47 hz for quad. 60hz  for ant tib with 6  second on/off time and 2 second ramp up/down.  Completed several repetitions, x5 total, of car transfers with PT providing close supervision and wife performing transfer. Wife demonstrating appropriate setup and technique with guarding. Reminded her to allow pt to do as much as he can by himself as he is only a minA transfer. Also educated on body mechanics to avoid hurting herself.  Pt wheeled back to his room and he remained seated in w/c at end of session with wife and interpreter at bedside. Needs within reach.   Therapy Documentation Precautions:  Precautions Precautions: Fall,Other (comment) Precaution Comments: L hemi, neglect Splint/Cast - Date Prophylactic Dressing Applied (if applicable):  (left hand splint/left boot applied) Restrictions Weight Bearing Restrictions: No General:     Therapy/Group: Individual Therapy  Pierra Skora P Yovanni Frenette PT 05/15/2020, 7:35 AM

## 2020-05-15 NOTE — Discharge Summary (Addendum)
Physician Discharge Summary  Patient ID: Anthony Huber MRN: [id number] DOB/AGE: May 26, 1960 60 y.o.  Admit date: 04/11/2020 Discharge date: 05/17/2020  Discharge Diagnoses:  Principal Problem:   ICH (intracerebral hemorrhage) (HCC) Active Problems:   Slow transit constipation   Chronic gout of right knee   Acute blood loss anemia   Leukocytosis   Controlled type 2 diabetes mellitus with hyperglycemia, without long-term current use of insulin (HCC)   Essential hypertension   AKI (acute kidney injury) (HCC)   Benign essential HTN   Pain Vesicocutaneous fistula  Discharged Condition: Stable  Significant Diagnostic Studies: DG Knee Complete 4 Views Right  Result Date: 04/29/2020 CLINICAL DATA:  Right knee pain and immobility for unknown period of time. EXAM: RIGHT KNEE - COMPLETE 4+ VIEW COMPARISON:  None. FINDINGS: Normal alignment and joint spaces. Trace peripheral spurring of the patellofemoral compartment. Probable subchondral cystic change in the patellofemoral compartment. Small joint effusion. No erosion or bone destruction. No fracture. IMPRESSION: Mild osteoarthritis of the patellofemoral compartment with small joint effusion. Electronically Signed   By: Narda Rutherford M.D.   On: 04/29/2020 16:33    Labs:  Basic Metabolic Panel: Recent Labs  Lab 05/13/20 1253  NA 134*  K 4.7  CL 103  CO2 22  GLUCOSE 141*  BUN 25*  CREATININE 1.14  CALCIUM 9.8    CBC: Recent Labs  Lab 05/15/20 0426 05/16/20 0540  WBC 18.0* 12.5*  NEUTROABS 12.0* 7.2  HGB 12.2* 12.1*  HCT 38.5* 38.3*  MCV 69.4* 69.0*  PLT 221 270    CBG: Recent Labs  Lab 05/13/20 0617 05/13/20 1118 05/13/20 1630 05/13/20 2059 05/14/20 0345  GLUCAP 99 106* 115* 130* 113*   Family history.  Positive for hypertension and hyperlipidemia.  Denies any colon cancer esophageal cancer or rectal cancer  Brief HPI:   Anthony Huber is a 60 y.o. right-handed male with history of open ureterolithotomy 2005 in [place]  as well as left PCNL and laser ureterotomy October 2018 per Dr. Marlou Porch, CAD, diabetes mellitus, hypertension and remote tobacco abuse.  Per chart review lives with spouse independent prior to admission 1 level home.  Works at a [employer], [employer].  Presented 04/06/2020 with altered mental status headache and left-sided weakness.  Blood pressure 170/100.  Cranial CT scan showed a 4.3 x 2.4 x 3.4 cm acute intraparenchymal hemorrhage centered at the right basal ganglia.  Localized mass-effect with up to 5 mm right to left shift.  Admission chemistries unremarkable except hemoglobin A1c 5.8 WBC 15,700.  MRI/MRI showed stable size and morphology of right basal ganglia intraparenchymal hemorrhage with similar surrounding vasogenic edema and regional mass-effect.  Associated 5 mm right to left shift.  No intraventricular extension hydrocephalus or ventricular trapping.  MRA was unremarkable.  Echocardiogram with ejection fraction of 65 to 70% no wall motion abnormalities.  Maintained on Cleviprex for blood pressure control.  Patient was cleared to begin subcutaneous heparin for DVT prophylaxis 04/06/2020.  Hospital course follow-up urology services 04/08/2020 for evaluation of leaking urine from incision line which had been intermittent since ureterotomy.  CT abdomen pelvis showed several tiny stones in the distal left ureter.  Moderate left hydroureter and severe left hydronephrosis.  Severe left renal parenchymal atrophy and cortical thinning secondary to chronic hydronephrosis.  Linear band to the proximal left ureter with focal luminal narrowing likely representing adhesion or scarring with 2 punctate nonobstructive left renal inferior pole calculi and fluid cultures were sent and Foley tube inserted.  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  Therapy evaluations completed due to patient's decreased  functional mobility was admitted for a comprehensive rehab program.   Hospital Course: Anthony Huber was admitted to rehab 04/11/2020 for inpatient therapies to consist of PT, ST and OT at least three hours five days a week. Past admission physiatrist, therapy team and rehab RN have worked together to provide customized collaborative inpatient rehab.  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  Patient had been placed on Ritalin to help establish maintenance of focus and attention to tasks.  He had been cleared to begin subcutaneous heparin for DVT prophylaxis during his hospital course 04/06/2020.  No bleeding episodes.  Patient has some intermittent low back pain using Tylenol as needed the addition of a K pad as well as Lidoderm patch.  Blood pressure controlled on Norvasc and Lopressor blood pressure somewhat soft clonidine has been discontinued.  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  Blood sugars monitored hemoglobin A1c 5.8 metformin as directed with diabetic teaching.  AKI/CKD latest creatinine 1.14 and monitored.  Suspect gout versus osteoarthritis right knee x-ray showed mild degenerative change small effusion Voltaren gel added placed on low-dose colchicine.   Blood pressures were monitored on TID basis and soft and monitored  Diabetes has been monitored with ac/hs CBG checks and SSI was use prn for tighter BS control.    Rehab course: During patient's stay in rehab weekly team conferences were held to monitor patient's progress, set goals and discuss barriers to discharge. At admission, patient required max assist stand pivot transfers max assist  squat pivot transfers max assist side-lying to sitting moderate assist for rolling max assist upper body bathing total assist lower body bathing max a set body dressing total assist lower body dressing  Physical exam.  Blood pressure 134/83 pulse 78 temperature 98.9 respirations 18 oxygen saturation 97% room air Constitutional.  No acute distress HEENT Head.  Normocephalic and atraumatic Eyes.  Pupils round and reactive to light no discharge without nystagmus Neck.  Supple nontender no JVD without thyromegaly Cardiac regular rate rhythm without extra sounds or murmur heard Abdomen.  Soft nontender positive bowel sounds without rebound Respiratory effort normal no respiratory distress without wheeze Musculoskeletal.  No swelling normal range of motion Skin.  Warm and dry Neurologic.  Alert follows commands there is a language barrier.  Seems to track to all 4 quadrants.  Moves right side spontaneously.  Left upper left lower extremity 0/5.  He/She  has had improvement in activity tolerance, balance, postural control as well as ability to compensate for deficits. He/She has had improvement in functional use RUE/LUE  and RLE/LLE as well as improvement in awareness.  Supine to sit with minimal assist.  Donned tennis shoes with total assist for time management.  Perform squat pivot transfers minimal assist to wheelchair.  Wife completed stand pivot transfers with therapist providing supervision and education for safety.  Wife demonstrated good safety awareness.  Patient completed several repetitions for sit to supine on flat mat table requiring grossly minimal assist for trunk elevation.  Wife performed several repetitions of car transfers with  therapist providing contact-guard assist.  ADLs sessions focused on functional transfer training sitting balance.  All following activities completed while sitting unsupported on edge of mat for increased time and strength and trunk control.  Contact-guard assist  for sitting balance during exercises.  Speech therapy follow-up with assistance of interpreter SLP facilitated basic problem-solving and right scanning and novel peg design task.  Required initial minimal assist verbal cues to start pattern on the right and place pegs in appropriate areas on the right.  Required overall moderate fade to minimal verbal cues to complete pattern.  Full family teaching was completed discussion held with need for assistance for patient safety and discharged to home       Disposition: Discharged to home    Diet: Carb modified  Special Instructions: No driving smoking or alcohol  Follow-up urology services Dr. Marlou Porch [phone] in regards to vesicocutaneous fistula and plan of care  Medications at discharge 1.  Tylenol as needed 2.  Norvasc 10 mg p.o. daily 3.  Lipitor 40 mg p.o. nightly 4.  Colchicine 0.6 mg p.o. daily 5.  Voltaren gel 2 g 4 times daily to affected area 6.  Lidoderm patch change as directed 7.  Glucophage 500 mg p.o. daily with breakfast 8.  Ritalin 5 mg p.o. twice daily 9.  Lopressor 50 mg p.o. twice daily 10.  Multivitamin daily 11.  Protonix 40 mg p.o. daily 12.  MiraLAX daily hold for loose stools 13.  Senokot 1 tablet p.o. twice daily  30-35 minutes were spent completing discharge summary and discharge planning  Discharge Instructions     Ambulatory referral to Neurology   Complete by: As directed    An appointment is requested in approximately 4 weeks ICH related to hypertensive crisis   Ambulatory referral to Physical Medicine Rehab   Complete by: As directed    Moderate complexity follow-up 1 to 2 weeks ICH        Follow-up Information     Marcello Fennel, MD Follow up.   Specialty: Physical Medicine and Rehabilitation Why: Office to call for appointment Contact information: [address] [place] [address] [place] [address] [phone]         Crist Fat, MD Follow up.   Specialty: Urology Why:  Follow-up 05/20/2020 at 11 AM Contact information: [address] [place] [address] [phone]         Anders Simmonds, PA-C Follow up on 06/26/2020.   Specialty: Family Medicine Why: Appointment @ 2:10 PM Contact information: [address] [place] [address] [phone]                 Signed: Charlton Amor 05/17/2020, 5:16 AM Patient was seen, face-face, and physical exam performed by me on day of discharge, greater than 30 minutes of total time spent.. Please see progress note from day of discharge as well.  Maryla Morrow, MD, ABPMR

## 2020-05-15 NOTE — Progress Notes (Incomplete)
Spoke with son ro interpre discomfort tof pain states pain os 5/10 to lower back, was worser today

## 2020-05-15 NOTE — Progress Notes (Signed)
Inpatient Rehabilitation Care Coordinator Discharge Note  The overall goal for the admission was met for:   Discharge location: Yes-HOME WITH WIFE WHO CAN PROVIDE 24/7 CARE  Length of Stay: Yes-36 days  Discharge activity level: Yes-MIN-MOD LEVEL  Home/community participation: Yes  Services provided included: MD, RD, PT, OT, SLP, RN, CM, Pharmacy, Neuropsych and SW  Financial Services: Private Insurance: [place] offered to/list presented to:PT AND WIFE  Follow-up services arranged: Home Health: [facility]-PT,OT,SP,RN, DME: [facility]-3 IN 1 AND [facility]  [facility]-WHEELCHAIR and Patient/Family has no preference for HH/DME agencies  Comments (or additional information):WIFE WAS HERE DAILY AND PARTICIPATED IN [place]. AWARE 24/7 CARE AND FEELS COMFORTABLE WITH THIS. SON WAS IN EARLIER FOR EDUCATION  Patient/Family verbalized understanding of follow-up arrangements: Yes  Individual responsible for coordination of the follow-up plan: BOL-SON [phone]  Confirmed correct DME delivered: Elease Hashimoto 05/15/2020    Walker Sitar, Gardiner Rhyme

## 2020-05-15 NOTE — Patient Care Conference (Signed)
Inpatient RehabilitationTeam Conference and Plan of Care Update Date: 05/15/2020   Time: 11:06 AM    Patient Name: Anthony Huber      Medical Record Number: [id number]  Date of Birth: 1960-04-15 Sex: Male         Room/Bed: 4W20C/4W20C-01 Payor Info: Payor: BLUE CROSS BLUE SHIELD / Plan: BCBS COMM PPO / Product Type: *No Product type* /    Admit Date/Time:  04/11/2020  2:09 PM  Primary Diagnosis:  ICH (intracerebral hemorrhage) [facility])  Hospital Problems: Principal Problem:   ICH (intracerebral hemorrhage) (HCC) Active Problems:   Slow transit constipation   Chronic gout of right knee   Acute blood loss anemia   Leukocytosis   Controlled type 2 diabetes mellitus with hyperglycemia, without long-term current use of insulin (HCC)   Essential hypertension   AKI (acute kidney injury) (HCC)   Benign essential HTN   Pain    Expected Discharge Date: Expected Discharge Date: 05/17/20  Team Members Present: Physician leading conference: Dr. Maryla Morrow Care Coodinator Present: Chana Bode, RN, BSN, CRRN;Becky Dupree, LCSW Nurse Present: Chana Bode, RN PT Present: Wynelle Link, PT OT Present: Roney Mans, OT SLP Present: Other (comment) Fae Pippin, SLP) PPS Coordinator present : Fae Pippin, SLP     Current Status/Progress Goal Weekly Team Focus  Bowel/Bladder   Foley catheter remains intact, LBM 05/13/20  Remain continent bowel, maintain foley- no s/s of infection  Assess B/B qs/prn, Foley care BID   Swallow/Nutrition/ Hydration             ADL's   min A with overall self care, mod A with toileting, min A with functional transfers.  min A  LUE NMR, estim, balance, ADL training, visual compensatory strategies, pt/fam education   Mobility   minA bed mobility, minA squat<>pivot transfers, minA w/c propulsion via hemitechnique. Custom w/c delivered, family ed/training ongoing  Goals have been upgraded to minA. Will be w/c level  Dynamic sitting and standing balance,  functional transfers, family ed/training, DC planning, pt education   Communication   Mod-mIn A  Min A  education, pragmatics and expression at sentence level   Safety/Cognition/ Behavioral Observations  mod-min A  min A  education, right attention and basic problem solving   Pain   Pain Lower back pain continue Lidoderm patch and KPAD,right knee Voltaren cream and Tylenol prn  less than 3 out of 10  QS/PRN assessment   Skin   Skin intact  remain free of breakdown  Assess skin - QS/PRN     Discharge Planning:  Working on discharge plans for home with wife and son coming by to assist when not working. Using wheelchair going home with and have ordered other equipment   Team Discussion: Good progress overall. Issues with foley; MD requested GU follow up on foley/replacement- plan of care.  Patient on target to meet rehab goals: yes, currently min - mod for SLP with min assist goals for discharge. Min assit for squat pivot transfer snd wheelchair transfers.   *See Care Plan and progress notes for long and short-term goals.   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 secondary to hypertensive crisis  Barriers to Discharge: Medical stability;Other (comments)  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 Possible Resolutions to Barriers/Weekly Focus: Therapies, follow labs - Hb, WBCs, Cr, follow CBGs, await urology follow up for foley/UTI, patient and family edu   Continued Need for Acute  Rehabilitation Level of Care: The patient requires daily medical management by a physician with specialized training in physical medicine and rehabilitation for the following reasons: Direction of a multidisciplinary physical rehabilitation program to maximize functional independence : Yes Medical management of patient stability for increased activity during participation in an intensive rehabilitation regime.: Yes Analysis of laboratory values and/or radiology reports with any subsequent need for medication adjustment and/or medical intervention. : Yes   I attest that I was present, lead the team conference, and concur with the assessment and plan of the team.   Chana Bode B 05/15/2020, 1:57 PM

## 2020-05-15 NOTE — Progress Notes (Signed)
Occupational Therapy Session Note  Patient Details  Name: Anthony Huber MRN: [id number] Date of Birth: [birth year]  Today's Date: 05/15/2020 OT Individual Time: 1259-1400 OT Individual Time Calculation (min): 61 min    Short Term Goals: Week 1:  OT Short Term Goal 1 (Week 1): pt will sit EOB wiht MAX A of 1 for 3 min in prep for ADL OT Short Term Goal 1 - Progress (Week 1): Met OT Short Term Goal 2 (Week 1): Pt will manage LUE with multimodal cuing in prep for UB dressing OT Short Term Goal 2 - Progress (Week 1): Progressing toward goal OT Short Term Goal 3 (Week 1): Pt will intiate during transfer training 75% of trials to demo improved command following OT Short Term Goal 3 - Progress (Week 1): Met OT Short Term Goal 4 (Week 1): Pt will groom with MOD A OT Short Term Goal 4 - Progress (Week 1): Met Week 2:  OT Short Term Goal 1 (Week 2): Pt will be able to sit EOB with mod A in prep for self care. OT Short Term Goal 1 - Progress (Week 2): Met OT Short Term Goal 2 (Week 2): Pt will demonstrate improved L side awareness with bathing UB with min a. OT Short Term Goal 2 - Progress (Week 2): Met OT Short Term Goal 3 (Week 2): Pt will demonstrate improved apraxia to don shirt with mod a. OT Short Term Goal 3 - Progress (Week 2): Met OT Short Term Goal 4 (Week 2): Pt will transfer bed>< wc with mod A of 2. OT Short Term Goal 4 - Progress (Week 2): Met Week 3:  OT Short Term Goal 1 (Week 3): Pt will complete sq pivot transfer from wc >< BSC with mod A of 1. OT Short Term Goal 1 - Progress (Week 3): Met OT Short Term Goal 2 (Week 3): Pt will be able to sit to stand with min A of 1 and hold balance with mod A to prep for clothing management. OT Short Term Goal 2 - Progress (Week 3): Met OT Short Term Goal 3 (Week 3): Pt will be able to don a shirt with min A. OT Short Term Goal 3 - Progress (Week 3): Met Week 4:  OT Short Term Goal 1 (Week 4): Pt will don a shirt with S demonstrating improved L  side awareness. OT Short Term Goal 1 - Progress (Week 4): Progressing toward goal OT Short Term Goal 2 (Week 4): Pt will transfer to [facility] with CGA to min A. OT Short Term Goal 2 - Progress (Week 4): Met OT Short Term Goal 3 (Week 4): From [facility] pt will self cleanse with min A to support his balance. OT Short Term Goal 3 - Progress (Week 4): Progressing toward goal  Skilled Therapeutic Interventions/Progress Updates:     Pt received in room with spouse and interpreter present and consented to OT tx. Pt seen for ADL training, including bathing (pt req min A to wash buttocks and R arm), dressing with CGA for UBD, mod A for LBD. Pt's spouse demo's proper threading of foley bag through pants for assistance. Pt instructed in hemi technique to put toothpaste on toothbrush, completes oral care standing sink side with CGA.Pt able to manage footwear utilizing hemi techniques with min A. Pt then taken down to therapy gym, NMES applied to L triceps at 30 maCC for 15 mins, 10 seconds on, 10 seconds off. When e-stim activated, pt instructed to weight bear through extended  LUE while seated edge of mat. Therapist assist to facilitate and block L elbow during weightbearing exercises. Pt req cuing for upright posture with mirror placed in front of him. After tx, pt requested to stay up in w/c, left with wife and interpreter in room with all needs met.   Therapy Documentation Precautions:  Precautions Precautions: Fall,Other (comment) Precaution Comments: L hemi, neglect Splint/Cast - Date Prophylactic Dressing Applied (if applicable):  (left hand splint/left boot applied) Restrictions Weight Bearing Restrictions: No   Vital Signs: Therapy Vitals Temp: 99.1 F (37.3 C) Temp Source: Oral Pulse Rate: 100 Resp: 18 BP: 114/79 Patient Position (if appropriate): Sitting Oxygen Therapy SpO2: 97 % O2 Device: Room Air Pain: Pain Assessment Pain Scale: Faces Faces Pain Scale: No hurt   Therapy/Group: Individual  Therapy  Carmine Youngberg 05/15/2020, 3:45 PM

## 2020-05-16 ENCOUNTER — Other Ambulatory Visit (HOSPITAL_COMMUNITY): Payer: Self-pay

## 2020-05-16 LAB — CBC WITH DIFFERENTIAL/PLATELET
Abs Immature Granulocytes: 0.04 10*3/uL (ref 0.00–0.07)
Basophils Absolute: 0.1 10*3/uL (ref 0.0–0.1)
Basophils Relative: 1 %
Eosinophils Absolute: 2 10*3/uL — ABNORMAL HIGH (ref 0.0–0.5)
Eosinophils Relative: 16 %
HCT: 38.3 % — ABNORMAL LOW (ref 39.0–52.0)
Hemoglobin: 12.1 g/dL — ABNORMAL LOW (ref 13.0–17.0)
Immature Granulocytes: 0 %
Lymphocytes Relative: 16 %
Lymphs Abs: 1.9 10*3/uL (ref 0.7–4.0)
MCH: 21.8 pg — ABNORMAL LOW (ref 26.0–34.0)
MCHC: 31.6 g/dL (ref 30.0–36.0)
MCV: 69 fL — ABNORMAL LOW (ref 80.0–100.0)
Monocytes Absolute: 1.2 10*3/uL — ABNORMAL HIGH (ref 0.1–1.0)
Monocytes Relative: 10 %
Neutro Abs: 7.2 10*3/uL (ref 1.7–7.7)
Neutrophils Relative %: 57 %
Platelets: 270 10*3/uL (ref 150–400)
RBC: 5.55 MIL/uL (ref 4.22–5.81)
RDW: 15.2 % (ref 11.5–15.5)
WBC: 12.5 10*3/uL — ABNORMAL HIGH (ref 4.0–10.5)
nRBC: 0 % (ref 0.0–0.2)

## 2020-05-16 MED ORDER — DICLOFENAC SODIUM 1 % EX GEL
2.0000 g | Freq: Four times a day (QID) | CUTANEOUS | 0 refills | Status: DC
  Filled 2020-05-16: qty 200, 30d supply, fill #0

## 2020-05-16 MED ORDER — METOPROLOL TARTRATE 50 MG PO TABS
50.0000 mg | ORAL_TABLET | Freq: Two times a day (BID) | ORAL | 0 refills | Status: DC
  Filled 2020-05-16: qty 60, 30d supply, fill #0

## 2020-05-16 MED ORDER — POLYETHYLENE GLYCOL 3350 17 G PO PACK: 17.0000 g | PACK | Freq: Every day | ORAL | 0 refills | Status: DC

## 2020-05-16 MED ORDER — ACETAMINOPHEN 325 MG PO TABS: 650.0000 mg | ORAL_TABLET | ORAL | Status: AC | PRN

## 2020-05-16 MED ORDER — COLCHICINE 0.6 MG PO TABS
0.6000 mg | ORAL_TABLET | Freq: Every day | ORAL | 0 refills | Status: DC
  Filled 2020-05-16: qty 30, 30d supply, fill #0

## 2020-05-16 MED ORDER — AMLODIPINE BESYLATE 10 MG PO TABS
10.0000 mg | ORAL_TABLET | Freq: Every day | ORAL | 0 refills | Status: DC
  Filled 2020-05-16: qty 30, 30d supply, fill #0

## 2020-05-16 MED ORDER — METHYLPHENIDATE HCL 5 MG PO TABS
5.0000 mg | ORAL_TABLET | Freq: Two times a day (BID) | ORAL | 0 refills | Status: DC
  Filled 2020-05-16: qty 60, 30d supply, fill #0

## 2020-05-16 MED ORDER — SENNOSIDES-DOCUSATE SODIUM 8.6-50 MG PO TABS: 1.0000 | ORAL_TABLET | Freq: Two times a day (BID) | ORAL | Status: DC

## 2020-05-16 MED ORDER — PANTOPRAZOLE SODIUM 40 MG PO TBEC
40.0000 mg | DELAYED_RELEASE_TABLET | Freq: Every day | ORAL | 0 refills | Status: DC
  Filled 2020-05-16: qty 30, 30d supply, fill #0

## 2020-05-16 MED ORDER — ATORVASTATIN CALCIUM 40 MG PO TABS
40.0000 mg | ORAL_TABLET | Freq: Every day | ORAL | 0 refills | Status: DC
  Filled 2020-05-16: qty 30, 30d supply, fill #0

## 2020-05-16 MED ORDER — ADULT MULTIVITAMIN W/MINERALS CH: 1.0000 | ORAL_TABLET | Freq: Every day | ORAL | Status: AC

## 2020-05-16 MED ORDER — METFORMIN HCL 500 MG PO TABS
500.0000 mg | ORAL_TABLET | Freq: Every day | ORAL | 0 refills | Status: DC
  Filled 2020-05-16: qty 30, 30d supply, fill #0

## 2020-05-16 MED ORDER — LIDOCAINE 5 % EX PTCH
1.0000 | MEDICATED_PATCH | CUTANEOUS | 0 refills | Status: DC
  Filled 2020-05-16: qty 30, 30d supply, fill #0

## 2020-05-16 NOTE — Progress Notes (Signed)
Occupational Therapy Session Note  Patient Details  Name: Anthony Huber MRN: [id number] Date of Birth: 08-Jun-1960  Today's Date: 05/16/2020 OT Individual Time: 1302-1400 OT Individual Time Calculation (min): 58 min    Short Term Goals: Week 1:  OT Short Term Goal 1 (Week 1): pt will sit EOB wiht MAX A of 1 for 3 min in prep for ADL OT Short Term Goal 1 - Progress (Week 1): Met OT Short Term Goal 2 (Week 1): Pt will manage LUE with multimodal cuing in prep for UB dressing OT Short Term Goal 2 - Progress (Week 1): Progressing toward goal OT Short Term Goal 3 (Week 1): Pt will intiate during transfer training 75% of trials to demo improved command following OT Short Term Goal 3 - Progress (Week 1): Met OT Short Term Goal 4 (Week 1): Pt will groom with MOD A OT Short Term Goal 4 - Progress (Week 1): Met Week 2:  OT Short Term Goal 1 (Week 2): Pt will be able to sit EOB with mod A in prep for self care. OT Short Term Goal 1 - Progress (Week 2): Met OT Short Term Goal 2 (Week 2): Pt will demonstrate improved L side awareness with bathing UB with min a. OT Short Term Goal 2 - Progress (Week 2): Met OT Short Term Goal 3 (Week 2): Pt will demonstrate improved apraxia to don shirt with mod a. OT Short Term Goal 3 - Progress (Week 2): Met OT Short Term Goal 4 (Week 2): Pt will transfer bed>< wc with mod A of 2. OT Short Term Goal 4 - Progress (Week 2): Met Week 3:  OT Short Term Goal 1 (Week 3): Pt will complete sq pivot transfer from wc >< BSC with mod A of 1. OT Short Term Goal 1 - Progress (Week 3): Met OT Short Term Goal 2 (Week 3): Pt will be able to sit to stand with min A of 1 and hold balance with mod A to prep for clothing management. OT Short Term Goal 2 - Progress (Week 3): Met OT Short Term Goal 3 (Week 3): Pt will be able to don a shirt with min A. OT Short Term Goal 3 - Progress (Week 3): Met Week 4:  OT Short Term Goal 1 (Week 4): Pt will don a shirt with S demonstrating improved L  side awareness. OT Short Term Goal 1 - Progress (Week 4): Progressing toward goal OT Short Term Goal 2 (Week 4): Pt will transfer to [facility] with CGA to min A. OT Short Term Goal 2 - Progress (Week 4): Met OT Short Term Goal 3 (Week 4): From [facility] pt will self cleanse with min A to support his balance. OT Short Term Goal 3 - Progress (Week 4): Progressing toward goal  Skilled Therapeutic Interventions/Progress Updates:     Pt received in room with interpreter and spouse present. Session focused on ADL analysis, static and dynamic sitting balance, and visual scanning. See assist levels for ADLs below. Cued spouse to let pt do as much as he could on his own for maximal independence as she is very quick to jump in and help. Pt instructed in reaching tasks while sitting unsupported, demo'd 1 LOB when reaching far to L side, req min A to recover. Pt instructed in card matching activity to increase attention to L side and promote visual scanning of environment. After tx, pt and spouse and interpreter taken back to room, no questions for therapist and left with all  needs met.   Therapy Documentation Precautions:  Precautions Precautions: Fall Precaution Comments: L hemi, L neglect Splint/Cast - Date Prophylactic Dressing Applied (if applicable):  (left hand splint/left boot applied) Restrictions Weight Bearing Restrictions: No  Vital Signs: Therapy Vitals Temp: 98.7 F (37.1 C) Temp Source: Oral Pulse Rate: 97 Resp: 18 BP: 113/69 Patient Position (if appropriate): Sitting Oxygen Therapy SpO2: 98 % O2 Device: Room Air Pain: Pain Assessment Pain Scale: 0-10 Pain Score: 0-No pain ADL: ADL Eating: Set up Grooming: Setup Where Assessed-Grooming: Sitting at sink Upper Body Bathing: Minimal assistance Where Assessed-Upper Body Bathing: Shower Lower Body Bathing: Minimal assistance Where Assessed-Lower Body Bathing: Shower Upper Body Dressing: Supervision/safety,Minimal cueing Where  Assessed-Upper Body Dressing: Chair Lower Body Dressing: Minimal assistance,Minimal cueing Where Assessed-Lower Body Dressing: Chair Toileting: Moderate assistance Where Assessed-Toileting: Glass blower/designer: Psychiatric nurse Method: Arts development officer: Energy manager: Environmental education officer Method: Education officer, environmental: Transfer tub bench,Grab bars   Therapy/Group: Individual Therapy  Lyndsi Altic 05/16/2020, 2:23 PM

## 2020-05-16 NOTE — Progress Notes (Signed)
Old indwelling foley cath D/C by nurse and reinsertion of new 67fr coude foley catheter inserted per MD order using sterile technique. Pt tolerated well and did not complain of pain after catheter inserted. Balloon  Filled with 10cc NS. Clear,yellow urine noted to drain into drainage bag, no discharge noted.

## 2020-05-16 NOTE — Progress Notes (Signed)
Pt wife called nurse to assess indwelling foley catheter d/t patient having pain in Lower abdomen. Clear yellow urine noted to be draining to drainage bag, balloon drained and catheter repositioned, balloon reinflated. Pt still seemed to have mild discomfort. Nurse explained to patient and wife via interpreter that indwelling foley catheter not to be taken out per MD order and that pt may be having bladder spasms d/t reinsertion of foley. Prn meds given.

## 2020-05-16 NOTE — Progress Notes (Signed)
Occupational Therapy Discharge Summary  Patient Details  Name: Anthony Huber MRN: [id number] Date of Birth: Dec 18, 1960   Patient has met 8 of 12 long term goals due to improved activity tolerance, improved balance, postural control and ability to compensate for deficits.  Patient to discharge at [facility] Assist level.  Patient's care partner is independent to provide the necessary physical and cognitive assistance at discharge. Pt's spouse present during all therapy sessions and demonstrates knowledge on how to assist pt with BADLs. Educated spouse via interpreter on how to reduce risk for shoulder subluxation and to encourage pt to scan environment due to visual field deficits/inattention.   Reasons goals not met: L sided hemi/ weakness, inattention, decreased dynamic balance, proprioception deficits.   Recommendation:  Patient will benefit from ongoing skilled OT services in home health setting to continue to advance functional skills in the area of BADL.  Equipment: No equipment provided  Reasons for discharge: discharge from hospital  Patient/family agrees with progress made and goals achieved: Yes  OT Discharge Precautions/Restrictions  Precautions Precautions: Fall Precaution Comments: L hemi, L neglect Restrictions Weight Bearing Restrictions: No General   Vital Signs Therapy Vitals Temp: 98.7 F (37.1 C) Temp Source: Oral Pulse Rate: 97 Resp: 18 BP: 113/69 Patient Position (if appropriate): Sitting Oxygen Therapy SpO2: 98 % O2 Device: Room Air Pain Pain Assessment Pain Scale: 0-10 Pain Score: 0-No pain ADL ADL Eating: Set up Grooming: Setup Where Assessed-Grooming: Sitting at sink Upper Body Bathing: Minimal assistance Where Assessed-Upper Body Bathing: Shower Lower Body Bathing: Minimal assistance Where Assessed-Lower Body Bathing: Shower Upper Body Dressing: Supervision/safety,Minimal cueing Where Assessed-Upper Body Dressing: Chair Lower Body  Dressing: Minimal assistance,Minimal cueing Where Assessed-Lower Body Dressing: Chair Toileting: Moderate assistance Where Assessed-Toileting: Glass blower/designer: Psychiatric nurse Method: Arts development officer: Energy manager: Environmental education officer Method: Education officer, environmental: Transfer tub bench,Grab bars Vision Baseline Vision/History: Wears glasses Wears Glasses: Reading only Patient Visual Report: Peripheral vision impairment Vision Assessment?: Yes Ocular Range of Motion: Within Functional Limits Visual Fields: Left homonymous hemianopsia (difficult to assess 2/2 language barrier, L visual field deficit vs. L homonymous hemianopsia) Perception  Perception: Impaired Inattention/Neglect: Does not attend to left visual field;Does not attend to left side of body Praxis Praxis: Impaired Praxis Impairment Details: Ideomotor;Motor planning;Initiation Cognition Overall Cognitive Status: Difficult to assess Arousal/Alertness: Awake/alert Orientation Level: Oriented X4 Attention: Sustained Sustained Attention Impairment: Verbal basic;Functional basic Sensation Sensation Light Touch: Impaired by gross assessment Light Touch Impaired Details: Impaired LUE Hot/Cold: Appears Intact Proprioception: Impaired Detail Proprioception Impaired Details: Absent LLE;Impaired LUE Stereognosis: Impaired by gross assessment Coordination Gross Motor Movements are Fluid and Coordinated: No Fine Motor Movements are Fluid and Coordinated: No Coordination and Movement Description: flaccid LUE and LLE; edema RUE impacting [place] Motor  Motor Motor: Hemiplegia Motor - Skilled Clinical Observations: L hemi (LUE > LLE) Mobility  Bed Mobility Bed Mobility: Rolling Right;Rolling Left;Supine to Sit;Sit to Supine Rolling Right: Minimal Assistance - Patient > 75% Rolling Left: Minimal Assistance - Patient >  75% Supine to Sit: Supervision/Verbal cueing Sit to Supine: Minimal Assistance - Patient > 75% Transfers Sit to Stand: Minimal Assistance - Patient > 75% Stand to Sit: Minimal Assistance - Patient > 75%  Trunk/Postural Assessment  Cervical Assessment Cervical Assessment: Within Functional Limits Thoracic Assessment Thoracic Assessment: Exceptions to [facility] (rounded shoulders) Lumbar Assessment Lumbar Assessment: Exceptions to [facility] (sacral sit/ posterior pelvic tilt) Postural Control Postural Control: Deficits on evaluation  Head Control: WFL Trunk Control: Limitations on L flank Righting Reactions: Delayed but significantly improved since date of evaluation Protective Responses: Delayed but significantly improved since date of evaluation  Balance Balance Balance Assessed: Yes Static Sitting Balance Static Sitting - Balance Support: Feet supported;No upper extremity supported Static Sitting - Level of Assistance: 5: Stand by assistance Dynamic Sitting Balance Dynamic Sitting - Balance Support: Feet supported;During functional activity Dynamic Sitting - Level of Assistance: 4: Min assist Sitting balance - Comments: Left/R/ posterior/anterior lean,, maxA-total A for sitting balance Static Standing Balance Static Standing - Balance Support: Right upper extremity supported Static Standing - Level of Assistance: 4: Min assist Dynamic Standing Balance Dynamic Standing - Balance Support: During functional activity Dynamic Standing - Level of Assistance: 3: Mod assist Extremity/Trunk Assessment RUE Assessment RUE Assessment: Within Functional Limits LUE Assessment LUE Assessment: Exceptions to [facility] Passive Range of Motion (PROM) Comments: PROM WFL Active Range of Motion (AROM) Comments: 2- in biceps, shoulder flexion, can form gross grasp with hand but cannot hold against pressure General Strength Comments: flaccid UE LUE Body System: Neuro Brunstrum levels for arm and hand:  Arm;Hand   [employer] 05/16/2020, 2:22 PM

## 2020-05-16 NOTE — Progress Notes (Signed)
PROGRESS NOTE   Subjective/Complaints: No complaints this morning WBC trending downward Hgb stable L knee instability  ROS: limited by language.   Objective:   No results found. Recent Labs    05/15/20 0426 05/16/20 0540  WBC 18.0* 12.5*  HGB 12.2* 12.1*  HCT 38.5* 38.3*  PLT 221 270   No results for input(s): NA, K, CL, CO2, GLUCOSE, BUN, CREATININE, CALCIUM in the last 72 hours.  Intake/Output Summary (Last 24 hours) at 05/16/2020 1319 Last data filed at 05/16/2020 0700 Gross per 24 hour  Intake 600 ml  Output 200 ml  Net 400 ml        Physical Exam: Vital Signs Blood pressure 122/65, pulse 63, temperature 98.2 F (36.8 C), temperature source Oral, resp. rate 20, height 4\' 11"  (1.499 m), weight 44.5 kg, SpO2 96 %.  Gen: no distress, normal appearing HEENT: oral mucosa pink and moist, NCAT Cardio: Reg rate Chest: normal effort, normal rate of breathing Abd: soft, non-distended Ext: no edema Psych: Normal mood.  Normal behavior. Musc: No edema in extremities.  No tenderness in extremities. Neurological: Alert Left facial weakness, unchanged Left gaze preference, ?improving LUE: 1+-2-/5 proximal to distal with significant apraxia, improving LLE: HF 2+/5, KE 3/5, ADF 1+/5 with apraxia, stable No increase in tone noted   Assessment/Plan: 1. Functional deficits which require 3+ hours per day of interdisciplinary therapy in a comprehensive inpatient rehab setting.  Physiatrist is providing close team supervision and 24 hour management of active medical problems listed below.  Physiatrist and rehab team continue to assess barriers to discharge/monitor patient progress toward functional and medical goals  Care Tool:  Bathing    Body parts bathed by patient: Face,Chest,Abdomen,Left arm,Front perineal area,Right upper leg,Left upper leg,Right lower leg,Left lower leg   Body parts bathed by helper: Right  arm,Buttocks     Bathing assist Assist Level: Minimal Assistance - Patient > 75%     Upper Body Dressing/Undressing Upper body dressing   What is the patient wearing?: Pull over shirt    Upper body assist Assist Level: Contact Guard/Touching assist    Lower Body Dressing/Undressing Lower body dressing      What is the patient wearing?: Pants     Lower body assist Assist for lower body dressing: Moderate Assistance - Patient 50 - 74%     Toileting Toileting    Toileting assist Assist for toileting: Maximal Assistance - Patient 25 - 49%     Transfers Chair/bed transfer  Transfers assist     Chair/bed transfer assist level: Minimal Assistance - Patient > 75%     Locomotion Ambulation   Ambulation assist   Ambulation activity did not occur: Safety/medical concerns  Assist level: 2 helpers (+2 mod A) Assistive device:  (3 muskateer) Max distance: 28ft   Walk 10 feet activity   Assist  Walk 10 feet activity did not occur: Safety/medical concerns  Assist level: 2 helpers Assistive device: Other (comment) (3 muskateer)   Walk 50 feet activity   Assist Walk 50 feet with 2 turns activity did not occur: Safety/medical concerns  Assist level: 2 helpers Assistive device: Other (comment) (3 muskateer)    Walk 150 feet  activity   Assist Walk 150 feet activity did not occur: Safety/medical concerns         Walk 10 feet on uneven surface  activity   Assist Walk 10 feet on uneven surfaces activity did not occur: Safety/medical concerns         Wheelchair     Assist Will patient use wheelchair at discharge?: Yes Type of Wheelchair: Manual Wheelchair activity did not occur: Safety/medical concerns  Wheelchair assist level: Supervision/Verbal cueing Max wheelchair distance: 239ft    Wheelchair 50 feet with 2 turns activity    Assist    Wheelchair 50 feet with 2 turns activity did not occur: Safety/medical concerns   Assist  Level: Supervision/Verbal cueing   Wheelchair 150 feet activity     Assist  Wheelchair 150 feet activity did not occur: Safety/medical concerns   Assist Level: Supervision/Verbal cueing   Medical Problem List and Plan: 1.Left hemiparesissecondary to right basal ganglia intraparenchymal hemorrhage secondary to hypertensive crisis            Continue CIR             WHO/PRAFO nightly 2. Impaired mobility -DVT/anticoagulation:Continue Subcutaneous heparin initiated for 02/25/2020 -antiplatelet therapy: N/A 3. Lower back pain:continue Tylenol as needed. D/c tramadol. Added kpad and lidocaine patch overnight  Controlled on 5/12 4. Mood:Provide emotional support -antipsychotic agents: N/A 5. Neuropsych: This patientis notcapable of making decisions on hisown behalf. 6. Skin/Wound Care:Routine skin checks 7. Fluids/Electrolytes/Nutrition:Routine in and outs. 8. Hypertension.              Continue Norvasc 10 mg daily             Lopressor 50 mg twice daily.              D/c'ed clonidine.              Controlled on 5/12 9. History of open Uretlithotomy2005 in [place] as well as left PCNL and laser ureterotomyOctober 2018 followed by Dr. Marlou Porch. 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   CONTINUE FOLEY TUBE.DO NOT REMOVE .Foley catheter to remain in place until follow-up in the office of Dr. Modena Slater for cystogram.  Will follow up with Urology regarding replacement give symptoms, awaiting eval 10. Diet controlled diabetes mellitus. Hemoglobin A1c 5.8. SSI  Educate pt/family as needed  Metformin decreased back to 500mg  daily and restarted CBG checks. Continue metformin  Relatively controlled on 5/12 11.  Leukocytosis  WBCs 18.0 on 5/11, trending down 5/12             Afebrile             Recheck 4/19 12.  Acute blood loss anemia             Hgb stable  5/12             Recheck 4/19 13.  CKD              Creatinine 1.14 on 5/9             Encourage fluids, discussed again 14.  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  Slow transit constipation             Bowel meds increased on 4/8             Improved 16. Lethargy:   Continue Ritalin 5mg  BID, will plan on weaning this week  Improving 17. Emesis              Appears to have resolved              Changed Lipitor to HS to spread out medications 18. Likely UTI  Will order UA/Ucx after cath change  Will likely requre abx  See #9,11    LOS: 35 days A FACE TO FACE EVALUATION WAS PERFORMED  Niya Behler P Zephaniah Lubrano 05/16/2020, 1:19 PM

## 2020-05-16 NOTE — Discharge Summary (Signed)
Physical Therapy Discharge Summary  Patient Details  Name: Sabastien Huber MRN: [id number] Date of Birth: 1960/11/14  Today's Date: 05/16/2020 PT Individual Time: 6283-1517 PT Individual Time Calculation (min): 70 min   Patient has met 6 of 8 long term goals due to improved activity tolerance, improved balance, improved postural control, increased strength, ability to compensate for deficits, functional use of  left upper extremity and left lower extremity, improved attention and improved awareness.  Patient to discharge at a wheelchair level [place].   Patient's care partner is independent to provide the necessary physical and cognitive assistance at discharge.  Reasons goals not met: Pt requires minA for sit<>stand transfers due to L knee instability. He also required +2 assist for all functional gait training.  Recommendation:  Patient will benefit from ongoing skilled PT services in home health setting to continue to advance safe functional mobility, address ongoing impairments in L hemibody neuro re education, dynamic sitting balance, static and dynamic standing balance, w/c propulsion, home safety, caregiver training in order to minimize fall risk.  Equipment: K5 ultra lighweight and super hemi w/c - [employer]ree[employer]. Ordered through [employer]  Reasons for discharge: treatment goals met and discharge from hospital  Patient/family agrees with progress made and goals achieved: Yes  PT Discharge Precautions/Restrictions Precautions Precautions: Fall Precaution Comments: L hemi, L neglect Restrictions Weight Bearing Restrictions: No Vision/Perception  Vision - Assessment Eye Alignment: Within Functional Limits Ocular Range of Motion: Within Functional Limits Perception Perception: Impaired Inattention/Neglect: Does not attend to left visual field;Does not attend to left side of body Praxis Praxis: Impaired Praxis Impairment Details: Ideomotor;Motor planning;Initiation   Cognition Overall Cognitive Status: Difficult to assess Arousal/Alertness: Awake/alert Orientation Level: Oriented X4 Attention: Sustained Sustained Attention: Appears intact Sustained Attention Impairment: Verbal basic;Functional basic Problem Solving: Impaired Problem Solving Impairment: Functional complex;Verbal complex Initiating: Impaired Initiating Impairment: Functional basic Safety/Judgment: Impaired Sensation Sensation Light Touch: Impaired by gross assessment Hot/Cold: Impaired by gross assessment Proprioception: Impaired Detail Proprioception Impaired Details: Absent LLE (Absent L great toe) Stereognosis: Impaired by gross assessment Coordination Gross Motor Movements are Fluid and Coordinated: No Fine Motor Movements are Fluid and Coordinated: No Motor  Motor Motor: Hemiplegia Motor - Skilled Clinical Observations: L hemi (LUE > LLE)  Mobility Bed Mobility Bed Mobility: Rolling Right;Rolling Left;Supine to Sit;Sit to Supine Rolling Right: Minimal Assistance - Patient > 75% Rolling Left: Minimal Assistance - Patient > 75% Supine to Sit: Supervision/Verbal cueing Sit to Supine: Minimal Assistance - Patient > 75% Transfers Transfers: Sit to Stand;Stand to Sit;Stand Pivot Transfers Sit to Stand: Minimal Assistance - Patient > 75% Stand to Sit: Minimal Assistance - Patient > 75% Stand Pivot Transfers: Minimal Assistance - Patient > 75% Stand Pivot Transfer Details: Verbal cues for sequencing;Tactile cues for placement;Tactile cues for initiation;Tactile cues for sequencing;Tactile cues for weight shifting;Verbal cues for precautions/safety Stand Pivot Transfer Details (indicate cue type and reason): L knee block Squat Pivot Transfers: Minimal Assistance - Patient > 75% Transfer (Assistive device): None Locomotion  Gait Ambulation: Yes Gait Assistance: 2 Helpers (+3 assist for w/c follow) Gait Distance (Feet): 60 Feet Assistive device: 2 person hand held  assist Gait Assistance Details: Tactile cues for initiation;Tactile cues for placement;Tactile cues for weight beaing;Tactile cues for sequencing;Tactile cues for weight shifting;Tactile cues for posture;Visual cues/gestures for precautions/safety;Verbal cues for precautions/safety;Verbal cues for technique;Verbal cues for sequencing;Visual cues/gestures for sequencing;Verbal cues for gait pattern;Manual facilitation for weight shifting;Manual facilitation for placement;Manual facilitation for weight bearing Gait Gait: Yes Gait Pattern: Impaired Gait Pattern:  Step-to pattern;Decreased step length - right;Decreased step length - left;Decreased stance time - left;Decreased dorsiflexion - left;Narrow base of support;Trunk flexed;Poor foot clearance - left;Decreased weight shift to left Stairs / Additional Locomotion Stairs: No Wheelchair Mobility Wheelchair Mobility: Yes Wheelchair Assistance: Chartered loss adjuster: Right upper extremity;Right lower extremity Wheelchair Parts Management: Needs assistance Distance: 259f  Trunk/Postural Assessment  Cervical Assessment Cervical Assessment: Within Functional Limits Thoracic Assessment Thoracic Assessment: Exceptions to WFL (rounded shlders) Lumbar Assessment Lumbar Assessment: Exceptions to [employer](posterior pelvic tilt) Postural Control Postural Control: Deficits on evaluation Head Control: WFL Trunk Control: Limitations on L flank Righting Reactions: Delayed but significantly improved since date of evaluation Protective Responses: Delayed but significantly improved since date of evaluation  Balance Balance Balance Assessed: Yes Static Sitting Balance Static Sitting - Balance Support: Feet supported;No upper extremity supported Static Sitting - Level of Assistance: 5: Stand by assistance Dynamic Sitting Balance Dynamic Sitting - Balance Support: Feet supported;During functional activity Dynamic Sitting - Level of  Assistance: 4: Min assist Static Standing Balance Static Standing - Balance Support: Right upper extremity supported Static Standing - Level of Assistance: 4: Min assist Dynamic Standing Balance Dynamic Standing - Balance Support: During functional activity Dynamic Standing - Level of Assistance: 3: Mod assist Extremity Assessment      RLE Assessment RLE Assessment: Within Functional Limits LLE Assessment LLE Assessment: Exceptions to [facility] Strength Comments: Hip flex 2+/5, knee ext 4-/5, ankle DF & PF 0/5  Skilled Intervention: Pt greeted supine in bed with wife at bedside, interpreter arriving later during session. Supine<>sit completed wtihout bed rails and HOB flat with supervision (!) with cues needed for L hemibody management. Sit's EOB with SBA unsupported. Donned tennis shoes for time management with totalA while he sat EOB. Squat<>pivot transfer with minA and L knee block to w/c. Focused first 1/2 of session on w/c mobility and management. Pt propelled from his room to 4N tower bridge, >40646f with minA from wife, using hemi technique with R hemibody. Cones set up, spaced ~3.46f4fpart bilaterally - focused on straight path w/c propulsion as pt frequently veers L towards paretic side. With visual cues from cones and verbal cues from wife, pt able to maintain straight path with supervision propulsion for >200f59f level ground. He practiced this multiple times with improved carryover noted for straight path. Then practiced weaving in/out of cones that were spaced ~3ft 17frt - pt able to progress from minA to supervision for the cone weaving and cues needed for L visual scanning. Placed cones on L side of hallway and instructed patient to locate and retrieve from w/c level. Wife providing verbal cues for scanning and pt was able to complete. Reviewed home safety training with the wife, DC planning, role of f/u therapies, stroke recovery, etc. Also reinforced education that SLP wasn't  able to review due to not having an interpreter such as having son assist with medications. All parties feel confident with tomorrow's discharge. Pt propelled himself with supervision, ~200ft,3fk to his room and he requested to remain seated in w/c at end of session. Pt and wife both thankful for PT/OT/SLP interventions during their 35day stay in [facility].   Shaquira Moroz P Teryn Boerema PT, DPT 05/16/2020, 10:55 AM

## 2020-05-16 NOTE — Progress Notes (Signed)
Speech Language Pathology Discharge Summary  Patient Details  Name: Anthony Huber MRN: [id number] Date of Birth: 02-Sep-1960  Today's Date: 05/16/2020   Patient has met 5 of 5 long term goals.  Patient to discharge at overall Supervision;Min level.  Reasons goals not met: N/A   Clinical Impression/Discharge Summary: Patient made initially very slow but steady progress with cognitive-linguistic goals and short term dysphagia management, meeting all 5 LTG's. He is being discharged home with family with wife educated for cognitive and physical assistance and patient's son to be managing medications, etc. Overall, he is at a supervision to min A level for problem solving, reasoning and expressing wants/needs. He does seem to rely too heavily on wife's assistance and therefore does not attempt to problem solve without cues to do so. He has become more verbal and conversational and although he has no impairments in expressive language or receptive language, he does have impairments in attention and awareness, which impacts his ability to self monitor, initiate and maintain topics. SLP is recommending continue with skilled ST upon discharge with [place], however this will only be effective if [facility] therapist has access to live interpreter Starlyn Skeans Mike Gip).  Care Partner:  Caregiver Able to Provide Assistance: Yes  Type of Caregiver Assistance: Physical;Cognitive  Recommendation:  24 hour supervision/assistance;Home Health SLP;Other (comment) ([place] recommended if live intepreter can be utilized Architect). If interpreter cannot be guaranteed for [facility] ST sessions then SLP services would not be beneficial.)  Rationale for SLP Follow Up: Maximize cognitive function and independence   Equipment: None for ST   Reasons for discharge: Discharged from hospital   Patient/Family Agrees with Progress Made and Goals Achieved: Yes    Sonia Baller, MA, CCC-SLP Speech Therapy

## 2020-05-16 NOTE — Progress Notes (Signed)
Discussed with urology services concerning Foley catheter tube current plan is to exchange Foley tube reinsert and patient would follow-up outpatient with Dr. Marlou Porch urology services Monday, 05/20/2020 at 11 AM

## 2020-05-17 LAB — URINALYSIS, ROUTINE W REFLEX MICROSCOPIC
Bilirubin Urine: NEGATIVE
Glucose, UA: NEGATIVE mg/dL
Hgb urine dipstick: NEGATIVE
Ketones, ur: NEGATIVE mg/dL
Nitrite: NEGATIVE
Protein, ur: NEGATIVE mg/dL
Specific Gravity, Urine: 1.011 (ref 1.005–1.030)
WBC, UA: >50 WBC/hpf — ABNORMAL HIGH (ref 0–5)
pH: 7 (ref 5.0–8.0)

## 2020-05-17 NOTE — Progress Notes (Signed)
Nurse spoke w/ PA Dan about foley catheter being out. PA stated that indwelling foley catheter needed to be reinserted per MD orders d/t fistula and follow up with urology in upcoming week. Nurse explained this to patient and wife via interpreter and patient stated that "it hurts too bad" and refused nurse to insert foley at this time.Dan notified of patient refusal. UA/C&S also ordered and to be collected.

## 2020-05-17 NOTE — Progress Notes (Signed)
Patient still having pain of 10/10 pointing to his bladder despite catheter is draining clear yellow urine of about 100cc last night around 2000H. Tried to advance FC with no resolution. Removed FC with on-call Dr's permission, to be reinserted afterwards.   Patient passed urine with minimal PVR. He also verbalized relief of pain, now smiling. He and his wife refused to have the catheter reinserted despite explanation given.   This morning patient continued to pass urine 400cc, and 200cc via urinal and continous to be pain free. PVR is 0. No indication for reinserting FC for now.

## 2020-05-17 NOTE — Progress Notes (Signed)
PROGRESS NOTE   Subjective/Complaints: Patient seen laying in bed this AM.  Wife at bedside.  Patient indicates he slept well overnight.  Overnight, noted to have penile pain and foley d/ced.  Patient denies penile pain, dizziness, knee pain this AM.   ROS: limited by language.   Objective:   No results found. Recent Labs    05/15/20 0426 05/16/20 0540  WBC 18.0* 12.5*  HGB 12.2* 12.1*  HCT 38.5* 38.3*  PLT 221 270   No results for input(s): NA, K, CL, CO2, GLUCOSE, BUN, CREATININE, CALCIUM in the last 72 hours.  Intake/Output Summary (Last 24 hours) at 05/17/2020 1054 Last data filed at 05/17/2020 0900 Gross per 24 hour  Intake 440 ml  Output 1470 ml  Net -1030 ml        Physical Exam: Vital Signs Blood pressure 121/89, pulse 96, temperature 98.2 F (36.8 C), resp. rate 16, height 4\' 11"  (1.499 m), weight 44.5 kg, SpO2 98 %.  Constitutional: No distress . Vital signs reviewed. HENT: Normocephalic.  Atraumatic. Eyes: EOMI. No discharge. Cardiovascular: No JVD.  RRR. Respiratory: Normal effort.  No stridor.  Bilateral clear to auscultation. GI: Non-distended.  BS +. Skin: Warm and dry.  Intact. Psych: Normal mood.  Normal behavior. Musc: No edema in extremities.  No tenderness in extremities. Neurological: Alert Left facial weakness, unchanged Left gaze preference, ?improving LUE: 1+-2-/5 proximal to distal with significant apraxia, improving LLE: HF 2+/5, KE 3/5, ADF 1+/5 with apraxia, improving No increase in tone noted   Assessment/Plan: 1. Functional deficits which require 3+ hours per day of interdisciplinary therapy in a comprehensive inpatient rehab setting.  Physiatrist is providing close team supervision and 24 hour management of active medical problems listed below.  Physiatrist and rehab team continue to assess barriers to discharge/monitor patient progress toward functional and medical  goals  Care Tool:  Bathing    Body parts bathed by patient: Face,Chest,Abdomen,Left arm,Front perineal area,Right upper leg,Left upper leg,Right lower leg,Left lower leg   Body parts bathed by helper: Right arm,Buttocks     Bathing assist Assist Level: Minimal Assistance - Patient > 75%     Upper Body Dressing/Undressing Upper body dressing   What is the patient wearing?: Pull over shirt    Upper body assist Assist Level: Minimal Assistance - Patient > 75%    Lower Body Dressing/Undressing Lower body dressing      What is the patient wearing?: Pants     Lower body assist Assist for lower body dressing: Minimal Assistance - Patient > 75%     Toileting Toileting    Toileting assist Assist for toileting: Moderate Assistance - Patient 50 - 74%     Transfers Chair/bed transfer  Transfers assist     Chair/bed transfer assist level: Minimal Assistance - Patient > 75%     Locomotion Ambulation   Ambulation assist   Ambulation activity did not occur: Safety/medical concerns  Assist level: 2 helpers (+2 mod A) Assistive device:  (3 muskateer) Max distance: 48ft   Walk 10 feet activity   Assist  Walk 10 feet activity did not occur: Safety/medical concerns  Assist level: 2 helpers Assistive device: Other (comment) (  3 muskateer)   Walk 50 feet activity   Assist Walk 50 feet with 2 turns activity did not occur: Safety/medical concerns  Assist level: 2 helpers Assistive device: Other (comment) (3 muskateer)    Walk 150 feet activity   Assist Walk 150 feet activity did not occur: Safety/medical concerns         Walk 10 feet on uneven surface  activity   Assist Walk 10 feet on uneven surfaces activity did not occur: Safety/medical concerns         Wheelchair     Assist Will patient use wheelchair at discharge?: Yes Type of Wheelchair: Manual Wheelchair activity did not occur: Safety/medical concerns  Wheelchair assist level:  Supervision/Verbal cueing Max wheelchair distance: 243ft    Wheelchair 50 feet with 2 turns activity    Assist    Wheelchair 50 feet with 2 turns activity did not occur: Safety/medical concerns   Assist Level: Supervision/Verbal cueing   Wheelchair 150 feet activity     Assist  Wheelchair 150 feet activity did not occur: Safety/medical concerns   Assist Level: Supervision/Verbal cueing   Medical Problem List and Plan: 1.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 Impaired mobility -DVT/anticoagulation:Continue Subcutaneous heparin initiated for 02/25/2020 -antiplatelet therapy: N/A 3. Lower back pain:continue Tylenol as needed. D/c tramadol. Added kpad and lidocaine patch overnight  Controlled on 5/13 4. Mood:Provide emotional support -antipsychotic agents: N/A 5. Neuropsych: This patientis notcapable of making decisions on hisown behalf. 6. Skin/Wound Care:Routine skin checks 7. Fluids/Electrolytes/Nutrition:Routine in and outs. 8. Hypertension.              Continue Norvasc 10 mg daily             Lopressor 50 mg twice daily.              D/c'ed clonidine.              Controlled on 5/13 9. History of open Uretlithotomy2005 in [place] as well as left PCNL and laser ureterotomyOctober 2018 followed by Dr. Marlou Porch. 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   CONTINUE FOLEY TUBE.DO NOT REMOVE .Foley catheter to remain in place until follow-up in the office of Dr. Modena Slater for cystogram.  Per Urology keep foley in place until follow up on Monday, however, pt refusing.  Will continue to attempt to educate regarding etiology and urology recs - limited due to language  even with interpretor.  10. Diet controlled diabetes mellitus. Hemoglobin A1c 5.8. SSI  Educate pt/family as needed  Metformin decreased back to 500mg  daily and restarted CBG checks. Continue metformin  Relatively controlled on 5/13 11.  Leukocytosis  WBCs 12.5 on 5/12             Afebrile             Recheck 4/19 12.  Acute blood loss anemia             Hgb 12.1 on 5/12              Recheck 4/19 13.  CKD              Creatinine 1.14 on 5/9             Encourage fluids, discussed again 14.  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  Slow transit constipation             Bowel meds increased on 4/8             Improved 16. Lethargy:   Continue Ritalin 5mg  BID, will plan on weaning this week  Improving 17. Emesis              Appears to have resolved              Changed Lipitor to HS to spread out medications 18. ?UTI  Will order UA/Ucx after cath change, discussed with nursing  ?abx  See #9,11   > 30 minutes spent in total in discharge planning between myself and PA regarding aforementioned, as well discussion regarding DME equipment, follow-up appointments, follow-up therapies, discharge medications, discharge recommendations, answering questions.  Please see discharge summary as well.  LOS: 36 days A FACE TO FACE EVALUATION WAS PERFORMED  Crissa Sowder 05/17/2020, 10:54 AM

## 2020-05-17 NOTE — Progress Notes (Signed)
Orders were given by urology services to change out Foley tube for discharge which did take place however patient refused to have Foley tube reinserted it was stressed to patient wife through the interpreter the need to have Foley tube replaced at the recommendations of urology services which they continued to adamantly refuse.  It was discussed that appointments have been made with urology services for 05/20/2020 at 11 AM and that appointment would need to be kept to discuss plan of care.

## 2020-05-18 ENCOUNTER — Other Ambulatory Visit: Payer: Self-pay | Admitting: Physical Medicine & Rehabilitation

## 2020-05-18 MED ORDER — NITROFURANTOIN MONOHYD MACRO 100 MG PO CAPS: 100.0000 mg | ORAL_CAPSULE | Freq: Two times a day (BID) | ORAL | 0 refills | Status: DC

## 2020-05-18 NOTE — Progress Notes (Signed)
Called patient's son. Discussed UTI and abx sent pharmacy.

## 2020-05-20 ENCOUNTER — Telehealth: Payer: Self-pay

## 2020-05-20 LAB — URINE CULTURE
Culture: >=100000 — AB
Special Requests: NORMAL

## 2020-05-20 NOTE — Telephone Encounter (Signed)
Germain Osgood with [facility]:   Mr. Nosson Wender home health visit will start today 05/20/2020. The delay is because his Son wanted to present for the visit.

## 2020-05-20 NOTE — Telephone Encounter (Signed)
Thank you for the information.

## 2020-05-21 ENCOUNTER — Telehealth: Payer: Self-pay

## 2020-05-21 NOTE — Telephone Encounter (Signed)
Transition Care Management Unsuccessful Follow-up Telephone Call  Date of discharge and from where:  05/17/20 [facility]  Attempts:  1st Attempt  Reason for unsuccessful TCM follow-up call:  Unable to leave message  Called pt son Bernadette Hoit and VM not set up. According to Dr. Allena Katz patient does not speak English and son understands some.

## 2020-05-21 NOTE — Telephone Encounter (Signed)
Janyth Contes, PT from [facility] called requesting verbal orders for HHPT 2wk3. Orders approved and given. I informed Janyth Contes that patient does not speak [place] and son is limited [place]. I also asked her to have the son call the office if she gets in touch with him.

## 2020-05-21 NOTE — Telephone Encounter (Signed)
Home health called to obtain Verbal orders to treat patient once a week  x 4weeks .

## 2020-05-31 ENCOUNTER — Encounter: Payer: BC Managed Care – PPO | Attending: Registered Nurse | Admitting: Registered Nurse

## 2020-05-31 NOTE — Progress Notes (Deleted)
   Subjective:    Patient ID: Anthony Huber, male    DOB: [birth year], 60 y.o.   MRN: [id number]  HPI Pain Inventory Average Pain {NUMBERS; 0-10:5044} Pain Right Now {NUMBERS; 0-10:5044} My pain is {PAIN DESCRIPTION:21022940}  LOCATION OF PAIN  ***  BOWEL Number of stools per week: *** Oral laxative use {YES/NO:21197} Type of laxative *** Enema or suppository use {YES/NO:21197} History of colostomy {YES/NO:21197} Incontinent {YES/NO:21197}  BLADDER {bladder options:24190} In and out cath, frequency *** Able to self cath {YES/NO:21197} Bladder incontinence {YES/NO:21197} Frequent urination {YES/NO:21197} Leakage with coughing {YES/NO:21197} Difficulty starting stream {YES/NO:21197} Incomplete bladder emptying {YES/NO:21197}   Mobility {MOBILITY GMW:10272536}  Function {FUNCTION:21022946}  Neuro/Psych {NEURO/PSYCH:21022948}  Prior Studies {CPRM PRIOR STUDIES:21022953}  Physicians involved in your care {CPRM PHYSICIANS INVOLVED IN YOUR CARE:21022954}   No family history on file. Social History   Socioeconomic History  . Marital status: Married    Spouse name: Not on file  . Number of children: Not on file  . Years of education: Not on file  . Highest education level: Not on file  Occupational History  . Not on file  Tobacco Use  . Smoking status: Former Smoker    Quit date: 2016    Years since quitting: 6.4  . Smokeless tobacco: Never Used  Vaping Use  . Vaping Use: Never used  Substance and Sexual Activity  . Alcohol use: No  . Drug use: No  . Sexual activity: Not on file  Other Topics Concern  . Not on file  Social History Narrative  . Not on file   Social Determinants of Health   Financial Resource Strain: Not on file  Food Insecurity: Not on file  Transportation Needs: Not on file  Physical Activity: Not on file  Stress: Not on file  Social Connections: Not on file   Past Surgical History:  Procedure Laterality Date  . BACK SURGERY    .  BLADDER SURGERY    . CORONARY ANGIOPLASTY WITH STENT PLACEMENT    . CYSTOSCOPY WITH STENT PLACEMENT Left 09/05/2016   Procedure: CYSTOSCOPY, URETEROSCOPY;  Surgeon: Crist Fat, MD;  Location: [facility];  Service: Urology;  Laterality: Left;  . IR NEPHROSTOMY PLACEMENT LEFT  09/06/2016  . NEPHROLITHOTOMY Left 09/16/2016   Procedure: NEPHROLITHOTOMY PERCUTANEOUS  LEFT;  Surgeon: Crist Fat, MD;  Location: [facility];  Service: Urology;  Laterality: Left;   Past Medical History:  Diagnosis Date  . Coronary artery disease   . Diabetes mellitus without complication (HCC)   . Hypertension    There were no vitals taken for this visit.  Opioid Risk Score:   Fall Risk Score:  `1  Depression screen PHQ 2/9  No flowsheet data found.   Review of Systems     Objective:   Physical Exam        Assessment & Plan:

## 2020-06-26 ENCOUNTER — Encounter: Payer: Self-pay | Admitting: Physician Assistant

## 2020-06-26 ENCOUNTER — Other Ambulatory Visit: Payer: Self-pay

## 2020-06-26 ENCOUNTER — Ambulatory Visit: Payer: BC Managed Care – PPO | Attending: Physician Assistant | Admitting: Physician Assistant

## 2020-06-26 VITALS — BP 144/95 | HR 98 | Resp 16

## 2020-06-26 DIAGNOSIS — D62 Acute posthemorrhagic anemia: Secondary | ICD-10-CM | POA: Diagnosis not present

## 2020-06-26 DIAGNOSIS — I610 Nontraumatic intracerebral hemorrhage in hemisphere, subcortical: Secondary | ICD-10-CM

## 2020-06-26 DIAGNOSIS — Z09 Encounter for follow-up examination after completed treatment for conditions other than malignant neoplasm: Secondary | ICD-10-CM

## 2020-06-26 DIAGNOSIS — N179 Acute kidney failure, unspecified: Secondary | ICD-10-CM | POA: Diagnosis not present

## 2020-06-26 DIAGNOSIS — D72829 Elevated white blood cell count, unspecified: Secondary | ICD-10-CM | POA: Diagnosis not present

## 2020-06-26 DIAGNOSIS — E1165 Type 2 diabetes mellitus with hyperglycemia: Secondary | ICD-10-CM

## 2020-06-26 DIAGNOSIS — Z789 Other specified health status: Secondary | ICD-10-CM

## 2020-06-26 DIAGNOSIS — I10 Essential (primary) hypertension: Secondary | ICD-10-CM

## 2020-06-26 LAB — GLUCOSE, POCT (MANUAL RESULT ENTRY): POC Glucose: 106 mg/dl — AB (ref 70–99)

## 2020-06-26 MED ORDER — ATORVASTATIN CALCIUM 40 MG PO TABS
40.0000 mg | ORAL_TABLET | Freq: Every day | ORAL | 1 refills | Status: DC
  Filled 2020-06-26: qty 30, 30d supply, fill #0

## 2020-06-26 MED ORDER — DICLOFENAC SODIUM 1 % EX GEL
2.0000 g | Freq: Four times a day (QID) | CUTANEOUS | 3 refills | Status: DC
  Filled 2020-06-26: qty 200, 25d supply, fill #0

## 2020-06-26 MED ORDER — LIDOCAINE 5 % EX PTCH
1.0000 | MEDICATED_PATCH | CUTANEOUS | 2 refills | Status: DC
  Filled 2020-06-26: qty 30, 30d supply, fill #0

## 2020-06-26 MED ORDER — METOPROLOL TARTRATE 50 MG PO TABS
50.0000 mg | ORAL_TABLET | Freq: Two times a day (BID) | ORAL | 1 refills | Status: DC
  Filled 2020-06-26: qty 60, 30d supply, fill #0

## 2020-06-26 MED ORDER — METFORMIN HCL 500 MG PO TABS
500.0000 mg | ORAL_TABLET | Freq: Every day | ORAL | 1 refills | Status: DC
  Filled 2020-06-26: qty 30, 30d supply, fill #0

## 2020-06-26 MED ORDER — COLCHICINE 0.6 MG PO TABS
0.6000 mg | ORAL_TABLET | Freq: Every day | ORAL | 4 refills | Status: DC
  Filled 2020-06-26: qty 30, 30d supply, fill #0
  Filled 2020-08-15: qty 30, 30d supply, fill #1
  Filled 2020-10-03: qty 30, 30d supply, fill #2

## 2020-06-26 MED ORDER — AMLODIPINE BESYLATE 10 MG PO TABS
10.0000 mg | ORAL_TABLET | Freq: Every day | ORAL | 1 refills | Status: DC
  Filled 2020-06-26: qty 30, 30d supply, fill #0

## 2020-06-26 NOTE — Progress Notes (Signed)
Patient ID: Anthony Cassishai Mcgregory, male   DOB: [birth year], 60 y.o.   MRN: [id number]    Anthony Cassishai Diviney, is a 60 y.o. male  [id number]  [id number]  DOB - [birth year]  Subjective:  Chief Complaint and HPI: Anthony Huber is a 60 y.o. male here today to establish care and for a follow up visit after brain hemorrhage/hospitalization and subsequent inpatient rehab. After hospitalization 4/2-04/11/2020 after intracranial hemorrhage and subsequent inpatient rehab 4/7-5/13/2022.  He has seen his urologist since discharge and has another follow up scheduled.  A referral was placed for neurologist but I do not see an actual appt scheduled.  Combination of language resources and the son of the patient provided translation.  His wife is with him.  His appetite seems to be steady.  He is using a wheelchair.  He continues to need help with most ADL which his wife helps him with.  Needs meds RF.  Did not take amlodipine today.     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   Hypertensive emergency: Required Cleviprex infusion on admission-BP now stable.   HTN: BP stable-continue amlodipine, metoprolol and clonidine.  SBP goal less than 160.   HLD:  Start statin on discharge.   Vesicocutaneous fistula: Urology following with recommendations to keep Foley catheter in place for at least 2 to 4 weeks   Left hydronephrosis: Discussed with urology-Dr. Andree CossHerrick-on 4/6-chronic issue-does not require any further intervention.   Leukocytosis: Probably due to stress margination-downtrending-no indication of any infection.    Follow periodically.   Mild AKI: Volume status stable-stable for continued close follow-up.   History of CAD: No anginal symptoms-unable to start antiplatelets due to ICH.    from inpatient rehab discharge 4/7-5/13/2022:  Discharge Diagnoses:  Active  Problems:   ICH (intracerebral hemorrhage) (HCC)  From inpatient rehab discharge summary: Discharge Diagnoses:  Principal Problem:   ICH (intracerebral hemorrhage) (HCC) Active Problems:   Slow transit constipation   Chronic gout of right knee   Acute blood loss anemia   Leukocytosis   Controlled type 2 diabetes mellitus with hyperglycemia, without long-term current use of insulin (HCC)   Essential hypertension   AKI (acute kidney injury) (HCC)   Benign essential HTN   Pain Vesicocutaneous fistula  Brief HPI:   Anthony Cassishai Daugherty is a 60 y.o. right-handed male with history of open ure[place]olithotomy 2005 in [place] as well as left PCNL and laser ureterotomy October 2018 per Dr. [name], CAD, diabetes mellitus, hypertension and remote tobacco abuse.  Per chart review lives with spouse independent prior to admission 1 level home.  Works at a Education officer, [employer].  Presented 04/06/2020 with altered mental status headache and left-sided weakness.  Blood pressure 170/100.  Cranial CT scan showed a 4.3 x 2.4 x 3.4 cm acute intraparenchymal hemorrhage centered at the right basal ganglia.  Localized mass-effect with up to 5 mm right to left shift.  Admission chemistries unremarkable except hemoglobin A1c 5.8 WBC 15,700.  MRI/MRI showed stable size and morphology of right basal ganglia intraparenchymal hemorrhage with similar surrounding vasogenic edema and regional mass-effect.  Associated 5 mm right to left shift.  No intraventricular extension hydrocephalus or ventricular trapping.  MRA was unremarkable.  Echocardiogram with ejection fraction of 65 to 70% no wall motion abnormalities.  Maintained on Cleviprex for blood pressure control.  Patient was cleared to begin subcutaneous heparin for DVT prophylaxis 04/06/2020.  Hospital course follow-up urology services  04/08/2020 for evaluation of leaking urine from incision line which had been intermittent since ureterotomy.  CT abdomen pelvis showed several tiny stones in the distal  left ureter.  Moderate left hydroureter and severe left hydronephrosis.  Severe left renal parenchymal atrophy and cortical thinning secondary to chronic hydronephrosis.  Linear band to the proximal left ureter with focal luminal narrowing likely representing adhesion or scarring with 2 punctate nonobstructive left renal inferior pole calculi and fluid cultures were sent and Foley tube inserted.  Review of films appeared to have vesicocutaneous fistula and it was felt he could be easily managed by Foley tube that would remain in place per urology services.  Therapy evaluations completed due to patient's decreased functional mobility was admitted for a comprehensive rehab program.     Hospital Course: Zackary Mckeone was admitted to rehab 04/11/2020 for inpatient therapies to consist of PT, ST and OT at least three hours five days a week. Past admission physiatrist, therapy team and rehab RN have worked together to provide customized collaborative inpatient rehab.  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  Patient had been placed on Ritalin to help establish maintenance of focus and attention to tasks.  He had been cleared to begin subcutaneous heparin for DVT prophylaxis during his hospital course 04/06/2020.  No bleeding episodes.  Patient has some intermittent low back pain using Tylenol as needed the addition of a K pad as well as Lidoderm patch.  Blood pressure controlled on Norvasc and Lopressor blood pressure somewhat soft clonidine has been discontinued.  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  Blood sugars monitored hemoglobin A1c 5.8 metformin as directed with  diabetic teaching.  AKI/CKD latest creatinine 1.14 and monitored.  Suspect gout versus osteoarthritis right knee x-ray showed mild degenerative change small effusion Voltaren gel added placed on low-dose colchicine.     Blood pressures were monitored on TID basis and soft and monitored   Diabetes has been monitored with ac/hs CBG checks and SSI was use prn for tighter BS control.     Rehab course: During patient's stay in rehab weekly team conferences were held to monitor patient's progress, set goals and discuss barriers to discharge. At admission, patient required max assist stand pivot transfers max assist squat pivot transfers max assist side-lying to sitting moderate assist for rolling max assist upper body bathing total assist lower body bathing max a set body dressing total assist lower body dressing  Meds: Follow-up urology services Dr. Marlou Porch [phone] in regards to vesicocutaneous fistula and plan of care   Medications at discharge 1.  Tylenol as needed 2.  Norvasc 10 mg p.o. daily 3.  Lipitor 40 mg p.o. nightly 4.  Colchicine 0.6 mg p.o. daily 5.  Voltaren gel 2 g 4 times daily to affected area 6.  Lidoderm patch change as directed 7.  Glucophage 500 mg p.o. daily with breakfast 8.  Ritalin 5 mg p.o. twice daily 9.  Lopressor 50 mg p.o. twice daily 10.  Multivitamin daily 11.  Protonix 40 mg p.o. daily 12.  MiraLAX daily hold for loose stools 13.  Senokot 1 tablet p.o. twice daily    ED/Hospital notes reviewed and summarized above  ROS:   Constitutional:  No  f/c, No night sweats, No unexplained weight loss. EENT:  No vision changes, No blurry vision, No hearing changes. No mouth, throat, or ear problems.  Respiratory: No cough, No SOB Cardiac: No CP, no palpitations GI:  No abd pain, No N/V/D. GU: No Urinary s/sx Musculoskeletal: No joint pain Neuro: No headache, no dizziness, no motor weakness.  Skin: No rash Endocrine:  No polydipsia. No polyuria.  Psych: Denies  SI/HI  No problems updated.  ALLERGIES: No Known Allergies  PAST MEDICAL HISTORY: Past Medical History:  Diagnosis Date   Coronary artery disease    Diabetes mellitus without complication (HCC)    Hypertension     MEDICATIONS AT HOME: Prior to Admission medications   Medication Sig Start Date End Date Taking? Authorizing Provider  acetaminophen (TYLENOL) 325 MG tablet Take 2 tablets (650 mg total) by mouth every 4 (four) hours as needed for mild pain (or temp > 37.5 C (99.5 F)). 05/16/20   Angiulli, Mcarthur Rossetti, PA-C  amLODipine (NORVASC) 10 MG tablet Take 1 tablet (10 mg total) by mouth daily. 06/26/20   Anders Simmonds, PA-C  atorvastatin (LIPITOR) 40 MG tablet Take 1 tablet (40 mg total) by mouth daily. 06/26/20 06/26/21  Anders Simmonds, PA-C  colchicine 0.6 MG tablet Take 1 tablet (0.6 mg total) by mouth daily. 06/26/20   Anders Simmonds, PA-C  diclofenac Sodium (VOLTAREN) 1 % GEL Apply 2 g topically 4 (four) times daily. 06/26/20   Anders Simmonds, PA-C  lidocaine (LIDODERM) 5 % Place 1 patch onto the skin daily. Remove & Discard patch within 12 hours or as directed by MD 06/26/20   Anders Simmonds, PA-C  metFORMIN (GLUCOPHAGE) 500 MG tablet Take 1 tablet (500 mg total) by mouth daily with breakfast. 06/26/20   Anders Simmonds, PA-C  methylphenidate (RITALIN) 5 MG tablet Take 1 tablet (5 mg total) by mouth 2 (two) times daily with breakfast and lunch. 05/16/20   Angiulli, Mcarthur Rossetti, PA-C  metoprolol tartrate (LOPRESSOR) 50 MG tablet Take 1 tablet (50 mg total) by mouth 2 (two) times daily. 06/26/20   Anders Simmonds, PA-C  Multiple Vitamin (MULTIVITAMIN WITH MINERALS) TABS tablet Take 1 tablet by mouth daily. 05/16/20   Angiulli, Mcarthur Rossetti, PA-C  nitrofurantoin, macrocrystal-monohydrate, (MACROBID) 100 MG capsule Take 1 capsule (100 mg total) by mouth 2 (two) times daily. 05/18/20   Marcello Fennel, MD  pantoprazole (PROTONIX) 40 MG tablet Take 1 tablet (40 mg total) by mouth daily.  05/16/20   Angiulli, Mcarthur Rossetti, PA-C  polyethylene glycol (MIRALAX / GLYCOLAX) 17 g packet Take 17 g by mouth daily. 05/16/20   Angiulli, Mcarthur Rossetti, PA-C  senna-docusate (SENOKOT-S) 8.6-50 MG tablet Take 1 tablet by mouth 2 (two) times daily. 05/16/20   Angiulli, Mcarthur Rossetti, PA-C     Objective:  EXAM:   Vitals:   06/26/20 1439  BP: (!) 144/95  Pulse: 98  Resp: 16  SpO2: 96%    General appearance : A&OX3. NAD. Non-toxic-appearing;  in wheelchair.  Patient does not talk-unclear if from stroke or preference.   HEENT: Atraumatic and Normocephalic.  PERRLA. Mouth-MMM, post pharynx WNL w/o erythema, No PND. Neck: supple, no JVD. No cervical lymphadenopathy. No thyromegaly Chest/Lungs:  Breathing-non-labored, Good air entry bilaterally, breath sounds normal without rales, rhonchi, or wheezing  CVS: S1 S2 regular, no murmurs, gallops, rubs  Extremities: Bilateral Lower Ext shows no edema, both legs are warm to touch with = pulse throughout Skin:  No Rash  Data Review Lab Results  Component Value Date   HGBA1C 5.8 (H) 04/06/2020   HGBA1C 6.3 (H) 09/15/2016     Assessment & Plan   1. Controlled type 2 diabetes mellitus with hyperglycemia, without long-term current use of insulin (HCC) Continue current regimen - Glucose (CBG) - metFORMIN (GLUCOPHAGE) 500 MG tablet; Take 1 tablet (500 mg total) by mouth daily with breakfast.  Dispense: 90 tablet; Refill: 1  2. AKI (acute kidney injury) (HCC) Check CMP  3. Acute blood loss anemia - CBC with Differential/Platelet  4. Leukocytosis, unspecified type - CBC with Differential/Platelet  5. 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  Dispense: 90 tablet; Refill: 1 - Ambulatory referral to Neurology - Comprehensive metabolic panel  6. Benign essential HTN Did not take meds today(out of meds for 3 days)-BP 121/80 when taking meds. -  amLODipine (NORVASC) 10 MG tablet; Take 1 tablet (10 mg total) by mouth daily.  Dispense: 90 tablet; Refill: 1 - Comprehensive metabolic panel  7. Hospital discharge follow-up   8. Language barrier Language resource interpreter and son translated and additional time performing visit was required.      Patient have been counseled extensively about nutrition and exercise  Return in about 2 months (around 08/26/2020) for assign PCP;  f/up BP/stroke/prediabetes.  The patient was given clear instructions to go to ER or return to medical center if symptoms don't improve, worsen or new problems develop. The patient verbalized understanding. The patient was told to call to get lab results if they haven't heard anything in the next week.     Georgian Co, PA-C [facility] [place], [place] [phone]   06/26/2020, 2:45 PM

## 2020-06-27 ENCOUNTER — Other Ambulatory Visit: Payer: Self-pay | Admitting: Physician Assistant

## 2020-06-27 ENCOUNTER — Other Ambulatory Visit: Payer: Self-pay

## 2020-06-27 LAB — CBC WITH DIFFERENTIAL/PLATELET
Basophils Absolute: 0 10*3/uL (ref 0.0–0.2)
Basos: 0 %
EOS (ABSOLUTE): 1.3 10*3/uL — ABNORMAL HIGH (ref 0.0–0.4)
Eos: 15 %
Hematocrit: 36.7 % — ABNORMAL LOW (ref 37.5–51.0)
Hemoglobin: 11.1 g/dL — ABNORMAL LOW (ref 13.0–17.7)
Immature Grans (Abs): 0 10*3/uL (ref 0.0–0.1)
Immature Granulocytes: 0 %
Lymphocytes Absolute: 2.5 10*3/uL (ref 0.7–3.1)
Lymphs: 30 %
MCH: 21.6 pg — ABNORMAL LOW (ref 26.6–33.0)
MCHC: 30.2 g/dL — ABNORMAL LOW (ref 31.5–35.7)
MCV: 72 fL — ABNORMAL LOW (ref 79–97)
Monocytes Absolute: 0.8 10*3/uL (ref 0.1–0.9)
Monocytes: 10 %
Neutrophils Absolute: 3.8 10*3/uL (ref 1.4–7.0)
Neutrophils: 45 %
Platelets: 189 10*3/uL (ref 150–450)
RBC: 5.13 x10E6/uL (ref 4.14–5.80)
RDW: 17.1 % — ABNORMAL HIGH (ref 11.6–15.4)
WBC: 8.5 10*3/uL (ref 3.4–10.8)

## 2020-06-27 LAB — COMPREHENSIVE METABOLIC PANEL
ALT: 26 IU/L (ref 0–44)
AST: 24 IU/L (ref 0–40)
Albumin/Globulin Ratio: 1.1 — ABNORMAL LOW (ref 1.2–2.2)
Albumin: 4.1 g/dL (ref 3.8–4.8)
Alkaline Phosphatase: 145 IU/L — ABNORMAL HIGH (ref 44–121)
BUN/Creatinine Ratio: 14 (ref 10–24)
BUN: 13 mg/dL (ref 8–27)
Bilirubin Total: <0.2 mg/dL (ref 0.0–1.2)
CO2: 20 mmol/L (ref 20–29)
Calcium: 9.2 mg/dL (ref 8.6–10.2)
Chloride: 103 mmol/L (ref 96–106)
Creatinine, Ser: 0.91 mg/dL (ref 0.76–1.27)
Globulin, Total: 3.6 g/dL (ref 1.5–4.5)
Glucose: 90 mg/dL (ref 65–99)
Potassium: 4.5 mmol/L (ref 3.5–5.2)
Sodium: 140 mmol/L (ref 134–144)
Total Protein: 7.7 g/dL (ref 6.0–8.5)
eGFR: 96 mL/min/{1.73_m2} (ref >59)

## 2020-06-27 MED ORDER — FERROUS SULFATE 325 (65 FE) MG PO TABS
325.0000 mg | ORAL_TABLET | Freq: Every day | ORAL | 2 refills | Status: DC
  Filled 2020-06-27: qty 30, 30d supply, fill #0

## 2020-07-11 ENCOUNTER — Encounter: Payer: Self-pay | Admitting: *Deleted

## 2020-08-13 ENCOUNTER — Inpatient Hospital Stay: Payer: BC Managed Care – PPO | Admitting: Adult Health

## 2020-08-13 ENCOUNTER — Encounter: Payer: Self-pay | Admitting: Adult Health

## 2020-08-13 NOTE — Progress Notes (Deleted)
[facility] [address] [place]. [place] [address] [phone]       HOSPITAL FOLLOW UP NOTE  Anthony Huber Date of Birth:  1960-12-27 Medical Record Number:  [id number]   Reason for Referral:  hospital stroke follow up    SUBJECTIVE:   CHIEF COMPLAINT:  No chief complaint on file.   HPI:           ROS:   14 system review of systems performed and negative with exception of ***  PMH:  Past Medical History:  Diagnosis Date   Coronary artery disease    Diabetes mellitus without complication (HCC)    Hypertension     PSH:  Past Surgical History:  Procedure Laterality Date   BACK SURGERY     BLADDER SURGERY     CORONARY ANGIOPLASTY WITH STENT PLACEMENT     CYSTOSCOPY WITH STENT PLACEMENT Left 09/05/2016   Procedure: CYSTOSCOPY, URETEROSCOPY;  Surgeon: Crist Fat, MD;  Location: [facility];  Service: Urology;  Laterality: Left;   IR NEPHROSTOMY PLACEMENT LEFT  09/06/2016   NEPHROLITHOTOMY Left 09/16/2016   Procedure: NEPHROLITHOTOMY PERCUTANEOUS  LEFT;  Surgeon: Crist Fat, MD;  Location: [facility];  Service: Urology;  Laterality: Left;    Social History:  Social History   Socioeconomic History   Marital status: Married    Spouse name: Not on file   Number of children: Not on file   Years of education: Not on file   Highest education level: Not on file  Occupational History   Not on file  Tobacco Use   Smoking status: Former    Types: Cigarettes    Quit date: 2016    Years since quitting: 6.6   Smokeless tobacco: Never  Vaping Use   Vaping Use: Never used  Substance and Sexual Activity   Alcohol use: No   Drug use: No   Sexual activity: Not on file  Other Topics Concern   Not on file  Social History Narrative   Not on file   Social Determinants of Health   Financial Resource Strain: Not on file  Food Insecurity: Not on file  Transportation Needs: Not on file  Physical Activity: Not on file  Stress: Not on  file  Social Connections: Not on file  Intimate Partner Violence: Not on file    Family History: No family history on file.  Medications:   Current Outpatient Medications on File Prior to Visit  Medication Sig Dispense Refill   acetaminophen (TYLENOL) 325 MG tablet Take 2 tablets (650 mg total) by mouth every 4 (four) hours as needed for mild pain (or temp > 37.5 C (99.5 F)).     amLODipine (NORVASC) 10 MG tablet Take 1 tablet (10 mg total) by mouth daily. 90 tablet 1   atorvastatin (LIPITOR) 40 MG tablet Take 1 tablet (40 mg total) by mouth daily. 90 tablet 1   colchicine 0.6 MG tablet Take 1 tablet (0.6 mg total) by mouth daily. 30 tablet 4   diclofenac Sodium (VOLTAREN) 1 % GEL Apply 2 g topically 4 (four) times daily. 200 g 3   ferrous sulfate 325 (65 FE) MG tablet Take 1 tablet (325 mg total) by mouth daily. 30 tablet 2   lidocaine (LIDODERM) 5 % Place 1 patch onto the skin daily. Remove & Discard patch within 12 hours or as directed by MD 30 patch 2   metFORMIN (GLUCOPHAGE) 500 MG tablet Take 1 tablet (500 mg total) by mouth daily  with breakfast. 90 tablet 1   methylphenidate (RITALIN) 5 MG tablet Take 1 tablet (5 mg total) by mouth 2 (two) times daily with breakfast and lunch. 60 tablet 0   metoprolol tartrate (LOPRESSOR) 50 MG tablet Take 1 tablet (50 mg total) by mouth 2 (two) times daily. 180 tablet 1   Multiple Vitamin (MULTIVITAMIN WITH MINERALS) TABS tablet Take 1 tablet by mouth daily.     nitrofurantoin, macrocrystal-monohydrate, (MACROBID) 100 MG capsule Take 1 capsule (100 mg total) by mouth 2 (two) times daily. 14 capsule 0   pantoprazole (PROTONIX) 40 MG tablet Take 1 tablet (40 mg total) by mouth daily. 30 tablet 0   polyethylene glycol (MIRALAX / GLYCOLAX) 17 g packet Take 17 g by mouth daily. 14 each 0   senna-docusate (SENOKOT-S) 8.6-50 MG tablet Take 1 tablet by mouth 2 (two) times daily.     No current facility-administered medications on file prior to visit.     Allergies:  No Known Allergies    OBJECTIVE:  Physical Exam  There were no vitals filed for this visit. There is no height or weight on file to calculate BMI. No results found.  No flowsheet data found.   General: well developed, well nourished, seated, in no evident distress Head: head normocephalic and atraumatic.   Neck: supple with no carotid or supraclavicular bruits Cardiovascular: regular rate and rhythm, no murmurs Musculoskeletal: no deformity Skin:  no rash/petichiae Vascular:  Normal pulses all extremities   Neurologic Exam Mental Status: Awake and fully alert. Oriented to place and time. Recent and remote memory intact. Attention span, concentration and fund of knowledge appropriate. Mood and affect appropriate.  Cranial Nerves: Fundoscopic exam reveals sharp disc margins. Pupils equal, briskly reactive to light. Extraocular movements full without nystagmus. Visual fields full to confrontation. Hearing intact. Facial sensation intact. Face, tongue, palate moves normally and symmetrically.  Motor: Normal bulk and tone. Normal strength in all tested extremity muscles Sensory.: intact to touch , pinprick , position and vibratory sensation.  Coordination: Rapid alternating movements normal in all extremities. Finger-to-nose and heel-to-shin performed accurately bilaterally. Gait and Station: Arises from chair without difficulty. Stance is normal. Gait demonstrates normal stride length and balance with ***. Tandem walk and heel toe ***.  Reflexes: 1+ and symmetric. Toes downgoing.     NIHSS  *** Modified Rankin  ***      ASSESSMENT: Anthony Huber is a 60 y.o. year old male presented with *** on *** secondary to ***. Vascular risk factors include ***.      PLAN:  *** : Residual deficit: ***. Continue {anticoagulants:31417}  and ***  for secondary stroke prevention.  Discussed secondary stroke prevention measures and importance of close PCP follow up for  aggressive stroke risk factor management. I have gone over the pathophysiology of stroke, warning signs and symptoms, risk factors and their management in some detail with instructions to go to the closest emergency room for symptoms of concern. HTN: BP goal <130/90.  Stable on *** per PCP HLD: LDL goal <70. Recent LDL ***.  DMII: A1c goal<7.0. Recent A1c ***.     Follow up in *** or call earlier if needed   CC:  [facility] provider: Dr. Pearlean Brownie PCP: Anders Simmonds, PA-C    I spent *** minutes of face-to-face and non-face-to-face time with patient.  This included previsit chart review, lab review, study review, order entry, electronic health record documentation, patient education regarding recent stroke, residual deficits, importance of managing stroke risk factors  and answered all questions to patient satisfaction     Anthony Huber, [facility]  [facility] [address] [place], [place] [address]  Phone [phone] Fax [phone] Note: This document was prepared with digital dictation and possible smart phrase technology. Any transcriptional errors that result from this process are unintentional.

## 2020-08-15 ENCOUNTER — Other Ambulatory Visit: Payer: Self-pay

## 2020-08-26 ENCOUNTER — Other Ambulatory Visit: Payer: Self-pay

## 2020-08-26 ENCOUNTER — Ambulatory Visit: Payer: BC Managed Care – PPO | Attending: Critical Care Medicine | Admitting: Critical Care Medicine

## 2020-08-26 ENCOUNTER — Encounter: Payer: Self-pay | Admitting: Critical Care Medicine

## 2020-08-26 VITALS — BP 168/101 | HR 96 | Resp 16 | Wt 98.6 lb

## 2020-08-26 DIAGNOSIS — I610 Nontraumatic intracerebral hemorrhage in hemisphere, subcortical: Secondary | ICD-10-CM

## 2020-08-26 DIAGNOSIS — I160 Hypertensive urgency: Secondary | ICD-10-CM

## 2020-08-26 DIAGNOSIS — M1A0610 Idiopathic chronic gout, right knee, without tophus (tophi): Secondary | ICD-10-CM

## 2020-08-26 DIAGNOSIS — N179 Acute kidney failure, unspecified: Secondary | ICD-10-CM

## 2020-08-26 DIAGNOSIS — K5901 Slow transit constipation: Secondary | ICD-10-CM

## 2020-08-26 DIAGNOSIS — E1165 Type 2 diabetes mellitus with hyperglycemia: Secondary | ICD-10-CM

## 2020-08-26 DIAGNOSIS — I10 Essential (primary) hypertension: Secondary | ICD-10-CM

## 2020-08-26 DIAGNOSIS — I6930 Unspecified sequelae of cerebral infarction: Secondary | ICD-10-CM

## 2020-08-26 LAB — GLUCOSE, POCT (MANUAL RESULT ENTRY): POC Glucose: 156 mg/dl — AB (ref 70–99)

## 2020-08-26 MED ORDER — METFORMIN HCL 500 MG PO TABS
500.0000 mg | ORAL_TABLET | Freq: Every day | ORAL | 1 refills | Status: DC
  Filled 2020-08-26: qty 30, 30d supply, fill #0
  Filled 2020-10-03: qty 30, 30d supply, fill #1
  Filled 2020-10-31: qty 30, 30d supply, fill #2

## 2020-08-26 MED ORDER — CLONIDINE HCL 0.1 MG PO TABS: 0.1000 mg | ORAL_TABLET | Freq: Once | ORAL | Status: DC

## 2020-08-26 MED ORDER — FERROUS SULFATE 325 (65 FE) MG PO TABS
325.0000 mg | ORAL_TABLET | Freq: Every day | ORAL | 2 refills | Status: DC
  Filled 2020-08-26: qty 30, 30d supply, fill #0

## 2020-08-26 MED ORDER — ATORVASTATIN CALCIUM 40 MG PO TABS
40.0000 mg | ORAL_TABLET | Freq: Every day | ORAL | 1 refills | Status: DC
  Filled 2020-08-26: qty 30, 30d supply, fill #0
  Filled 2020-10-03: qty 30, 30d supply, fill #1
  Filled 2020-10-31: qty 30, 30d supply, fill #2
  Filled 2020-12-16: qty 30, 30d supply, fill #3

## 2020-08-26 MED ORDER — POLYETHYLENE GLYCOL 3350 17 G PO PACK
17.0000 g | PACK | Freq: Every day | ORAL | 0 refills | Status: DC
  Filled 2020-08-26: qty 28, 28d supply, fill #0

## 2020-08-26 MED ORDER — CARVEDILOL 25 MG PO TABS
25.0000 mg | ORAL_TABLET | Freq: Two times a day (BID) | ORAL | 3 refills | Status: DC
  Filled 2020-08-26: qty 60, 30d supply, fill #0
  Filled 2020-10-03: qty 60, 30d supply, fill #1
  Filled 2020-10-31: qty 60, 30d supply, fill #2
  Filled 2020-12-16: qty 60, 30d supply, fill #3

## 2020-08-26 MED ORDER — POLYETHYLENE GLYCOL 3350 17 GM/SCOOP PO POWD
17.0000 g | Freq: Two times a day (BID) | ORAL | 1 refills | Status: DC | PRN
  Filled 2020-08-26: qty 476, 28d supply, fill #0

## 2020-08-26 MED ORDER — VALSARTAN-HYDROCHLOROTHIAZIDE 320-25 MG PO TABS
1.0000 | ORAL_TABLET | Freq: Every day | ORAL | 3 refills | Status: DC
  Filled 2020-08-26: qty 30, 30d supply, fill #0
  Filled 2020-10-03: qty 30, 30d supply, fill #1
  Filled 2020-10-31: qty 30, 30d supply, fill #2

## 2020-08-26 MED ORDER — AMLODIPINE BESYLATE 10 MG PO TABS
10.0000 mg | ORAL_TABLET | Freq: Every day | ORAL | 1 refills | Status: DC
  Filled 2020-08-26: qty 30, 30d supply, fill #0
  Filled 2020-10-03: qty 30, 30d supply, fill #1
  Filled 2020-10-31: qty 30, 30d supply, fill #2
  Filled 2020-12-16: qty 90, 90d supply, fill #3

## 2020-08-26 MED ORDER — SENNOSIDES-DOCUSATE SODIUM 8.6-50 MG PO TABS
2.0000 | ORAL_TABLET | Freq: Every day | ORAL | 3 refills | Status: DC
  Filled 2020-08-26: qty 60, 30d supply, fill #0

## 2020-08-26 NOTE — Assessment & Plan Note (Signed)
Recheck metabolic panel.   

## 2020-08-26 NOTE — Assessment & Plan Note (Signed)
Colchicine refilled

## 2020-08-26 NOTE — Progress Notes (Signed)
New Patient Office Visit  Subjective:  Patient ID: Anthony Huber, Huber    DOB: Jul 12, 1960  Age: 60 y.o. MRN: [id number]  CC:  Chief Complaint  Patient presents with   Establish Care   Hypertension   Prediabetes     HPI Anthony Huber presents for PCP to establish This is a 60 year old Montegnard Huber who is seen today accompanied by in person interpreterWeier Huber and the patient's spouse.  The patient had been seen in June by PA Anthony Huber after prolonged hospitalization between April and May.  Below is documentation from that visit Anthony Huber 06/2020 Anthony Huber is a 60 y.o. Huber here today to establish care and for a follow up visit after brain hemorrhage/hospitalization and subsequent inpatient rehab. After hospitalization 4/2-04/11/2020 after intracranial hemorrhage and subsequent inpatient rehab 4/7-5/13/2022.  He has seen his urologist since discharge and has another follow up scheduled.  A referral was placed for neurologist but I do not see an actual appt scheduled.  Combination of language resources and the son of the patient provided translation.  His wife is with him.  His appetite seems to be steady.  He is using a wheelchair.  He continues to need help with most ADL which his wife helps him with.  Needs meds RF.  Did not take amlodipine today.       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   Hypertensive emergency: Required Cleviprex infusion on admission-BP now stable.   HTN: BP stable-continue amlodipine, metoprolol and clonidine.  SBP goal less than 160.   HLD:  Start statin on discharge.   Vesicocutaneous fistula: Urology following with recommendations to keep Foley catheter in place for at least 2 to 4 weeks   Left hydronephrosis: Discussed with urology-Anthony Huber-on 4/6-chronic issue-does not require any further  intervention.   Leukocytosis: Probably due to stress margination-downtrending-no indication of any infection.    Follow periodically.   Mild AKI: Volume status stable-stable for continued close follow-up.   History of CAD: No anginal symptoms-unable to start antiplatelets due to ICH.    from inpatient rehab discharge 4/7-5/13/2022:   Discharge Diagnoses:  Active Problems:   ICH (intracerebral hemorrhage) (HCC)   From inpatient rehab discharge summary: Discharge Diagnoses:  Principal Problem:   ICH (intracerebral hemorrhage) (HCC) Active Problems:   Slow transit constipation   Chronic gout of right knee   Acute blood loss anemia   Leukocytosis   Controlled type 2 diabetes mellitus with hyperglycemia, without long-term current use of insulin (HCC)   Essential hypertension   AKI (acute kidney injury) (HCC)   Benign essential HTN   Pain Vesicocutaneous fistula   Brief HPI:   Anthony Huber with history of open ure[place]olithotomy 2005 [place] as well as left PCNL and laser ureterotomy October 2018 per Dr. [name], CAD, diabetes mellitus, hypertension and remote tobacco abuse.  Per chart review lives with spouse independent prior to admission 1 level home.  Works at a Education officer, [employer].  Presented 04/06/2020 with altered mental status headache and left-sided weakness.  Blood pressure 170/100.  Cranial CT scan showed a 4.3 x 2.4 x 3.4 cm acute intraparenchymal hemorrhage centered at the right basal ganglia.  Localized mass-effect with up to 5 mm right to left shift.  Admission chemistries unremarkable except hemoglobin A1c 5.8 WBC 15,700.  MRI/MRI showed stable size and morphology of right basal ganglia intraparenchymal  hemorrhage with similar surrounding vasogenic edema and regional mass-effect.  Associated 5 mm right to left shift.  No intraventricular extension hydrocephalus or ventricular trapping.  MRA was unremarkable.  Echocardiogram with ejection fraction of 65 to  70% no wall motion abnormalities.  Maintained on Cleviprex for blood pressure control.  Patient was cleared to begin subcutaneous heparin for DVT prophylaxis 04/06/2020.  Hospital course follow-up urology services 04/08/2020 for evaluation of leaking urine from incision line which had been intermittent since ureterotomy.  CT abdomen pelvis showed several tiny stones in the distal left ureter.  Moderate left hydroureter and severe left hydronephrosis.  Severe left renal parenchymal atrophy and cortical thinning secondary to chronic hydronephrosis.  Linear band to the proximal left ureter with focal luminal narrowing likely representing adhesion or scarring with 2 punctate nonobstructive left renal inferior pole calculi and fluid cultures were sent and Foley tube inserted.  Review of films appeared to have vesicocutaneous fistula and it was felt he could be easily managed by Foley tube that would remain in place per urology services.  Therapy evaluations completed due to patient's decreased functional mobility was admitted for a comprehensive rehab program.     Hospital Course: [name] was admitted to rehab 04/11/2020 for inpatient therapies to consist of PT, ST and OT at least three hours five days a week. Past admission physiatrist, therapy team and rehab RN have worked together to provide customized collaborative inpatient rehab.  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  Patient had been placed on Ritalin to help establish maintenance of focus and attention to tasks.  He had been cleared to begin subcutaneous heparin for DVT prophylaxis during his hospital course 04/06/2020.  No bleeding episodes.  Patient has some intermittent low back pain using Tylenol as needed the addition of a K pad as well as Lidoderm patch.  Blood pressure controlled on Norvasc and Lopressor blood pressure  somewhat soft clonidine has been discontinued.  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  Blood sugars monitored hemoglobin A1c 5.8 metformin as directed with diabetic teaching.  AKI/CKD latest creatinine 1.14 and monitored.  Suspect gout versus osteoarthritis right knee x-ray showed mild degenerative change small effusion Voltaren gel added placed on low-dose colchicine.     Blood pressures were monitored on TID basis and soft and monitored   Diabetes has been monitored with ac/hs CBG checks and SSI was use prn for tighter BS control.     Rehab course: During patient's stay in rehab weekly team conferences were held to monitor patient's progress, set goals and discuss barriers to discharge. At admission, patient required max assist stand pivot transfers max assist squat pivot transfers max assist side-lying to sitting moderate assist for rolling max assist upper body bathing total assist lower body bathing max a set body dressing total assist lower body dressing   Meds: Follow-up urology services Dr. Marlou Porch [phone] in regards to vesicocutaneous fistula and plan of care   Medications at discharge 1.  Tylenol as needed 2.  Norvasc 10 mg p.o. daily 3.  Lipitor 40 mg p.o. nightly 4.  Colchicine 0.6 mg p.o. daily 5.  Voltaren gel 2 g 4 times daily to affected area 6.  Lidoderm patch change as  directed 7.  Glucophage 500 mg p.o. daily with breakfast 8.  Ritalin 5 mg p.o. twice daily 9.  Lopressor 50 mg p.o. twice daily 10.  Multivitamin daily 11.  Protonix 40 mg p.o. daily 12.  MiraLAX daily hold for loose stools 13.  Senokot 1 tablet p.o. twice daily     ED/Hospital notes reviewed and summarized above  1. Controlled type 2 diabetes mellitus with hyperglycemia, without  long-term current use of insulin (HCC) Continue current regimen - Glucose (CBG) - metFORMIN (GLUCOPHAGE) 500 MG tablet; Take 1 tablet (500 mg total) by mouth daily with breakfast.  Dispense: 90 tablet; Refill: 1   2. AKI (acute kidney injury) (HCC) Check CMP   3. Acute blood loss anemia - CBC with Differential/Platelet   4. Leukocytosis, unspecified type - CBC with Differential/Platelet   5. 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  Dispense: 90 tablet; Refill: 1 - Ambulatory referral to Neurology - Comprehensive metabolic panel   6. Benign essential HTN Did not take meds today(out of meds for 3 days)-BP 121/80 when taking meds. - amLODipine (NORVASC) 10 MG tablet; Take 1 tablet (10 mg total) by mouth daily.  Dispense: 90 tablet; Refill: 1 - Comprehensive metabolic panel   7. Hospital discharge follow-up  At that visit medications were sent to the [facility] pharmacy because the patient's son that is where the patient obtained his medications however patient never picked up that medicine and he only is taking the colchicine.  On arrival blood pressure was extraordinarily elevated 210/110.  He was given 1 dose of 0.1 mg clonidine and his pressure came down to 168/101 and he was then released  Patient states he had no complaints at this visit he was denying any respiratory complaints or cardiovascular issues.  He still has residual left upper extremity weakness and does not have fine motor movement of his left hand.  He is able to walk with a cane and does not fall.  He has normal bowel and bladder habits.  He does have some constipation and is requesting refills on Senokot and MiraLAX.   Past Medical History:  Diagnosis Date   Coronary artery disease    Diabetes mellitus without complication (HCC)    Hypertension     Past Surgical History:  Procedure Laterality Date    BACK SURGERY     BLADDER SURGERY     CORONARY ANGIOPLASTY WITH STENT PLACEMENT     CYSTOSCOPY WITH STENT PLACEMENT Left 09/05/2016   Procedure: CYSTOSCOPY, URETEROSCOPY;  Surgeon: Crist Fat, MD;  Location: [facility];  Service: Urology;  Laterality: Left;   IR NEPHROSTOMY PLACEMENT LEFT  09/06/2016   NEPHROLITHOTOMY Left 09/16/2016   Procedure: NEPHROLITHOTOMY PERCUTANEOUS  LEFT;  Surgeon: Crist Fat, MD;  Location: [facility];  Service: Urology;  Laterality: Left;    History reviewed. No pertinent family history.  Social History   Socioeconomic History   Marital status: Married    Spouse name: Not on file   Number of children: Not on file   Years of education: Not on file   Highest education level: Not on file  Occupational History   Not on file  Tobacco Use   Smoking status: Former    Types: Cigarettes    Quit date: 2016    Years since quitting: 6.6   Smokeless tobacco: Never  Vaping Use   Vaping Use: Never used  Substance  and Sexual Activity   Alcohol use: No   Drug use: No   Sexual activity: Not on file  Other Topics Concern   Not on file  Social History Narrative   Not on file   Social Determinants of Health   Financial Resource Strain: Not on file  Food Insecurity: Not on file  Transportation Needs: Not on file  Physical Activity: Not on file  Stress: Not on file  Social Connections: Not on file  Intimate Partner Violence: Not on file    ROS Review of Systems  Constitutional: Negative.   HENT: Negative.    Eyes: Negative.   Respiratory: Negative.    Cardiovascular: Negative.   Gastrointestinal: Negative.   Endocrine: Negative.   Genitourinary: Negative.   Musculoskeletal: Negative.   Neurological: Negative.   Psychiatric/Behavioral: Negative.     Objective:   Today's Vitals: BP (!) 168/101   Pulse 96   Resp 16   Wt 98 lb 9.6 oz (44.7 kg)   SpO2 98%   BMI 19.91 kg/m   Physical Exam Constitutional:      Appearance: Normal  appearance. He is normal weight.  HENT:     Head: Normocephalic and atraumatic.     Nose: Nose normal.     Mouth/Throat:     Mouth: Mucous membranes are moist.     Pharynx: Oropharynx is clear.  Eyes:     Extraocular Movements: Extraocular movements intact.     Conjunctiva/sclera: Conjunctivae normal.     Pupils: Pupils are equal, round, and reactive to light.  Cardiovascular:     Rate and Rhythm: Regular rhythm. Tachycardia present.     Pulses: Normal pulses.     Heart sounds: Normal heart sounds.  Pulmonary:     Breath sounds: Normal breath sounds.  Abdominal:     General: Abdomen is flat. Bowel sounds are normal.  Musculoskeletal:        General: Normal range of motion.     Cervical back: Normal range of motion and neck supple.  Skin:    General: Skin is warm and dry.  Neurological:     Mental Status: He is alert. Mental status is at baseline.     Sensory: No sensory deficit.     Motor: Weakness present.     Coordination: Coordination normal.     Gait: Gait abnormal.     Deep Tendon Reflexes: Reflexes normal.     Comments: LUE weakness.  Fine motor movement Left hand diminished  Psychiatric:        Mood and Affect: Mood normal.        Behavior: Behavior normal.        Thought Content: Thought content normal.        Judgment: Judgment normal.    Assessment & Plan:   Problem List Items Addressed This Visit       Cardiovascular and Mediastinum   Essential hypertension    Blood pressure not well controlled as the patient's not been taking any medication  The patient did respond to 1 dose clonidine  Plan is to refill amlodipine 10 mg daily increase Coreg to 25 mg twice daily and begin valsartan HCT 320/25 daily  Obtain basic metabolic panel and blood count      Relevant Medications   cloNIDine (CATAPRES) tablet 0.1 mg   amLODipine (NORVASC) 10 MG tablet   atorvastatin (LIPITOR) 40 MG tablet   carvedilol (COREG) 25 MG tablet   valsartan-hydrochlorothiazide  (DIOVAN-HCT) 320-25 MG tablet   Other  Relevant Orders   Basic Metabolic Panel   CBC with Differential/Platelet   RESOLVED: Benign essential HTN   Relevant Medications   cloNIDine (CATAPRES) tablet 0.1 mg   amLODipine (NORVASC) 10 MG tablet   atorvastatin (LIPITOR) 40 MG tablet   carvedilol (COREG) 25 MG tablet   valsartan-hydrochlorothiazide (DIOVAN-HCT) 320-25 MG tablet     Digestive   Slow transit constipation    Patient still having slow transit constipation we will refill Senokot and MiraLAX        Endocrine   Controlled type 2 diabetes mellitus with hyperglycemia, without long-term current use of insulin (HCC) - Primary    Patient's blood sugar is high today has not been taking his metformin this was refilled      Relevant Medications   atorvastatin (LIPITOR) 40 MG tablet   metFORMIN (GLUCOPHAGE) 500 MG tablet   valsartan-hydrochlorothiazide (DIOVAN-HCT) 320-25 MG tablet   Other Relevant Orders   POCT glucose (manual entry) (Completed)   Basic Metabolic Panel   Microalbumin / creatinine urine ratio     Musculoskeletal and Integument   Chronic gout of right knee    Colchicine refilled        Genitourinary   AKI (acute kidney injury) (HCC)    Recheck metabolic panel        Other   History of stroke/ICH with residual deficit    History of intracerebral hemorrhage with residual deficit currently stable need to get better control blood pressure      Relevant Medications   atorvastatin (LIPITOR) 40 MG tablet   Other Visit Diagnoses     Hypertensive urgency       Relevant Medications   cloNIDine (CATAPRES) tablet 0.1 mg   amLODipine (NORVASC) 10 MG tablet   atorvastatin (LIPITOR) 40 MG tablet   carvedilol (COREG) 25 MG tablet   valsartan-hydrochlorothiazide (DIOVAN-HCT) 320-25 MG tablet   Other Relevant Orders   Basic Metabolic Panel   CBC with Differential/Platelet       Outpatient Encounter Medications as of 08/26/2020  Medication Sig   carvedilol  (COREG) 25 MG tablet Take 1 tablet (25 mg total) by mouth 2 (two) times daily with a meal.   colchicine 0.6 MG tablet Take 1 tablet (0.6 mg total) by mouth daily.   polyethylene glycol powder (GLYCOLAX/MIRALAX) 17 GM/SCOOP powder Take 17 g by mouth 2 (two) times daily as needed.   valsartan-hydrochlorothiazide (DIOVAN-HCT) 320-25 MG tablet Take 1 tablet by mouth daily.   acetaminophen (TYLENOL) 325 MG tablet Take 2 tablets (650 mg total) by mouth every 4 (four) hours as needed for mild pain (or temp > 37.5 C (99.5 F)). (Patient not taking: Reported on 08/26/2020)   amLODipine (NORVASC) 10 MG tablet Take 1 tablet (10 mg total) by mouth daily.   atorvastatin (LIPITOR) 40 MG tablet Take 1 tablet (40 mg total) by mouth daily.   ferrous sulfate 325 (65 FE) MG tablet Take 1 tablet (325 mg total) by mouth daily.   metFORMIN (GLUCOPHAGE) 500 MG tablet Take 1 tablet (500 mg total) by mouth daily with breakfast.   Multiple Vitamin (MULTIVITAMIN WITH MINERALS) TABS tablet Take 1 tablet by mouth daily. (Patient not taking: Reported on 08/26/2020)   senna-docusate (SENOKOT-S) 8.6-50 MG tablet Take 2 tablets by mouth at bedtime.   [DISCONTINUED] amLODipine (NORVASC) 10 MG tablet Take 1 tablet (10 mg total) by mouth daily. (Patient not taking: Reported on 08/26/2020)   [DISCONTINUED] atorvastatin (LIPITOR) 40 MG tablet Take 1 tablet (40 mg total) by  mouth daily. (Patient not taking: Reported on 08/26/2020)   [DISCONTINUED] diclofenac Sodium (VOLTAREN) 1 % GEL Apply 2 g topically 4 (four) times daily. (Patient not taking: Reported on 08/26/2020)   [DISCONTINUED] ferrous sulfate 325 (65 FE) MG tablet Take 1 tablet (325 mg total) by mouth daily. (Patient not taking: Reported on 08/26/2020)   [DISCONTINUED] lidocaine (LIDODERM) 5 % Place 1 patch onto the skin daily. Remove & Discard patch within 12 hours or as directed by MD (Patient not taking: Reported on 08/26/2020)   [DISCONTINUED] metFORMIN (GLUCOPHAGE) 500 MG tablet  Take 1 tablet (500 mg total) by mouth daily with breakfast. (Patient not taking: Reported on 08/26/2020)   [DISCONTINUED] methylphenidate (RITALIN) 5 MG tablet Take 1 tablet (5 mg total) by mouth 2 (two) times daily with breakfast and lunch. (Patient not taking: Reported on 08/26/2020)   [DISCONTINUED] metoprolol tartrate (LOPRESSOR) 50 MG tablet Take 1 tablet (50 mg total) by mouth 2 (two) times daily. (Patient not taking: Reported on 08/26/2020)   [DISCONTINUED] nitrofurantoin, macrocrystal-monohydrate, (MACROBID) 100 MG capsule Take 1 capsule (100 mg total) by mouth 2 (two) times daily. (Patient not taking: Reported on 08/26/2020)   [DISCONTINUED] pantoprazole (PROTONIX) 40 MG tablet Take 1 tablet (40 mg total) by mouth daily. (Patient not taking: Reported on 08/26/2020)   [DISCONTINUED] polyethylene glycol (MIRALAX / GLYCOLAX) 17 g packet Take 17 g by mouth daily. (Patient not taking: Reported on 08/26/2020)   [DISCONTINUED] polyethylene glycol (MIRALAX / GLYCOLAX) 17 g packet Take 17 g by mouth daily.   [DISCONTINUED] senna-docusate (SENOKOT-S) 8.6-50 MG tablet Take 1 tablet by mouth 2 (two) times daily. (Patient not taking: Reported on 08/26/2020)   Facility-Administered Encounter Medications as of 08/26/2020  Medication   cloNIDine (CATAPRES) tablet 0.1 mg    Follow-up: Return in about 6 weeks (around 10/07/2020).   Shan Levans, MD

## 2020-08-26 NOTE — Assessment & Plan Note (Signed)
Blood pressure not well controlled as the patient's not been taking any medication  The patient did respond to 1 dose clonidine  Plan is to refill amlodipine 10 mg daily increase Coreg to 25 mg twice daily and begin valsartan HCT 320/25 daily  Obtain basic metabolic panel and blood count

## 2020-08-26 NOTE — Assessment & Plan Note (Signed)
History of intracerebral hemorrhage with residual deficit currently stable need to get better control blood pressure

## 2020-08-26 NOTE — Assessment & Plan Note (Signed)
Patient's blood sugar is high today has not been taking his metformin this was refilled

## 2020-08-26 NOTE — Assessment & Plan Note (Signed)
Patient still having slow transit constipation we will refill Senokot and MiraLAX

## 2020-08-26 NOTE — Patient Instructions (Addendum)
Refills on all your medications sent to our pharmacy here at the clinic please pick these up before you leave  Discontinue metoprolol and begin Coreg 1 pill twice daily for blood pressure  Begin valsartan HCT 1 pill daily  Continue amlodipine 1 pill daily  Continue atorvastatin 1 pill daily  Refills on Senokot and MiraLAX given for your constipation  Refill on iron supplement and metformin sent to our pharmacy  Return to see Franky Macho our clinical pharmacist in 2 weeks for blood pressure and diabetes check and return to see Dr. Delford Field in 6 weeks  Labs today include metabolic panel blood counts

## 2020-08-27 LAB — CBC WITH DIFFERENTIAL/PLATELET
Basophils Absolute: 0 10*3/uL (ref 0.0–0.2)
Basos: 0 %
EOS (ABSOLUTE): 1.1 10*3/uL — ABNORMAL HIGH (ref 0.0–0.4)
Eos: 14 %
Hematocrit: 36.6 % — ABNORMAL LOW (ref 37.5–51.0)
Hemoglobin: 11.3 g/dL — ABNORMAL LOW (ref 13.0–17.7)
Immature Grans (Abs): 0 10*3/uL (ref 0.0–0.1)
Immature Granulocytes: 0 %
Lymphocytes Absolute: 2.2 10*3/uL (ref 0.7–3.1)
Lymphs: 28 %
MCH: 21.9 pg — ABNORMAL LOW (ref 26.6–33.0)
MCHC: 30.9 g/dL — ABNORMAL LOW (ref 31.5–35.7)
MCV: 71 fL — ABNORMAL LOW (ref 79–97)
Monocytes Absolute: 0.7 10*3/uL (ref 0.1–0.9)
Monocytes: 9 %
Neutrophils Absolute: 3.7 10*3/uL (ref 1.4–7.0)
Neutrophils: 49 %
Platelets: 241 10*3/uL (ref 150–450)
RBC: 5.16 x10E6/uL (ref 4.14–5.80)
RDW: 15.6 % — ABNORMAL HIGH (ref 11.6–15.4)
WBC: 7.7 10*3/uL (ref 3.4–10.8)

## 2020-08-27 LAB — BASIC METABOLIC PANEL
BUN/Creatinine Ratio: 14 (ref 10–24)
BUN: 16 mg/dL (ref 8–27)
CO2: 19 mmol/L — ABNORMAL LOW (ref 20–29)
Calcium: 9.2 mg/dL (ref 8.6–10.2)
Chloride: 104 mmol/L (ref 96–106)
Creatinine, Ser: 1.17 mg/dL (ref 0.76–1.27)
Glucose: 165 mg/dL — ABNORMAL HIGH (ref 65–99)
Potassium: 4.1 mmol/L (ref 3.5–5.2)
Sodium: 140 mmol/L (ref 134–144)
eGFR: 71 mL/min/{1.73_m2} (ref >59)

## 2020-08-27 LAB — MICROALBUMIN / CREATININE URINE RATIO
Creatinine, Urine: 101.7 mg/dL
Microalb/Creat Ratio: 106 mg/g creat — ABNORMAL HIGH (ref 0–29)
Microalbumin, Urine: 107.4 ug/mL

## 2020-09-18 ENCOUNTER — Ambulatory Visit: Payer: BC Managed Care – PPO | Admitting: Pharmacist

## 2020-09-30 ENCOUNTER — Ambulatory Visit: Payer: BC Managed Care – PPO | Admitting: Critical Care Medicine

## 2020-10-03 ENCOUNTER — Encounter: Payer: Self-pay | Admitting: Pharmacist

## 2020-10-03 ENCOUNTER — Ambulatory Visit: Payer: Self-pay | Attending: Critical Care Medicine | Admitting: Pharmacist

## 2020-10-03 ENCOUNTER — Other Ambulatory Visit: Payer: Self-pay

## 2020-10-03 VITALS — BP 145/78 | HR 69

## 2020-10-03 DIAGNOSIS — E1165 Type 2 diabetes mellitus with hyperglycemia: Secondary | ICD-10-CM

## 2020-10-03 LAB — POCT GLYCOSYLATED HEMOGLOBIN (HGB A1C): Hemoglobin A1C: 6.2 % — AB (ref 4.0–5.6)

## 2020-10-03 NOTE — Progress Notes (Signed)
    S:     No chief complaint on file.  Patient arrives in good spirits accompanied by his wife and interpreter, Loistine Chance.  Presents for diabetes and hypertension evaluation, education, and management. Patient was referred and last seen by Primary Care Provider on 08/26/2020. At that visit his BP was not controlled as patient had not been taking medication. Carvedilol was increased to 25 mg BID and valsartan/HCTZ 320/25 mg daily was initiated.  Family/Social History:  Fhx: no pertinent positives  Tobacco: former smoker  Alcohol: none reported   Insurance coverage/medication affordability: self pay  Medication adherence reported until about 1 week ago, when patient ran out of medication.    Current diabetes medications include: metformin 500 mg daily Current hypertension medications include: amlodipine 10 mg daily, carvedilol 25 mg BID, valsartan/HCTZ 320/25 daily Current hyperlipidemia medications include: atorvastatin 40 mg   Patient reported dietary habits: not addressed at this visit  Patient-reported exercise habits: none to report, patient has trouble walking s/p ICH   Patient reports nocturia (nighttime urination).  Patient denies neuropathy (nerve pain). Patient reports visual changes. Patient reports self foot exams.     O:    Lab Results  Component Value Date   HGBA1C 6.2 (A) 10/03/2020   Vitals:   10/03/20 1000  BP: (!) 145/78  Pulse: 69    Lipid Panel     Component Value Date/Time   CHOL 173 04/07/2020 1644   TRIG 130 04/11/2020 0418   HDL 36 (L) 04/07/2020 1644   CHOLHDL 4.8 04/07/2020 1644   VLDL 48 (H) 04/07/2020 1644   LDLCALC 89 04/07/2020 1644     Clinical Atherosclerotic Cardiovascular Disease (ASCVD): Yes  The 10-year ASCVD risk score (Arnett DK, et al., 2019) is: 26.6%   Values used to calculate the score:     Age: 60 years     Sex: Male     Is Non-Hispanic African American: No     Diabetic: Yes     Tobacco smoker: No     Systolic Blood  Pressure: 145 mmHg     Is BP treated: Yes     HDL Cholesterol: 36 mg/dL     Total Cholesterol: 173 mg/dL    A/P: Diabetes longstanding currently well controlled. Although patient has been out of medication for a week, adherence prior to running out appears appropriate. POCT A1c suggests control is optimal.  -Continued metformin 500 mg daily -Counseled patient on adherence and how to get refills through the [facility] pharmacy -Next A1C anticipated 01/2021.   ASCVD risk - secondary prevention in patient with diabetes. Last LDL is not controlled. Ideally LDL goal would be <70. ASCVD present high intensity statin indicated. Aspirin is not indicated.  -Continued atorvastatin 40 mg daily   Hypertension longstanding currently above goal.  Blood pressure goal = <130/80 mmHg. Medication adherence appears appropriate prior to running out of medication.  Blood pressure control is suboptimal due to current non adherence. Because BP is close to goal and patient has not taken medication for 1 week, no changes recommended at this time. -Continued amlodipine 10 mg daily, carvedilol 25 mg BID, valsartan/HCTZ 320/25 mg  Written patient instructions provided.  Total time in face to face counseling 30 minutes.   Follow up Pharmacist Clinic Visit in 1 month.     Mitzie Na, PharmD PGY-1 Community Resident [facility]

## 2020-10-10 ENCOUNTER — Encounter: Payer: Self-pay | Admitting: Physician Assistant

## 2020-10-10 ENCOUNTER — Other Ambulatory Visit: Payer: Self-pay

## 2020-10-10 ENCOUNTER — Encounter (HOSPITAL_COMMUNITY): Payer: Self-pay

## 2020-10-10 ENCOUNTER — Ambulatory Visit: Payer: Self-pay | Attending: Critical Care Medicine | Admitting: Physician Assistant

## 2020-10-10 VITALS — BP 113/73 | HR 73 | Ht 59.0 in | Wt 101.0 lb

## 2020-10-10 DIAGNOSIS — I10 Essential (primary) hypertension: Secondary | ICD-10-CM

## 2020-10-10 DIAGNOSIS — I6930 Unspecified sequelae of cerebral infarction: Secondary | ICD-10-CM

## 2020-10-10 DIAGNOSIS — Z789 Other specified health status: Secondary | ICD-10-CM

## 2020-10-10 DIAGNOSIS — M7122 Synovial cyst of popliteal space [Baker], left knee: Secondary | ICD-10-CM

## 2020-10-10 DIAGNOSIS — M79605 Pain in left leg: Secondary | ICD-10-CM

## 2020-10-10 DIAGNOSIS — E1165 Type 2 diabetes mellitus with hyperglycemia: Secondary | ICD-10-CM

## 2020-10-10 LAB — GLUCOSE, POCT (MANUAL RESULT ENTRY): POC Glucose: 95 mg/dl (ref 70–99)

## 2020-10-10 MED ORDER — GABAPENTIN 300 MG PO CAPS
300.0000 mg | ORAL_CAPSULE | Freq: Every day | ORAL | 0 refills | Status: DC
  Filled 2020-10-10: qty 30, 30d supply, fill #0
  Filled 2020-12-16: qty 30, 30d supply, fill #1

## 2020-10-10 NOTE — Progress Notes (Signed)
Anthony Huber, is a 60 y.o. male  HER:[id number]  EHU:[id number]  DOB - 29-Oct-1960  No chief complaint on file.      Subjective:   Anthony Huber is a 60 y.o. male here today for a BP and blood sugar check.  He is also having pain in the back of his L knee for about 6 weeks that is worse at night.  He says it feels swollen.    Patient has No headache, No chest pain, No abdominal pain - No Nausea, No new weakness tingling or numbness, No Cough - SOB.  No problems updated.  ALLERGIES: No Known Allergies  PAST MEDICAL HISTORY: Past Medical History:  Diagnosis Date   Coronary artery disease    Diabetes mellitus without complication (HCC)    Hypertension     MEDICATIONS AT HOME: Prior to Admission medications   Medication Sig Start Date End Date Taking? Authorizing Provider  acetaminophen (TYLENOL) 325 MG tablet Take 2 tablets (650 mg total) by mouth every 4 (four) hours as needed for mild pain (or temp > 37.5 C (99.5 F)). 05/16/20  Yes Angiulli, Mcarthur Rossetti, PA-C  amLODipine (NORVASC) 10 MG tablet Take 1 tablet (10 mg total) by mouth daily. 08/26/20  Yes Storm Frisk, MD  atorvastatin (LIPITOR) 40 MG tablet Take 1 tablet (40 mg total) by mouth daily. 08/26/20 08/26/21 Yes Storm Frisk, MD  carvedilol (COREG) 25 MG tablet Take 1 tablet (25 mg total) by mouth 2 (two) times daily with a meal. 08/26/20  Yes Storm Frisk, MD  colchicine 0.6 MG tablet Take 1 tablet (0.6 mg total) by mouth daily. 06/26/20  Yes Anders Simmonds, PA-C  gabapentin (NEURONTIN) 300 MG capsule Take 1 capsule (300 mg total) by mouth at bedtime. 10/10/20  Yes Georgian Co M, PA-C  metFORMIN (GLUCOPHAGE) 500 MG tablet Take 1 tablet (500 mg total) by mouth daily with breakfast. 08/26/20  Yes Storm Frisk, MD  ferrous sulfate 325 (65 FE) MG tablet Take 1 tablet (325 mg total) by mouth daily. Patient not taking: Reported on 10/10/2020 08/26/20   Storm Frisk, MD  Multiple Vitamin (MULTIVITAMIN WITH  MINERALS) TABS tablet Take 1 tablet by mouth daily. Patient not taking: No sig reported 05/16/20   Angiulli, Mcarthur Rossetti, PA-C  valsartan-hydrochlorothiazide (DIOVAN-HCT) 320-25 MG tablet Take 1 tablet by mouth daily. 08/26/20   Storm Frisk, MD    ROS: Neg HEENT Neg resp Neg cardiac Neg GI Neg GU Neg psych Neg neuro  Objective:   Vitals:   10/10/20 0955  BP: 113/73  Pulse: 73  SpO2: 99%  Weight: 101 lb (45.8 kg)  Height: 4\' 11"  (1.499 m)   Exam General appearance : Awake, alert, not in any distress. Speech Clear. Not toxic looking; appears older than stated age.  Wife and interpreter are with him.  He has been using a cane since his stroke.   HEENT: Atraumatic and Normocephalic Neck: Supple, no JVD. No cervical lymphadenopathy.  Chest: Good air entry bilaterally, CTAB.  No rales/rhonchi/wheezing CVS: S1 S2 regular, no murmurs.  L vs R leg=pulse=B.  No difference in calf size.  No erythema or swelling.  Neg Homan's B.  There is fullness in the L popliteal area ? Baker's cyst Extremities: B/L Lower Ext shows no edema, both legs are warm to touch Neurology: Awake alert, and oriented X 3, CN II-XII intact, Non focal Skin: No Rash  Data Review Lab Results  Component Value Date   HGBA1C  6.2 (A) 10/03/2020   HGBA1C 5.8 (H) 04/06/2020   HGBA1C 6.3 (H) 09/15/2016    Assessment & Plan   1. Controlled type 2 diabetes mellitus with hyperglycemia, without long-term current use of insulin (HCC) improving - Glucose (CBG)  2. Popliteal cyst, left - VAS [place] LOWER EXTREMITY VENOUS (DVT); Future - gabapentin (NEURONTIN) 300 MG capsule; Take 1 capsule (300 mg total) by mouth at bedtime.  Dispense: 90 capsule; Refill: 0  3. Left leg pain - VAS [place] LOWER EXTREMITY VENOUS (DVT); Future - gabapentin (NEURONTIN) 300 MG capsule; Take 1 capsule (300 mg total) by mouth at bedtime.  Dispense: 90 capsule; Refill: 0  4. Essential hypertension Controlled-continue current regimen  5.  History of stroke with residual deficit  In person interpreter  provided and additional time performing visit was required.   Patient have been counseled extensively about nutrition and exercise. Other issues discussed during this visit include: low cholesterol diet, weight control and daily exercise, foot care, annual eye examinations at Ophthalmology, importance of adherence with medications and regular follow-up. We also discussed long term complications of uncontrolled diabetes and hypertension.   Return in about 3 months (around 01/10/2021) for Dr Delford Field for BP/prediabetes.  The patient was given clear instructions to go to ER or return to medical center if symptoms don't improve, worsen or new problems develop. The patient verbalized understanding. The patient was told to call to get lab results if they haven't heard anything in the next week.      Georgian Co, PA-C [facility] [place], [place] [phone]   10/10/2020, 10:23 AM

## 2020-10-17 ENCOUNTER — Ambulatory Visit (HOSPITAL_COMMUNITY)
Admission: RE | Admit: 2020-10-17 | Discharge: 2020-10-17 | Disposition: A | Discharge status: Home / Self Care | Payer: Self-pay | Source: Ambulatory Visit | Attending: Physician Assistant | Admitting: Physician Assistant

## 2020-10-17 ENCOUNTER — Other Ambulatory Visit: Payer: Self-pay

## 2020-10-17 ENCOUNTER — Telehealth: Payer: Self-pay | Admitting: *Deleted

## 2020-10-17 DIAGNOSIS — M7122 Synovial cyst of popliteal space [Baker], left knee: Secondary | ICD-10-CM

## 2020-10-17 DIAGNOSIS — M79605 Pain in left leg: Secondary | ICD-10-CM

## 2020-10-17 NOTE — Telephone Encounter (Signed)
Result call:  Janann August Calling in preliminary report- negative DVT left leg. Report should be released to patient chart soon- and patient has been released to go home.

## 2020-10-25 ENCOUNTER — Encounter: Payer: Self-pay | Admitting: *Deleted

## 2020-10-31 ENCOUNTER — Ambulatory Visit: Payer: Self-pay | Attending: Family Medicine | Admitting: Pharmacist

## 2020-10-31 ENCOUNTER — Other Ambulatory Visit: Payer: Self-pay

## 2020-10-31 ENCOUNTER — Encounter: Payer: Self-pay | Admitting: Pharmacist

## 2020-10-31 VITALS — BP 111/69 | HR 71

## 2020-10-31 DIAGNOSIS — M1A0610 Idiopathic chronic gout, right knee, without tophus (tophi): Secondary | ICD-10-CM

## 2020-10-31 DIAGNOSIS — E1165 Type 2 diabetes mellitus with hyperglycemia: Secondary | ICD-10-CM

## 2020-10-31 NOTE — Progress Notes (Signed)
    S:    PCP: Dr. Delford Field   No chief complaint on file.  Patient arrives in good spirits accompanied by his wife and interpreter, Loistine Chance.  Presents for diabetes and hypertension evaluation, education, and management. Patient was referred and last seen by Primary Care Provider on 08/26/2020. I saw him on 10/03/2020. His A1c was 6.2% at that visit. His BP was also improving.   Family/Social History:  Fhx: no pertinent positives  Tobacco: former smoker  Alcohol: none reported   Insurance coverage/medication affordability: self pay  Medication adherence reported.   Current diabetes medications include: metformin 500 mg daily Current hypertension medications include: amlodipine 10 mg daily, carvedilol 25 mg BID, valsartan/HCTZ 320/25 daily Current hyperlipidemia medications include: atorvastatin 40 mg   Patient reported dietary habits: pt reports adherence to a diabetic diet  Patient-reported exercise habits: none to report, patient has trouble walking s/p ICH   Patient reports nocturia (nighttime urination).  Patient denies neuropathy (nerve pain). Patient reports visual changes. Patient reports self foot exams.     O:  Lab Results  Component Value Date   HGBA1C 6.2 (A) 10/03/2020   Vitals:   10/31/20 1704  BP: 111/69  Pulse: 71   Lipid Panel     Component Value Date/Time   CHOL 173 04/07/2020 1644   TRIG 130 04/11/2020 0418   HDL 36 (L) 04/07/2020 1644   CHOLHDL 4.8 04/07/2020 1644   VLDL 48 (H) 04/07/2020 1644   LDLCALC 89 04/07/2020 1644    Clinical Atherosclerotic Cardiovascular Disease (ASCVD): Yes  The 10-year ASCVD risk score (Arnett DK, et al., 2019) is: 17.4%   Values used to calculate the score:     Age: 60 years     Sex: Male     Is Non-Hispanic African American: No     Diabetic: Yes     Tobacco smoker: No     Systolic Blood Pressure: 111 mmHg     Is BP treated: Yes     HDL Cholesterol: 36 mg/dL     Total Cholesterol: 173 mg/dL    A/P: Diabetes  longstanding currently well controlled. Medication adherence is appropriate. POCT A1c suggests control is optimal.  -Continued metformin 500 mg daily -Counseled patient on adherence and how to get refills through the [facility] pharmacy -Next A1C anticipated 01/2021.   ASCVD risk - secondary prevention in patient with diabetes. Last LDL is not controlled. Ideally LDL goal would be <70. ASCVD present high intensity statin indicated. Aspirin is not indicated.  -Continued atorvastatin 40 mg daily   Hypertension longstanding currently at goal.  Blood pressure goal = <130/80 mmHg. Medication adherence appears appropriate. However, pt has a hx of gout and is on a thiazide. Checking a UA today. We may have to change valsartan-HCTZ to valsartan alone.  -Continued amlodipine 10 mg daily -carvedilol 25 mg BID -valsartan/HCTZ 320/25 mg -Uric acid   Written patient instructions provided.  Total time in face to face counseling 30 minutes.   Follow up Pharmacist Clinic Visit in 1 month.     Butch Penny, PharmD, [place], CPP Clinical Pharmacist [facility] [phone]

## 2020-11-01 ENCOUNTER — Other Ambulatory Visit: Payer: Self-pay | Admitting: Critical Care Medicine

## 2020-11-01 ENCOUNTER — Other Ambulatory Visit: Payer: Self-pay

## 2020-11-01 LAB — URIC ACID: Uric Acid: 10.8 mg/dL — ABNORMAL HIGH (ref 3.8–8.4)

## 2020-11-01 LAB — LIPID PANEL
Chol/HDL Ratio: 3.7 ratio (ref 0.0–5.0)
Cholesterol, Total: 106 mg/dL (ref 100–199)
HDL: 29 mg/dL — ABNORMAL LOW (ref >39)
LDL Chol Calc (NIH): 52 mg/dL (ref 0–99)
Triglycerides: 144 mg/dL (ref 0–149)
VLDL Cholesterol Cal: 25 mg/dL (ref 5–40)

## 2020-11-01 MED ORDER — ALLOPURINOL 300 MG PO TABS
300.0000 mg | ORAL_TABLET | Freq: Every day | ORAL | 6 refills | Status: DC
  Filled 2020-11-01: qty 30, 30d supply, fill #0

## 2020-11-04 ENCOUNTER — Telehealth: Payer: Self-pay | Admitting: Critical Care Medicine

## 2020-11-04 ENCOUNTER — Other Ambulatory Visit: Payer: Self-pay

## 2020-11-04 NOTE — Telephone Encounter (Signed)
Contacted pt son and he is aware of results and DOB was confirmed

## 2020-11-04 NOTE — Telephone Encounter (Signed)
-----   Message from Storm Frisk, MD sent at 11/01/2020  8:58 AM EDT -----  ----- Message ----- From: Storm Frisk, MD Sent: 11/01/2020   6:00 AM EDT To: Dalene Carrow  Let pt know cholesterol normal stay on atorvastatin, uric acid very high, so sent Rx allopurinol to our pharmacy

## 2020-11-07 ENCOUNTER — Telehealth: Payer: Self-pay

## 2020-11-07 NOTE — Telephone Encounter (Signed)
-----   Message from Storm Frisk, MD sent at 11/01/2020  8:58 AM EDT -----  ----- Message ----- From: Storm Frisk, MD Sent: 11/01/2020   6:00 AM EDT To: Dalene Carrow  Let pt know cholesterol normal stay on atorvastatin, uric acid very high, so sent Rx allopurinol to our pharmacy

## 2020-11-07 NOTE — Telephone Encounter (Signed)
I called pt son and he is aware of results.DOB was confirmed.

## 2020-11-11 ENCOUNTER — Ambulatory Visit: Payer: Self-pay

## 2020-11-18 ENCOUNTER — Ambulatory Visit: Payer: Self-pay | Attending: Critical Care Medicine

## 2020-11-18 ENCOUNTER — Other Ambulatory Visit: Payer: Self-pay

## 2020-11-22 ENCOUNTER — Telehealth: Payer: Self-pay | Admitting: Critical Care Medicine

## 2020-11-22 NOTE — Telephone Encounter (Signed)
I will put it on your desk Monday .

## 2020-11-22 NOTE — Telephone Encounter (Signed)
Case worker Margurite Auerbach for Pt dropped off Medication Certification for Disability to be filled out by PCP. Advised 7-10 bus days . Put in Dr. Delford Field mailbox

## 2020-11-24 NOTE — Progress Notes (Incomplete)
Established Patient Office Visit  Subjective:  Patient ID: Anthony Huber, male    DOB: [birth year]  Age: 60 y.o. MRN: [id number]  CC: No chief complaint on file.   HPI Katie Acebo presents for *** Foot flu hcv Past Medical History:  Diagnosis Date   Coronary artery disease    Diabetes mellitus without complication ([place])    Hypertension     Past Surgical History:  Procedure Laterality Date   BACK SURGERY     BLADDER SURGERY     CORONARY ANGIOPLASTY WITH STENT PLACEMENT     CYSTOSCOPY WITH STENT PLACEMENT Left 09/05/2016   Procedure: CYSTOSCOPY, URETEROSCOPY;  Surgeon: Ardis Hughs, MD;  Location: [facility];  Service: Urology;  Laterality: Left;   IR NEPHROSTOMY PLACEMENT LEFT  09/06/2016   NEPHROLITHOTOMY Left 09/16/2016   Procedure: NEPHROLITHOTOMY PERCUTANEOUS  LEFT;  Surgeon: Ardis Hughs, MD;  Location: [facility];  Service: Urology;  Laterality: Left;    No family history on file.  Social History   Socioeconomic History   Marital status: Married    Spouse name: Not on file   Number of children: Not on file   Years of education: Not on file   Highest education level: Not on file  Occupational History   Not on file  Tobacco Use   Smoking status: Former    Types: Cigarettes    Quit date: 2016    Years since quitting: 6.8   Smokeless tobacco: Never  Vaping Use   Vaping Use: Never used  Substance and Sexual Activity   Alcohol use: No   Drug use: No   Sexual activity: Not on file  Other Topics Concern   Not on file  Social History Narrative   Not on file   Social Determinants of Health   Financial Resource Strain: Not on file  Food Insecurity: Not on file  Transportation Needs: Not on file  Physical Activity: Not on file  Stress: Not on file  Social Connections: Not on file  Intimate Partner Violence: Not on file    Outpatient Medications Prior to Visit  Medication Sig Dispense Refill   acetaminophen (TYLENOL) 325 MG tablet  Take 2 tablets (650 mg total) by mouth every 4 (four) hours as needed for mild pain (or temp > 37.5 C (99.5 F)).     allopurinol (ZYLOPRIM) 300 MG tablet Take 1 tablet (300 mg total) by mouth daily. 30 tablet 6   amLODipine (NORVASC) 10 MG tablet Take 1 tablet (10 mg total) by mouth daily. 90 tablet 1   atorvastatin (LIPITOR) 40 MG tablet Take 1 tablet (40 mg total) by mouth daily. 90 tablet 1   carvedilol (COREG) 25 MG tablet Take 1 tablet (25 mg total) by mouth 2 (two) times daily with a meal. 60 tablet 3   colchicine 0.6 MG tablet Take 1 tablet (0.6 mg total) by mouth daily. 30 tablet 4   ferrous sulfate 325 (65 FE) MG tablet Take 1 tablet (325 mg total) by mouth daily. (Patient not taking: Reported on 10/10/2020) 30 tablet 2   gabapentin (NEURONTIN) 300 MG capsule Take 1 capsule (300 mg total) by mouth at bedtime. 90 capsule 0   metFORMIN (GLUCOPHAGE) 500 MG tablet Take 1 tablet (500 mg total) by mouth daily with breakfast. 90 tablet 1   Multiple Vitamin (MULTIVITAMIN WITH MINERALS) TABS tablet Take 1 tablet by mouth daily. (Patient not taking: No sig reported)     valsartan-hydrochlorothiazide (DIOVAN-HCT) 320-25 MG tablet Take 1 tablet  by mouth daily. 90 tablet 3   Facility-Administered Medications Prior to Visit  Medication Dose Route Frequency Provider Last Rate Last Admin   cloNIDine (CATAPRES) tablet 0.1 mg  0.1 mg Oral Once Elsie Stain, MD        No Known Allergies  ROS Review of Systems    Objective:    Physical Exam  There were no vitals taken for this visit. Wt Readings from Last 3 Encounters:  10/10/20 101 lb (45.8 kg)  08/26/20 98 lb 9.6 oz (44.7 kg)  05/12/20 98 lb 1.7 oz (44.5 kg)     Health Maintenance Due  Topic Date Due   COVID-19 Vaccine (1) Never done   Pneumococcal Vaccine 40-53 Years old (1 - PCV) Never done   FOOT EXAM  Never done   OPHTHALMOLOGY EXAM  Never done   Hepatitis C Screening  Never done   TETANUS/TDAP  Never done    COLONOSCOPY (Pts 45-12yr Insurance coverage will need to be confirmed)  Never done   Zoster Vaccines- Shingrix (1 of 2) Never done   INFLUENZA VACCINE  Never done    There are no preventive care reminders to display for this patient.  No results found for: TSH Lab Results  Component Value Date   WBC 7.7 08/26/2020   HGB 11.3 (L) 08/26/2020   HCT 36.6 (L) 08/26/2020   MCV 71 (L) 08/26/2020   PLT 241 08/26/2020   Lab Results  Component Value Date   NA 140 08/26/2020   K 4.1 08/26/2020   CO2 19 (L) 08/26/2020   GLUCOSE 165 (H) 08/26/2020   BUN 16 08/26/2020   CREATININE 1.17 08/26/2020   BILITOT <0.2 06/26/2020   ALKPHOS 145 (H) 06/26/2020   AST 24 06/26/2020   ALT 26 06/26/2020   PROT 7.7 06/26/2020   ALBUMIN 4.1 06/26/2020   CALCIUM 9.2 08/26/2020   ANIONGAP 9 05/13/2020   EGFR 71 08/26/2020   Lab Results  Component Value Date   CHOL 106 10/31/2020   Lab Results  Component Value Date   HDL 29 (L) 10/31/2020   Lab Results  Component Value Date   LDLCALC 52 10/31/2020   Lab Results  Component Value Date   TRIG 144 10/31/2020   Lab Results  Component Value Date   CHOLHDL 3.7 10/31/2020   Lab Results  Component Value Date   HGBA1C 6.2 (A) 10/03/2020      Assessment & Plan:   Problem List Items Addressed This Visit   None   No orders of the defined types were placed in this encounter.   Follow-up: No follow-ups on file.    PAsencion Noble MD

## 2020-11-25 ENCOUNTER — Ambulatory Visit: Payer: Self-pay | Admitting: Critical Care Medicine

## 2020-12-01 NOTE — Progress Notes (Signed)
    S:     PCP: Dr. Delford Field  Patient arrives lethargic.  Presents for diabetes evaluation, education, and management. Patient was last seen by Georgian Co on 10/10/2020. Patient was last seen by CPP on 10/31/2020 where no changes were made.  Patient reports Diabetes is longstanding.   Family/Social History:  -Former smoker  Merchant navy officer affordability: self-pay  Medication adherence reported.  Patient ran out of medications yesterday, but reported not missing any doses thus far.  Current diabetes medications include:  -Metformin 500 mg QD  Current hypertension medications include:  -Carvedilol 25 mg BID -Valsartan-hctz 320-25 mg QD -Amlodipine 10 mg QD  Current hyperlipidemia medications include:  -Atorvastatin 40 mg QD  Patient denies hypoglycemic events.  Patient reported dietary habits: Eats 2-3 meals/day Patient reports rice soup for the majority of meals. Patient does eat occasional vegetables.   Patient-reported exercise habits: none. Patient is applying for Medicaid so that he can receive PT/OT for movement after stroke.   Patient reports nocturia (nighttime urination) 1-2 times per night.  Patient denies neuropathy (nerve pain). Patient denies visual changes. Patient denies self foot exams.     O:    Lab Results  Component Value Date   HGBA1C 6.2 (A) 10/03/2020   There were no vitals filed for this visit.  Lipid Panel     Component Value Date/Time   CHOL 106 10/31/2020 1002   TRIG 144 10/31/2020 1002   HDL 29 (L) 10/31/2020 1002   CHOLHDL 3.7 10/31/2020 1002   CHOLHDL 4.8 04/07/2020 1644   VLDL 48 (H) 04/07/2020 1644   LDLCALC 52 10/31/2020 1002   Patient denies checking blood glucose at home.   BG in clinic is 232 today 2 hours postprandial.  BP in clinic today is 86/62.   Clinical Atherosclerotic Cardiovascular Disease (ASCVD): Yes  The ASCVD Risk score (Arnett DK, et al., 2019) failed to calculate for the following  reasons:   The valid total cholesterol range is 130 to 320 mg/dL    A/P: Diabetes longstanding, currently not at goal. Patient is able to verbalize appropriate hypoglycemia management plan. Medication adherence appears adequate. Control is suboptimal due to diet. -Increase metformin to 500 mg XR BID -Extensively discussed pathophysiology of diabetes, recommended lifestyle interventions, dietary effects on blood sugar control -Counseled on s/sx of and management of hypoglycemia -Next A1C anticipated 01/02/2021.  -Plan for follow-up labs: CMP at next appointment  ASCVD risk - secondary prevention in patient with diabetes. Last LDL is controlled. ASCVD risk score can not be calculated.high intensity statin indicated. Aspirin is not indicated since patient had ICH contributing to stroke. -Continued atorvastatin 40 mg.   Hypertension longstanding, currently with hypotension at baseline.  Blood pressure goal = <130/80 mmHg. Medication adherence optimal.   -Stop HCTZ due to hypotension and recent gout flare.  -Start valsartan 320 mg daily.  -Continue carvedilol and amlodipine at current doses.  -Follow up CMP at next appointment  Written patient instructions provided.  Total time in face to face counseling 33 minutes.   Follow up with pharmacy on 12/16/2020 to evaluate blood glucose response to metformin increase and to check CMP.  Thank you for allowing pharmacy to participate in this patient's care.  Enos Fling, PharmD PGY1 Pharmacy Resident 12/02/2020 12:11 PM  Butch Penny, PharmD, BCACP, CPP Clinical Pharmacist [facility] [phone]

## 2020-12-02 ENCOUNTER — Other Ambulatory Visit: Payer: Self-pay

## 2020-12-02 ENCOUNTER — Ambulatory Visit: Payer: Self-pay | Attending: Family Medicine | Admitting: Pharmacist

## 2020-12-02 DIAGNOSIS — E1165 Type 2 diabetes mellitus with hyperglycemia: Secondary | ICD-10-CM

## 2020-12-02 MED ORDER — VALSARTAN 320 MG PO TABS
320.0000 mg | ORAL_TABLET | Freq: Every day | ORAL | 2 refills | Status: DC
  Filled 2020-12-02: qty 30, 30d supply, fill #0

## 2020-12-02 MED ORDER — METFORMIN HCL ER 500 MG PO TB24
500.0000 mg | ORAL_TABLET | Freq: Two times a day (BID) | ORAL | 2 refills | Status: DC
  Filled 2020-12-02: qty 60, 30d supply, fill #0

## 2020-12-03 LAB — BASIC METABOLIC PANEL
BUN/Creatinine Ratio: 15 (ref 10–24)
BUN: 26 mg/dL (ref 8–27)
CO2: 14 mmol/L — ABNORMAL LOW (ref 20–29)
Calcium: 9 mg/dL (ref 8.6–10.2)
Chloride: 101 mmol/L (ref 96–106)
Creatinine, Ser: 1.72 mg/dL — ABNORMAL HIGH (ref 0.76–1.27)
Glucose: 188 mg/dL — ABNORMAL HIGH (ref 70–99)
Potassium: 5.2 mmol/L (ref 3.5–5.2)
Sodium: 133 mmol/L — ABNORMAL LOW (ref 134–144)
eGFR: 45 mL/min/{1.73_m2} — ABNORMAL LOW (ref >59)

## 2020-12-16 ENCOUNTER — Encounter: Payer: Self-pay | Admitting: Pharmacist

## 2020-12-16 ENCOUNTER — Ambulatory Visit: Payer: Self-pay | Attending: Critical Care Medicine | Admitting: Pharmacist

## 2020-12-16 ENCOUNTER — Other Ambulatory Visit: Payer: Self-pay

## 2020-12-16 VITALS — BP 151/81 | HR 88

## 2020-12-16 DIAGNOSIS — I10 Essential (primary) hypertension: Secondary | ICD-10-CM

## 2020-12-16 DIAGNOSIS — E1165 Type 2 diabetes mellitus with hyperglycemia: Secondary | ICD-10-CM

## 2020-12-16 NOTE — Progress Notes (Signed)
    S:     PCP: Dr. Joya Gaskins  Patient arrives lethargic.  Presents for diabetes evaluation, education, and management. Patient was last seen by Freeman Caldron on 10/10/2020. Patient was last seen by CPP on 12/02/2020. Kidney function had declined. We instructed the patient to hold valsartan and metformin. Plan is to recheck labs today.   Patient reports Diabetes is longstanding.   Family/Social History:  -Former smoker  Human resources officer affordability: self-pay  Medication adherence denied. He has not been taking any medication after being told to hold his valsartan and metformin.   Current diabetes medications include:  -Metformin 500 mg BID (currently holding)  Current hypertension medications include:  -Carvedilol 25 mg BID -Valsartan 320 mg daily (currently holding) -Amlodipine 10 mg daily  Current hyperlipidemia medications include:  -Atorvastatin 40 mg QD  Patient denies hypoglycemic events.  Patient reported dietary habits: Eats 2-3 meals/day Patient reports rice soup for the majority of meals. Patient does eat occasional vegetables.   Patient-reported exercise habits: none. Patient is applying for Medicaid so that he can receive PT/OT for movement after stroke.   Patient reports nocturia (nighttime urination) 1-2 times per night.  Patient denies neuropathy (nerve pain). Patient denies visual changes. Patient denies self foot exams.     O:  Lab Results  Component Value Date   HGBA1C 6.2 (A) 10/03/2020   Vitals:   12/16/20 1031  BP: (!) 151/81  Pulse: 88   Lipid Panel     Component Value Date/Time   CHOL 106 10/31/2020 1002   TRIG 144 10/31/2020 1002   HDL 29 (L) 10/31/2020 1002   CHOLHDL 3.7 10/31/2020 1002   CHOLHDL 4.8 04/07/2020 1644   VLDL 48 (H) 04/07/2020 1644   LDLCALC 52 10/31/2020 1002   Patient denies checking blood glucose at home.   Clinical Atherosclerotic Cardiovascular Disease (ASCVD): Yes  The ASCVD Risk score (Arnett  DK, et al., 2019) failed to calculate for the following reasons:   The valid total cholesterol range is 130 to 320 mg/dL    A/P: Diabetes longstanding, currently at goal. Patient is able to verbalize appropriate hypoglycemia management plan. Medication adherence appears adequate. He is currently holding metformin d/t decline in renal function. Technically, metformin is not contraindicated until eGFR falls below 30. If results show that renal function stabilizes we can consider restarting metformin XR 500 mg daily.  -Hold metformin for now. -Extensively discussed pathophysiology of diabetes, recommended lifestyle interventions, dietary effects on blood sugar control -Counseled on s/sx of and management of hypoglycemia -BMP, A1c  ASCVD risk - secondary prevention in patient with diabetes. Last LDL is controlled. ASCVD risk score can not be calculated. High intensity statin indicated. Aspirin is not indicated since patient had ICH contributing to stroke. -Continued atorvastatin 40 mg.   Hypertension longstanding, currently above goal.  Blood pressure goal = <130/80 mmHg. Medication adherence is suboptimal. When asked to hold valsartan he also stopped carvedilol and amlodipine. Will restart these.    -Resume amlodipine 10 mg daily -Resume carvedilol 25 mg BID  Written patient instructions provided.  Total time in face to face counseling 33 minutes.   Follow up with PCP on 01/09/2020.  Thank you for allowing pharmacy to participate in this patient's care.  Benard Halsted, PharmD, [facility] [place], [place] [phone]

## 2020-12-17 ENCOUNTER — Telehealth: Payer: Self-pay | Admitting: Pharmacist

## 2020-12-17 LAB — BASIC METABOLIC PANEL
BUN/Creatinine Ratio: 15 (ref 10–24)
BUN: 16 mg/dL (ref 8–27)
CO2: 18 mmol/L — ABNORMAL LOW (ref 20–29)
Calcium: 9.2 mg/dL (ref 8.6–10.2)
Chloride: 108 mmol/L — ABNORMAL HIGH (ref 96–106)
Creatinine, Ser: 1.05 mg/dL (ref 0.76–1.27)
Glucose: 58 mg/dL — ABNORMAL LOW (ref 70–99)
Potassium: 4.2 mmol/L (ref 3.5–5.2)
Sodium: 140 mmol/L (ref 134–144)
eGFR: 81 mL/min/{1.73_m2} (ref >59)

## 2020-12-17 LAB — HEMOGLOBIN A1C
Est. average glucose Bld gHb Est-mCnc: 146 mg/dL
Hgb A1c MFr Bld: 6.7 % — ABNORMAL HIGH (ref 4.8–5.6)

## 2020-12-17 NOTE — Telephone Encounter (Signed)
Hey Dr. Delford Field,   This is your patient that we were helping with DM and HTN management. His renal function declined after valsartan-HCTZ was started (Scr 1.72 on 11/28). We stopped this, asked him to hold metformin, and got labs yesterday. His Scr improved to 1.05. We confirmed he was holding valsartan and metformin.   His A1c shows good control but is trending up from 6.2 to 6.7. I can ask him to resume metformin at a lower dose today if you are okay with that. In regards to the valsartan, I think we can hold off on restarting that and continue with amlodipine and carvedilol for now.   Let me know your thoughts and I will call the patient if you're okay with that.

## 2020-12-17 NOTE — Telephone Encounter (Signed)
That sounds like a good plan, I agree, thank you Franky Macho

## 2020-12-18 NOTE — Telephone Encounter (Signed)
Call placed to patient x3 unsuccessful. Left HIPAA-compliant VM requesting a return phone call for follow-up instructions.

## 2021-01-08 ENCOUNTER — Ambulatory Visit: Payer: Self-pay | Admitting: Critical Care Medicine

## 2021-02-06 ENCOUNTER — Telehealth: Payer: Self-pay

## 2021-02-06 ENCOUNTER — Other Ambulatory Visit: Payer: Self-pay

## 2021-02-06 DIAGNOSIS — I610 Nontraumatic intracerebral hemorrhage in hemisphere, subcortical: Secondary | ICD-10-CM

## 2021-02-06 NOTE — Telephone Encounter (Signed)
°  LAST APPOINTMENT DATE:  12/16/2020  NEXT APPOINTMENT DATE:  MEDICATION: amLODipine (NORVASC) 10 MG tablet// atorvastatin (LIPITOR) 40 MG tablet// carvedilol (COREG) 25 MG tablet  Is the patient out of medication? yes  PHARMACY: [facility]

## 2021-02-06 NOTE — Telephone Encounter (Signed)
Fyi  Will call to let him know it will be next due to doctor being out of office

## 2021-02-06 NOTE — Telephone Encounter (Signed)
Patient is requesting a letter for disability that states he suffered a stroke and is unable to work.

## 2021-02-06 NOTE — Telephone Encounter (Signed)
Patient is also needing some medical supplies (diapers, bandage supplies)

## 2021-02-10 ENCOUNTER — Encounter: Payer: Self-pay | Admitting: Critical Care Medicine

## 2021-02-10 ENCOUNTER — Other Ambulatory Visit: Payer: Self-pay

## 2021-02-10 DIAGNOSIS — I610 Nontraumatic intracerebral hemorrhage in hemisphere, subcortical: Secondary | ICD-10-CM

## 2021-02-10 DIAGNOSIS — I10 Essential (primary) hypertension: Secondary | ICD-10-CM

## 2021-02-10 MED ORDER — AMLODIPINE BESYLATE 10 MG PO TABS
10.0000 mg | ORAL_TABLET | Freq: Every day | ORAL | 1 refills | Status: DC
  Filled 2021-02-10: qty 90, 90d supply, fill #0

## 2021-02-10 MED ORDER — ATORVASTATIN CALCIUM 40 MG PO TABS
40.0000 mg | ORAL_TABLET | Freq: Every day | ORAL | 1 refills | Status: DC
  Filled 2021-02-10: qty 90, 90d supply, fill #0

## 2021-02-10 MED ORDER — CARVEDILOL 25 MG PO TABS
25.0000 mg | ORAL_TABLET | Freq: Two times a day (BID) | ORAL | 3 refills | Status: DC
  Filled 2021-02-10: qty 60, 30d supply, fill #0

## 2021-02-10 NOTE — Telephone Encounter (Signed)
Erskine Squibb can you help me figure out what bandages and size diapers this pt needs/

## 2021-02-10 NOTE — Telephone Encounter (Signed)
Letter produced 

## 2021-02-10 NOTE — Telephone Encounter (Signed)
Fyi I refilled medication, not sure how to do the other supply

## 2021-02-10 NOTE — Telephone Encounter (Signed)
Will call pt to see if he wants to pick it up or sent out

## 2021-02-11 NOTE — Telephone Encounter (Signed)
I called Margurite Auerbach  # [phone], the patient's case manager at [facility], who said this message regarding supplies is for a different patient.   Anthony Huber is trying to assist Anthony Huber with completing immigration documentation and he was told  [facility] does not complete immigration paperwork. He has referred the patient to an individual who can assist with documentation completion and is requesting a letter from PCP outlining patient's medical conditions. Anthony Huber said that the letter can be given to the patient at his appointment with Dr Delford Field on 02/17/2021.  Anthony Huber explained that the patient was denied disability from [employer] so they will now help him apply for citizenship.

## 2021-02-12 ENCOUNTER — Encounter: Payer: Self-pay | Admitting: Critical Care Medicine

## 2021-02-12 NOTE — Telephone Encounter (Signed)
noted 

## 2021-02-12 NOTE — Telephone Encounter (Signed)
I have prepared a letter stating his current conditions and I will give a letter to the patient when he comes to see me February 13

## 2021-02-16 NOTE — Progress Notes (Addendum)
Established Patient Office Visit  Subjective:  Patient ID: Anthony Huber, male    DOB: Jun 27, 1960  Age: 61 y.o. 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  Patient is also had nephrolithotomy for renal stones in the left kidney with residual scarring in the left flank.  Patient was last seen in August 2022.  On arrival today blood pressure is good 109/71 and his A1c is 6.4 blood sugar 108.  He appears to run out of his metformin but but is taking his amlodipine and carvedilol with good control for his blood pressure.  He is also on allopurinol and has not required colchicine for gout.  Patient also takes gabapentin for diabetic neuropathy once daily at bedtime.  We did produce a letter for him indicating he no longer can work and gave this to him.  He needs follow-up lab studies at this visit.  He is unable to process the colon cancer screening kit so we will exclude him from this measure.  He does not wish to be vaccinated so we have declined in the system all vaccines.  Patient does complain of left-sided and epigastric pain that is intermittent.  He denies nausea or vomiting.  He has some shortness of breath with exertion its intermittent.  He has right-sided headaches which are chronic ever since he has had the stroke.  He is unable to achieve an eye exam.  He is trying to apply for citizenship and for this will hopefully get access to care.  He does have the orange card and has poor dentition and therefore we will get him in with dental.  Past Medical History:  Diagnosis Date   Coronary artery disease    Diabetes mellitus without complication ([place])    Hypertension     Past Surgical History:  Procedure Laterality Date   BACK SURGERY     BLADDER SURGERY      CORONARY ANGIOPLASTY WITH STENT PLACEMENT     CYSTOSCOPY WITH STENT PLACEMENT Left 09/05/2016   Procedure: CYSTOSCOPY, URETEROSCOPY;  Surgeon: Ardis Hughs, MD;  Location: [facility];  Service: Urology;  Laterality: Left;   IR NEPHROSTOMY PLACEMENT LEFT  09/06/2016   NEPHROLITHOTOMY Left 09/16/2016   Procedure: NEPHROLITHOTOMY PERCUTANEOUS  LEFT;  Surgeon: Ardis Hughs, MD;  Location: [facility];  Service: Urology;  Laterality: Left;    History reviewed. No pertinent family history.  Social History   Socioeconomic History   Marital status: Married    Spouse name: Not on file   Number of children: Not on file   Years of education: Not on file   Highest education level: Not on file  Occupational History   Not on file  Tobacco Use   Smoking status: Former    Types: Cigarettes    Quit date: 2016    Years since quitting: 7.1   Smokeless tobacco: Never  Vaping Use   Vaping Use: Never used  Substance and Sexual Activity   Alcohol use: No   Drug use: No   Sexual activity: Not on file  Other Topics Concern   Not on file  Social History Narrative   Not on file   Social Determinants of Health   Financial Resource Strain: Not on file  Food Insecurity: Not on file  Transportation Needs: Not on file  Physical Activity: Not on file  Stress: Not on file  Social Connections: Not on file  Intimate Partner Violence: Not on file    Outpatient Medications Prior to Visit  Medication Sig Dispense Refill   acetaminophen (TYLENOL) 325 MG tablet Take 2 tablets (650 mg total) by mouth every 4 (four) hours as needed for mild pain (or temp > 37.5 C (99.5 F)).     Multiple Vitamin (MULTIVITAMIN WITH MINERALS) TABS tablet Take 1 tablet by mouth daily.     allopurinol (ZYLOPRIM) 300 MG tablet Take 1 tablet (300 mg total) by mouth daily. 30 tablet 6   amLODipine (NORVASC) 10 MG tablet Take 1 tablet (10 mg total) by mouth daily. 90 tablet 1   atorvastatin (LIPITOR) 40 MG tablet Take 1 tablet (40  mg total) by mouth daily. 90 tablet 1   carvedilol (COREG) 25 MG tablet Take 1 tablet (25 mg total) by mouth 2 (two) times daily with a meal. 60 tablet 3   colchicine 0.6 MG tablet Take 1 tablet (0.6 mg total) by mouth daily. 30 tablet 4   ferrous sulfate 325 (65 FE) MG tablet Take 1 tablet (325 mg total) by mouth daily. 30 tablet 2   gabapentin (NEURONTIN) 300 MG capsule Take 1 capsule (300 mg total) by mouth at bedtime. 90 capsule 0   metFORMIN (GLUCOPHAGE-XR) 500 MG 24 hr tablet Take 1 tablet (500 mg total) by mouth 2 (two) times daily. 60 tablet 2   cloNIDine (CATAPRES) tablet 0.1 mg      No facility-administered medications prior to visit.    No Known Allergies  ROS Review of Systems  Constitutional:  Negative for chills, diaphoresis and fever.  HENT:  Positive for dental problem. Negative for congestion, hearing loss, nosebleeds, sore throat and tinnitus.   Eyes:  Negative for photophobia and redness.  Respiratory:  Positive for shortness of breath. Negative for cough, wheezing and stridor.   Cardiovascular:  Positive for chest pain. Negative for palpitations and leg swelling.  Gastrointestinal:  Positive for abdominal pain. Negative for blood in stool, constipation, diarrhea, nausea and vomiting.  Endocrine: Negative for polydipsia.  Genitourinary:  Negative for dysuria, flank pain, frequency, hematuria and urgency.  Musculoskeletal:  Negative for back pain, myalgias and neck pain.  Skin:  Negative for rash.  Allergic/Immunologic: Negative for environmental allergies.  Neurological:  Positive for headaches. Negative for dizziness, tremors, seizures and weakness.  Hematological:  Does not bruise/bleed easily.  Psychiatric/Behavioral:  Negative for suicidal ideas. The patient is not nervous/anxious.      Objective:    Physical Exam Vitals reviewed.  Constitutional:      Appearance: Normal appearance. He is well-developed. He is not diaphoretic.  HENT:     Head:  Normocephalic and atraumatic.     Nose: No nasal deformity, septal deviation, mucosal edema or rhinorrhea.     Right Sinus: No maxillary sinus tenderness or frontal sinus tenderness.     Left Sinus: No maxillary sinus tenderness or frontal sinus tenderness.     Mouth/Throat:     Dentition: Abnormal dentition. Does not have dentures. Gingival swelling, dental caries and gum lesions present. No dental abscesses.     Pharynx: No oropharyngeal exudate.  Eyes:     General: No scleral icterus.    Conjunctiva/sclera: Conjunctivae normal.     Pupils: Pupils are equal, round, and reactive to light.  Neck:     Thyroid: No thyromegaly.     Vascular: No carotid bruit or JVD.  Trachea: Trachea normal. No tracheal tenderness or tracheal deviation.  Cardiovascular:     Rate and Rhythm: Normal rate and regular rhythm.     Chest Wall: PMI is not displaced.     Pulses: Normal pulses. No decreased pulses.     Heart sounds: Normal heart sounds, S1 normal and S2 normal. Heart sounds not distant. No murmur heard. No systolic murmur is present.  No diastolic murmur is present.    No friction rub. No gallop. No S3 or S4 sounds.  Pulmonary:     Effort: No tachypnea, accessory muscle usage or respiratory distress.     Breath sounds: No stridor. No decreased breath sounds, wheezing, rhonchi or rales.  Chest:     Chest wall: No tenderness.  Abdominal:     General: Bowel sounds are normal. There is no distension.     Palpations: Abdomen is soft. Abdomen is not rigid.     Tenderness: There is no abdominal tenderness. There is no guarding or rebound.  Musculoskeletal:        General: Normal range of motion.     Cervical back: Normal range of motion and neck supple. No edema, erythema or rigidity. No muscular tenderness. Normal range of motion.  Lymphadenopathy:     Head:     Right side of head: No submental or submandibular adenopathy.     Left side of head: No submental or submandibular adenopathy.      Cervical: No cervical adenopathy.  Skin:    General: Skin is warm and dry.     Coloration: Skin is not pale.     Findings: No rash.     Nails: There is no clubbing.  Neurological:     Mental Status: He is alert and oriented to person, place, and time. Mental status is at baseline.     Cranial Nerves: Cranial nerves 2-12 are intact.     Sensory: Sensation is intact. No sensory deficit.     Motor: Weakness present. No tremor or seizure activity.     Gait: Gait abnormal.     Comments: Has left upper extremity left lower extremity paresis  Psychiatric:        Speech: Speech normal.        Behavior: Behavior normal.    BP 109/71    Pulse 71    Resp 16    SpO2 97%  Wt Readings from Last 3 Encounters:  10/10/20 101 lb (45.8 kg)  08/26/20 98 lb 9.6 oz (44.7 kg)  05/12/20 98 lb 1.7 oz (44.5 kg)     There are no preventive care reminders to display for this patient.   There are no preventive care reminders to display for this patient.  No results found for: TSH Lab Results  Component Value Date   WBC 7.7 08/26/2020   HGB 11.3 (L) 08/26/2020   HCT 36.6 (L) 08/26/2020   MCV 71 (L) 08/26/2020   PLT 241 08/26/2020   Lab Results  Component Value Date   NA 140 12/16/2020   K 4.2 12/16/2020   CO2 18 (L) 12/16/2020   GLUCOSE 58 (L) 12/16/2020   BUN 16 12/16/2020   CREATININE 1.05 12/16/2020   BILITOT <0.2 06/26/2020   ALKPHOS 145 (H) 06/26/2020   AST 24 06/26/2020   ALT 26 06/26/2020   PROT 7.7 06/26/2020   ALBUMIN 4.1 06/26/2020   CALCIUM 9.2 12/16/2020   ANIONGAP 9 05/13/2020   EGFR 81 12/16/2020   Lab Results  Component Value Date  CHOL 106 10/31/2020   Lab Results  Component Value Date   HDL 29 (L) 10/31/2020   Lab Results  Component Value Date   LDLCALC 52 10/31/2020   Lab Results  Component Value Date   TRIG 144 10/31/2020   Lab Results  Component Value Date   CHOLHDL 3.7 10/31/2020   Lab Results  Component Value Date   HGBA1C 6.4 02/17/2021       Assessment & Plan:   Problem List Items Addressed This Visit       Cardiovascular and Mediastinum   Essential hypertension    Blood pressure well controlled at this time we will refill amlodipine and carvedilol      Relevant Medications   amLODipine (NORVASC) 10 MG tablet   atorvastatin (LIPITOR) 40 MG tablet   carvedilol (COREG) 25 MG tablet     Endocrine   Controlled type 2 diabetes mellitus with hyperglycemia, without long-term current use of insulin (HCC) - Primary    Controlled type 2 diabetes we will reduce metformin to daily      Relevant Medications   atorvastatin (LIPITOR) 40 MG tablet   metFORMIN (GLUCOPHAGE-XR) 500 MG 24 hr tablet   Other Relevant Orders   POCT glucose (manual entry) (Completed)   POCT glycosylated hemoglobin (Hb A1C) (Completed)   Comprehensive metabolic panel     Nervous and Auditory   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 (HCC)    Patient with persistent flaccid left hemiplegia no longer able to work I issued him a work note        Genitourinary   RESOLVED: AKI (acute kidney injury) ([place])    This had resolved at the last assessment        Other   History of stroke/ICH with residual deficit    We will refill atorvastatin and check lipid panel      Relevant Medications   atorvastatin (LIPITOR) 40 MG tablet   Other Visit Diagnoses     Colon cancer screening       Relevant Orders   Fecal occult blood, imunochemical   Need for hepatitis C screening test       Relevant Orders   HCV Ab w Reflex to Quant PCR   Benign essential HTN       Relevant Medications   amLODipine (NORVASC) 10 MG tablet   atorvastatin (LIPITOR) 40 MG tablet   carvedilol (COREG) 25 MG tablet   Other Relevant Orders   CBC with Differential/Platelet   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    Dispense:  30 tablet    Refill:  6   amLODipine (NORVASC) 10 MG tablet    Sig: Take 1 tablet (10  mg total) by mouth daily.    Dispense:  90 tablet    Refill:  1   atorvastatin (LIPITOR) 40 MG tablet    Sig: Take 1 tablet (40 mg total) by mouth daily.    Dispense:  90 tablet    Refill:  1   carvedilol (COREG) 25 MG tablet    Sig: Take 1 tablet (25 mg total) by mouth 2 (two) times daily with a meal.    Dispense:  60 tablet    Refill:  3   gabapentin (NEURONTIN) 300 MG capsule    Sig: Take 1 capsule (300 mg total) by mouth at bedtime.    Dispense:  90 capsule    Refill:  0   metFORMIN (GLUCOPHAGE-XR) 500 MG 24 hr tablet    Sig: Take 1 tablet (500 mg total) by mouth daily with breakfast.    Dispense:  60 tablet    Refill:  2    Stop regular release metformin    Follow-up: Return in about 4 months (around 06/17/2021).    Asencion Noble, MD

## 2021-02-17 ENCOUNTER — Other Ambulatory Visit: Payer: Self-pay

## 2021-02-17 ENCOUNTER — Encounter: Payer: Self-pay | Admitting: Critical Care Medicine

## 2021-02-17 ENCOUNTER — Ambulatory Visit: Payer: Self-pay | Attending: Critical Care Medicine | Admitting: Critical Care Medicine

## 2021-02-17 VITALS — BP 109/71 | HR 71 | Resp 16

## 2021-02-17 DIAGNOSIS — K029 Dental caries, unspecified: Secondary | ICD-10-CM

## 2021-02-17 DIAGNOSIS — I10 Essential (primary) hypertension: Secondary | ICD-10-CM

## 2021-02-17 DIAGNOSIS — Z1211 Encounter for screening for malignant neoplasm of colon: Secondary | ICD-10-CM

## 2021-02-17 DIAGNOSIS — Z1159 Encounter for screening for other viral diseases: Secondary | ICD-10-CM

## 2021-02-17 DIAGNOSIS — N179 Acute kidney failure, unspecified: Secondary | ICD-10-CM

## 2021-02-17 DIAGNOSIS — E1165 Type 2 diabetes mellitus with hyperglycemia: Secondary | ICD-10-CM

## 2021-02-17 DIAGNOSIS — M7122 Synovial cyst of popliteal space [Baker], left knee: Secondary | ICD-10-CM

## 2021-02-17 DIAGNOSIS — I610 Nontraumatic intracerebral hemorrhage in hemisphere, subcortical: Secondary | ICD-10-CM

## 2021-02-17 DIAGNOSIS — I6930 Unspecified sequelae of cerebral infarction: Secondary | ICD-10-CM

## 2021-02-17 DIAGNOSIS — I69054 Hemiplegia and hemiparesis following nontraumatic subarachnoid hemorrhage affecting left non-dominant side: Secondary | ICD-10-CM

## 2021-02-17 DIAGNOSIS — E782 Mixed hyperlipidemia: Secondary | ICD-10-CM

## 2021-02-17 DIAGNOSIS — M79605 Pain in left leg: Secondary | ICD-10-CM

## 2021-02-17 LAB — POCT GLYCOSYLATED HEMOGLOBIN (HGB A1C): HbA1c, POC (controlled diabetic range): 6.4 % (ref 0.0–7.0)

## 2021-02-17 LAB — GLUCOSE, POCT (MANUAL RESULT ENTRY): POC Glucose: 103 mg/dl — AB (ref 70–99)

## 2021-02-17 MED ORDER — ALLOPURINOL 300 MG PO TABS
300.0000 mg | ORAL_TABLET | Freq: Every day | ORAL | 6 refills | Status: DC
  Filled 2021-02-17: qty 30, 30d supply, fill #0
  Filled 2021-05-05: qty 30, 30d supply, fill #1

## 2021-02-17 MED ORDER — GABAPENTIN 300 MG PO CAPS
300.0000 mg | ORAL_CAPSULE | Freq: Every day | ORAL | 0 refills | Status: DC
  Filled 2021-02-17: qty 30, 30d supply, fill #0
  Filled 2021-05-05: qty 30, 30d supply, fill #1

## 2021-02-17 MED ORDER — CARVEDILOL 25 MG PO TABS
25.0000 mg | ORAL_TABLET | Freq: Two times a day (BID) | ORAL | 3 refills | Status: DC
  Filled 2021-02-17 – 2021-05-05 (×2): qty 60, 30d supply, fill #0

## 2021-02-17 MED ORDER — AMLODIPINE BESYLATE 10 MG PO TABS
10.0000 mg | ORAL_TABLET | Freq: Every day | ORAL | 1 refills | Status: DC
  Filled 2021-02-17 – 2021-05-05 (×2): qty 90, 90d supply, fill #0

## 2021-02-17 MED ORDER — ATORVASTATIN CALCIUM 40 MG PO TABS
40.0000 mg | ORAL_TABLET | Freq: Every day | ORAL | 1 refills | Status: DC
  Filled 2021-02-17: qty 30, 30d supply, fill #0
  Filled 2021-05-05: qty 30, 30d supply, fill #1

## 2021-02-17 MED ORDER — METFORMIN HCL ER 500 MG PO TB24
500.0000 mg | ORAL_TABLET | Freq: Every day | ORAL | 2 refills | Status: DC
  Filled 2021-02-17: qty 30, 30d supply, fill #0
  Filled 2021-05-05: qty 30, 30d supply, fill #1

## 2021-02-17 NOTE — Assessment & Plan Note (Signed)
Very poor dentition associated with diabetes will refer to dental clinic as he has the orange card

## 2021-02-17 NOTE — Assessment & Plan Note (Signed)
Blood pressure well controlled at this time we will refill amlodipine and carvedilol

## 2021-02-17 NOTE — Assessment & Plan Note (Signed)
This had resolved at the last assessment

## 2021-02-17 NOTE — Patient Instructions (Addendum)
Refills on all your medications sent to our pharmacy  Resume metformin but only take 1 pill daily with food  Complete set of screening labs were obtained at this visit  Return to see Dr. Delford Field 4 months  Letter was given to you documenting your inability to work

## 2021-02-17 NOTE — Assessment & Plan Note (Signed)
Patient with persistent flaccid left hemiplegia no longer able to work I issued him a work note

## 2021-02-17 NOTE — Assessment & Plan Note (Signed)
Controlled type 2 diabetes we will reduce metformin to daily

## 2021-02-17 NOTE — Assessment & Plan Note (Signed)
We will refill atorvastatin and check lipid panel

## 2021-02-18 LAB — CBC WITH DIFFERENTIAL/PLATELET
Basophils Absolute: 0.1 10*3/uL (ref 0.0–0.2)
Basos: 0 %
EOS (ABSOLUTE): 1.1 10*3/uL — ABNORMAL HIGH (ref 0.0–0.4)
Eos: 9 %
Hematocrit: 33.8 % — ABNORMAL LOW (ref 37.5–51.0)
Hemoglobin: 10.1 g/dL — ABNORMAL LOW (ref 13.0–17.7)
Immature Grans (Abs): 0 10*3/uL (ref 0.0–0.1)
Immature Granulocytes: 0 %
Lymphocytes Absolute: 1.9 10*3/uL (ref 0.7–3.1)
Lymphs: 16 %
MCH: 21.4 pg — ABNORMAL LOW (ref 26.6–33.0)
MCHC: 29.9 g/dL — ABNORMAL LOW (ref 31.5–35.7)
MCV: 72 fL — ABNORMAL LOW (ref 79–97)
Monocytes Absolute: 1.2 10*3/uL — ABNORMAL HIGH (ref 0.1–0.9)
Monocytes: 10 %
Neutrophils Absolute: 7.9 10*3/uL — ABNORMAL HIGH (ref 1.4–7.0)
Neutrophils: 65 %
Platelets: 530 10*3/uL — ABNORMAL HIGH (ref 150–450)
RBC: 4.71 x10E6/uL (ref 4.14–5.80)
RDW: 18.7 % — ABNORMAL HIGH (ref 11.6–15.4)
WBC: 12.3 10*3/uL — ABNORMAL HIGH (ref 3.4–10.8)

## 2021-02-18 LAB — COMPREHENSIVE METABOLIC PANEL
ALT: 8 IU/L (ref 0–44)
AST: 11 IU/L (ref 0–40)
Albumin/Globulin Ratio: 0.8 — ABNORMAL LOW (ref 1.2–2.2)
Albumin: 3.8 g/dL (ref 3.8–4.8)
Alkaline Phosphatase: 123 IU/L — ABNORMAL HIGH (ref 44–121)
BUN/Creatinine Ratio: 12 (ref 10–24)
BUN: 16 mg/dL (ref 8–27)
Bilirubin Total: 0.3 mg/dL (ref 0.0–1.2)
CO2: 19 mmol/L — ABNORMAL LOW (ref 20–29)
Calcium: 9.5 mg/dL (ref 8.6–10.2)
Chloride: 104 mmol/L (ref 96–106)
Creatinine, Ser: 1.33 mg/dL — ABNORMAL HIGH (ref 0.76–1.27)
Globulin, Total: 4.8 g/dL — ABNORMAL HIGH (ref 1.5–4.5)
Glucose: 94 mg/dL (ref 70–99)
Potassium: 5.3 mmol/L — ABNORMAL HIGH (ref 3.5–5.2)
Sodium: 138 mmol/L (ref 134–144)
Total Protein: 8.6 g/dL — ABNORMAL HIGH (ref 6.0–8.5)
eGFR: 61 mL/min/{1.73_m2} (ref >59)

## 2021-02-18 LAB — LIPID PANEL
Chol/HDL Ratio: 5.2 ratio — ABNORMAL HIGH (ref 0.0–5.0)
Cholesterol, Total: 110 mg/dL (ref 100–199)
HDL: 21 mg/dL — ABNORMAL LOW (ref >39)
LDL Chol Calc (NIH): 71 mg/dL (ref 0–99)
Triglycerides: 89 mg/dL (ref 0–149)
VLDL Cholesterol Cal: 18 mg/dL (ref 5–40)

## 2021-02-18 LAB — HCV AB W REFLEX TO QUANT PCR: HCV Ab: NONREACTIVE

## 2021-02-18 LAB — HCV INTERPRETATION

## 2021-02-19 ENCOUNTER — Telehealth: Payer: Self-pay

## 2021-02-19 ENCOUNTER — Other Ambulatory Visit: Payer: Self-pay

## 2021-02-20 ENCOUNTER — Telehealth: Payer: Self-pay | Admitting: Critical Care Medicine

## 2021-02-20 DIAGNOSIS — Z87442 Personal history of urinary calculi: Secondary | ICD-10-CM

## 2021-02-20 NOTE — Telephone Encounter (Signed)
-----   Message from Storm Frisk, MD sent at 02/18/2021  6:27 AM EST ----- Let pt know through interpreter all labs within normal limits  no medication changes  hep C neg

## 2021-02-20 NOTE — Telephone Encounter (Signed)
Pt was called with interpreter and couldn't leave vm , Information has been sent to nurse pool.

## 2021-02-20 NOTE — Telephone Encounter (Signed)
Rayan is calling from the [facility] [place] [facility] is calling to request a referral for the patient to [facility]. Rayan states that the patient was previously seen at [facility]. Pt receives the [employer] and is needing notification that the patient receives 100%  [employer].  Please advise CB- [phone] Or Hilario Quarry - [phone]

## 2021-02-21 NOTE — Telephone Encounter (Signed)
Urology f/u consult was ordered

## 2021-05-05 ENCOUNTER — Other Ambulatory Visit: Payer: Self-pay

## 2021-05-14 ENCOUNTER — Telehealth: Payer: Self-pay

## 2021-05-14 NOTE — Telephone Encounter (Signed)
Called pt with interpreter line to get an appt for his disability  ?

## 2021-05-14 NOTE — Telephone Encounter (Signed)
Son calling back who speaks [place].  Please call him at [phone]   Antionette Char ? ?I was not sure if you were going to try and work him in sooner than July, b/c that is the first I can schedule. ?

## 2021-05-14 NOTE — Telephone Encounter (Signed)
Called but couldn't leave vm  ?

## 2021-05-19 NOTE — Telephone Encounter (Signed)
Pt son KPA states to call in the mornings is better, he will watch for phone call vm not good [phone] ?

## 2021-05-19 NOTE — Telephone Encounter (Signed)
Noted will call tomorrow morning  ?

## 2021-05-20 ENCOUNTER — Telehealth: Payer: Self-pay

## 2021-05-20 NOTE — Telephone Encounter (Signed)
Again called pt/pt son no answer unable to leave vm  ?

## 2021-05-20 NOTE — Telephone Encounter (Signed)
Called but unable to leave vm, pt needs a appt for disability. On may 25 th he can be double book on any of the morning slots ? ?#rd attempt to contact  ?

## 2021-07-02 ENCOUNTER — Ambulatory Visit: Payer: Self-pay | Attending: Physician Assistant | Admitting: Physician Assistant

## 2021-07-02 ENCOUNTER — Other Ambulatory Visit: Payer: Self-pay

## 2021-07-02 ENCOUNTER — Encounter: Payer: Self-pay | Admitting: Physician Assistant

## 2021-07-02 VITALS — BP 138/78 | HR 66 | Ht 59.0 in | Wt 102.8 lb

## 2021-07-02 DIAGNOSIS — E1165 Type 2 diabetes mellitus with hyperglycemia: Secondary | ICD-10-CM

## 2021-07-02 DIAGNOSIS — M7122 Synovial cyst of popliteal space [Baker], left knee: Secondary | ICD-10-CM

## 2021-07-02 DIAGNOSIS — I10 Essential (primary) hypertension: Secondary | ICD-10-CM

## 2021-07-02 DIAGNOSIS — Z789 Other specified health status: Secondary | ICD-10-CM

## 2021-07-02 DIAGNOSIS — M1A0610 Idiopathic chronic gout, right knee, without tophus (tophi): Secondary | ICD-10-CM

## 2021-07-02 DIAGNOSIS — Z603 Acculturation difficulty: Secondary | ICD-10-CM

## 2021-07-02 DIAGNOSIS — G629 Polyneuropathy, unspecified: Secondary | ICD-10-CM

## 2021-07-02 DIAGNOSIS — I610 Nontraumatic intracerebral hemorrhage in hemisphere, subcortical: Secondary | ICD-10-CM

## 2021-07-02 LAB — POCT GLYCOSYLATED HEMOGLOBIN (HGB A1C): HbA1c, POC (controlled diabetic range): 6 % (ref 0.0–7.0)

## 2021-07-02 LAB — GLUCOSE, POCT (MANUAL RESULT ENTRY): POC Glucose: 94 mg/dl (ref 70–99)

## 2021-07-02 MED ORDER — METFORMIN HCL ER 500 MG PO TB24
500.0000 mg | ORAL_TABLET | Freq: Every day | ORAL | 1 refills | Status: DC
  Filled 2021-07-02: qty 90, 90d supply, fill #0

## 2021-07-02 MED ORDER — ATORVASTATIN CALCIUM 40 MG PO TABS
40.0000 mg | ORAL_TABLET | Freq: Every day | ORAL | 1 refills | Status: DC
  Filled 2021-07-02: qty 90, 90d supply, fill #0

## 2021-07-02 MED ORDER — GABAPENTIN 300 MG PO CAPS
300.0000 mg | ORAL_CAPSULE | Freq: Two times a day (BID) | ORAL | 1 refills | Status: DC
  Filled 2021-07-02: qty 180, 90d supply, fill #0

## 2021-07-02 MED ORDER — CARVEDILOL 25 MG PO TABS
25.0000 mg | ORAL_TABLET | Freq: Two times a day (BID) | ORAL | 1 refills | Status: DC
  Filled 2021-07-02: qty 180, 90d supply, fill #0

## 2021-07-02 MED ORDER — AMLODIPINE BESYLATE 10 MG PO TABS
10.0000 mg | ORAL_TABLET | Freq: Every day | ORAL | 1 refills | Status: DC
  Filled 2021-07-02: qty 90, 90d supply, fill #0

## 2021-07-02 MED ORDER — ALLOPURINOL 300 MG PO TABS
300.0000 mg | ORAL_TABLET | Freq: Every day | ORAL | 1 refills | Status: DC
  Filled 2021-07-02: qty 90, 90d supply, fill #0

## 2021-07-02 NOTE — Patient Instructions (Signed)
I am increaseing your dose of gabapentin to help with foot pain

## 2021-07-02 NOTE — Progress Notes (Signed)
Bottom of feet hurt.

## 2021-07-02 NOTE — Progress Notes (Signed)
Patient ID: Anthony Huber, male   DOB: 01-25-1960, 61 y.o.   MRN: [id number]   Anthony Huber, is a 61 y.o. male  [id number]:[id number]  [id number]  DOB - 1960/05/22  Chief Complaint  Patient presents with   Diabetes       Subjective:   Anthony Huber is a 61 y.o. male here today for a follow up visit.  He continues to have foot pain/paresthesias.  Gabapentin helps but wears off.  He is compliant with meds.  Interpreter in person with patient.  Wife also with patient.    Patient has No headache, No chest pain, No abdominal pain - No Nausea, No new weakness tingling or numbness, No Cough - SOB.   No problems updated.  ALLERGIES: No Known Allergies  PAST MEDICAL HISTORY: Past Medical History:  Diagnosis Date   Coronary artery disease    Diabetes mellitus without complication (HCC)    Hypertension     MEDICATIONS AT HOME: Prior to Admission medications   Medication Sig Start Date End Date Taking? Authorizing Provider  acetaminophen (TYLENOL) 325 MG tablet Take 2 tablets (650 mg total) by mouth every 4 (four) hours as needed for mild pain (or temp > 37.5 C (99.5 F)). 05/16/20  Yes Angiulli, Mcarthur Rossetti, PA-C  Multiple Vitamin (MULTIVITAMIN WITH MINERALS) TABS tablet Take 1 tablet by mouth daily. 05/16/20  Yes Angiulli, Mcarthur Rossetti, PA-C  allopurinol (ZYLOPRIM) 300 MG tablet Take 1 tablet (300 mg total) by mouth daily. 07/02/21   Anders Simmonds, PA-C  amLODipine (NORVASC) 10 MG tablet Take 1 tablet (10 mg total) by mouth daily. 07/02/21   Anders Simmonds, PA-C  atorvastatin (LIPITOR) 40 MG tablet Take 1 tablet (40 mg total) by mouth daily. 07/02/21 07/02/22  Anders Simmonds, PA-C  carvedilol (COREG) 25 MG tablet Take 1 tablet (25 mg total) by mouth 2 (two) times daily with a meal. 07/02/21   Alisha Bacus, Marzella Schlein, PA-C  gabapentin (NEURONTIN) 300 MG capsule Take 1 capsule (300 mg total) by mouth 2 (two) times daily. 07/02/21   Anders Simmonds, PA-C  metFORMIN (GLUCOPHAGE-XR) 500 MG 24 hr tablet Take 1  tablet (500 mg total) by mouth daily with breakfast. 07/02/21   Anders Simmonds, PA-C    Objective:   Vitals:   07/02/21 0848  BP: (!) 147/83  Pulse: 66  SpO2: 99%  Weight: 102 lb 12.8 oz (46.6 kg)  Height: 4\' 11"  (1.499 m)   Exam General appearance : Awake, alert, not in any distress. Speech Clear. Not toxic looking HEENT: Atraumatic and Normocephalic Neck: Supple, no JVD. No cervical lymphadenopathy.  Chest: Good air entry bilaterally, CTAB.  No rales/rhonchi/wheezing CVS: S1 S2 regular, no murmurs.  L hemiplegia Extremities: B/L Lower Ext shows no edema, both legs are warm to touch Neurology: Awake alert, and oriented X 3, CN II-XII intact, Non focal Skin: No Rash  Data Review Lab Results  Component Value Date   HGBA1C 6.4 02/17/2021   HGBA1C 6.7 (H) 12/16/2020   HGBA1C 6.2 (A) 10/03/2020    Assessment & Plan   1. Controlled type 2 diabetes mellitus with hyperglycemia, without long-term current use of insulin (HCC) Controlled.  Continue current regimen - Glucose (CBG) - HgB A1c - CBC with Differential/Platelet - Comprehensive metabolic panel - metFORMIN (GLUCOPHAGE-XR) 500 MG 24 hr tablet; Take 1 tablet (500 mg total) by mouth daily with breakfast.  Dispense: 90 tablet; Refill: 1  2. Benign essential HTN Suboptimal control-no med changes but follow dash diet -  CBC with Differential/Platelet - Comprehensive metabolic panel - amLODipine (NORVASC) 10 MG tablet; Take 1 tablet (10 mg total) by mouth daily.  Dispense: 90 tablet; Refill: 1  3. Language barrier In person interpreters used and additional time performing visit was required.   4. 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  Dispense: 90 tablet; Refill: 1  5. Popliteal cyst, left - gabapentin (NEURONTIN) 300 MG capsule; Take 1 capsule (300 mg total) by mouth 2 (two) times daily.  Dispense: 180  capsule; Refill: 1  6. Chronic gout of right knee, unspecified cause - allopurinol (ZYLOPRIM) 300 MG tablet; Take 1 tablet (300 mg total) by mouth daily.  Dispense: 90 tablet; Refill: 1  7. Peripheral polyneuropathy Increase dose - gabapentin (NEURONTIN) 300 MG capsule; Take 1 capsule (300 mg total) by mouth 2 (two) times daily.  Dispense: 180 capsule; Refill: 1    Return in about 4 months (around 11/01/2021) for with PCP for chronic conditions.  The patient was given clear instructions to go to ER or return to medical center if symptoms don't improve, worsen or new problems develop. The patient verbalized understanding. The patient was told to call to get lab results if they haven't heard anything in the next week.      Georgian Co, PA-C [facility] and [facility] [place], [place] [phone]   07/02/2021, 9:06 AM

## 2021-07-03 LAB — COMPREHENSIVE METABOLIC PANEL
ALT: 7 IU/L (ref 0–44)
AST: 16 IU/L (ref 0–40)
Albumin/Globulin Ratio: 1.1 — ABNORMAL LOW (ref 1.2–2.2)
Albumin: 4.5 g/dL (ref 3.8–4.8)
Alkaline Phosphatase: 113 IU/L (ref 44–121)
BUN/Creatinine Ratio: 14 (ref 10–24)
BUN: 16 mg/dL (ref 8–27)
Bilirubin Total: 0.3 mg/dL (ref 0.0–1.2)
CO2: 21 mmol/L (ref 20–29)
Calcium: 9.9 mg/dL (ref 8.6–10.2)
Chloride: 106 mmol/L (ref 96–106)
Creatinine, Ser: 1.17 mg/dL (ref 0.76–1.27)
Globulin, Total: 4.1 g/dL (ref 1.5–4.5)
Glucose: 88 mg/dL (ref 70–99)
Potassium: 5.1 mmol/L (ref 3.5–5.2)
Sodium: 141 mmol/L (ref 134–144)
Total Protein: 8.6 g/dL — ABNORMAL HIGH (ref 6.0–8.5)
eGFR: 70 mL/min/{1.73_m2} (ref >59)

## 2021-07-03 LAB — CBC WITH DIFFERENTIAL/PLATELET
Basophils Absolute: 0 10*3/uL (ref 0.0–0.2)
Basos: 0 %
EOS (ABSOLUTE): 2.2 10*3/uL — ABNORMAL HIGH (ref 0.0–0.4)
Eos: 24 %
Hematocrit: 39.6 % (ref 37.5–51.0)
Hemoglobin: 12.2 g/dL — ABNORMAL LOW (ref 13.0–17.7)
Immature Grans (Abs): 0 10*3/uL (ref 0.0–0.1)
Immature Granulocytes: 0 %
Lymphocytes Absolute: 2.9 10*3/uL (ref 0.7–3.1)
Lymphs: 32 %
MCH: 21.9 pg — ABNORMAL LOW (ref 26.6–33.0)
MCHC: 30.8 g/dL — ABNORMAL LOW (ref 31.5–35.7)
MCV: 71 fL — ABNORMAL LOW (ref 79–97)
Monocytes Absolute: 0.7 10*3/uL (ref 0.1–0.9)
Monocytes: 8 %
Neutrophils Absolute: 3.3 10*3/uL (ref 1.4–7.0)
Neutrophils: 36 %
Platelets: 281 10*3/uL (ref 150–450)
RBC: 5.56 x10E6/uL (ref 4.14–5.80)
RDW: 17.8 % — ABNORMAL HIGH (ref 11.6–15.4)
WBC: 9.1 10*3/uL (ref 3.4–10.8)

## 2021-07-14 ENCOUNTER — Encounter: Payer: Self-pay | Admitting: *Deleted

## 2021-07-24 ENCOUNTER — Encounter: Payer: Self-pay | Admitting: Critical Care Medicine

## 2021-07-24 ENCOUNTER — Other Ambulatory Visit: Payer: Self-pay

## 2021-07-24 ENCOUNTER — Ambulatory Visit: Payer: Self-pay | Attending: Critical Care Medicine | Admitting: Critical Care Medicine

## 2021-07-24 ENCOUNTER — Encounter: Payer: Self-pay | Admitting: *Deleted

## 2021-07-24 VITALS — BP 123/83 | HR 72 | Temp 99.1°F | Resp 12 | Wt 105.0 lb

## 2021-07-24 DIAGNOSIS — K029 Dental caries, unspecified: Secondary | ICD-10-CM

## 2021-07-24 DIAGNOSIS — I610 Nontraumatic intracerebral hemorrhage in hemisphere, subcortical: Secondary | ICD-10-CM

## 2021-07-24 DIAGNOSIS — E1165 Type 2 diabetes mellitus with hyperglycemia: Secondary | ICD-10-CM

## 2021-07-24 DIAGNOSIS — M7122 Synovial cyst of popliteal space [Baker], left knee: Secondary | ICD-10-CM

## 2021-07-24 DIAGNOSIS — I10 Essential (primary) hypertension: Secondary | ICD-10-CM

## 2021-07-24 DIAGNOSIS — G629 Polyneuropathy, unspecified: Secondary | ICD-10-CM

## 2021-07-24 DIAGNOSIS — M1A0610 Idiopathic chronic gout, right knee, without tophus (tophi): Secondary | ICD-10-CM

## 2021-07-24 MED ORDER — ATORVASTATIN CALCIUM 40 MG PO TABS
40.0000 mg | ORAL_TABLET | Freq: Every day | ORAL | 1 refills | Status: DC
  Filled 2021-07-24: qty 90, 90d supply, fill #0

## 2021-07-24 MED ORDER — METFORMIN HCL ER 500 MG PO TB24
500.0000 mg | ORAL_TABLET | Freq: Every day | ORAL | 1 refills | Status: DC
  Filled 2021-07-24: qty 90, 90d supply, fill #0

## 2021-07-24 MED ORDER — CARVEDILOL 25 MG PO TABS
25.0000 mg | ORAL_TABLET | Freq: Two times a day (BID) | ORAL | 1 refills | Status: DC
Start: 2021-07-24 — End: 2021-10-30
  Filled 2021-07-24: qty 180, 90d supply, fill #0

## 2021-07-24 MED ORDER — ALLOPURINOL 300 MG PO TABS
300.0000 mg | ORAL_TABLET | Freq: Every day | ORAL | 1 refills | Status: DC
  Filled 2021-07-24: qty 90, 90d supply, fill #0

## 2021-07-24 MED ORDER — AMLODIPINE BESYLATE 10 MG PO TABS
10.0000 mg | ORAL_TABLET | Freq: Every day | ORAL | 1 refills | Status: DC
  Filled 2021-07-24: qty 30, 30d supply, fill #0

## 2021-07-24 MED ORDER — COLCHICINE 0.6 MG PO TABS
0.6000 mg | ORAL_TABLET | Freq: Every day | ORAL | 2 refills | Status: DC | PRN
  Filled 2021-07-24: qty 30, 30d supply, fill #0

## 2021-07-24 MED ORDER — GABAPENTIN 300 MG PO CAPS
300.0000 mg | ORAL_CAPSULE | Freq: Two times a day (BID) | ORAL | 1 refills | Status: DC
  Filled 2021-07-24: qty 180, 90d supply, fill #0

## 2021-07-24 NOTE — Assessment & Plan Note (Signed)
On daily allopurinol with as needed colchicine Refills for colchicine sent

## 2021-07-24 NOTE — Assessment & Plan Note (Signed)
Continue daily gabapentin

## 2021-07-24 NOTE — Assessment & Plan Note (Signed)
Very poor dentition with multiple caries on exam Patient needs to see dentist He is currently trying to obtain medicaid/disability

## 2021-07-24 NOTE — Patient Instructions (Signed)
Refill on amlodipine and colchicine sent to our pharmacy fill today  Labs: urine for albumin  Another dental referral sent  Work with your case manager to get medicaid  Return Dr Delford Field 4 months

## 2021-07-24 NOTE — Assessment & Plan Note (Signed)
Continue metformin daily Urine microalbumin completed today

## 2021-07-24 NOTE — Assessment & Plan Note (Addendum)
Patient unaware if he is taking his amlodipine daily as it is not in his medication bag he brought, but he has been on this for a long time, will refill today His wife reports his children are able to understand written english and will be able to assist him in medication instructions/compliance, I recommend they take advantage of this

## 2021-07-24 NOTE — Progress Notes (Signed)
Refill colchicine. Has intermittent left foot pain.

## 2021-07-24 NOTE — Progress Notes (Signed)
Established Patient Office Visit  Subjective   Patient ID: Anthony Huber, male    DOB: 01/05/1961  Age: 61 y.o. MRN: [id number]  Chief Complaint: Hypertension   Anthony Huber is a 61 year old male who presents today for follow up. He has a history of previous stroke with residual left sided weakness, hypertension, type 2 diabetes, and gout. He was last seen by our PA Levada Dy and she increased his gabapentin dosage as he was still having left foot and leg pain. He states this has not seemed to help and the only thing that does is the colchicine he takes as needed with severe pain. He is currently using allopurinol daily as well.  His blood pressure today was 123/83, patient does not take his measurements at home. He has been on amlodipine consistently in the past, however it is not in his medicine bag today. Him and his wife are unsure if he still taking it daily.  He does not take his blood sugars at home as well. Denies increased thirst or urination.   He was previously recommended a dental evaluation given his extensive medical history and current dentition. He has not been able to see one yet. His wife reports they do not have the financial means to pay anything out of pocket for this. He is currently in the process of trying to obtain medicaid/disability.   His wife reports that his children can understand written english and may be able to assist him in medication instructions.   History and visit completed via in person interpretter " [name]" for [place] language.  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 ([place]) 02/17/2021   Dental caries 02/17/2021   Controlled type 2 diabetes mellitus with hyperglycemia, without long-term current use of insulin ([place])    Essential hypertension    Slow transit constipation     Chronic gout of right knee    History of stroke/ICH with residual deficit 04/06/2020   H/O nephrolithotomy with removal of calculi 09/16/2016   Past Medical History:  Diagnosis Date   Coronary artery disease    Diabetes mellitus without complication ([place])    Hypertension    Past Surgical History:  Procedure Laterality Date   BACK SURGERY     BLADDER SURGERY     CORONARY ANGIOPLASTY WITH STENT PLACEMENT     CYSTOSCOPY WITH STENT PLACEMENT Left 09/05/2016   Procedure: CYSTOSCOPY, URETEROSCOPY;  Surgeon: Ardis Hughs, MD;  Location: [facility];  Service: Urology;  Laterality: Left;   IR NEPHROSTOMY PLACEMENT LEFT  09/06/2016   NEPHROLITHOTOMY Left 09/16/2016   Procedure: NEPHROLITHOTOMY PERCUTANEOUS  LEFT;  Surgeon: Ardis Hughs, MD;  Location: [facility];  Service: Urology;  Laterality: Left;   Social History   Tobacco Use   Smoking status: Former    Types: Cigarettes    Quit date: 2016    Years since quitting: 7.5   Smokeless tobacco: Never  Vaping Use   Vaping Use: Never used  Substance Use Topics   Alcohol use: No   Drug use: No   History reviewed. No pertinent family history. No Known Allergies    Review of Systems  Constitutional: Negative.   HENT: Negative.    Eyes: Negative.   Respiratory: Negative.    Cardiovascular: Negative.   Gastrointestinal: Negative.  Genitourinary: Negative.   Musculoskeletal:  Positive for back pain.       Left knee and left foot pain  Skin:  Positive for itching.  Neurological: Negative.   Endo/Heme/Allergies: Negative.   Psychiatric/Behavioral: Negative.        Objective:     BP 123/83 (BP Location: Right Arm, Patient Position: Sitting, Cuff Size: Normal)   Pulse 72   Temp 99.1 F (37.3 C) (Oral)   Resp 12   Wt 105 lb (47.6 kg)   SpO2 97%   BMI 21.21 kg/m     Physical Exam Constitutional:      Appearance: Normal appearance.  HENT:     Head: Normocephalic.     Comments: Poor dentition, multiple dental caries  present      Right Ear: Tympanic membrane, ear canal and external ear normal.     Left Ear: Tympanic membrane and ear canal normal.     Nose: Nose normal.     Mouth/Throat:     Mouth: Mucous membranes are moist.     Pharynx: Oropharynx is clear.  Eyes:     Conjunctiva/sclera: Conjunctivae normal.  Cardiovascular:     Rate and Rhythm: Normal rate and regular rhythm.     Pulses: Normal pulses.  Pulmonary:     Effort: Pulmonary effort is normal.     Breath sounds: Normal breath sounds.  Abdominal:     General: Bowel sounds are normal.     Palpations: Abdomen is soft.     Tenderness: There is no abdominal tenderness.  Skin:    General: Skin is warm.  Neurological:     Mental Status: He is alert. Mental status is at baseline.     Motor: Weakness present.     Gait: Gait abnormal.     Comments: Left sided upper extremity and lower extremity paresthesia   Psychiatric:        Mood and Affect: Mood normal.        Behavior: Behavior normal.      No results found for any visits on 07/24/21.     The ASCVD Risk Huber (Arnett DK, et al., 2019) failed to calculate for the following reasons:   The valid total cholesterol range is 130 to 320 mg/dL    Assessment & Plan:   Problem List Items Addressed This Visit       Cardiovascular and Mediastinum   Essential hypertension    Patient unaware if he is taking his amlodipine daily as it is not in his medication bag he brought, but he has been on this for a long time, will refill today His wife reports his children are able to understand written english and will be able to assist him in medication instructions/compliance, I recommend they take advantage of this        Relevant Medications   atorvastatin (LIPITOR) 40 MG tablet   carvedilol (COREG) 25 MG tablet   amLODipine (NORVASC) 10 MG tablet     Digestive   Dental caries    Very poor dentition with multiple caries on exam Patient needs to see dentist He is currently trying  to obtain medicaid/disability         Endocrine   Controlled type 2 diabetes mellitus with hyperglycemia, without long-term current use of insulin (HCC) - Primary    Continue metformin daily Urine microalbumin completed today       Relevant Medications   atorvastatin (LIPITOR) 40 MG tablet   metFORMIN (GLUCOPHAGE-XR) 500 MG 24 hr  tablet   Other Relevant Orders   Microalbumin / creatinine urine ratio   Ambulatory referral to Dentistry     Nervous and Auditory   Peripheral polyneuropathy    Continue daily gabapentin       Relevant Medications   gabapentin (NEURONTIN) 300 MG capsule     Musculoskeletal and Integument   Chronic gout of right knee    On daily allopurinol with as needed colchicine Refills for colchicine sent       Relevant Medications   allopurinol (ZYLOPRIM) 300 MG tablet   colchicine 0.6 MG tablet   Other Visit Diagnoses     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    Shan Levans, MD

## 2021-07-25 LAB — MICROALBUMIN / CREATININE URINE RATIO
Creatinine, Urine: 96.6 mg/dL
Microalb/Creat Ratio: 30 mg/g creat — ABNORMAL HIGH (ref 0–29)
Microalbumin, Urine: 28.5 ug/mL

## 2021-07-27 NOTE — Progress Notes (Signed)
Let pt know urine albumin much improved from one year ago indication of good diabetes control (call pt son who speaks english)

## 2021-07-30 ENCOUNTER — Telehealth: Payer: Self-pay

## 2021-07-30 NOTE — Telephone Encounter (Signed)
-----   Message from Storm Frisk, MD sent at 07/27/2021 12:12 PM EDT ----- Let pt know urine albumin much improved from one year ago indication of good diabetes control (call pt son who speaks english)

## 2021-07-30 NOTE — Telephone Encounter (Signed)
Pt was called and no vm was left due to mailbox not being set up.Information was sent to nurse pool.  

## 2021-10-30 ENCOUNTER — Other Ambulatory Visit: Payer: Self-pay

## 2021-10-30 ENCOUNTER — Encounter: Payer: Self-pay | Admitting: Physician Assistant

## 2021-10-30 ENCOUNTER — Ambulatory Visit: Payer: Self-pay | Admitting: Critical Care Medicine

## 2021-10-30 ENCOUNTER — Ambulatory Visit: Payer: Self-pay | Attending: Critical Care Medicine | Admitting: Physician Assistant

## 2021-10-30 VITALS — BP 165/99 | HR 65 | Wt 106.6 lb

## 2021-10-30 DIAGNOSIS — I610 Nontraumatic intracerebral hemorrhage in hemisphere, subcortical: Secondary | ICD-10-CM

## 2021-10-30 DIAGNOSIS — E1165 Type 2 diabetes mellitus with hyperglycemia: Secondary | ICD-10-CM

## 2021-10-30 DIAGNOSIS — M1A0610 Idiopathic chronic gout, right knee, without tophus (tophi): Secondary | ICD-10-CM

## 2021-10-30 DIAGNOSIS — I10 Essential (primary) hypertension: Secondary | ICD-10-CM

## 2021-10-30 DIAGNOSIS — G629 Polyneuropathy, unspecified: Secondary | ICD-10-CM

## 2021-10-30 DIAGNOSIS — Z789 Other specified health status: Secondary | ICD-10-CM

## 2021-10-30 DIAGNOSIS — M7122 Synovial cyst of popliteal space [Baker], left knee: Secondary | ICD-10-CM

## 2021-10-30 DIAGNOSIS — Z23 Encounter for immunization: Secondary | ICD-10-CM

## 2021-10-30 MED ORDER — ALLOPURINOL 300 MG PO TABS
300.0000 mg | ORAL_TABLET | Freq: Every day | ORAL | 1 refills | Status: DC
  Filled 2021-10-30: qty 90, 90d supply, fill #0
  Filled 2022-01-22: qty 90, 90d supply, fill #1

## 2021-10-30 MED ORDER — ATORVASTATIN CALCIUM 40 MG PO TABS: 40.0000 mg | ORAL_TABLET | Freq: Every day | ORAL | 1 refills | Status: DC

## 2021-10-30 MED ORDER — METFORMIN HCL ER 500 MG PO TB24
500.0000 mg | ORAL_TABLET | Freq: Every day | ORAL | 1 refills | Status: DC
  Filled 2021-10-30: qty 90, 90d supply, fill #0
  Filled 2022-01-22: qty 90, 90d supply, fill #1

## 2021-10-30 MED ORDER — GABAPENTIN 300 MG PO CAPS
300.0000 mg | ORAL_CAPSULE | Freq: Two times a day (BID) | ORAL | 1 refills | Status: DC
  Filled 2021-10-30: qty 180, 90d supply, fill #0
  Filled 2022-01-22: qty 180, 90d supply, fill #1

## 2021-10-30 MED ORDER — ALLOPURINOL 300 MG PO TABS: 300.0000 mg | ORAL_TABLET | Freq: Every day | ORAL | 1 refills | Status: DC

## 2021-10-30 MED ORDER — COLCHICINE 0.6 MG PO TABS
0.6000 mg | ORAL_TABLET | Freq: Every day | ORAL | 0 refills | Status: DC | PRN
  Filled 2021-10-30: qty 30, 30d supply, fill #0
  Filled 2022-01-22: qty 30, 30d supply, fill #1

## 2021-10-30 MED ORDER — HYDROCHLOROTHIAZIDE 25 MG PO TABS: 25.0000 mg | ORAL_TABLET | Freq: Every day | ORAL | 1 refills | Status: DC

## 2021-10-30 MED ORDER — HYDROCHLOROTHIAZIDE 25 MG PO TABS
25.0000 mg | ORAL_TABLET | Freq: Every day | ORAL | 1 refills | Status: DC
  Filled 2021-10-30: qty 90, 90d supply, fill #0
  Filled 2022-01-22: qty 90, 90d supply, fill #1

## 2021-10-30 MED ORDER — GABAPENTIN 300 MG PO CAPS: 300.0000 mg | ORAL_CAPSULE | Freq: Two times a day (BID) | ORAL | 1 refills | Status: DC

## 2021-10-30 MED ORDER — METFORMIN HCL ER 500 MG PO TB24: 500.0000 mg | ORAL_TABLET | Freq: Every day | ORAL | 1 refills | Status: DC

## 2021-10-30 MED ORDER — CARVEDILOL 25 MG PO TABS
25.0000 mg | ORAL_TABLET | Freq: Two times a day (BID) | ORAL | 1 refills | Status: DC
  Filled 2021-10-30: qty 180, 90d supply, fill #0
  Filled 2022-01-22: qty 180, 90d supply, fill #1

## 2021-10-30 MED ORDER — CARVEDILOL 25 MG PO TABS: 25.0000 mg | ORAL_TABLET | Freq: Two times a day (BID) | ORAL | 1 refills | Status: DC

## 2021-10-30 MED ORDER — COLCHICINE 0.6 MG PO TABS: 0.6000 mg | ORAL_TABLET | Freq: Every day | ORAL | 0 refills | Status: DC | PRN

## 2021-10-30 MED ORDER — ATORVASTATIN CALCIUM 40 MG PO TABS
40.0000 mg | ORAL_TABLET | Freq: Every day | ORAL | 1 refills | Status: DC
  Filled 2021-10-30: qty 90, 90d supply, fill #0
  Filled 2022-01-22: qty 90, 90d supply, fill #1

## 2021-10-30 NOTE — Patient Instructions (Signed)
Check blood pressure daily and record and bring to next visit.  Drink plenty of water

## 2021-10-30 NOTE — Progress Notes (Signed)
Patient ID: Anthony Huber, male   DOB: Jul 07, 1960, 61 y.o.   MRN: [id number]   Anthony Huber, is a 61 y.o. male  [id number]  [id number]  DOB - August 20, 1960  Chief Complaint  Patient presents with   Hypertension   Medication Reaction       Subjective:   Anthony Huber is a 61 y.o. male here today for check up and med RF(they don't understand how to call for RF)-I have gone over it with them with interpreters several times.    He stopped taking amlodipine bc he was having foot and leg swelling everytime he took it.  He does have a BP cuff but has does not check BP OOO.  Denies HA/dizziness/CP/SOB  His wife and the [facility] interpreter are in the room.    No problems updated.  ALLERGIES: No Known Allergies  PAST MEDICAL HISTORY: Past Medical History:  Diagnosis Date   Coronary artery disease    Diabetes mellitus without complication ([place])    Hypertension     MEDICATIONS AT HOME: Prior to Admission medications   Medication Sig Start Date End Date Taking? Authorizing Provider  acetaminophen (TYLENOL) 325 MG tablet Take 2 tablets (650 mg total) by mouth every 4 (four) hours as needed for mild pain (or temp > 37.5 C (99.5 F)). 05/16/20  Yes Angiulli, Lavon Paganini, PA-C  hydrochlorothiazide (HYDRODIURIL) 25 MG tablet Take 1 tablet (25 mg total) by mouth daily. 10/30/21  Yes Argentina Donovan, PA-C  Multiple Vitamin (MULTIVITAMIN WITH MINERALS) TABS tablet Take 1 tablet by mouth daily. 05/16/20  Yes Angiulli, Lavon Paganini, PA-C  allopurinol (ZYLOPRIM) 300 MG tablet Take 1 tablet (300 mg total) by mouth daily. 10/30/21   Argentina Donovan, PA-C  atorvastatin (LIPITOR) 40 MG tablet Take 1 tablet (40 mg total) by mouth daily. 10/30/21 10/30/22  Argentina Donovan, PA-C  carvedilol (COREG) 25 MG tablet Take 1 tablet (25 mg total) by mouth 2 (two) times daily with a meal. 10/30/21   Patrice Moates, Dionne Bucy, PA-C  colchicine 0.6 MG tablet Take 1 tablet (0.6 mg total) by mouth daily as needed (gout flare).  10/30/21   Argentina Donovan, PA-C  gabapentin (NEURONTIN) 300 MG capsule Take 1 capsule (300 mg total) by mouth 2 (two) times daily. 10/30/21   Argentina Donovan, PA-C  metFORMIN (GLUCOPHAGE-XR) 500 MG 24 hr tablet Take 1 tablet (500 mg total) by mouth daily with breakfast. 10/30/21   Juanelle Trueheart, Dionne Bucy, PA-C    ROS: Neg HEENT Neg resp Neg cardiac Neg GI Neg GU Neg MS Neg psych Neg neuro  Objective:   Vitals:   10/30/21 1030  BP: (!) 165/99  Pulse: 65  SpO2: 98%  Weight: 106 lb 9.6 oz (48.4 kg)   Exam General appearance : Awake, alert, not in any distress. Speech Clear. Not toxic looking;  appears to be in poor health/older than stated age.  frail HEENT: Atraumatic and Normocephalic Neck: Supple, no JVD. No cervical lymphadenopathy.  Chest: Good air entry bilaterally, CTAB.  No rales/rhonchi/wheezing CVS: S1 S2 regular, no murmurs.  Extremities: B/L Lower Ext shows no edema, both legs are warm to touch Neurology: Awake alert, and oriented X 3, CN II-XII intact, L sided weakness Skin: No Rash  Data Review Lab Results  Component Value Date   HGBA1C 6.0 07/02/2021   HGBA1C 6.4 02/17/2021   HGBA1C 6.7 (H) 12/16/2020    Assessment & Plan   1. Essential hypertension Uncontrolled-add HCTZ.  Check BP daily and  record.   - hydrochlorothiazide (HYDRODIURIL) 25 MG tablet; Take 1 tablet (25 mg total) by mouth daily.  Dispense: 90 tablet; Refill: 1 - carvedilol (COREG) 25 MG tablet; Take 1 tablet (25 mg total) by mouth 2 (two) times daily with a meal.  Dispense: 180 tablet; Refill: 1 - Basic metabolic panel; Future  2. Controlled type 2 diabetes mellitus with hyperglycemia, without long-term current use of insulin (HCC) - metFORMIN (GLUCOPHAGE-XR) 500 MG 24 hr tablet; Take 1 tablet (500 mg total) by mouth daily with breakfast.  Dispense: 90 tablet; Refill: 1 - Hemoglobin A1c; Future  3. Language barrier [facility] in person interpreter used and additional time performing visit was  required.   4. Chronic gout of right knee, unspecified cause - allopurinol (ZYLOPRIM) 300 MG tablet; Take 1 tablet (300 mg total) by mouth daily.  Dispense: 90 tablet; Refill: 1 Cohicine prn sparingly  5. 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  Dispense: 90 tablet; Refill: 1  6. Popliteal cyst, left - gabapentin (NEURONTIN) 300 MG capsule; Take 1 capsule (300 mg total) by mouth 2 (two) times daily.  Dispense: 180 capsule; Refill: 1  7. Peripheral polyneuropathy - gabapentin (NEURONTIN) 300 MG capsule; Take 1 capsule (300 mg total) by mouth 2 (two) times daily.  Dispense: 180 capsule; Refill: 1    Return in about 1 month (around 11/30/2021) for with Franky Macho and Lab appt; see PCP in 4 months.  The patient was given clear instructions to go to ER or return to medical center if symptoms don't improve, worsen or new problems develop. The patient verbalized understanding. The patient was told to call to get lab results if they haven't heard anything in the next week.      Georgian Co, PA-C [facility] [place], [place] [phone]   10/30/2021, 10:58 AM

## 2021-11-20 ENCOUNTER — Ambulatory Visit: Payer: Self-pay | Admitting: Physician Assistant

## 2021-11-20 ENCOUNTER — Ambulatory Visit: Payer: Self-pay | Admitting: Critical Care Medicine

## 2021-12-04 ENCOUNTER — Ambulatory Visit: Payer: Self-pay | Attending: Critical Care Medicine

## 2021-12-04 DIAGNOSIS — E1165 Type 2 diabetes mellitus with hyperglycemia: Secondary | ICD-10-CM

## 2021-12-04 DIAGNOSIS — I10 Essential (primary) hypertension: Secondary | ICD-10-CM

## 2021-12-05 LAB — BASIC METABOLIC PANEL
BUN/Creatinine Ratio: 20 (ref 10–24)
BUN: 27 mg/dL (ref 8–27)
CO2: 18 mmol/L — ABNORMAL LOW (ref 20–29)
Calcium: 9.6 mg/dL (ref 8.6–10.2)
Chloride: 104 mmol/L (ref 96–106)
Creatinine, Ser: 1.34 mg/dL — ABNORMAL HIGH (ref 0.76–1.27)
Glucose: 98 mg/dL (ref 70–99)
Potassium: 4.2 mmol/L (ref 3.5–5.2)
Sodium: 139 mmol/L (ref 134–144)
eGFR: 60 mL/min/{1.73_m2} (ref >59)

## 2021-12-05 LAB — HEMOGLOBIN A1C
Est. average glucose Bld gHb Est-mCnc: 134 mg/dL
Hgb A1c MFr Bld: 6.3 % — ABNORMAL HIGH (ref 4.8–5.6)

## 2021-12-11 ENCOUNTER — Encounter: Payer: Self-pay | Admitting: Pharmacist

## 2021-12-11 ENCOUNTER — Ambulatory Visit: Payer: Self-pay | Attending: Critical Care Medicine | Admitting: Pharmacist

## 2021-12-11 VITALS — BP 145/83 | HR 74

## 2021-12-11 DIAGNOSIS — I10 Essential (primary) hypertension: Secondary | ICD-10-CM

## 2021-12-11 NOTE — Progress Notes (Signed)
S:     No chief complaint on file.  61 y.o. male who presents for hypertension evaluation, education, and management.  PMH is significant for HTN, T2DM, nontraumatic subarachnoid hemorrhage with flaccid hemiplegia of L side, chronic gout (last UA 10.8 on 10/31/20). Patient was referred by Freeman Caldron on 10/30/2021. At that visit, pt endorsed LE swelling with amlodipine. This was changed to HCTZ. Unfortunately, he had stopped the amlodipine on his own and his BP was elevated at that visit.   Today, patient arrives in good spirits and presents with assistance from his wife. Denies dizziness, headache, blurred vision, swelling. No side effects to the HCTZ. No swelling since stopping amlodipine. Continues to be compliant with carvedilol. Of note, he has a hx of gout with last UA of 10.8 in 2022. Denies any flares or pain since adding HCTZ.  Patient reports hypertension is longstanding.   Family/Social history:  Fhx: no pertinent positives Tobacco: former smoker (quit in 2016) Alcohol: none reported   Medication adherence reported. Patient has not taken BP medications today.   Current antihypertensives include: carvedilol 25 mg BID, HCTZ 25 mg daily   Antihypertensives tried in the past include: amlodipine (LE edema)  Reported home BP readings: none  Patient reported dietary habits:  -Uses some salt when cooking.  -Denies excessive intake of caffeine.   Patient-reported exercise habits: none. Pt's mobility is limited since his stroke. Flaccid hemiplegia of his left side  O:  Vitals:   12/11/21 1017  BP: (!) 145/83  Pulse: 74    Last 3 Office BP readings: BP Readings from Last 3 Encounters:  12/11/21 (!) 145/83  10/30/21 (!) 165/99  07/24/21 123/83    BMET    Component Value Date/Time   NA 139 12/04/2021 1014   K 4.2 12/04/2021 1014   CL 104 12/04/2021 1014   CO2 18 (L) 12/04/2021 1014   GLUCOSE 98 12/04/2021 1014   GLUCOSE 141 (H) 05/13/2020 1253   BUN 27  12/04/2021 1014   CREATININE 1.34 (H) 12/04/2021 1014   CALCIUM 9.6 12/04/2021 1014   GFRNONAA >60 05/13/2020 1253   GFRAA 57 (L) 09/17/2016 0531    Renal function: CrCl cannot be calculated (Unknown ideal weight.).  Clinical ASCVD: Yes  The ASCVD Risk score (Arnett DK, et al., 2019) failed to calculate for the following reasons:   The valid total cholesterol range is 130 to 320 mg/dL   A/P: Hypertension longstanding currently above goal on current medications. BP goal < 130/80 mmHg. Medication adherence appears optimal, however, he did not take medications before this appt. Additionally, he has a hx of hyperuricemia and Gout. I don't think the HCTZ is the best choice for him but denies any gout flares or pain since starting. Given his hx of ICH and risk of uncontrolled HTN, I recommend to continue this and carvedilol for now and follow-up with me in 1 month. I told him to take his medications prior to that appt. We will also plan on a uric acid at that visit given his HCTZ use.  -Continued HCTZ 25 mg daily, carvedilol 25 mg BID for now.  -Patient educated on purpose, proper use, and potential adverse effects of HCTZ.  -F/u labs ordered - BMP8+eGFR today. Anticipate uric acid level at follow-up. -Counseled on lifestyle modifications for blood pressure control including reduced dietary sodium, increased exercise, adequate sleep. -Encouraged patient to check BP at home and bring log of readings to next visit. Counseled on proper use of home BP  cuff.    Results reviewed and written information provided.    Written patient instructions provided. Patient verbalized understanding of treatment plan.  Total time in face to face counseling 30 minutes.    Follow-up:  Pharmacist in 1 month.  Benard Halsted, PharmD, [facility] [place], [place] [phone]

## 2021-12-12 LAB — BMP8+EGFR
BUN/Creatinine Ratio: 15 (ref 10–24)
BUN: 23 mg/dL (ref 8–27)
CO2: 23 mmol/L (ref 20–29)
Calcium: 10.2 mg/dL (ref 8.6–10.2)
Chloride: 101 mmol/L (ref 96–106)
Creatinine, Ser: 1.49 mg/dL — ABNORMAL HIGH (ref 0.76–1.27)
Glucose: 79 mg/dL (ref 70–99)
Potassium: 5 mmol/L (ref 3.5–5.2)
Sodium: 140 mmol/L (ref 134–144)
eGFR: 53 mL/min/{1.73_m2} — ABNORMAL LOW (ref >59)

## 2022-01-21 NOTE — Progress Notes (Signed)
S:     PCP: Dr. Joya Gaskins  62 y.o. male who presents for hypertension evaluation, education, and management.  PMH is significant for HTN, T2DM, nontraumatic subarachnoid hemorrhage with flaccid hemiplegia of L side, chronic gout (last UA 10.8 on 10/31/20). Patient was referred by Freeman Caldron on 10/30/2021. At that visit, pt endorsed LE swelling with amlodipine. This was changed to HCTZ. Unfortunately, he had stopped the amlodipine on his own and his BP was elevated at that visit.   At last visit with the clinical pharmacist, he denied any SDE from HCTZ, of note he has a hx of gout. His BP was elevated at that visit, however, he had not taken his BP medications. Patient was instructed to follow up in a month without any medications changes at that time.   Today, patient arrives in good spirits and presents with assistance from his wife and an interpreter.  Denies dizziness, headache, blurred vision, swelling. He denies any symptoms of a gout flare. He reported that it had been "quite a while" since he had a gout flare. His Scr continues to uptrend since November. Patient denies any OTC NSAID use and endorses staying hydrated with regular water intake.   Patient reports hypertension is long-standing  Family/Social history:  Fhx: no pertinent positives Tobacco: former smoker (quit in 2016) Alcohol: none reported  Medication adherence optimal. Patient has taken BP medications today.   Current antihypertensives include: carvedilol 25 mg BID, HCTZ 25 mg daily    Antihypertensives tried in the past include: amlodipine (LE edema)  Reported home BP readings: not regularly checking; reported 2 home SBP readings of 134 mmHg and 119 mmHg.   Patient reported dietary habits:  -Uses some salt when cooking.  -Denies excessive intake of caffeine.    Patient-reported exercise habits: none. Pt's mobility is limited since his stroke. Flaccid hemiplegia of his left side  O:   Vitals:   01/22/22  1033  BP: 127/76    Last 3 Office BP readings: BP Readings from Last 3 Encounters:  12/11/21 (!) 145/83  10/30/21 (!) 165/99  07/24/21 123/83    BMET    Component Value Date/Time   NA 140 12/11/2021 1021   K 5.0 12/11/2021 1021   CL 101 12/11/2021 1021   CO2 23 12/11/2021 1021   GLUCOSE 79 12/11/2021 1021   GLUCOSE 141 (H) 05/13/2020 1253   BUN 23 12/11/2021 1021   CREATININE 1.49 (H) 12/11/2021 1021   CALCIUM 10.2 12/11/2021 1021   GFRNONAA >60 05/13/2020 1253   GFRAA 57 (L) 09/17/2016 0531    Renal function: CrCl cannot be calculated (Patient's most recent lab result is older than the maximum 21 days allowed.).  Clinical ASCVD: Yes  The ASCVD Risk score (Arnett DK, et al., 2019) failed to calculate for the following reasons:   The valid total cholesterol range is 130 to 320 mg/dL   A/P: Hypertension diagnosed currently controlled on current medications. BP goal < 130/80 mmHg. Medication adherence appears appropriate. Given his upward trending Scr, hesitant to initiate RAAS therapy at this time. Because his blood pressure is controlled and he has not experienced a recent gout flare, will continue current HTN therapy. Will obtain labs to verify kidney function improvements and UA levels. If UA is elevated and Scr is improved, may need to consider switching for RAAS therapy. -Continued carvedilol 25mg  BID -Continued HCTZ 25mg  once dialy.  -F/u labs ordered - uric acid and CMP -Counseled on lifestyle modifications for blood pressure control including  reduced dietary sodium, increased exercise, adequate sleep. -Encouraged patient to check BP at home and bring log of readings to next visit. Counseled on proper use of home BP cuff.    Results reviewed and written information provided.    Written patient instructions provided. Patient verbalized understanding of treatment plan.  Total time in face to face counseling 15 minutes.    Follow-up:  PCP clinic visit in February  2024  Maryan Puls, PharmD PGY-1 [facility] Pharmacy Resident

## 2022-01-22 ENCOUNTER — Other Ambulatory Visit: Payer: Self-pay

## 2022-01-22 ENCOUNTER — Ambulatory Visit: Payer: Self-pay | Attending: Critical Care Medicine | Admitting: Pharmacist

## 2022-01-22 VITALS — BP 127/76

## 2022-01-22 DIAGNOSIS — I10 Essential (primary) hypertension: Secondary | ICD-10-CM

## 2022-01-23 LAB — CMP14+EGFR
ALT: 5 IU/L (ref 0–44)
AST: 16 IU/L (ref 0–40)
Albumin/Globulin Ratio: 1 — ABNORMAL LOW (ref 1.2–2.2)
Albumin: 3.8 g/dL — ABNORMAL LOW (ref 3.9–4.9)
Alkaline Phosphatase: 106 IU/L (ref 44–121)
BUN/Creatinine Ratio: 13 (ref 10–24)
BUN: 17 mg/dL (ref 8–27)
Bilirubin Total: 0.4 mg/dL (ref 0.0–1.2)
CO2: 19 mmol/L — ABNORMAL LOW (ref 20–29)
Calcium: 9.5 mg/dL (ref 8.6–10.2)
Chloride: 104 mmol/L (ref 96–106)
Creatinine, Ser: 1.26 mg/dL (ref 0.76–1.27)
Globulin, Total: 3.9 g/dL (ref 1.5–4.5)
Glucose: 81 mg/dL (ref 70–99)
Potassium: 4.5 mmol/L (ref 3.5–5.2)
Sodium: 143 mmol/L (ref 134–144)
Total Protein: 7.7 g/dL (ref 6.0–8.5)
eGFR: 64 mL/min/{1.73_m2} (ref >59)

## 2022-01-23 LAB — URIC ACID: Uric Acid: 3.6 mg/dL — ABNORMAL LOW (ref 3.8–8.4)

## 2022-01-29 IMAGING — DX DG CHEST 1V PORT
1 series · 1 of 1 positions shown · non-contrast
Comparison: None.

CLINICAL DATA: Stroke

EXAM:
PORTABLE CHEST 1 VIEW

[chest ap]
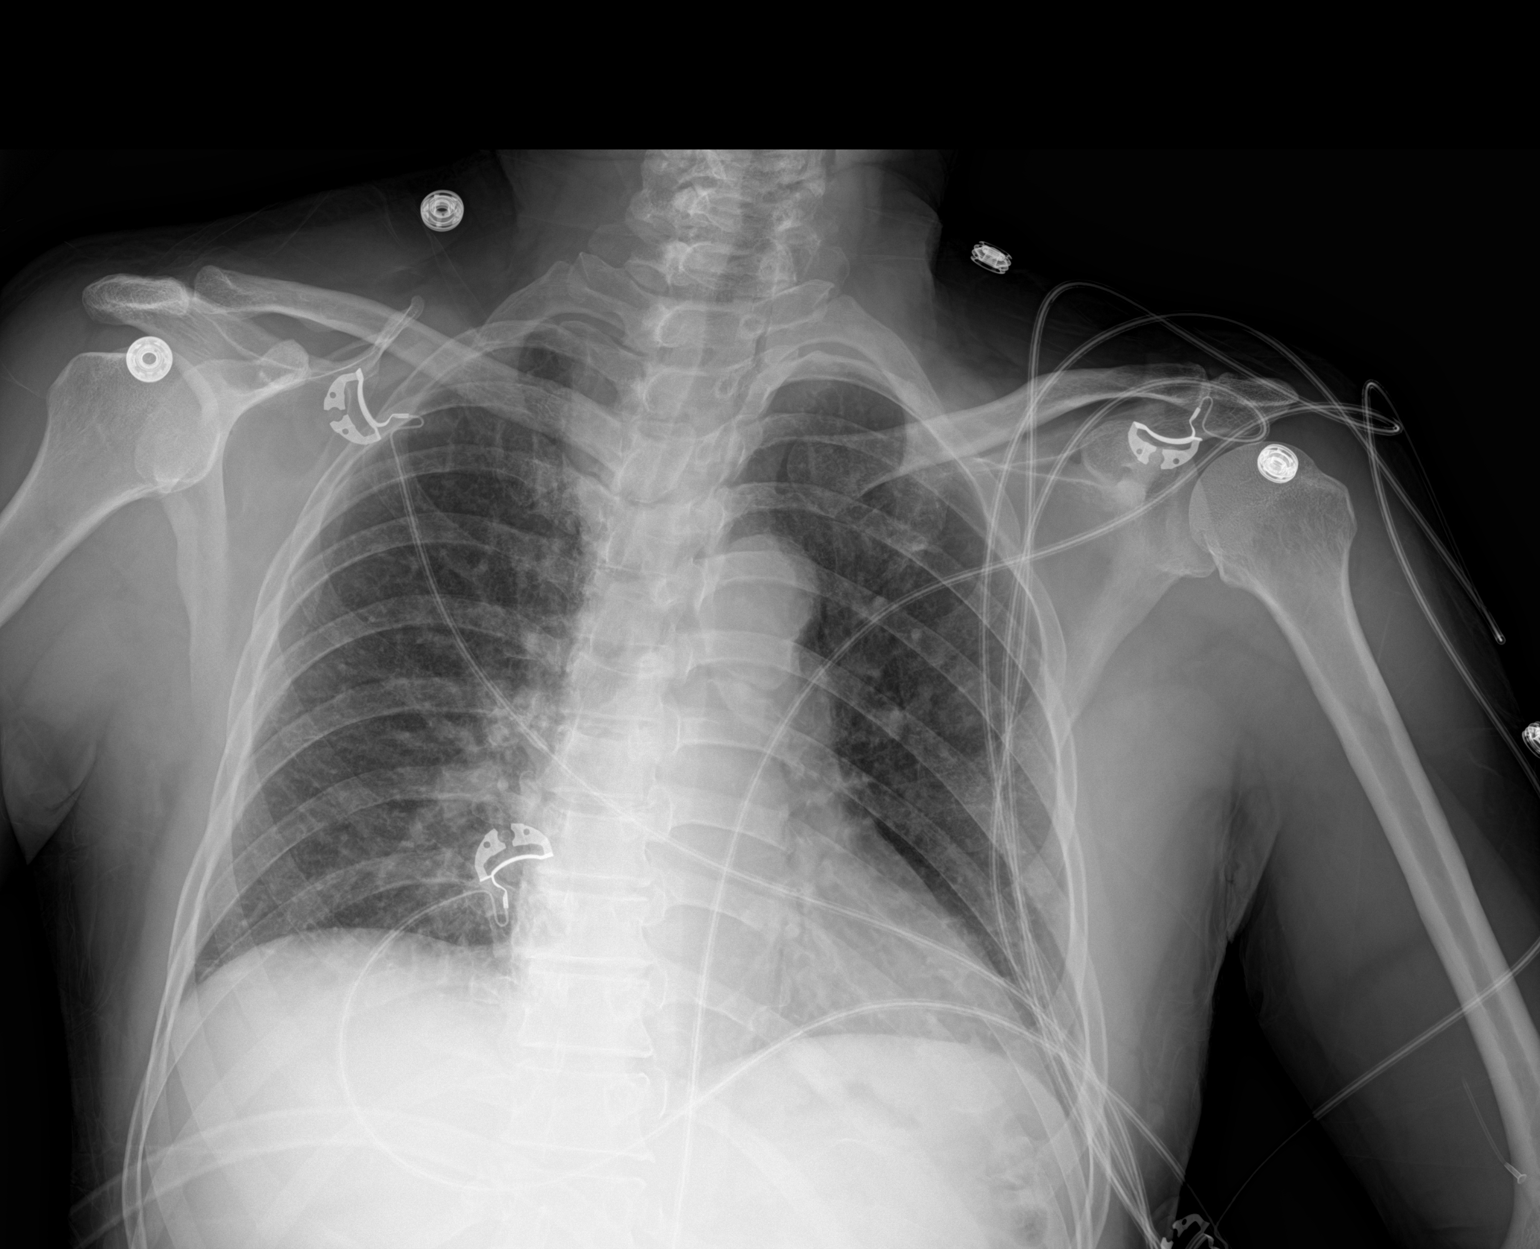

[1 of 1 positions shown; findings below may reference images not displayed]

FINDINGS: The heart size and mediastinal contours are within normal limits.
Prominence of the central pulmonary vasculature is seen. The
visualized skeletal structures are unremarkable.
IMPRESSION: Mild pulmonary vascular congestion.  For

## 2022-01-30 ENCOUNTER — Telehealth: Payer: Self-pay

## 2022-01-30 NOTE — Telephone Encounter (Signed)
Pt son was called and is aware of results, DOB was confirmed. 

## 2022-01-30 NOTE — Telephone Encounter (Signed)
-----  Message from Charlott Rakes, MD sent at 01/23/2022  9:09 AM EST ----- Pls inform him that labs are stable.  Uric acid is down to normal, it was elevated last year.

## 2022-03-05 ENCOUNTER — Ambulatory Visit: Payer: Self-pay | Attending: Critical Care Medicine | Admitting: Critical Care Medicine

## 2022-03-05 ENCOUNTER — Other Ambulatory Visit: Payer: Self-pay

## 2022-03-05 ENCOUNTER — Encounter: Payer: Self-pay | Admitting: Critical Care Medicine

## 2022-03-05 VITALS — BP 128/78 | HR 66 | Ht 59.0 in | Wt 105.0 lb

## 2022-03-05 DIAGNOSIS — I610 Nontraumatic intracerebral hemorrhage in hemisphere, subcortical: Secondary | ICD-10-CM

## 2022-03-05 DIAGNOSIS — E1165 Type 2 diabetes mellitus with hyperglycemia: Secondary | ICD-10-CM

## 2022-03-05 DIAGNOSIS — M1A0610 Idiopathic chronic gout, right knee, without tophus (tophi): Secondary | ICD-10-CM

## 2022-03-05 DIAGNOSIS — M7122 Synovial cyst of popliteal space [Baker], left knee: Secondary | ICD-10-CM

## 2022-03-05 DIAGNOSIS — K029 Dental caries, unspecified: Secondary | ICD-10-CM

## 2022-03-05 DIAGNOSIS — E1169 Type 2 diabetes mellitus with other specified complication: Secondary | ICD-10-CM

## 2022-03-05 DIAGNOSIS — I69054 Hemiplegia and hemiparesis following nontraumatic subarachnoid hemorrhage affecting left non-dominant side: Secondary | ICD-10-CM

## 2022-03-05 DIAGNOSIS — G629 Polyneuropathy, unspecified: Secondary | ICD-10-CM

## 2022-03-05 DIAGNOSIS — E785 Hyperlipidemia, unspecified: Secondary | ICD-10-CM

## 2022-03-05 DIAGNOSIS — I10 Essential (primary) hypertension: Secondary | ICD-10-CM

## 2022-03-05 LAB — POCT GLYCOSYLATED HEMOGLOBIN (HGB A1C): HbA1c, POC (controlled diabetic range): 6.2 % (ref 0.0–7.0)

## 2022-03-05 MED ORDER — METFORMIN HCL ER 500 MG PO TB24
500.0000 mg | ORAL_TABLET | Freq: Every day | ORAL | 1 refills | Status: DC
  Filled 2022-03-05 – 2022-06-15 (×2): qty 90, 90d supply, fill #0
  Filled 2022-11-02: qty 90, 90d supply, fill #1

## 2022-03-05 MED ORDER — ATORVASTATIN CALCIUM 40 MG PO TABS
40.0000 mg | ORAL_TABLET | Freq: Every day | ORAL | 1 refills | Status: DC
  Filled 2022-03-05 – 2022-06-15 (×2): qty 90, 90d supply, fill #0
  Filled 2022-11-02: qty 90, 90d supply, fill #1

## 2022-03-05 MED ORDER — GABAPENTIN 300 MG PO CAPS
300.0000 mg | ORAL_CAPSULE | Freq: Two times a day (BID) | ORAL | 1 refills | Status: DC
  Filled 2022-03-05 – 2022-06-15 (×2): qty 180, 90d supply, fill #0
  Filled 2022-11-02: qty 180, 90d supply, fill #1

## 2022-03-05 MED ORDER — CARVEDILOL 25 MG PO TABS
25.0000 mg | ORAL_TABLET | Freq: Two times a day (BID) | ORAL | 1 refills | Status: DC
  Filled 2022-03-05 – 2022-06-15 (×2): qty 180, 90d supply, fill #0
  Filled 2022-11-02: qty 180, 90d supply, fill #1

## 2022-03-05 MED ORDER — HYDROCHLOROTHIAZIDE 25 MG PO TABS
25.0000 mg | ORAL_TABLET | Freq: Every day | ORAL | 1 refills | Status: DC
  Filled 2022-03-05 – 2022-06-15 (×2): qty 90, 90d supply, fill #0
  Filled 2022-11-02: qty 90, 90d supply, fill #1

## 2022-03-05 MED ORDER — COLCHICINE 0.6 MG PO TABS
0.6000 mg | ORAL_TABLET | Freq: Every day | ORAL | 0 refills | Status: AC | PRN
  Filled 2022-03-05: qty 60, 60d supply, fill #0

## 2022-03-05 MED ORDER — ALLOPURINOL 300 MG PO TABS
300.0000 mg | ORAL_TABLET | Freq: Every day | ORAL | 1 refills | Status: AC
  Filled 2022-03-05 – 2022-06-15 (×2): qty 90, 90d supply, fill #0
  Filled 2022-11-02: qty 90, 90d supply, fill #1

## 2022-03-05 NOTE — Progress Notes (Signed)
Stomach ache over the weekend. Constipation No medication taken.

## 2022-03-05 NOTE — Assessment & Plan Note (Signed)
Continue with gabapentin this is controlled

## 2022-03-05 NOTE — Assessment & Plan Note (Signed)
Blood pressure well-controlled continue the carvedilol 25 mg twice daily hydrochlorothiazide 25 mg daily

## 2022-03-05 NOTE — Assessment & Plan Note (Signed)
Continue atorvastatin check lipids

## 2022-03-05 NOTE — Patient Instructions (Signed)
No change in medication refill sent to our pharmacy  Labs today will be cholesterol panel only  Blood pressure and diabetes very well-controlled   Return to Dr. Joya Gaskins 5 months

## 2022-03-05 NOTE — Progress Notes (Signed)
3  Established Patient Office Visit  Subjective   Patient ID: Anthony Huber, male    DOB: [birth year]  Age: 62 y.o. MRN: [id number]  Chief Complaint: Hypertension   07/2021 [name] is a 62 year old male who presents today for follow up. He has a history of previous stroke with residual left sided weakness, hypertension, type 2 diabetes, and gout. He was last seen by our PA Levada Dy and she increased his gabapentin dosage as he was still having left foot and leg pain. He states this has not seemed to help and the only thing that does is the colchicine he takes as needed with severe pain. He is currently using allopurinol daily as well.  His blood pressure today was 123/83, patient does not take his measurements at home. He has been on amlodipine consistently in the past, however it is not in his medicine bag today. Him and his wife are unsure if he still taking it daily.  He does not take his blood sugars at home as well. Denies increased thirst or urination.   He was previously recommended a dental evaluation given his extensive medical history and current dentition. He has not been able to see one yet. His wife reports they do not have the financial means to pay anything out of pocket for this. He is currently in the process of trying to obtain medicaid/disability.   His wife reports that his children can understand written english and may be able to assist him in medication instructions.   History and visit completed via in person interpretter " [name]" for [place] language.  Pt spouse with pt today  03/05/22 The patient is seen today in follow-up this is assisted in person interpreter Mr. Reynolds Bowl Patient is had a previous stroke involving the left side he is able to ambulate with a cane.  He had an upset stomach over the weekend with constipation is now resolved.  On arrival A1c is 6.2 and blood pressure is well-controlled at 128/78.  He is compliant with all his medications.  He does need  a dental follow-up and does have the orange card.      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 ([place]) 02/17/2021   Dental caries 02/17/2021   Controlled type 2 diabetes mellitus with hyperglycemia, without long-term current use of insulin ([place])    Essential hypertension    Slow transit constipation    Chronic gout of right knee    History of stroke/ICH with residual deficit 04/06/2020   H/O nephrolithotomy with removal of calculi 09/16/2016   Past Medical History:  Diagnosis Date   Coronary artery disease    Diabetes mellitus without complication ([place])    Hypertension    Past Surgical History:  Procedure Laterality Date   BACK SURGERY     BLADDER SURGERY     CORONARY ANGIOPLASTY WITH STENT PLACEMENT     CYSTOSCOPY WITH STENT PLACEMENT Left 09/05/2016   Procedure: CYSTOSCOPY, URETEROSCOPY;  Surgeon: Ardis Hughs, MD;  Location: [facility];  Service: Urology;  Laterality: Left;   IR NEPHROSTOMY PLACEMENT LEFT  09/06/2016   NEPHROLITHOTOMY Left 09/16/2016   Procedure: NEPHROLITHOTOMY PERCUTANEOUS  LEFT;  Surgeon: Ardis Hughs, MD;  Location: [facility];  Service: Urology;  Laterality: Left;   Social History   Tobacco  Use   Smoking status: Former    Types: Cigarettes    Quit date: 2016    Years since quitting: 8.1   Smokeless tobacco: Never  Vaping Use   Vaping Use: Never used  Substance Use Topics   Alcohol use: No   Drug use: No   No family history on file. Allergies  Allergen Reactions   Amlodipine Other (See Comments)    Leg swelling      Review of Systems  Constitutional: Negative.  Negative for chills, diaphoresis, fever, malaise/fatigue and weight loss.  HENT: Negative.  Negative for congestion, hearing loss, nosebleeds, sore throat  and tinnitus.   Eyes: Negative.  Negative for blurred vision, photophobia and redness.  Respiratory: Negative.  Negative for cough, hemoptysis, sputum production, shortness of breath, wheezing and stridor.   Cardiovascular: Negative.  Negative for chest pain, palpitations, orthopnea, claudication, leg swelling and PND.  Gastrointestinal: Negative.  Negative for abdominal pain, blood in stool, constipation, diarrhea, heartburn, nausea and vomiting.  Genitourinary: Negative.  Negative for dysuria, flank pain, frequency, hematuria and urgency.  Musculoskeletal:  Negative for back pain, falls, joint pain, myalgias and neck pain.  Skin:  Negative for itching and rash.  Neurological: Negative.  Negative for dizziness, tingling, tremors, sensory change, speech change, focal weakness, seizures, loss of consciousness, weakness and headaches.  Endo/Heme/Allergies: Negative.  Negative for environmental allergies and polydipsia. Does not bruise/bleed easily.  Psychiatric/Behavioral: Negative.  Negative for depression, memory loss, substance abuse and suicidal ideas. The patient is not nervous/anxious and does not have insomnia.       Objective:     BP 128/78   Pulse 66   Ht '4\' 11"'$  (1.499 m)   Wt 105 lb (47.6 kg)   SpO2 99%   BMI 21.21 kg/m     Physical Exam Constitutional:      Appearance: Normal appearance. He is normal weight.  HENT:     Head: Normocephalic.     Comments: Poor dentition, multiple dental caries present      Right Ear: Tympanic membrane, ear canal and external ear normal.     Left Ear: Tympanic membrane and ear canal normal.     Nose: Nose normal.     Mouth/Throat:     Mouth: Mucous membranes are moist.     Pharynx: Oropharynx is clear.  Eyes:     Conjunctiva/sclera: Conjunctivae normal.  Cardiovascular:     Rate and Rhythm: Normal rate and regular rhythm.     Pulses: Normal pulses.  Pulmonary:     Effort: Pulmonary effort is normal.     Breath sounds: Normal breath  sounds.  Abdominal:     General: Bowel sounds are normal.     Palpations: Abdomen is soft.     Tenderness: There is no abdominal tenderness.  Musculoskeletal:     Comments: Normal foot exam  Skin:    General: Skin is warm.  Neurological:     Mental Status: He is alert. Mental status is at baseline.     Motor: Weakness present.     Gait: Gait abnormal.     Comments: Left sided upper extremity and lower extremity paresthesia   Psychiatric:        Mood and Affect: Mood normal.        Behavior: Behavior normal.      Results for orders placed or performed in visit on 03/05/22  POCT glycosylated hemoglobin (Hb A1C)  Result Value Ref Range   Hemoglobin A1C  HbA1c POC (<> result, manual entry)     HbA1c, POC (prediabetic range)     HbA1c, POC (controlled diabetic range) 6.2 0.0 - 7.0 %       The ASCVD Risk score (Arnett DK, et al., 2019) failed to calculate for the following reasons:   The valid total cholesterol range is 130 to 320 mg/dL    Assessment & Plan:   Problem List Items Addressed This Visit       Cardiovascular and Mediastinum   Essential hypertension    Blood pressure well-controlled continue the carvedilol 25 mg twice daily hydrochlorothiazide 25 mg daily      Relevant Medications   atorvastatin (LIPITOR) 40 MG tablet   carvedilol (COREG) 25 MG tablet   hydrochlorothiazide (HYDRODIURIL) 25 MG tablet     Digestive   Dental caries    Significant dental caries will refer to the [facility] he has the orange card      Relevant Orders   Ambulatory referral to Dentistry     Endocrine   Controlled type 2 diabetes mellitus with hyperglycemia, without long-term current use of insulin (HCC) - Primary    Type 2 diabetes at goal continue with metformin 500 mg daily Foot exam was normal      Relevant Medications   atorvastatin (LIPITOR) 40 MG tablet   metFORMIN (GLUCOPHAGE-XR) 500 MG 24 hr tablet   Other Relevant Orders   POCT glycosylated  hemoglobin (Hb A1C) (Completed)   Lipid panel   Hyperlipidemia associated with type 2 diabetes mellitus (HCC)    Continue atorvastatin check lipids      Relevant Medications   atorvastatin (LIPITOR) 40 MG tablet   carvedilol (COREG) 25 MG tablet   hydrochlorothiazide (HYDRODIURIL) 25 MG tablet   metFORMIN (GLUCOPHAGE-XR) 500 MG 24 hr tablet     Nervous and Auditory   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 (HCC)    Stable at this time monitor      Peripheral polyneuropathy    Continue with gabapentin this is controlled      Relevant Medications   gabapentin (NEURONTIN) 300 MG capsule     Musculoskeletal and Integument   Chronic gout of right knee   Relevant Medications   allopurinol (ZYLOPRIM) 300 MG tablet   colchicine 0.6 MG tablet   Other Visit Diagnoses     Nontraumatic subcortical hemorrhage of left cerebral hemisphere (HCC)       Relevant Medications   atorvastatin (LIPITOR) 40 MG tablet   Popliteal cyst, left       Relevant Medications   gabapentin (NEURONTIN) 300 MG capsule     38 minutes spent extra time needed because of language barrier  Return in about 5 months (around 08/03/2022) for htn, diabetes.    Asencion Noble, MD

## 2022-03-05 NOTE — Assessment & Plan Note (Signed)
Significant dental caries will refer to the [facility] he has the orange card

## 2022-03-05 NOTE — Assessment & Plan Note (Addendum)
Type 2 diabetes at goal continue with metformin 500 mg daily Foot exam was normal

## 2022-03-05 NOTE — Assessment & Plan Note (Signed)
Stable at this time monitor

## 2022-03-06 LAB — LIPID PANEL
Chol/HDL Ratio: 3.9 ratio (ref 0.0–5.0)
Cholesterol, Total: 130 mg/dL (ref 100–199)
HDL: 33 mg/dL — ABNORMAL LOW (ref >39)
LDL Chol Calc (NIH): 78 mg/dL (ref 0–99)
Triglycerides: 102 mg/dL (ref 0–149)
VLDL Cholesterol Cal: 19 mg/dL (ref 5–40)

## 2022-03-06 NOTE — Progress Notes (Signed)
Let pt know cholesterol is normal  no medication changes  you will have to speak to wife and use [place] interpreter   Call Mr Paulla Fore of language resources for conference call   [phone]

## 2022-03-10 ENCOUNTER — Other Ambulatory Visit: Payer: Self-pay

## 2022-03-12 ENCOUNTER — Telehealth: Payer: Self-pay

## 2022-03-12 NOTE — Telephone Encounter (Signed)
-----   Message from Elsie Stain, MD sent at 03/06/2022  8:20 AM EST ----- Let pt know cholesterol is normal  no medication changes  you will have to speak to wife and use [place] interpreter   Call Mr Paulla Fore of language resources for conference call   [phone]

## 2022-03-12 NOTE — Telephone Encounter (Signed)
Pt son was called and is aware of results, DOB was confirmed.

## 2022-06-15 ENCOUNTER — Other Ambulatory Visit: Payer: Self-pay

## 2022-08-03 NOTE — Progress Notes (Unsigned)
3  Established Patient Office Visit  Subjective   Patient ID: Anthony Huber, male    DOB: 1960-12-12  Age: 62 y.o. MRN: [id number]  Chief Complaint: Hypertension   07/2021 Anthony Huber is a 62 year old male who presents today for follow up. He has a history of previous stroke with residual left sided weakness, hypertension, type 2 diabetes, and gout. He was last seen by our PA Anthony Huber and she increased his gabapentin dosage as he was still having left foot and leg pain. He states this has not seemed to help and the only thing that does is the colchicine he takes as needed with severe pain. He is currently using allopurinol daily as well.  His blood pressure today was 123/83, patient does not take his measurements at home. He has been on amlodipine consistently in the past, however it is not in his medicine bag today. Him and his wife are unsure if he still taking it daily.  He does not take his blood sugars at home as well. Denies increased thirst or urination.   He was previously recommended a dental evaluation given his extensive medical history and current dentition. He has not been able to see one yet. His wife reports they do not have the financial means to pay anything out of pocket for this. He is currently in the process of trying to obtain medicaid/disability.   His wife reports that his children can understand written english and may be able to assist him in medication instructions.   History and visit completed via in person interpretter " Anthony Huber" for mon[place] language.  Pt spouse with pt today  03/05/22 The patient is seen today in follow-up this is assisted in person interpreter Anthony Huber Patient is had a previous stroke involving the left side he is able to ambulate with a cane.  He had an upset stomach over the weekend with constipation is now resolved.  On arrival A1c is 6.2 and blood pressure is well-controlled at 128/78.  He is compliant with all his medications.  He does need  a dental follow-up and does have the orange card.     Patient Active Problem List   Diagnosis Date Noted  . Hyperlipidemia associated with type 2 diabetes mellitus (H[facility]) 03/05/2022  . Peripheral polyneuropathy 07/24/2021  . 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 Dental caries 02/17/2021  . Controlled type 2 diabetes mellitus with hyperglycemia, without long-term current use of insulin ([facility])   . Essential hypertension   . Slow transit constipation   . Chronic gout of right knee   . History of stroke/ICH with residual deficit 04/06/2020  . H/O nephrolithotomy with removal of calculi 09/16/2016   Past Medical History:  Diagnosis Date  . Coronary artery disease   . Diabetes mellitus without complication ([facility])   . Hypertension    Past Surgical History:  Procedure Laterality Date  . BACK SURGERY    . BLADDER SURGERY    . CORONARY ANGIOPLASTY WITH STENT PLACEMENT    . CYSTOSCOPY WITH STENT PLACEMENT Left 09/05/2016   Procedure: CYSTOSCOPY, URETEROSCOPY;  Surgeon: Crist Fat, MD;  Location: [facility];  Service: Urology;  Laterality: Left;  . IR NEPHROSTOMY PLACEMENT LEFT  09/06/2016  . NEPHROLITHOTOMY Left 09/16/2016   Procedure: NEPHROLITHOTOMY PERCUTANEOUS  LEFT;  Surgeon: Crist Fat, MD;  Location: [facility];  Service: Urology;  Laterality: Left;   Social History   Tobacco Use  .  Smoking status: Former    Current packs/day: 0.00    Types: Cigarettes    Quit date: 2016    Years since quitting: 8.5  . Smokeless tobacco: Never  Vaping Use  . Vaping status: Never Used  Substance Use Topics  . Alcohol use: No  . Drug use: No   No family history on file. Allergies  Allergen Reactions  . Amlodipine Other (See Comments)    Leg swelling      Review of Systems  Constitutional: Negative.  Negative for chills, diaphoresis, fever, malaise/fatigue and weight loss.  HENT: Negative.  Negative  for congestion, hearing loss, nosebleeds, sore throat and tinnitus.   Eyes: Negative.  Negative for blurred vision, photophobia and redness.  Respiratory: Negative.  Negative for cough, hemoptysis, sputum production, shortness of breath, wheezing and stridor.   Cardiovascular: Negative.  Negative for chest pain, palpitations, orthopnea, claudication, leg swelling and PND.  Gastrointestinal: Negative.  Negative for abdominal pain, blood in stool, constipation, diarrhea, heartburn, nausea and vomiting.  Genitourinary: Negative.  Negative for dysuria, flank pain, frequency, hematuria and urgency.  Musculoskeletal:  Negative for back pain, falls, joint pain, myalgias and neck pain.  Skin:  Negative for itching and rash.  Neurological: Negative.  Negative for dizziness, tingling, tremors, sensory change, speech change, focal weakness, seizures, loss of consciousness, weakness and headaches.  Endo/Heme/Allergies: Negative.  Negative for environmental allergies and polydipsia. Does not bruise/bleed easily.  Psychiatric/Behavioral: Negative.  Negative for depression, memory loss, substance abuse and suicidal ideas. The patient is not nervous/anxious and does not have insomnia.       Objective:     There were no vitals taken for this visit.    Physical Exam Constitutional:      Appearance: Normal appearance. He is normal weight.  HENT:     Head: Normocephalic.     Comments: Poor dentition, multiple dental caries present      Right Ear: Tympanic membrane, ear canal and external ear normal.     Left Ear: Tympanic membrane and ear canal normal.     Nose: Nose normal.     Mouth/Throat:     Mouth: Mucous membranes are moist.     Pharynx: Oropharynx is clear.  Eyes:     Conjunctiva/sclera: Conjunctivae normal.  Cardiovascular:     Rate and Rhythm: Normal rate and regular rhythm.     Pulses: Normal pulses.  Pulmonary:     Effort: Pulmonary effort is normal.     Breath sounds: Normal breath  sounds.  Abdominal:     General: Bowel sounds are normal.     Palpations: Abdomen is soft.     Tenderness: There is no abdominal tenderness.  Musculoskeletal:     Comments: Normal foot exam  Skin:    General: Skin is warm.  Neurological:     Mental Status: He is alert. Mental status is at baseline.     Motor: Weakness present.     Gait: Gait abnormal.     Comments: Left sided upper extremity and lower extremity paresthesia   Psychiatric:        Mood and Affect: Mood normal.        Behavior: Behavior normal.     No results found for any visits on 08/04/22.      The 10-year ASCVD risk score (Arnett DK, et al., 2019) is: 21.6%    Assessment & Plan:   Problem List Items Addressed This Visit   None  38 minutes spent extra time  needed because of language barrier  No follow-ups on file.    Shan Levans, MD

## 2022-08-04 ENCOUNTER — Ambulatory Visit: Payer: Medicaid Other | Attending: Critical Care Medicine | Admitting: Critical Care Medicine

## 2022-08-04 ENCOUNTER — Encounter: Payer: Self-pay | Admitting: Critical Care Medicine

## 2022-08-04 VITALS — BP 122/79 | HR 96 | Wt 108.4 lb

## 2022-08-04 DIAGNOSIS — E119 Type 2 diabetes mellitus without complications: Secondary | ICD-10-CM

## 2022-08-04 DIAGNOSIS — E1169 Type 2 diabetes mellitus with other specified complication: Secondary | ICD-10-CM | POA: Diagnosis not present

## 2022-08-04 DIAGNOSIS — E785 Hyperlipidemia, unspecified: Secondary | ICD-10-CM

## 2022-08-04 DIAGNOSIS — E1165 Type 2 diabetes mellitus with hyperglycemia: Secondary | ICD-10-CM | POA: Diagnosis not present

## 2022-08-04 DIAGNOSIS — Z7984 Long term (current) use of oral hypoglycemic drugs: Secondary | ICD-10-CM

## 2022-08-04 DIAGNOSIS — I10 Essential (primary) hypertension: Secondary | ICD-10-CM

## 2022-08-04 DIAGNOSIS — K029 Dental caries, unspecified: Secondary | ICD-10-CM

## 2022-08-04 DIAGNOSIS — M1A0610 Idiopathic chronic gout, right knee, without tophus (tophi): Secondary | ICD-10-CM

## 2022-08-04 LAB — GLUCOSE, POCT (MANUAL RESULT ENTRY): POC Glucose: 127 mg/dl — AB (ref 70–99)

## 2022-08-04 NOTE — Patient Instructions (Addendum)
See a dentist see recommended dentists that take medicaid No medication refills Labs today Return 5 months

## 2022-08-04 NOTE — Assessment & Plan Note (Signed)
Continue with statin lipids at goal

## 2022-08-04 NOTE — Assessment & Plan Note (Signed)
Referral to [facility] dentist may

## 2022-08-04 NOTE — Assessment & Plan Note (Addendum)
Hypertension at goal continue with carvedilol 25 mg twice daily hydrochlorothiazide daily and check labs

## 2022-08-04 NOTE — Assessment & Plan Note (Signed)
Not having to use cold just seen continue with allopurinol check uric acid

## 2022-08-04 NOTE — Assessment & Plan Note (Addendum)
At goal continue with metformin check urine for albumin

## 2022-08-05 NOTE — Progress Notes (Signed)
Let patient know kidneys are stable no protein in the urine normal kidneys

## 2022-08-07 ENCOUNTER — Telehealth: Payer: Self-pay

## 2022-08-07 NOTE — Telephone Encounter (Signed)
-----   Message from Shan Levans sent at 08/05/2022  3:32 PM EDT ----- Let patient know kidneys are stable no protein in the urine normal kidneys

## 2022-08-07 NOTE — Telephone Encounter (Signed)
Pt was called and vm was left, Information has been sent to nurse pool.   

## 2022-11-02 ENCOUNTER — Other Ambulatory Visit: Payer: Self-pay

## 2023-02-04 ENCOUNTER — Encounter: Payer: Self-pay | Admitting: Family Medicine

## 2023-02-04 ENCOUNTER — Other Ambulatory Visit: Payer: Self-pay

## 2023-02-04 ENCOUNTER — Ambulatory Visit: Payer: Medicaid Other | Attending: Family Medicine | Admitting: Family Medicine

## 2023-02-04 VITALS — BP 134/78 | HR 76 | Ht 59.0 in | Wt 108.4 lb

## 2023-02-04 DIAGNOSIS — Z23 Encounter for immunization: Secondary | ICD-10-CM | POA: Diagnosis not present

## 2023-02-04 DIAGNOSIS — E785 Hyperlipidemia, unspecified: Secondary | ICD-10-CM

## 2023-02-04 DIAGNOSIS — M1A0610 Idiopathic chronic gout, right knee, without tophus (tophi): Secondary | ICD-10-CM

## 2023-02-04 DIAGNOSIS — G629 Polyneuropathy, unspecified: Secondary | ICD-10-CM | POA: Diagnosis not present

## 2023-02-04 DIAGNOSIS — I10 Essential (primary) hypertension: Secondary | ICD-10-CM

## 2023-02-04 DIAGNOSIS — I69054 Hemiplegia and hemiparesis following nontraumatic subarachnoid hemorrhage affecting left non-dominant side: Secondary | ICD-10-CM

## 2023-02-04 DIAGNOSIS — Z8673 Personal history of transient ischemic attack (TIA), and cerebral infarction without residual deficits: Secondary | ICD-10-CM

## 2023-02-04 DIAGNOSIS — Z5986 Financial insecurity: Secondary | ICD-10-CM

## 2023-02-04 DIAGNOSIS — Z7984 Long term (current) use of oral hypoglycemic drugs: Secondary | ICD-10-CM

## 2023-02-04 DIAGNOSIS — E1169 Type 2 diabetes mellitus with other specified complication: Secondary | ICD-10-CM | POA: Diagnosis not present

## 2023-02-04 DIAGNOSIS — Z5941 Food insecurity: Secondary | ICD-10-CM

## 2023-02-04 LAB — POCT GLYCOSYLATED HEMOGLOBIN (HGB A1C): HbA1c, POC (controlled diabetic range): 6.4 % (ref 0.0–7.0)

## 2023-02-04 MED ORDER — CARVEDILOL 25 MG PO TABS
25.0000 mg | ORAL_TABLET | Freq: Two times a day (BID) | ORAL | 1 refills | Status: AC
  Filled 2023-02-04: qty 180, 90d supply, fill #0
  Filled 2023-12-22: qty 180, 90d supply, fill #1

## 2023-02-04 MED ORDER — ATORVASTATIN CALCIUM 40 MG PO TABS
40.0000 mg | ORAL_TABLET | Freq: Every day | ORAL | 1 refills | Status: AC
  Filled 2023-02-04: qty 90, 90d supply, fill #0
  Filled 2023-12-22: qty 90, 90d supply, fill #1

## 2023-02-04 MED ORDER — HYDROCHLOROTHIAZIDE 25 MG PO TABS
25.0000 mg | ORAL_TABLET | Freq: Every day | ORAL | 1 refills | Status: AC
  Filled 2023-02-04: qty 90, 90d supply, fill #0
  Filled 2023-12-22: qty 90, 90d supply, fill #1

## 2023-02-04 MED ORDER — METFORMIN HCL ER 500 MG PO TB24
500.0000 mg | ORAL_TABLET | Freq: Every day | ORAL | 1 refills | Status: AC
  Filled 2023-02-04: qty 90, 90d supply, fill #0
  Filled 2023-12-22: qty 90, 90d supply, fill #1

## 2023-02-04 MED ORDER — GABAPENTIN 300 MG PO CAPS
300.0000 mg | ORAL_CAPSULE | Freq: Two times a day (BID) | ORAL | 1 refills | Status: AC
  Filled 2023-02-04: qty 180, 90d supply, fill #0
  Filled 2023-12-22: qty 180, 90d supply, fill #1

## 2023-02-04 NOTE — Patient Instructions (Signed)
VISIT SUMMARY:  You came in today for a routine follow-up. Your blood pressure and diabetes are well controlled. We discussed your history of stroke, gout, and hearing difficulties. You are due for an eye exam and vaccinations.  YOUR PLAN:  -HYPERTENSION: Hypertension means high blood pressure. Your blood pressure is well controlled with your current medications, so we will continue with the same treatment.  -HISTORY OF STROKE: A stroke occurs when blood flow to a part of your brain is stopped either by a blockage or a burst blood vessel. You have left-sided weakness and hearing impairment on one side, but no changes to your current management are needed.  -DIABETES MELLITUS: Diabetes is a condition where your blood sugar levels are too high. Your diabetes is well controlled with an A1c of 6.4, so we will continue with your current medications.  -GOUT: Gout is a type of arthritis that causes sudden, severe joint pain. Your joint pain is infrequent and you manage it with medication as needed, so we will continue with the same management.  -GENERAL HEALTH MAINTENANCE: For your general health, you are due for an annual diabetic eye exam. We will also administer pneumonia and flu vaccines today.  INSTRUCTIONS:  Please schedule your annual diabetic eye exam. We have administered your pneumonia and flu vaccines today.

## 2023-02-04 NOTE — Progress Notes (Signed)
Subjective:  Patient ID: Anthony Huber, male    DOB: 1960-04-26  Age: 62 y.o. MRN: [id number]  CC: Medical Management of Chronic Issues   HPI Anthony Huber is a 63 y.o. year old male with a history of type 2 diabetes mellitus, hypertension, previous CVA with left-sided weakness, gout.  Interval History: Discussed the use of AI scribe software for clinical note transcription with the patient, who gave verbal consent to proceed.  He presents for a routine follow-up. The patient's blood pressure is well controlled and his diabetes is well managed with an A1c of 6.4.  He is blood pressure is also controlled and he endorses adherence with his antihypertensive and is also doing well on his statin.  The patient has a history of gout and occasionally experiences joint pain, which he manages by taking medication as needed. The frequency of the joint pain is not specified, but it is not frequent and has not occurred since a trip to [place] in November. The patient has difficulty hearing in one ear, which affects his ability to communicate. The patient does not have transportation for regular physical therapy sessions but does walk around his house for exercise. The patient is due for an eye exam and vaccinations.        Past Medical History:  Diagnosis Date   Coronary artery disease    Diabetes mellitus without complication (HCC)    Hypertension     Past Surgical History:  Procedure Laterality Date   BACK SURGERY     BLADDER SURGERY     CORONARY ANGIOPLASTY WITH STENT PLACEMENT     CYSTOSCOPY WITH STENT PLACEMENT Left 09/05/2016   Procedure: CYSTOSCOPY, URETEROSCOPY;  Surgeon: Crist Fat, MD;  Location: [facility];  Service: Urology;  Laterality: Left;   IR NEPHROSTOMY PLACEMENT LEFT  09/06/2016   NEPHROLITHOTOMY Left 09/16/2016   Procedure: NEPHROLITHOTOMY PERCUTANEOUS  LEFT;  Surgeon: Crist Fat, MD;  Location: [facility];  Service: Urology;  Laterality: Left;    History reviewed. No  pertinent family history.  Social History   Socioeconomic History   Marital status: Married    Spouse name: Not on file   Number of children: Not on file   Years of education: Not on file   Highest education level: Not on file  Occupational History   Not on file  Tobacco Use   Smoking status: Former    Current packs/day: 0.00    Types: Cigarettes    Quit date: 2016    Years since quitting: 9.0   Smokeless tobacco: Never  Vaping Use   Vaping status: Never Used  Substance and Sexual Activity   Alcohol use: No   Drug use: No   Sexual activity: Not on file  Other Topics Concern   Not on file  Social History Narrative   Not on file   Social Drivers of Health   Financial Resource Strain: Medium Risk (02/04/2023)   Overall Financial Resource Strain (CARDIA)    Difficulty of Paying Living Expenses: Somewhat hard  Food Insecurity: Food Insecurity Present (02/04/2023)   Hunger Vital Sign    Worried About Running Out of Food in the Last Year: Never true    Ran Out of Food in the Last Year: Sometimes true  Transportation Needs: No Transportation Needs (02/04/2023)   PRAPARE - Administrator, Civil Service (Medical): No    Lack of Transportation (Non-Medical): No  Physical Activity: Insufficiently Active (02/04/2023)   Exercise Vital Sign  Days of Exercise per Week: 7 days    Minutes of Exercise per Session: 20 min  Stress: No Stress Concern Present (02/04/2023)   [employer] of Occupational Health - Occupational Stress Questionnaire    Feeling of Stress : Not at all  Social Connections: Moderately Isolated (02/04/2023)   Social Connection and Isolation Panel [NHANES]    Frequency of Communication with Friends and Family: Never    Frequency of Social Gatherings with Friends and Family: Never    Attends Religious Services: Never    Database administrator or Organizations: Yes    Attends Banker Meetings: Never    Marital Status: Married     Allergies  Allergen Reactions   Amlodipine Other (See Comments)    Leg swelling    Outpatient Medications Prior to Visit  Medication Sig Dispense Refill   acetaminophen (TYLENOL) 325 MG tablet Take 2 tablets (650 mg total) by mouth every 4 (four) hours as needed for mild pain (or temp > 37.5 C (99.5 F)).     allopurinol (ZYLOPRIM) 300 MG tablet Take 1 tablet (300 mg total) by mouth daily. 90 tablet 1   colchicine 0.6 MG tablet Take 1 tablet (0.6 mg total) by mouth daily as needed (gout flare). 60 tablet 0   Multiple Vitamin (MULTIVITAMIN WITH MINERALS) TABS tablet Take 1 tablet by mouth daily.     atorvastatin (LIPITOR) 40 MG tablet Take 1 tablet (40 mg total) by mouth daily. 90 tablet 1   carvedilol (COREG) 25 MG tablet Take 1 tablet (25 mg total) by mouth 2 (two) times daily with a meal. 180 tablet 1   gabapentin (NEURONTIN) 300 MG capsule Take 1 capsule (300 mg total) by mouth 2 (two) times daily. 180 capsule 1   hydrochlorothiazide (HYDRODIURIL) 25 MG tablet Take 1 tablet (25 mg total) by mouth daily. 90 tablet 1   metFORMIN (GLUCOPHAGE-XR) 500 MG 24 hr tablet Take 1 tablet (500 mg total) by mouth daily with breakfast. 90 tablet 1   No facility-administered medications prior to visit.     ROS Review of Systems  Constitutional:  Negative for activity change and appetite change.  HENT:  Negative for sinus pressure and sore throat.   Respiratory:  Negative for chest tightness, shortness of breath and wheezing.   Cardiovascular:  Negative for chest pain and palpitations.  Gastrointestinal:  Negative for abdominal distention, abdominal pain and constipation.  Genitourinary: Negative.   Musculoskeletal: Negative.   Neurological:  Positive for weakness.  Psychiatric/Behavioral:  Negative for behavioral problems and dysphoric mood.     Objective:  BP 134/78   Pulse 76   Ht 4\' 11"  (1.499 m)   Wt 108 lb 6.4 oz (49.2 kg)   SpO2 98%   BMI 21.89 kg/m      02/04/2023   10:33  AM 08/04/2022   10:18 AM 03/05/2022    1:35 PM  BP/Weight  Systolic BP 134 122 128  Diastolic BP 78 79 78  Wt. (Lbs) 108.4 108.4   BMI 21.89 kg/m2 21.89 kg/m2       Physical Exam Constitutional:      Appearance: He is well-developed.  Cardiovascular:     Rate and Rhythm: Normal rate.     Heart sounds: Normal heart sounds. No murmur heard. Pulmonary:     Effort: Pulmonary effort is normal.     Breath sounds: Normal breath sounds. No wheezing or rales.  Chest:     Chest wall: No tenderness.  Abdominal:  General: Bowel sounds are normal. There is no distension.     Palpations: Abdomen is soft. There is no mass.     Tenderness: There is no abdominal tenderness.  Musculoskeletal:        General: Normal range of motion.     Right lower leg: No edema.     Left lower leg: No edema.  Neurological:     Mental Status: He is alert and oriented to person, place, and time.     Comments: Reduced strength and ROM in LEFT UPPER EXTREMITY and LLE  Psychiatric:        Mood and Affect: Mood normal.        Latest Ref Rng & Units 08/04/2022   11:15 AM 01/22/2022   10:41 AM 12/11/2021   10:21 AM  CMP  Glucose 70 - 99 mg/dL 86  81  79   BUN 8 - 27 mg/dL 24  17  23    Creatinine 0.76 - 1.27 mg/dL 1.61  0.96  0.45   Sodium 134 - 144 mmol/L 138  143  140   Potassium 3.5 - 5.2 mmol/L 4.8  4.5  5.0   Chloride 96 - 106 mmol/L 102  104  101   CO2 20 - 29 mmol/L 22  19  23    Calcium 8.6 - 10.2 mg/dL 9.5  9.5  40.9   Total Protein 6.0 - 8.5 g/dL 8.4  7.7    Total Bilirubin 0.0 - 1.2 mg/dL 0.5  0.4    Alkaline Phos 44 - 121 IU/L 109  106    AST 0 - 40 IU/L 18  16    ALT 0 - 44 IU/L 7  5      Lipid Panel     Component Value Date/Time   CHOL 130 03/05/2022 1102   TRIG 102 03/05/2022 1102   HDL 33 (L) 03/05/2022 1102   CHOLHDL 3.9 03/05/2022 1102   CHOLHDL 4.8 04/07/2020 1644   VLDL 48 (H) 04/07/2020 1644   LDLCALC 78 03/05/2022 1102    CBC    Component Value Date/Time   WBC 9.1  07/02/2021 0913   WBC 12.5 (H) 05/16/2020 0540   RBC 5.56 07/02/2021 0913   RBC 5.55 05/16/2020 0540   HGB 12.2 (L) 07/02/2021 0913   HCT 39.6 07/02/2021 0913   PLT 281 07/02/2021 0913   MCV 71 (L) 07/02/2021 0913   MCH 21.9 (L) 07/02/2021 0913   MCH 21.8 (L) 05/16/2020 0540   MCHC 30.8 (L) 07/02/2021 0913   MCHC 31.6 05/16/2020 0540   RDW 17.8 (H) 07/02/2021 0913   LYMPHSABS 2.9 07/02/2021 0913   MONOABS 1.2 (H) 05/16/2020 0540   EOSABS 2.2 (H) 07/02/2021 0913   BASOSABS 0.0 07/02/2021 0913    Lab Results  Component Value Date   HGBA1C 6.4 02/04/2023    Assessment & Plan:      Hypertension Well controlled with current medication regimen. -Continue current medications. -Counseled on blood pressure goal of less than 130/80, low-sodium, DASH diet, medication compliance, 150 minutes of moderate intensity exercise per week. Discussed medication compliance, adverse effects.   History of Stroke Left-sided weakness, no current speech difficulties. Hearing impairment on one side. -Would love for PT but he complains of transportation challenges -No changes to current management.  Type II Diabetes Mellitus Well controlled with an A1c of 6.4. -Continue current medications. -Counseled on Diabetic diet, my plate method, 811 minutes of moderate intensity exercise/week Blood sugar logs with fasting goals of 80-120  mg/dl, random of less than 295 and in the event of sugars less than 60 mg/dl or greater than 621 mg/dl encouraged to notify the clinic. Advised on the need for annual eye exams, annual foot exams, Pneumonia vaccine.   Gout Infrequent joint pain, patient self-manages with medication as needed. -Continue current management.  General Health Maintenance -Refer for annual diabetic eye exam. -Administer pneumonia and flu vaccines today.          Meds ordered this encounter  Medications   atorvastatin (LIPITOR) 40 MG tablet    Sig: Take 1 tablet (40 mg total) by  mouth daily.    Dispense:  90 tablet    Refill:  1   carvedilol (COREG) 25 MG tablet    Sig: Take 1 tablet (25 mg total) by mouth 2 (two) times daily with a meal.    Dispense:  180 tablet    Refill:  1   gabapentin (NEURONTIN) 300 MG capsule    Sig: Take 1 capsule (300 mg total) by mouth 2 (two) times daily.    Dispense:  180 capsule    Refill:  1   hydrochlorothiazide (HYDRODIURIL) 25 MG tablet    Sig: Take 1 tablet (25 mg total) by mouth daily.    Dispense:  90 tablet    Refill:  1   metFORMIN (GLUCOPHAGE-XR) 500 MG 24 hr tablet    Sig: Take 1 tablet (500 mg total) by mouth daily with breakfast.    Dispense:  90 tablet    Refill:  1    Follow-up: Return in about 6 months (around 08/04/2023) for Chronic medical conditions.       Hoy Register, MD, FAAFP. [facility] [place], [place] [phone]   02/04/2023, 12:11 PM

## 2023-02-05 LAB — CMP14+EGFR
ALT: 10 [IU]/L (ref 0–44)
AST: 33 [IU]/L (ref 0–40)
Albumin: 3.7 g/dL — ABNORMAL LOW (ref 3.9–4.9)
Alkaline Phosphatase: 99 [IU]/L (ref 44–121)
BUN/Creatinine Ratio: 14 (ref 10–24)
BUN: 21 mg/dL (ref 8–27)
Bilirubin Total: 0.3 mg/dL (ref 0.0–1.2)
CO2: 16 mmol/L — ABNORMAL LOW (ref 20–29)
Calcium: 9.3 mg/dL (ref 8.6–10.2)
Chloride: 106 mmol/L (ref 96–106)
Creatinine, Ser: 1.54 mg/dL — ABNORMAL HIGH (ref 0.76–1.27)
Globulin, Total: 4.6 g/dL — ABNORMAL HIGH (ref 1.5–4.5)
Glucose: 148 mg/dL — ABNORMAL HIGH (ref 70–99)
Sodium: 139 mmol/L (ref 134–144)
Total Protein: 8.3 g/dL (ref 6.0–8.5)
eGFR: 50 mL/min/{1.73_m2} — ABNORMAL LOW (ref >59)

## 2023-02-08 ENCOUNTER — Other Ambulatory Visit: Payer: Self-pay

## 2023-05-03 ENCOUNTER — Ambulatory Visit: Payer: Self-pay

## 2023-05-03 NOTE — Telephone Encounter (Signed)
 Noted.

## 2023-05-03 NOTE — Telephone Encounter (Signed)
 pt son states that his mom told him yesterday that his dad was having chest pain and not eating. Son states that he doesn't know how long or if currently having chest.  NT asked son to conference call patient and son states that they only have one phone and wont hardly answer.  Instructed son that he is his having chest pain he needs to go to ED but NT will attempt to contact pt. Using [employer] ID# [id number]. Attempting to contact pt to get more info for better triage now answer.    Copied from CRM [id number]. Topic: Clinical - Red Word Triage >> May 03, 2023  8:47 AM Oddis Bench wrote: Red Word that prompted transfer to Nurse Triage: Patient is having chest pain, patients son Raynette Cam is on the line.

## 2023-05-03 NOTE — Telephone Encounter (Signed)
 Patient's son is returning a call to NT. Per previous encounters, it appears NT was attempting to get in contact with the patient for additional questions/more detailed assessment. Son stated his personal number must be attached to the chart because he was the one that got the voicemail, not the patient. This RN asked son if the patient was available for a call at this time. Son declined and stated that the patient is not available and his mother's phone is not currently working. Son stated patient has been dealing with chest pain that makes it difficult to breathe, but could not elaborate on symptoms. This RN advised ED if the patient is experiencing chest pain and difficulty breathing. Son complied.   Reason for Disposition  Difficulty breathing  Protocols used: Chest Pain-A-AH

## 2023-05-03 NOTE — Telephone Encounter (Signed)
 05/03/23-2nd attempt Using interpreter Ris[employer] ID#[id number] , no answer, left vmail with callback number [phone].

## 2023-12-22 ENCOUNTER — Other Ambulatory Visit: Payer: Self-pay
